# Patient Record
Sex: Male | Born: 1940 | Race: White | Hispanic: No | Marital: Single | Smoking: Current some day smoker
Health system: Southern US, Community
[De-identification: ages and names within clinical notes are randomized; demographics above are authoritative.]

## PROBLEM LIST (undated history)

## (undated) DIAGNOSIS — C3490 Malignant neoplasm of unspecified part of unspecified bronchus or lung: Secondary | ICD-10-CM

## (undated) DIAGNOSIS — C189 Malignant neoplasm of colon, unspecified: Secondary | ICD-10-CM

## (undated) DIAGNOSIS — I10 Essential (primary) hypertension: Secondary | ICD-10-CM

## (undated) DIAGNOSIS — E119 Type 2 diabetes mellitus without complications: Secondary | ICD-10-CM

## (undated) HISTORY — PX: COLOSTOMY: SHX63

## (undated) HISTORY — PX: JOINT REPLACEMENT: SHX530

---

## 2010-06-13 ENCOUNTER — Ambulatory Visit: Payer: Self-pay | Admitting: Family Medicine

## 2019-09-14 ENCOUNTER — Inpatient Hospital Stay
Admission: EM | Admit: 2019-09-14 | Discharge: 2019-09-18 | DRG: 481 | Disposition: A | Discharge status: Skilled Nursing Facility | Payer: Medicare Other | Attending: Internal Medicine | Admitting: Internal Medicine

## 2019-09-14 ENCOUNTER — Emergency Department: Payer: Medicare Other

## 2019-09-14 ENCOUNTER — Other Ambulatory Visit: Payer: Self-pay

## 2019-09-14 DIAGNOSIS — E876 Hypokalemia: Secondary | ICD-10-CM | POA: Diagnosis present

## 2019-09-14 DIAGNOSIS — Z20822 Contact with and (suspected) exposure to covid-19: Secondary | ICD-10-CM | POA: Diagnosis present

## 2019-09-14 DIAGNOSIS — N182 Chronic kidney disease, stage 2 (mild): Secondary | ICD-10-CM | POA: Diagnosis present

## 2019-09-14 DIAGNOSIS — W1831XA Fall on same level due to stepping on an object, initial encounter: Secondary | ICD-10-CM | POA: Diagnosis present

## 2019-09-14 DIAGNOSIS — I440 Atrioventricular block, first degree: Secondary | ICD-10-CM | POA: Diagnosis present

## 2019-09-14 DIAGNOSIS — Z9181 History of falling: Secondary | ICD-10-CM

## 2019-09-14 DIAGNOSIS — S72142A Displaced intertrochanteric fracture of left femur, initial encounter for closed fracture: Secondary | ICD-10-CM

## 2019-09-14 DIAGNOSIS — S72009A Fracture of unspecified part of neck of unspecified femur, initial encounter for closed fracture: Secondary | ICD-10-CM

## 2019-09-14 DIAGNOSIS — E119 Type 2 diabetes mellitus without complications: Secondary | ICD-10-CM | POA: Diagnosis not present

## 2019-09-14 DIAGNOSIS — Z96653 Presence of artificial knee joint, bilateral: Secondary | ICD-10-CM | POA: Diagnosis present

## 2019-09-14 DIAGNOSIS — T465X6A Underdosing of other antihypertensive drugs, initial encounter: Secondary | ICD-10-CM | POA: Diagnosis present

## 2019-09-14 DIAGNOSIS — W010XXA Fall on same level from slipping, tripping and stumbling without subsequent striking against object, initial encounter: Secondary | ICD-10-CM | POA: Diagnosis present

## 2019-09-14 DIAGNOSIS — F17200 Nicotine dependence, unspecified, uncomplicated: Secondary | ICD-10-CM | POA: Diagnosis present

## 2019-09-14 DIAGNOSIS — N179 Acute kidney failure, unspecified: Secondary | ICD-10-CM | POA: Diagnosis present

## 2019-09-14 DIAGNOSIS — F1020 Alcohol dependence, uncomplicated: Secondary | ICD-10-CM | POA: Diagnosis present

## 2019-09-14 DIAGNOSIS — Z91138 Patient's unintentional underdosing of medication regimen for other reason: Secondary | ICD-10-CM

## 2019-09-14 DIAGNOSIS — Y92019 Unspecified place in single-family (private) house as the place of occurrence of the external cause: Secondary | ICD-10-CM | POA: Diagnosis not present

## 2019-09-14 DIAGNOSIS — Z96641 Presence of right artificial hip joint: Secondary | ICD-10-CM | POA: Diagnosis present

## 2019-09-14 DIAGNOSIS — I10 Essential (primary) hypertension: Secondary | ICD-10-CM | POA: Diagnosis not present

## 2019-09-14 DIAGNOSIS — N1831 Chronic kidney disease, stage 3a: Secondary | ICD-10-CM | POA: Diagnosis present

## 2019-09-14 DIAGNOSIS — D7589 Other specified diseases of blood and blood-forming organs: Secondary | ICD-10-CM | POA: Diagnosis present

## 2019-09-14 DIAGNOSIS — Y9389 Activity, other specified: Secondary | ICD-10-CM | POA: Diagnosis not present

## 2019-09-14 DIAGNOSIS — S72002A Fracture of unspecified part of neck of left femur, initial encounter for closed fracture: Secondary | ICD-10-CM | POA: Diagnosis present

## 2019-09-14 DIAGNOSIS — D696 Thrombocytopenia, unspecified: Secondary | ICD-10-CM | POA: Diagnosis present

## 2019-09-14 DIAGNOSIS — Z419 Encounter for procedure for purposes other than remedying health state, unspecified: Secondary | ICD-10-CM

## 2019-09-14 DIAGNOSIS — I129 Hypertensive chronic kidney disease with stage 1 through stage 4 chronic kidney disease, or unspecified chronic kidney disease: Secondary | ICD-10-CM | POA: Diagnosis present

## 2019-09-14 DIAGNOSIS — Z833 Family history of diabetes mellitus: Secondary | ICD-10-CM

## 2019-09-14 DIAGNOSIS — E1122 Type 2 diabetes mellitus with diabetic chronic kidney disease: Secondary | ICD-10-CM | POA: Diagnosis present

## 2019-09-14 DIAGNOSIS — W19XXXA Unspecified fall, initial encounter: Secondary | ICD-10-CM

## 2019-09-14 HISTORY — DX: Type 2 diabetes mellitus without complications: E11.9

## 2019-09-14 HISTORY — DX: Essential (primary) hypertension: I10

## 2019-09-14 LAB — COMPREHENSIVE METABOLIC PANEL
ALT: 17 U/L (ref 0–44)
AST: 29 U/L (ref 15–41)
Albumin: 3.5 g/dL (ref 3.5–5.0)
Alkaline Phosphatase: 71 U/L (ref 38–126)
Anion gap: 13 (ref 5–15)
BUN: 10 mg/dL (ref 8–23)
CO2: 28 mmol/L (ref 22–32)
Calcium: 8.3 mg/dL — ABNORMAL LOW (ref 8.9–10.3)
Chloride: 98 mmol/L (ref 98–111)
Creatinine, Ser: 1.41 mg/dL — ABNORMAL HIGH (ref 0.61–1.24)
GFR calc Af Amer: 55 mL/min — ABNORMAL LOW (ref >60)
GFR calc non Af Amer: 47 mL/min — ABNORMAL LOW (ref >60)
Glucose, Bld: 194 mg/dL — ABNORMAL HIGH (ref 70–99)
Potassium: 2.6 mmol/L — CL (ref 3.5–5.1)
Sodium: 139 mmol/L (ref 135–145)
Total Bilirubin: 0.8 mg/dL (ref 0.3–1.2)
Total Protein: 7.1 g/dL (ref 6.5–8.1)

## 2019-09-14 LAB — CBC WITH DIFFERENTIAL/PLATELET
Abs Immature Granulocytes: 0.02 10*3/uL (ref 0.00–0.07)
Basophils Absolute: 0.1 10*3/uL (ref 0.0–0.1)
Basophils Relative: 1 %
Eosinophils Absolute: 0.2 10*3/uL (ref 0.0–0.5)
Eosinophils Relative: 3 %
HCT: 39.4 % (ref 39.0–52.0)
Hemoglobin: 13.6 g/dL (ref 13.0–17.0)
Immature Granulocytes: 0 %
Lymphocytes Relative: 21 %
Lymphs Abs: 1.1 10*3/uL (ref 0.7–4.0)
MCH: 35.1 pg — ABNORMAL HIGH (ref 26.0–34.0)
MCHC: 34.5 g/dL (ref 30.0–36.0)
MCV: 101.8 fL — ABNORMAL HIGH (ref 80.0–100.0)
Monocytes Absolute: 0.5 10*3/uL (ref 0.1–1.0)
Monocytes Relative: 9 %
Neutro Abs: 3.6 10*3/uL (ref 1.7–7.7)
Neutrophils Relative %: 66 %
Platelets: 142 10*3/uL — ABNORMAL LOW (ref 150–400)
RBC: 3.87 MIL/uL — ABNORMAL LOW (ref 4.22–5.81)
RDW: 14.2 % (ref 11.5–15.5)
WBC: 5.5 10*3/uL (ref 4.0–10.5)
nRBC: 0 % (ref 0.0–0.2)

## 2019-09-14 LAB — TYPE AND SCREEN
ABO/RH(D): A POS
Antibody Screen: NEGATIVE

## 2019-09-14 LAB — PROTIME-INR
INR: 1 (ref 0.8–1.2)
Prothrombin Time: 13.2 seconds (ref 11.4–15.2)

## 2019-09-14 LAB — SARS CORONAVIRUS 2 BY RT PCR (HOSPITAL ORDER, PERFORMED IN ~~LOC~~ HOSPITAL LAB): SARS Coronavirus 2: NEGATIVE

## 2019-09-14 IMAGING — DX DG CHEST 1V PORT
1 series · 1 of 1 positions shown · non-contrast
Comparison: None.

CLINICAL DATA: 79-year-old male status post fall at home. Left
femur fracture.

EXAM:
PORTABLE CHEST 1 VIEW

[chest ap]
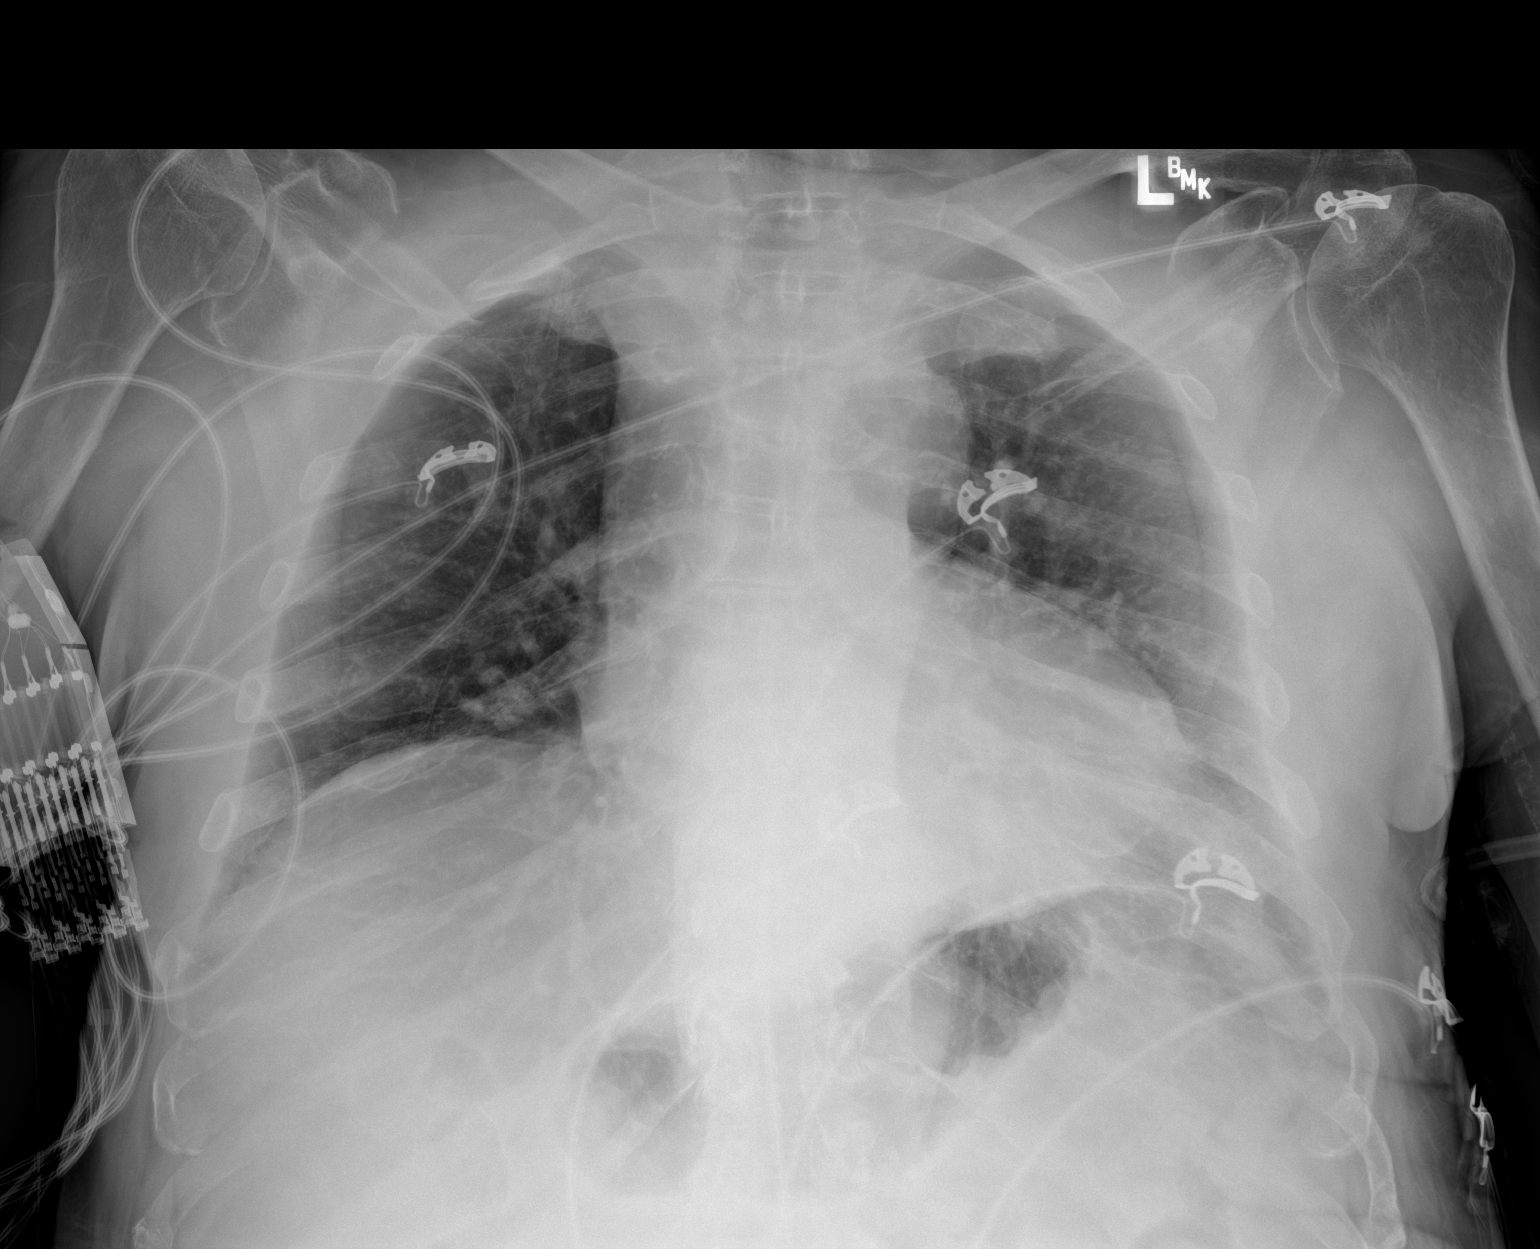

[1 of 1 positions shown; findings below may reference images not displayed]

FINDINGS: Portable AP semi upright view at [Q9] hours. Lordotic view. Mild
cardiomegaly. Mildly tortuous thoracic aorta. Visualized tracheal
air column is within normal limits. Allowing for portable technique
the lungs are clear. Negative visible bowel gas pattern. No acute
osseous abnormality identified.
IMPRESSION: Mild cardiomegaly.  No acute cardiopulmonary abnormality.

## 2019-09-14 IMAGING — DX DG HIP (WITH OR WITHOUT PELVIS) 2-3V*L*
4 series · 4 of 4 positions shown · non-contrast
Comparison: None.

CLINICAL DATA: 79-year-old male status post fall at home.

EXAM:
DG HIP (WITH OR WITHOUT PELVIS) 2-3V LEFT;
LEFT FEMUR 2 VIEWS

[pelvis ap]
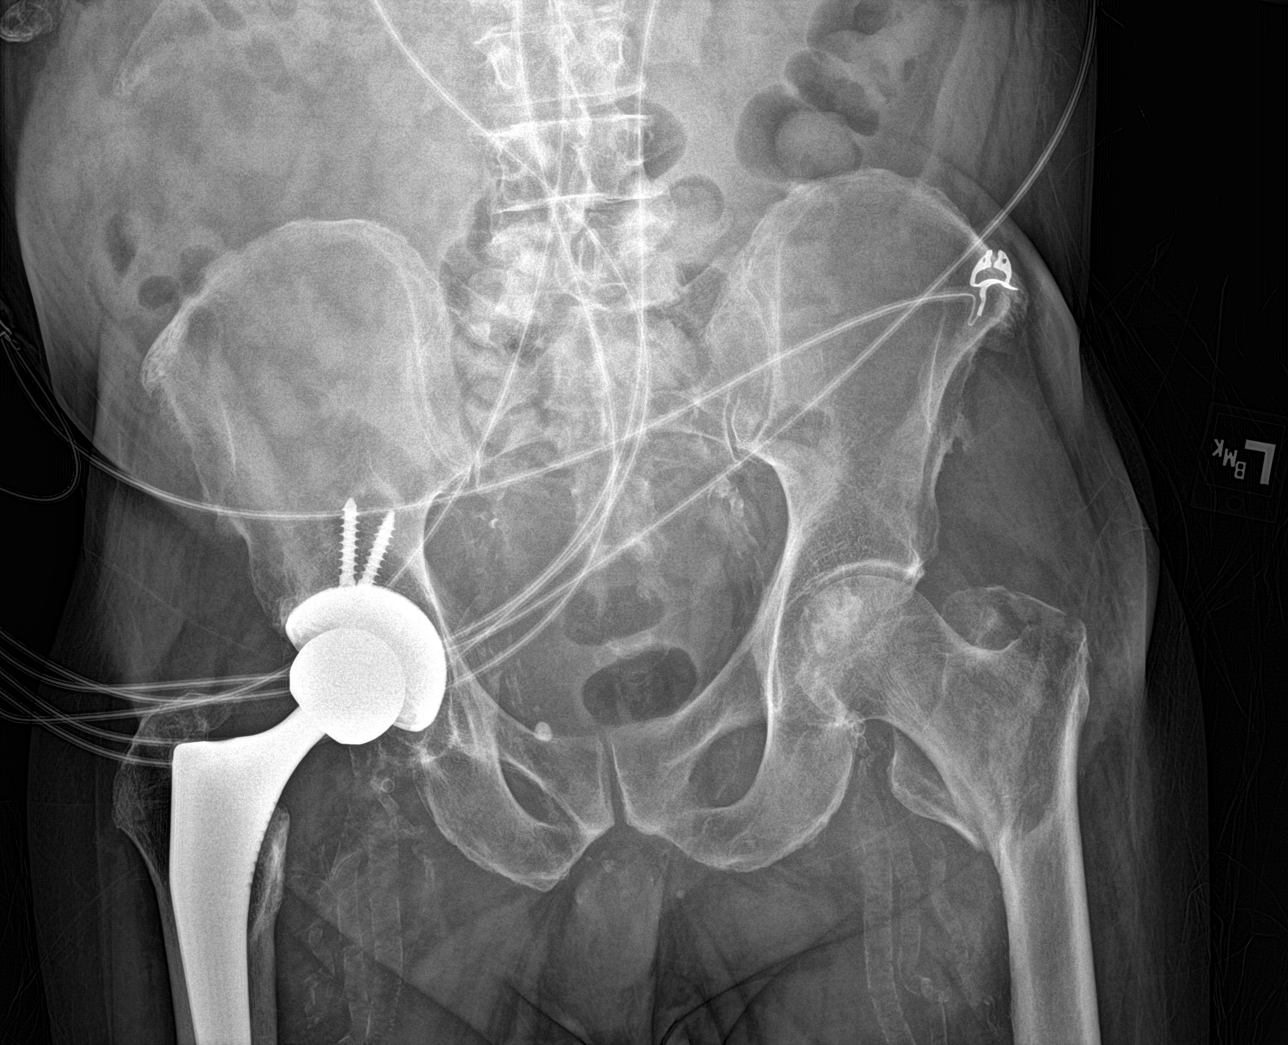

[hip ap]
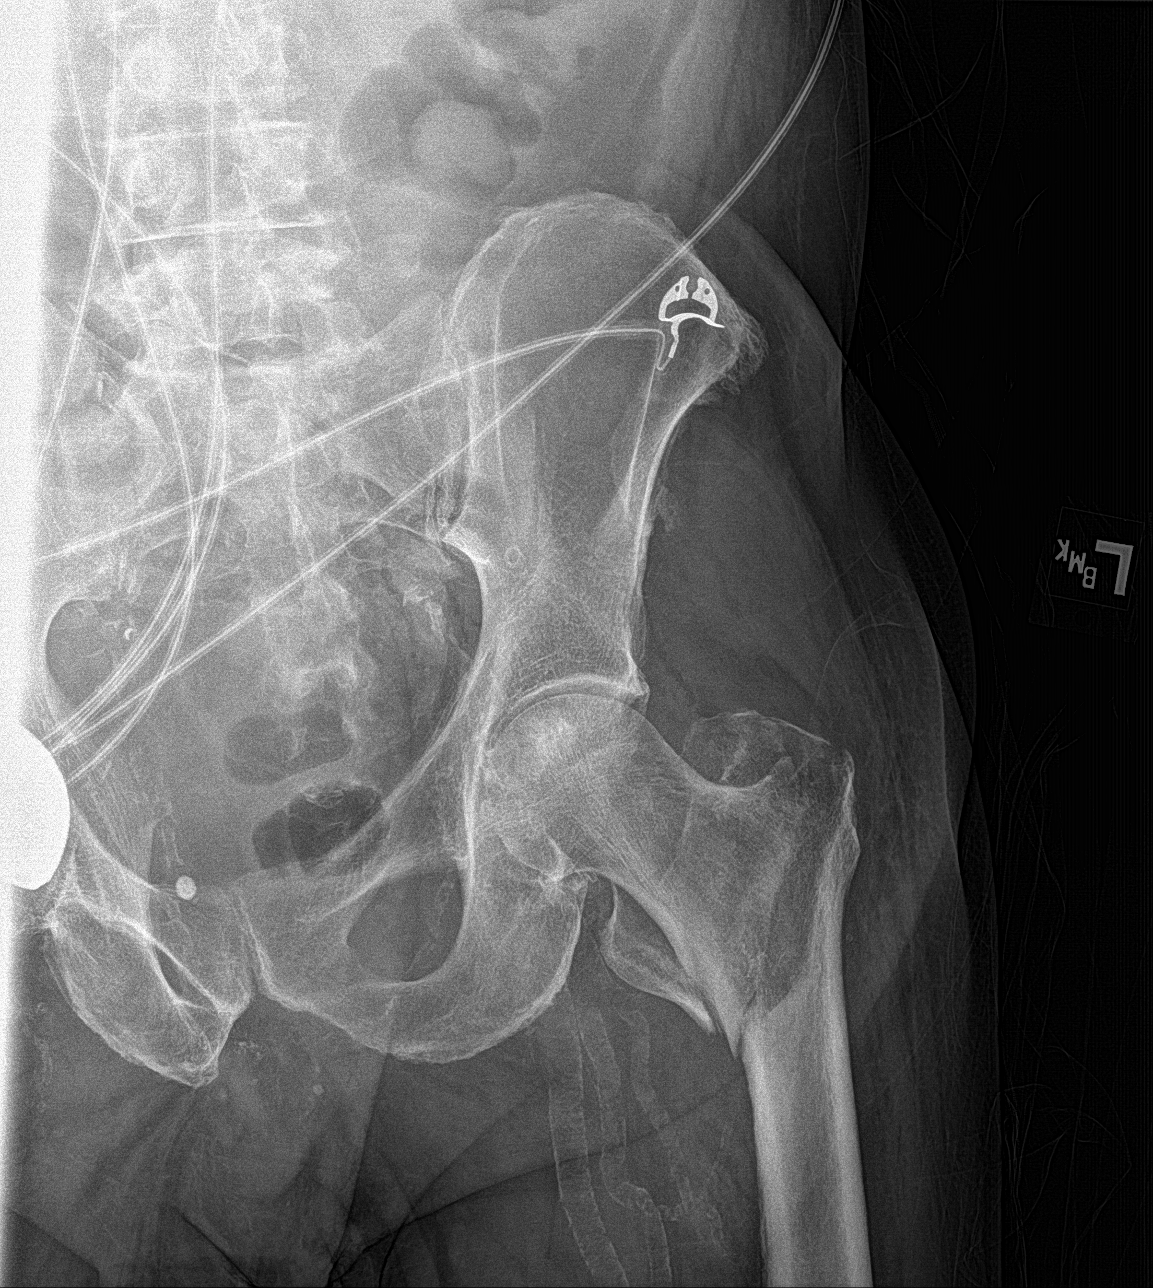

[femur lat]
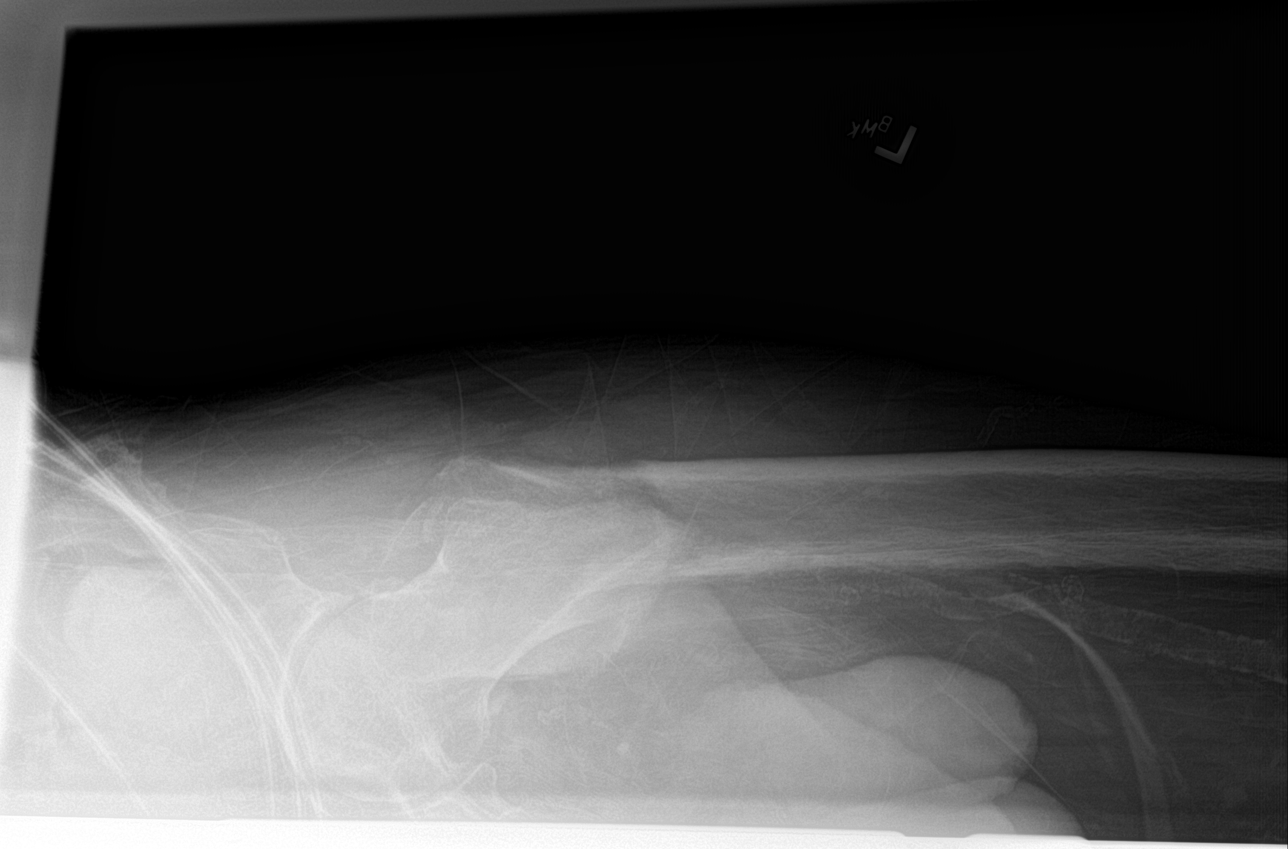

[femur ap]
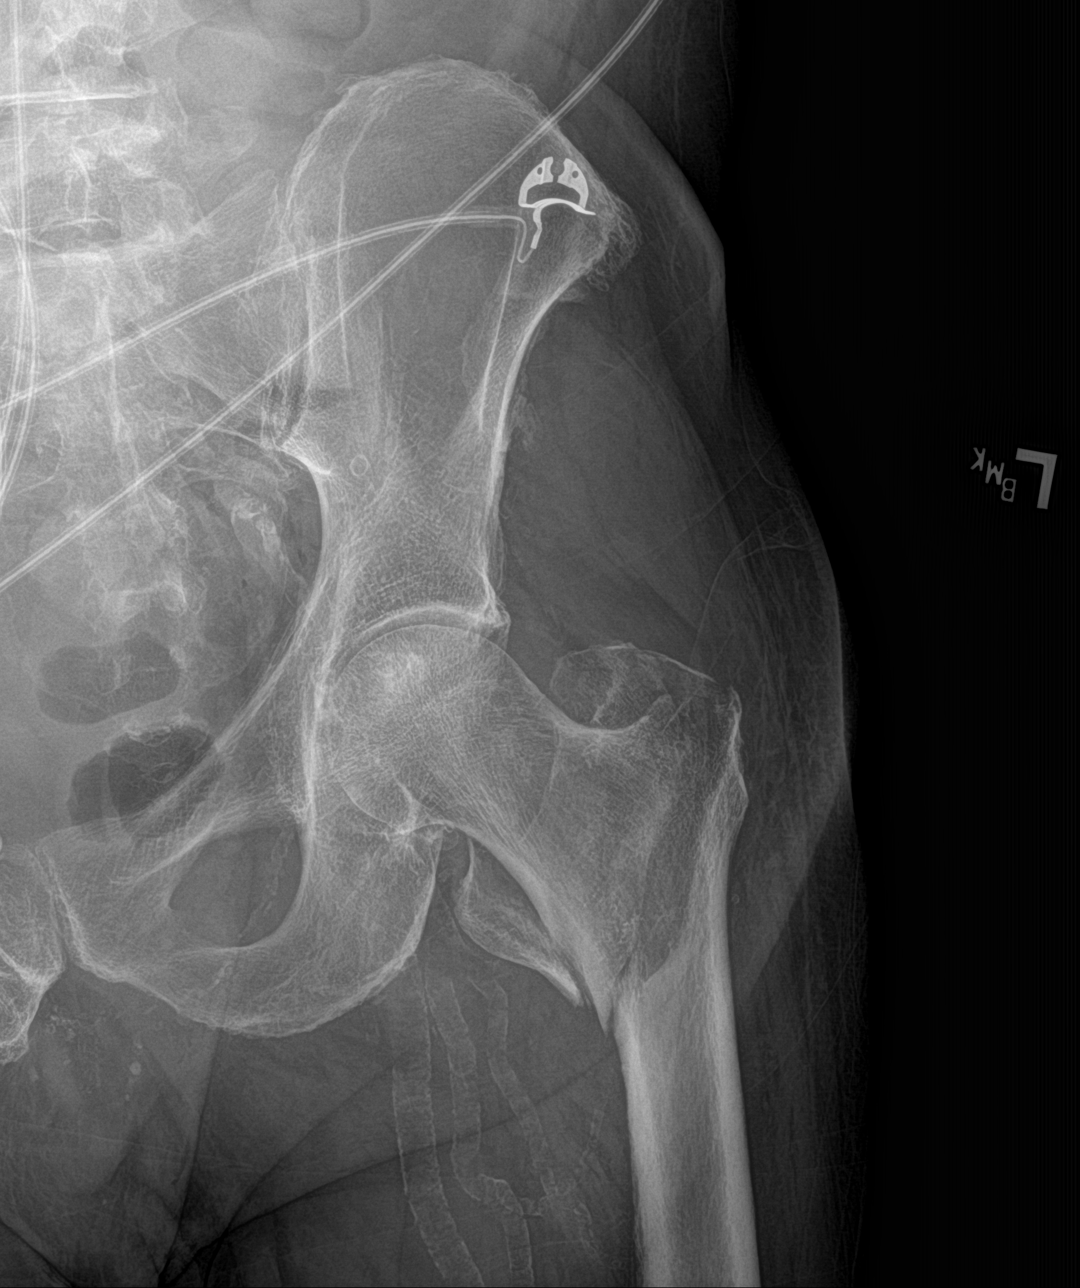

[4 of 4 positions shown; findings below may reference images not displayed]

FINDINGS: Left hip:

Comminuted left femur intertrochanteric fracture with mild varus
impaction. Left femoral head remains normally located.

Superimposed right total hip arthroplasty. No pelvis fracture
identified. Grossly intact visible proximal femur. Superimposed
iliofemoral calcified atherosclerosis. Negative visible lower
abdominal and pelvic visceral contours.

Left femur:

Distal to the intertrochanteric segment the left femur appears
intact. Superimposed left total knee arthroplasty appears aligned.
Calcified peripheral vascular disease in the left lower extremity.
IMPRESSION: 1. Comminuted left femur intertrochanteric fracture with mild varus
impaction.
2. No other acute fracture or dislocation identified about the left
femur or pelvis.
3. Right total hip arthroplasty, left knee arthroplasty.
4. Calcified atherosclerosis, peripheral vascular disease.

## 2019-09-14 IMAGING — DX DG FEMUR 2+V*L*
7 series · 7 of 7 positions shown · non-contrast
Comparison: None.

CLINICAL DATA: 79-year-old male status post fall at home.

EXAM:
DG HIP (WITH OR WITHOUT PELVIS) 2-3V LEFT;
LEFT FEMUR 2 VIEWS

[femur ap (1 of 3)]
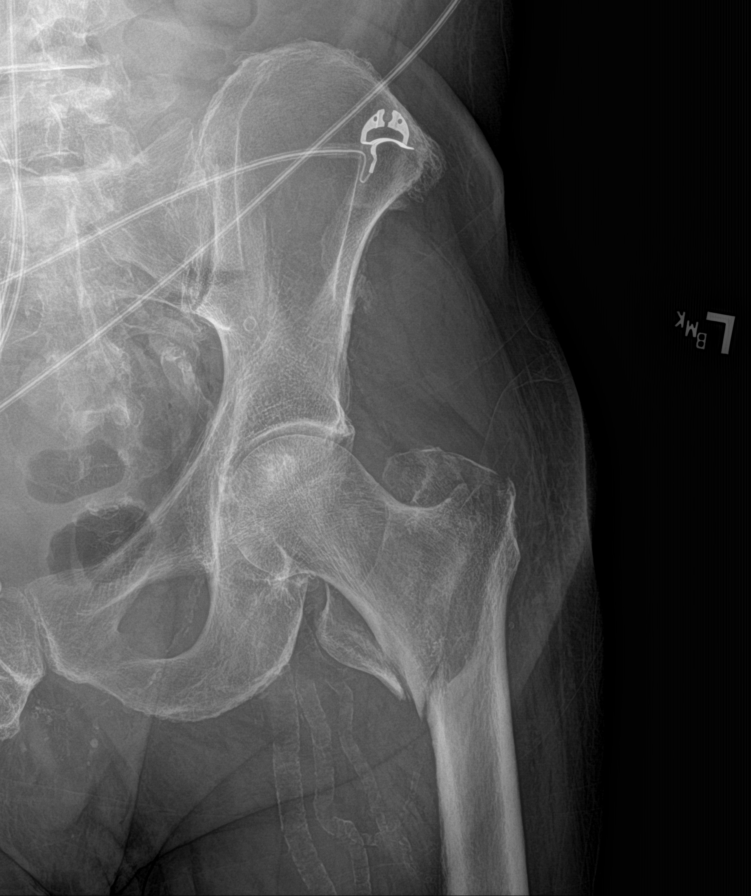

[femur ap (2 of 3)]
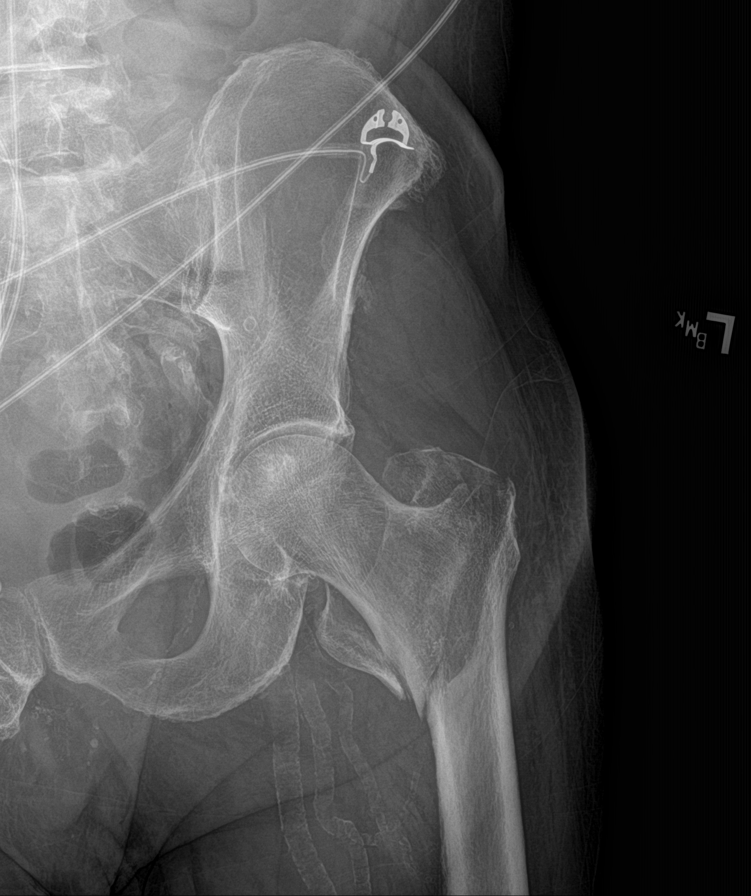

[femur ap (3 of 3)]
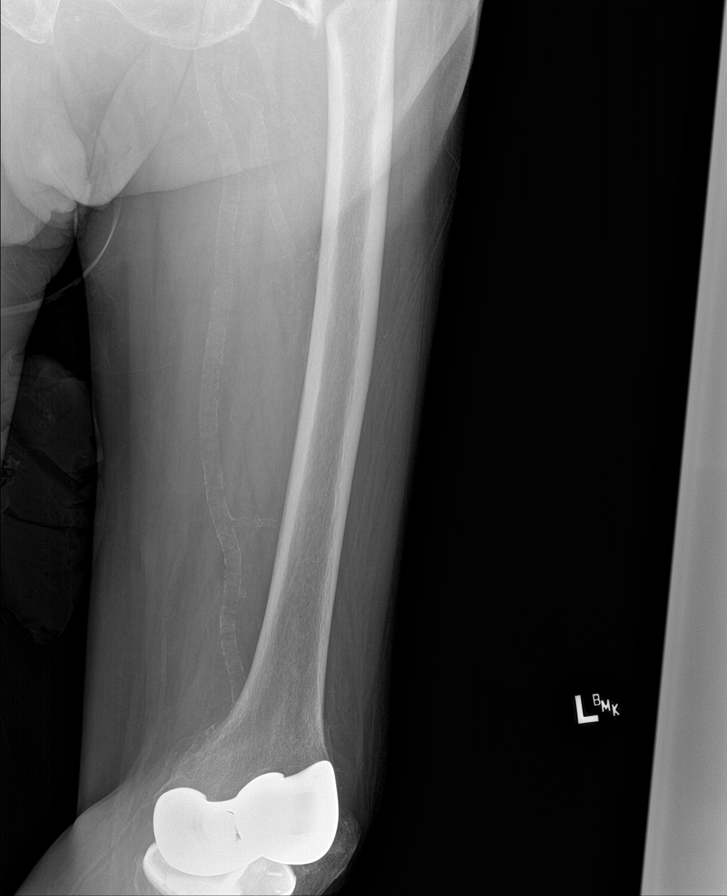

[femur lat (1 of 4)]
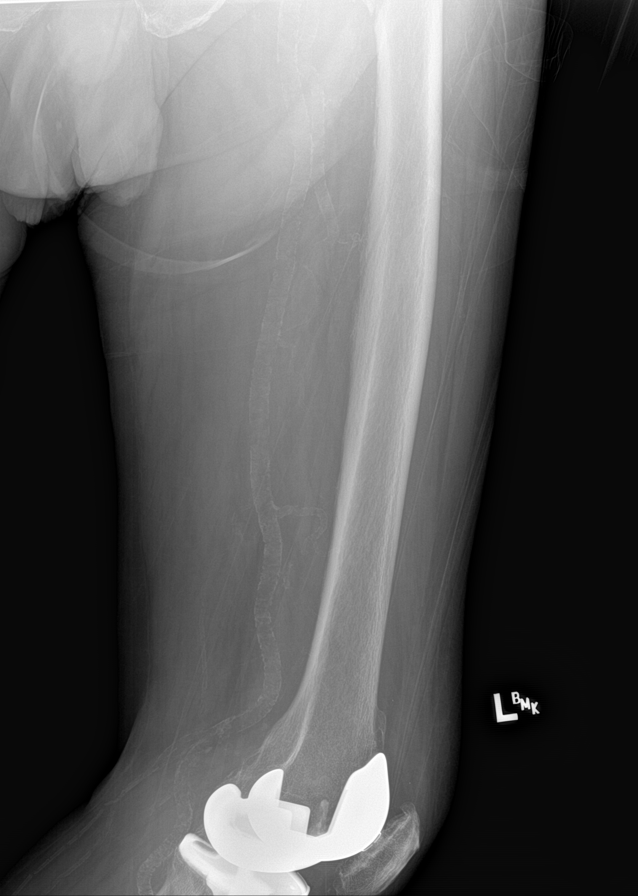

[femur lat (2 of 4)]
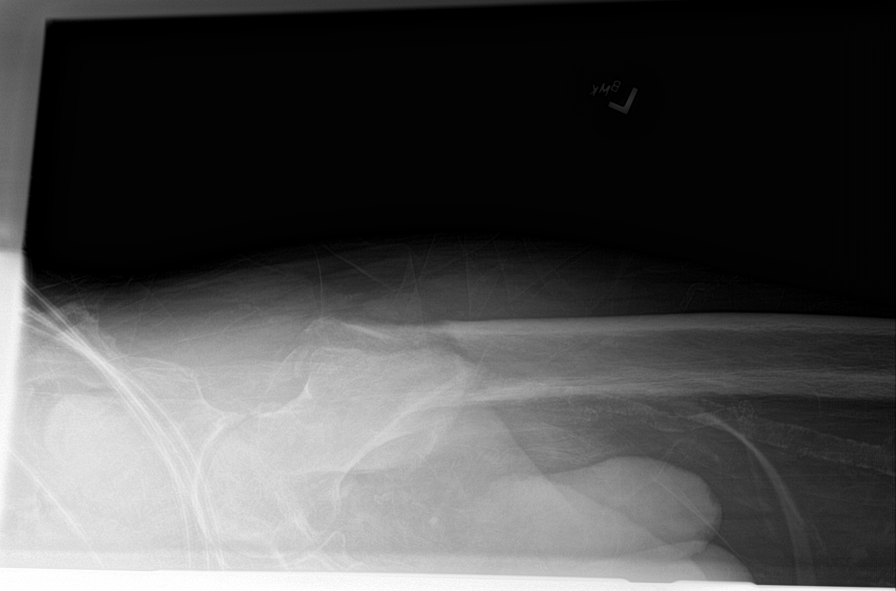

[femur lat (3 of 4)]
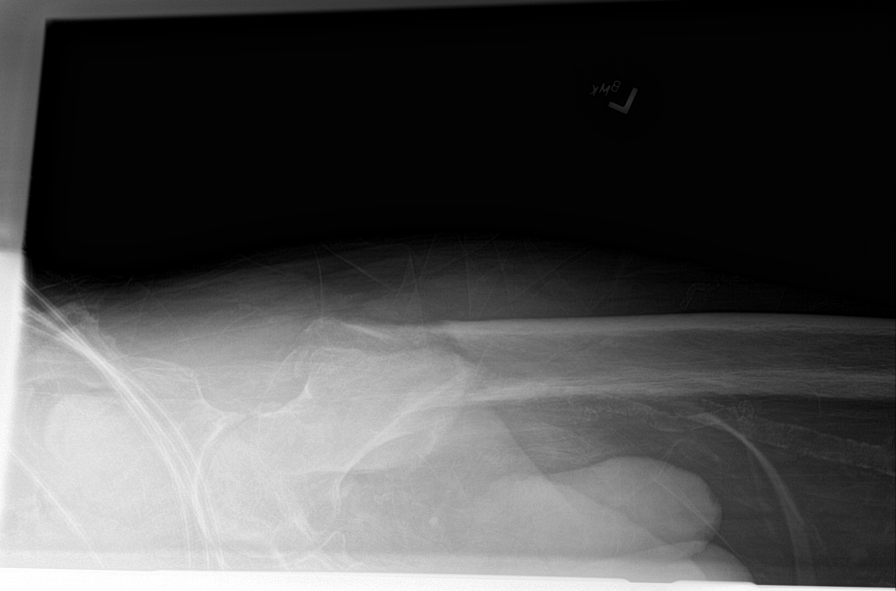

[femur lat (4 of 4)]
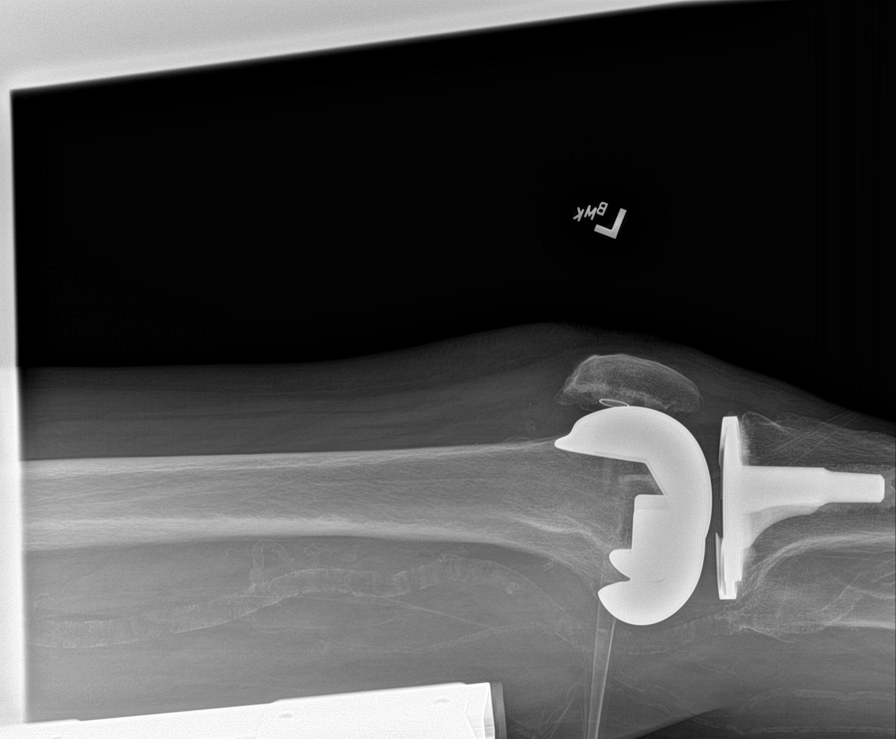

[7 of 7 positions shown; findings below may reference images not displayed]

FINDINGS: Left hip:

Comminuted left femur intertrochanteric fracture with mild varus
impaction. Left femoral head remains normally located.

Superimposed right total hip arthroplasty. No pelvis fracture
identified. Grossly intact visible proximal femur. Superimposed
iliofemoral calcified atherosclerosis. Negative visible lower
abdominal and pelvic visceral contours.

Left femur:

Distal to the intertrochanteric segment the left femur appears
intact. Superimposed left total knee arthroplasty appears aligned.
Calcified peripheral vascular disease in the left lower extremity.
IMPRESSION: 1. Comminuted left femur intertrochanteric fracture with mild varus
impaction.
2. No other acute fracture or dislocation identified about the left
femur or pelvis.
3. Right total hip arthroplasty, left knee arthroplasty.
4. Calcified atherosclerosis, peripheral vascular disease.

## 2019-09-14 MED ORDER — ACETAMINOPHEN 500 MG PO TABS
1000.0000 mg | ORAL_TABLET | Freq: Once | ORAL | Status: AC
  Administered 2019-09-14: 1000 mg via ORAL
  Filled 2019-09-14: qty 2

## 2019-09-14 MED ORDER — POTASSIUM CHLORIDE CRYS ER 20 MEQ PO TBCR
40.0000 meq | EXTENDED_RELEASE_TABLET | Freq: Once | ORAL | Status: AC
  Administered 2019-09-14: 40 meq via ORAL
  Filled 2019-09-14: qty 2

## 2019-09-14 MED ORDER — FENTANYL CITRATE (PF) 100 MCG/2ML IJ SOLN: 50.0000 ug | Freq: Once | INTRAMUSCULAR | Status: AC

## 2019-09-14 MED ORDER — ONDANSETRON HCL 4 MG/2ML IJ SOLN: 4.0000 mg | Freq: Once | INTRAMUSCULAR | Status: AC

## 2019-09-14 MED ORDER — FENTANYL CITRATE (PF) 100 MCG/2ML IJ SOLN
INTRAMUSCULAR | Status: AC
  Administered 2019-09-14: 50 ug via INTRAVENOUS
  Filled 2019-09-14: qty 2

## 2019-09-14 MED ORDER — THIAMINE HCL 100 MG/ML IJ SOLN
100.0000 mg | Freq: Every day | INTRAMUSCULAR | Status: DC
  Filled 2019-09-14: qty 2

## 2019-09-14 MED ORDER — ONDANSETRON HCL 4 MG/2ML IJ SOLN: 4.0000 mg | Freq: Four times a day (QID) | INTRAMUSCULAR | Status: DC | PRN

## 2019-09-14 MED ORDER — LORAZEPAM 1 MG PO TABS: 1.0000 mg | ORAL_TABLET | ORAL | Status: AC | PRN

## 2019-09-14 MED ORDER — LORAZEPAM 2 MG/ML IJ SOLN: 1.0000 mg | INTRAMUSCULAR | Status: AC | PRN

## 2019-09-14 MED ORDER — THIAMINE HCL 100 MG PO TABS
100.0000 mg | ORAL_TABLET | Freq: Every day | ORAL | Status: DC
  Administered 2019-09-15 – 2019-09-18 (×4): 100 mg via ORAL
  Filled 2019-09-14 (×4): qty 1

## 2019-09-14 MED ORDER — INSULIN ASPART 100 UNIT/ML ~~LOC~~ SOLN
0.0000 [IU] | SUBCUTANEOUS | Status: DC
  Administered 2019-09-15 (×2): 1 [IU] via SUBCUTANEOUS
  Administered 2019-09-15: 2 [IU] via SUBCUTANEOUS
  Administered 2019-09-16: 5 [IU] via SUBCUTANEOUS
  Administered 2019-09-16: 2 [IU] via SUBCUTANEOUS
  Administered 2019-09-16 (×2): 1 [IU] via SUBCUTANEOUS
  Administered 2019-09-16: 2 [IU] via SUBCUTANEOUS
  Administered 2019-09-16: 3 [IU] via SUBCUTANEOUS
  Administered 2019-09-17: 1 [IU] via SUBCUTANEOUS
  Administered 2019-09-17 (×2): 2 [IU] via SUBCUTANEOUS
  Administered 2019-09-17: 3 [IU] via SUBCUTANEOUS
  Filled 2019-09-14 (×13): qty 1

## 2019-09-14 MED ORDER — CEFAZOLIN SODIUM-DEXTROSE 1-4 GM/50ML-% IV SOLN
1.0000 g | Freq: Once | INTRAVENOUS | Status: AC
  Administered 2019-09-15: 2 g via INTRAVENOUS
  Filled 2019-09-14: qty 50

## 2019-09-14 MED ORDER — LABETALOL HCL 5 MG/ML IV SOLN: 10.0000 mg | INTRAVENOUS | Status: DC | PRN

## 2019-09-14 MED ORDER — FOLIC ACID 1 MG PO TABS
1.0000 mg | ORAL_TABLET | Freq: Every day | ORAL | Status: DC
  Administered 2019-09-15 – 2019-09-18 (×4): 1 mg via ORAL
  Filled 2019-09-14 (×4): qty 1

## 2019-09-14 MED ORDER — ADULT MULTIVITAMIN W/MINERALS CH
1.0000 | ORAL_TABLET | Freq: Every day | ORAL | Status: DC
  Administered 2019-09-15 – 2019-09-18 (×4): 1 via ORAL
  Filled 2019-09-14 (×4): qty 1

## 2019-09-14 MED ORDER — HYDROMORPHONE HCL 1 MG/ML IJ SOLN
1.0000 mg | Freq: Once | INTRAMUSCULAR | Status: AC
  Administered 2019-09-14: 1 mg via INTRAVENOUS
  Filled 2019-09-14: qty 1

## 2019-09-14 MED ORDER — SENNOSIDES-DOCUSATE SODIUM 8.6-50 MG PO TABS: 1.0000 | ORAL_TABLET | Freq: Every evening | ORAL | Status: DC | PRN

## 2019-09-14 MED ORDER — MAGNESIUM SULFATE IN D5W 1-5 GM/100ML-% IV SOLN
1.0000 g | Freq: Once | INTRAVENOUS | Status: AC
  Administered 2019-09-15: 1 g via INTRAVENOUS
  Filled 2019-09-14: qty 100

## 2019-09-14 MED ORDER — POTASSIUM CHLORIDE IN NACL 40-0.9 MEQ/L-% IV SOLN
INTRAVENOUS | Status: DC
  Filled 2019-09-14 (×2): qty 1000

## 2019-09-14 MED ORDER — LACTATED RINGERS IV BOLUS
1000.0000 mL | Freq: Once | INTRAVENOUS | Status: AC
  Administered 2019-09-14: 1000 mL via INTRAVENOUS

## 2019-09-14 MED ORDER — MORPHINE SULFATE (PF) 2 MG/ML IV SOLN
1.0000 mg | INTRAVENOUS | Status: DC | PRN
  Administered 2019-09-14 – 2019-09-15 (×2): 2 mg via INTRAVENOUS
  Administered 2019-09-15: 1 mg via INTRAVENOUS
  Administered 2019-09-15: 2 mg via INTRAVENOUS
  Filled 2019-09-14 (×5): qty 1

## 2019-09-14 MED ORDER — ONDANSETRON HCL 4 MG/2ML IJ SOLN
INTRAMUSCULAR | Status: AC
  Administered 2019-09-14: 4 mg via INTRAVENOUS
  Filled 2019-09-14: qty 2

## 2019-09-14 NOTE — ED Notes (Signed)
Patient reports he slipped while cleaning his dog and fell onto right hip. Patient c/o pain and tenderness to area. Leg appears shortened on assessment.   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   Patient reports hx of diabetes and hypertension. Patient reports he is supposed to take medications for his diabetes but is medication noncompliant.

## 2019-09-14 NOTE — ED Notes (Signed)
Transport team arrived

## 2019-09-14 NOTE — ED Triage Notes (Signed)
Pt with fall from home with left hip injury. Pt with shortening and rotation noted. Cms intact to foot.

## 2019-09-14 NOTE — ED Provider Notes (Signed)
[facility] Emergency Department Provider Note  ____________________________________________   First MD Initiated Contact with Patient 09/14/19 1944     (approximate)  I have reviewed the triage vital signs and the nursing notes.   HISTORY  Chief Complaint Fall   HPI Corey Gonzalez is a 79 y.o. male with PMH of HTN and DM who presents via EMS from home after tripping while cleaning one of his dogs and falling onto his left hip. Patient states this occurred immediately prior to arrival. He denies striking his head or any LOC. Describes tripping and does not recall any lightheadedness, dizziness, chest pain, cough, shortness of breath. Patient endorses severe pain in his left hip and denies any pain in his right lower extremity, left knee, left ankle, or upper extremities. No other recent injuries or falls. Aggravating factors for patient's pain include any palpation manipulation of the left lower extremity. No clear alleviating factors aside from fentanyl which she received via EMS. EMS reportedly gave 100 mics of fentanyl prior to arrival.         Past Medical History:  Diagnosis Date  . Diabetes mellitus without complication ([place])   . Hypertension     Patient Active Problem List   Diagnosis Date Noted  . Closed left hip fracture, initial encounter ([place]) 09/14/2019  . Diabetes mellitus without complication ([place])   . Chronic kidney disease, stage 3a   . Hypertension   . Alcohol dependence ([place])     Past Surgical History:  Procedure Laterality Date  . JOINT REPLACEMENT     bilateral knees  . JOINT REPLACEMENT Right    hip    Prior to Admission medications   Not on File    Allergies Patient has no known allergies.  Family History  Problem Relation Age of Onset  . Diabetes Other     Social History Social History   Tobacco Use  . Smoking status: Current Some Day Smoker  . Smokeless tobacco: Never Used  . Tobacco comment: ~5 per week   Substance Use Topics  . Alcohol use: Yes    Alcohol/week: 35.0 standard drinks    Types: 35 Standard drinks or equivalent per week    Comment: 3 mixed (liquor) drinks nightly  . Drug use: Not on file    Review of Systems  Review of Systems  Constitutional: Negative for chills and fever.  HENT: Negative for sore throat.   Eyes: Negative for pain.  Respiratory: Negative for cough and stridor.   Cardiovascular: Negative for chest pain.  Gastrointestinal: Positive for nausea and vomiting ( after fent from ems).  Musculoskeletal: Positive for falls, joint pain and myalgias.  Skin: Negative for rash.  Neurological: Negative for seizures, loss of consciousness and headaches.  Psychiatric/Behavioral: Negative for suicidal ideas.  All other systems reviewed and are negative.     ____________________________________________   PHYSICAL EXAM:  VITAL SIGNS: ED Triage Vitals  Enc Vitals Group     BP      Pulse      Resp      Temp      Temp src      SpO2      Weight      Height      Head Circumference      Peak Flow      Pain Score      Pain Loc      Pain Edu?      Excl. in [place]?    Vitals:  09/14/19 2215 09/14/19 2244  BP:  (!) 150/94  Pulse: 101 (!) 109  Resp: 18 18  Temp:  98.5 F (36.9 C)  SpO2: 100% 100%   Physical Exam Vitals and nursing note reviewed.  Constitutional:      Appearance: He is well-developed.  HENT:     Head: Normocephalic and atraumatic.     Right Ear: External ear normal.     Left Ear: External ear normal.     Nose: Nose normal.     Mouth/Throat:     Mouth: Mucous membranes are moist.  Eyes:     Conjunctiva/sclera: Conjunctivae normal.  Cardiovascular:     Rate and Rhythm: Normal rate and regular rhythm.     Heart sounds: No murmur heard.   Pulmonary:     Effort: Pulmonary effort is normal. No respiratory distress.     Breath sounds: Normal breath sounds.  Abdominal:     Palpations: Abdomen is soft.     Tenderness: There is no  abdominal tenderness.  Musculoskeletal:     Cervical back: Neck supple.     Right lower leg: No edema.     Left lower leg: No edema.  Skin:    General: Skin is warm and dry.  Neurological:     Mental Status: He is alert and oriented to person, place, and time.     Patient's left lower leg is externally rotated and shortened compared to the right. 2+ bilateral DP pulses. Patient is able to move his toes in his left foot. Sensation is intact light touch throughout the left lower extremity. Patient has full strength of the right lower extremity and the bilateral extremities. No tenderness over the C/T/L-spine. ____________________________________________   LABS (all labs ordered are listed, but only abnormal results are displayed)  Labs Reviewed  CBC WITH DIFFERENTIAL/PLATELET - Abnormal; Notable for the following components:      Result Value   RBC 3.87 (*)    MCV 101.8 (*)    MCH 35.1 (*)    Platelets 142 (*)    All other components within normal limits  COMPREHENSIVE METABOLIC PANEL - Abnormal; Notable for the following components:   Potassium 2.6 (*)    Glucose, Bld 194 (*)    Creatinine, Ser 1.41 (*)    Calcium 8.3 (*)    GFR calc non Af Amer 47 (*)    GFR calc Af Amer 55 (*)    All other components within normal limits  SARS CORONAVIRUS 2 BY RT PCR (HOSPITAL ORDER, [facility])  SURGICAL PCR SCREEN  PROTIME-INR  HEMOGLOBIN A1C  CBC  COMPREHENSIVE METABOLIC PANEL  MAGNESIUM  TYPE AND SCREEN   ____________________________________________  EKG  Sinus tachycardia with a ventricular rate of 101, Q waves in the inferior lateral leads with no recent to compare to without other evidence of acute ischemia or other significant underlying arrhythmia.  Unremarkable intervals. ____________________________________________  RADIOLOGY    Official radiology report(s): DG Chest Portable 1 View  Result Date: 09/14/2019 CLINICAL DATA:  79 year old male  status post fall at home. Left femur fracture. EXAM: PORTABLE CHEST 1 VIEW COMPARISON:  None. FINDINGS: Portable AP semi upright view at 2011 hours. Lordotic view. Mild cardiomegaly. Mildly tortuous thoracic aorta. Visualized tracheal air column is within normal limits. Allowing for portable technique the lungs are clear. Negative visible bowel gas pattern. No acute osseous abnormality identified. IMPRESSION: Mild cardiomegaly.  No acute cardiopulmonary abnormality. Electronically Signed   By: Genevie Ann  M.D.   On: 09/14/2019 20:28   DG Hip Unilat W or Wo Pelvis 2-3 Views Left  Result Date: 09/14/2019 CLINICAL DATA:  79 year old male status post fall at home. EXAM: DG HIP (WITH OR WITHOUT PELVIS) 2-3V LEFT; LEFT FEMUR 2 VIEWS COMPARISON:  None. FINDINGS: Left hip: Comminuted left femur intertrochanteric fracture with mild varus impaction. Left femoral head remains normally located. Superimposed right total hip arthroplasty. No pelvis fracture identified. Grossly intact visible proximal femur. Superimposed iliofemoral calcified atherosclerosis. Negative visible lower abdominal and pelvic visceral contours. Left femur: Distal to the intertrochanteric segment the left femur appears intact. Superimposed left total knee arthroplasty appears aligned. Calcified peripheral vascular disease in the left lower extremity. IMPRESSION: 1. Comminuted left femur intertrochanteric fracture with mild varus impaction. 2. No other acute fracture or dislocation identified about the left femur or pelvis. 3. Right total hip arthroplasty, left knee arthroplasty. 4. Calcified atherosclerosis, peripheral vascular disease. Electronically Signed   By: Genevie Ann M.D.   On: 09/14/2019 20:27   DG Femur Min 2 Views Left  Result Date: 09/14/2019 CLINICAL DATA:  79 year old male status post fall at home. EXAM: DG HIP (WITH OR WITHOUT PELVIS) 2-3V LEFT; LEFT FEMUR 2 VIEWS COMPARISON:  None. FINDINGS: Left hip: Comminuted left femur  intertrochanteric fracture with mild varus impaction. Left femoral head remains normally located. Superimposed right total hip arthroplasty. No pelvis fracture identified. Grossly intact visible proximal femur. Superimposed iliofemoral calcified atherosclerosis. Negative visible lower abdominal and pelvic visceral contours. Left femur: Distal to the intertrochanteric segment the left femur appears intact. Superimposed left total knee arthroplasty appears aligned. Calcified peripheral vascular disease in the left lower extremity. IMPRESSION: 1. Comminuted left femur intertrochanteric fracture with mild varus impaction. 2. No other acute fracture or dislocation identified about the left femur or pelvis. 3. Right total hip arthroplasty, left knee arthroplasty. 4. Calcified atherosclerosis, peripheral vascular disease. Electronically Signed   By: Genevie Ann M.D.   On: 09/14/2019 20:27    ____________________________________________   PROCEDURES  Procedure(s) performed (including Critical Care):  Procedures   ____________________________________________   INITIAL IMPRESSION / ASSESSMENT AND PLAN / ED COURSE        Patient presents with above-stated history exam for assessment of left hip pain after mechanical ground-level fall.  History and exam as above without evidence of distal neurovascular deficit.  No other evidence of traumatic injuries on history or above imaging.  Patient is noted to have a creatinine of 1.4 without a recent baseline and subsequently possible he is mildly dehydrated which is more likely given he is also hypokalemic.  Patient was given IV fluids, potassium, and analgesia.  Orthopedic service consulted who recommended hospitalist admission with plan for operative management tomorrow morning.  Given no other significant injuries or other metabolic derangements I do believe this is reasonable.  We will plan to admit to medicine service.  Medications  ceFAZolin (ANCEF) IVPB 1  g/50 mL premix (has no administration in time range)  LORazepam (ATIVAN) tablet 1-4 mg (has no administration in time range)    Or  LORazepam (ATIVAN) injection 1-4 mg (has no administration in time range)  thiamine tablet 100 mg (has no administration in time range)    Or  thiamine (B-1) injection 100 mg (has no administration in time range)  folic acid (FOLVITE) tablet 1 mg (has no administration in time range)  multivitamin with minerals tablet 1 tablet (has no administration in time range)  0.9 % NaCl with KCl 40 mEq / L  infusion (has no administration in time range)  magnesium sulfate IVPB 1 g 100 mL (has no administration in time range)  morphine 2 MG/ML injection 1-2 mg (has no administration in time range)  ondansetron (ZOFRAN) injection 4 mg (has no administration in time range)  insulin aspart (novoLOG) injection 0-9 Units (has no administration in time range)  senna-docusate (Senokot-S) tablet 1 tablet (has no administration in time range)  labetalol (NORMODYNE) injection 10 mg (has no administration in time range)  fentaNYL (SUBLIMAZE) injection 50 mcg (50 mcg Intravenous Given 09/14/19 1958)  ondansetron (ZOFRAN) injection 4 mg (4 mg Intravenous Given 09/14/19 1958)  acetaminophen (TYLENOL) tablet 1,000 mg (1,000 mg Oral Given 09/14/19 2103)  lactated ringers bolus 1,000 mL (1,000 mLs Intravenous Transfusing/Transfer 09/14/19 2214)  HYDROmorphone (DILAUDID) injection 1 mg (1 mg Intravenous Given 09/14/19 2124)  potassium chloride SA (KLOR-CON) CR tablet 40 mEq (40 mEq Oral Given 09/14/19 2124)             ____________________________________________   FINAL CLINICAL IMPRESSION(S) / ED DIAGNOSES  Final diagnoses:  Fall  Closed displaced intertrochanteric fracture of left femur, initial encounter ([place])  Hypokalemia  AKI (acute kidney injury) [facility])     ED Discharge Orders    None       Note:  This document was prepared using Dragon voice recognition software  and may include unintentional dictation errors.   Lucrezia Starch, MD 09/14/19 [phone]

## 2019-09-14 NOTE — Progress Notes (Signed)
Full consult note and discussion with patient to follow tomorrow AM.  Called by ED staff. Imaging reviewed.  - Plan for surgery tomorrow with IM nail. - NPO after midnight - Hold anticoagulation - Admit to Hospitalist team.

## 2019-09-14 NOTE — H&P (Signed)
History and Physical    CHRISTOPH COPELAN MHD:[id number] DOB: [birth year] DOA: 09/14/2019  PCP: Patient, No Pcp Per   Patient coming from: home   Chief Complaint: Fall with severe left hip pain and deformity   HPI: BRYCE CHEEVER is a 79 y.o. male with medical history significant for diabetes mellitus, hypertension, renal insufficiency, and alcohol dependence, now presenting to the emergency department with severe left hip pain after fall at home.  Patient reports that he was in his usual state of health and was cleaning his dog when he slipped and fell onto his left side without hitting his head or losing consciousness.  He was experiencing immediate and severe pain at the left hip and a deformity was noted.  EMS was called and the patient was treated with 100 mcg of fentanyl prior to arrival in the ED.  He denies any recent illness, reports that he is fairly active at baseline and never experiences chest pain with exertion.  He has not been short of breath, coughing, or experiencing any subjective fevers or chills.  He reports drinking 5 to 6 ounces of liquor every night but denies any history of withdrawal.  He reports being prescribed antihypertensives and diabetes medications in the past but has elected to not take any medications. He reports chronic right leg swelling that is unchanged.   ED Course: Upon arrival to the ED, patient is found to be afebrile, saturating well on room air, and with stable blood pressure.  EKG features sinus tachycardia with rate 101 and first-degree AV nodal block.  Chest x-ray demonstrates mild cardiomegaly without acute findings.  Radiographs of the hip and femur demonstrate comminuted left intertrochanteric femur fracture.  Chemistry panel is notable for glucose 194, potassium 2.6, and creatinine 1.41.  CBC with mild thrombocytopenia and a macrocytosis without anemia.  COVID-19 screening test is negative.  Orthopedic surgery was consulted by the ED physician and recommends  medical admission.  Patient was given a liter of IV fluids, fentanyl, acetaminophen, and oral potassium in the ED.  Review of Systems:  All other systems reviewed and apart from HPI, are negative.  Past Medical History:  Diagnosis Date  . Diabetes mellitus without complication ([place])   . Hypertension     Past Surgical History:  Procedure Laterality Date  . JOINT REPLACEMENT     bilateral knees  . JOINT REPLACEMENT Right    hip    Social History:   reports that he has been smoking. He has never used smokeless tobacco. He reports current alcohol use of about 35.0 standard drinks of alcohol per week. No history on file for drug use.  No Known Allergies  Family History  Problem Relation Age of Onset  . Diabetes Other      Prior to Admission medications   Not on File    Physical Exam: Vitals:   09/14/19 2130 09/14/19 2159 09/14/19 2200 09/14/19 2215  BP: (!) 132/77  (!) 139/80   Pulse: 97  103 101  Resp: 18  20 18   Temp:      TempSrc:      SpO2: 92% (!) 89% 90% 100%  Weight:      Height:        Constitutional: NAD, calm  Eyes: PERTLA, lids and conjunctivae normal ENMT: Mucous membranes are moist. Posterior pharynx clear of any exudate or lesions.   Neck: normal, supple, no masses, no thyromegaly Respiratory:  no wheezing, no crackles. No accessory muscle use.  Cardiovascular: S1 &  S2 heard, regular rate and rhythm. Mild edema involving right leg.   Abdomen: No distension, no tenderness, soft. Bowel sounds active.  Musculoskeletal: no clubbing / cyanosis. Left hip tender, left leg shortened and externally rotated, neurovascularly intact.   Skin: no significant rashes, lesions, ulcers. Warm, dry, well-perfused. Neurologic: CN 2-12 grossly intact. Sensation intact. Moving all extremities.  Psychiatric: Alert and oriented to person, place, and situation. Pleasant and cooperative.    Labs and Imaging on Admission: I have personally reviewed following labs and imaging  studies  CBC: Recent Labs  Lab 09/14/19 2002  WBC 5.5  NEUTROABS 3.6  HGB 13.6  HCT 39.4  MCV 101.8*  PLT 323*   Basic Metabolic Panel: Recent Labs  Lab 09/14/19 2002  NA 139  K 2.6*  CL 98  CO2 28  GLUCOSE 194*  BUN 10  CREATININE 1.41*  CALCIUM 8.3*   GFR: Estimated Creatinine Clearance: 41.8 mL/min (A) (by C-G formula based on SCr of 1.41 mg/dL (H)). Liver Function Tests: Recent Labs  Lab 09/14/19 2002  AST 29  ALT 17  ALKPHOS 71  BILITOT 0.8  PROT 7.1  ALBUMIN 3.5   No results for input(s): LIPASE, AMYLASE in the last 168 hours. No results for input(s): AMMONIA in the last 168 hours. Coagulation Profile: Recent Labs  Lab 09/14/19 2002  INR 1.0   Cardiac Enzymes: No results for input(s): CKTOTAL, CKMB, CKMBINDEX, TROPONINI in the last 168 hours. BNP (last 3 results) No results for input(s): PROBNP in the last 8760 hours. HbA1C: No results for input(s): HGBA1C in the last 72 hours. CBG: No results for input(s): GLUCAP in the last 168 hours. Lipid Profile: No results for input(s): CHOL, HDL, LDLCALC, TRIG, CHOLHDL, LDLDIRECT in the last 72 hours. Thyroid Function Tests: No results for input(s): TSH, T4TOTAL, FREET4, T3FREE, THYROIDAB in the last 72 hours. Anemia Panel: No results for input(s): VITAMINB12, FOLATE, FERRITIN, TIBC, IRON, RETICCTPCT in the last 72 hours. Urine analysis: No results found for: COLORURINE, APPEARANCEUR, LABSPEC, PHURINE, GLUCOSEU, HGBUR, BILIRUBINUR, KETONESUR, PROTEINUR, UROBILINOGEN, NITRITE, LEUKOCYTESUR Sepsis Labs: @LABRCNTIP (procalcitonin:4,lacticidven:4) ) Recent Results (from the past 240 hour(s))  SARS Coronavirus 2 by RT PCR (hospital order, performed in [facility] hospital lab) Nasopharyngeal Nasopharyngeal Swab     Status: None   Collection Time: 09/14/19  8:02 PM   Specimen: Nasopharyngeal Swab  Result Value Ref Range Status   SARS Coronavirus 2 NEGATIVE NEGATIVE Final    Comment: (NOTE) SARS-CoV-2  target nucleic acids are NOT DETECTED.  The SARS-CoV-2 RNA is generally detectable in upper and lower respiratory specimens during the acute phase of infection. The lowest concentration of SARS-CoV-2 viral copies this assay can detect is 250 copies / mL. A negative result does not preclude SARS-CoV-2 infection and should not be used as the sole basis for treatment or other patient management decisions.  A negative result may occur with improper specimen collection / handling, submission of specimen other than nasopharyngeal swab, presence of viral mutation(s) within the areas targeted by this assay, and inadequate number of viral copies (<250 copies / mL). A negative result must be combined with clinical observations, patient history, and epidemiological information.  Fact Sheet for Patients:   StrictlyIdeas.no  Fact Sheet for Healthcare Providers: BankingDealers.co.za  This test is not yet approved or  cleared by the [place] FDA and has been authorized for detection and/or diagnosis of SARS-CoV-2 by FDA under an Emergency Use Authorization (EUA).  This EUA will remain in effect (meaning this test  can be used) for the duration of the COVID-19 declaration under Section 564(b)(1) of the Act, 21 U.S.C. section 360bbb-3(b)(1), unless the authorization is terminated or revoked sooner.  Performed at [facility], [address], [place], [place] [address]      Radiological Exams on Admission: DG Chest Portable 1 View  Result Date: 09/14/2019 CLINICAL DATA:  79 year old male status post fall at home. Left femur fracture. EXAM: PORTABLE CHEST 1 VIEW COMPARISON:  None. FINDINGS: Portable AP semi upright view at 2011 hours. Lordotic view. Mild cardiomegaly. Mildly tortuous thoracic aorta. Visualized tracheal air column is within normal limits. Allowing for portable technique the lungs are clear. Negative visible bowel gas  pattern. No acute osseous abnormality identified. IMPRESSION: Mild cardiomegaly.  No acute cardiopulmonary abnormality. Electronically Signed   By: Genevie Ann M.D.   On: 09/14/2019 20:28   DG Hip Unilat W or Wo Pelvis 2-3 Views Left  Result Date: 09/14/2019 CLINICAL DATA:  79 year old male status post fall at home. EXAM: DG HIP (WITH OR WITHOUT PELVIS) 2-3V LEFT; LEFT FEMUR 2 VIEWS COMPARISON:  None. FINDINGS: Left hip: Comminuted left femur intertrochanteric fracture with mild varus impaction. Left femoral head remains normally located. Superimposed right total hip arthroplasty. No pelvis fracture identified. Grossly intact visible proximal femur. Superimposed iliofemoral calcified atherosclerosis. Negative visible lower abdominal and pelvic visceral contours. Left femur: Distal to the intertrochanteric segment the left femur appears intact. Superimposed left total knee arthroplasty appears aligned. Calcified peripheral vascular disease in the left lower extremity. IMPRESSION: 1. Comminuted left femur intertrochanteric fracture with mild varus impaction. 2. No other acute fracture or dislocation identified about the left femur or pelvis. 3. Right total hip arthroplasty, left knee arthroplasty. 4. Calcified atherosclerosis, peripheral vascular disease. Electronically Signed   By: Genevie Ann M.D.   On: 09/14/2019 20:27   DG Femur Min 2 Views Left  Result Date: 09/14/2019 CLINICAL DATA:  79 year old male status post fall at home. EXAM: DG HIP (WITH OR WITHOUT PELVIS) 2-3V LEFT; LEFT FEMUR 2 VIEWS COMPARISON:  None. FINDINGS: Left hip: Comminuted left femur intertrochanteric fracture with mild varus impaction. Left femoral head remains normally located. Superimposed right total hip arthroplasty. No pelvis fracture identified. Grossly intact visible proximal femur. Superimposed iliofemoral calcified atherosclerosis. Negative visible lower abdominal and pelvic visceral contours. Left femur: Distal to the  intertrochanteric segment the left femur appears intact. Superimposed left total knee arthroplasty appears aligned. Calcified peripheral vascular disease in the left lower extremity. IMPRESSION: 1. Comminuted left femur intertrochanteric fracture with mild varus impaction. 2. No other acute fracture or dislocation identified about the left femur or pelvis. 3. Right total hip arthroplasty, left knee arthroplasty. 4. Calcified atherosclerosis, peripheral vascular disease. Electronically Signed   By: Genevie Ann M.D.   On: 09/14/2019 20:27    EKG: Independently reviewed. Sinus tachycardia (rate 101), 1st degree AV block.   Assessment/Plan   1. Left hip fracture  - Presents with severe left hip pain and deformity after a slip and fall at home, and is found to have intertrochanteric femur fracture  - Orthopedic surgery is consulting and much appreciated  - Based on the available data, Mr. Gorr presents an estimated 0.6% risk of perioperative MI or cardiac arrest  - Continue pain-control, supportive care, NPO after midnight    2. Hypokalemia  - Serum potassium is 2.6 in ED  - He was given 40 mEq oral potassium in ED and KCl added to IVF, given 1 g magnesium empirically  - Repeat  chem panel in am    3. Type II DM  - Patient reports hx of DM but does not take any medications  - Serum glucose 194 in ED  - Check A1c, check CBGs, use low-intensity SSI    4. Hypertension  - Patient reports being prescribed antihypertensives but does not take any medications  - Treat as-needed only for now   5. Alcohol dependence  - Patient reports drinking 5-6 oz liquor every night, denies hx of withdrawal  - Monitor with CIWA, use Ativan as needed, supplement vitamins    6. Renal insufficiency  - SCr is 1.41 in ED  - Patient reports hx of CKD but unsure of baseline and no prior labs available  - Renally-dose medications, monitor    DVT prophylaxis: SCDs Code Status: Full  Family Communication: Discussed with  patient Disposition Plan:  Patient is from: Home  Anticipated d/c is to: TBD Anticipated d/c date is: 09/17/19 Patient currently: Pending surgical consultation and likely operative repair of hip fracture  Consults called: Orthopedic surgery consulted by ED physician  Admission status: Inpatient     Vianne Bulls, MD [facility]  09/14/2019, 10:29 PM

## 2019-09-14 NOTE — ED Notes (Signed)
Patient's oxygen saturation level 88% on RA post dilaudid administration. Patient placed on 2L [place]. Patient's oxygen saturation level increased to 99% on 2L [place]. RN will continue to monitor.

## 2019-09-14 NOTE — ED Triage Notes (Signed)
Ems gave 151mcg of fentanyl

## 2019-09-15 ENCOUNTER — Inpatient Hospital Stay: Payer: Medicare Other

## 2019-09-15 ENCOUNTER — Inpatient Hospital Stay: Payer: Medicare Other | Admitting: Anesthesiology

## 2019-09-15 ENCOUNTER — Encounter: Payer: Self-pay | Admitting: Orthopedic Surgery

## 2019-09-15 ENCOUNTER — Encounter
Admission: EM | Disposition: A | Discharge status: Skilled Nursing Facility | Payer: Self-pay | Source: Home / Self Care | Attending: Internal Medicine

## 2019-09-15 HISTORY — PX: INTRAMEDULLARY (IM) NAIL INTERTROCHANTERIC: SHX5875

## 2019-09-15 LAB — COMPREHENSIVE METABOLIC PANEL
ALT: 15 U/L (ref 0–44)
AST: 25 U/L (ref 15–41)
Albumin: 3.2 g/dL — ABNORMAL LOW (ref 3.5–5.0)
Alkaline Phosphatase: 65 U/L (ref 38–126)
Anion gap: 12 (ref 5–15)
BUN: 8 mg/dL (ref 8–23)
CO2: 29 mmol/L (ref 22–32)
Calcium: 8.2 mg/dL — ABNORMAL LOW (ref 8.9–10.3)
Chloride: 100 mmol/L (ref 98–111)
Creatinine, Ser: 1.22 mg/dL (ref 0.61–1.24)
GFR calc Af Amer: >60 mL/min (ref >60)
GFR calc non Af Amer: 56 mL/min — ABNORMAL LOW (ref >60)
Glucose, Bld: 161 mg/dL — ABNORMAL HIGH (ref 70–99)
Potassium: 3.2 mmol/L — ABNORMAL LOW (ref 3.5–5.1)
Sodium: 141 mmol/L (ref 135–145)
Total Bilirubin: 0.9 mg/dL (ref 0.3–1.2)
Total Protein: 6.5 g/dL (ref 6.5–8.1)

## 2019-09-15 LAB — BASIC METABOLIC PANEL
Anion gap: 13 (ref 5–15)
BUN: 10 mg/dL (ref 8–23)
CO2: 27 mmol/L (ref 22–32)
Calcium: 8.1 mg/dL — ABNORMAL LOW (ref 8.9–10.3)
Chloride: 102 mmol/L (ref 98–111)
Creatinine, Ser: 1.26 mg/dL — ABNORMAL HIGH (ref 0.61–1.24)
GFR calc Af Amer: >60 mL/min (ref >60)
GFR calc non Af Amer: 54 mL/min — ABNORMAL LOW (ref >60)
Glucose, Bld: 176 mg/dL — ABNORMAL HIGH (ref 70–99)
Potassium: 4 mmol/L (ref 3.5–5.1)
Sodium: 142 mmol/L (ref 135–145)

## 2019-09-15 LAB — CBC
HCT: 36.4 % — ABNORMAL LOW (ref 39.0–52.0)
Hemoglobin: 12.6 g/dL — ABNORMAL LOW (ref 13.0–17.0)
MCH: 35.3 pg — ABNORMAL HIGH (ref 26.0–34.0)
MCHC: 34.6 g/dL (ref 30.0–36.0)
MCV: 102 fL — ABNORMAL HIGH (ref 80.0–100.0)
Platelets: 130 10*3/uL — ABNORMAL LOW (ref 150–400)
RBC: 3.57 MIL/uL — ABNORMAL LOW (ref 4.22–5.81)
RDW: 14 % (ref 11.5–15.5)
WBC: 7.2 10*3/uL (ref 4.0–10.5)
nRBC: 0 % (ref 0.0–0.2)

## 2019-09-15 LAB — GLUCOSE, CAPILLARY
Glucose-Capillary: 130 mg/dL — ABNORMAL HIGH (ref 70–99)
Glucose-Capillary: 140 mg/dL — ABNORMAL HIGH (ref 70–99)
Glucose-Capillary: 145 mg/dL — ABNORMAL HIGH (ref 70–99)
Glucose-Capillary: 160 mg/dL — ABNORMAL HIGH (ref 70–99)

## 2019-09-15 LAB — MAGNESIUM: Magnesium: 1.8 mg/dL (ref 1.7–2.4)

## 2019-09-15 LAB — HEMOGLOBIN A1C
Hgb A1c MFr Bld: 7.5 % — ABNORMAL HIGH (ref 4.8–5.6)
Mean Plasma Glucose: 168.55 mg/dL

## 2019-09-15 LAB — SURGICAL PCR SCREEN
MRSA, PCR: NEGATIVE
Staphylococcus aureus: NEGATIVE

## 2019-09-15 IMAGING — RF DG HIP (WITH PELVIS) OPERATIVE*L*
1 series · 7 of 7 positions shown · non-contrast
Comparison: Radiographs yesterday

CLINICAL DATA: Left hip IM nail.

EXAM:
OPERATIVE LEFT HIP (WITH PELVIS IF PERFORMED)
TECHNIQUE: Fluoroscopic spot image(s) were submitted for interpretation
post-operatively.

[Series 1: run · 7 of 7 slices shown]
[im 1/7]
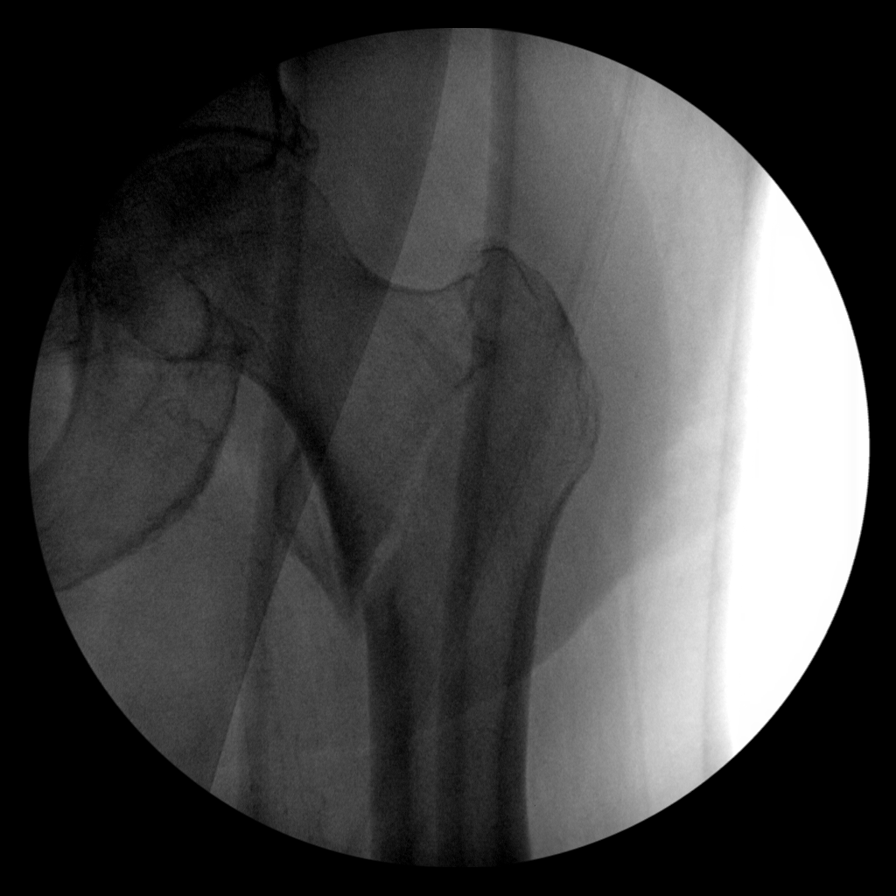
[im 2/7]
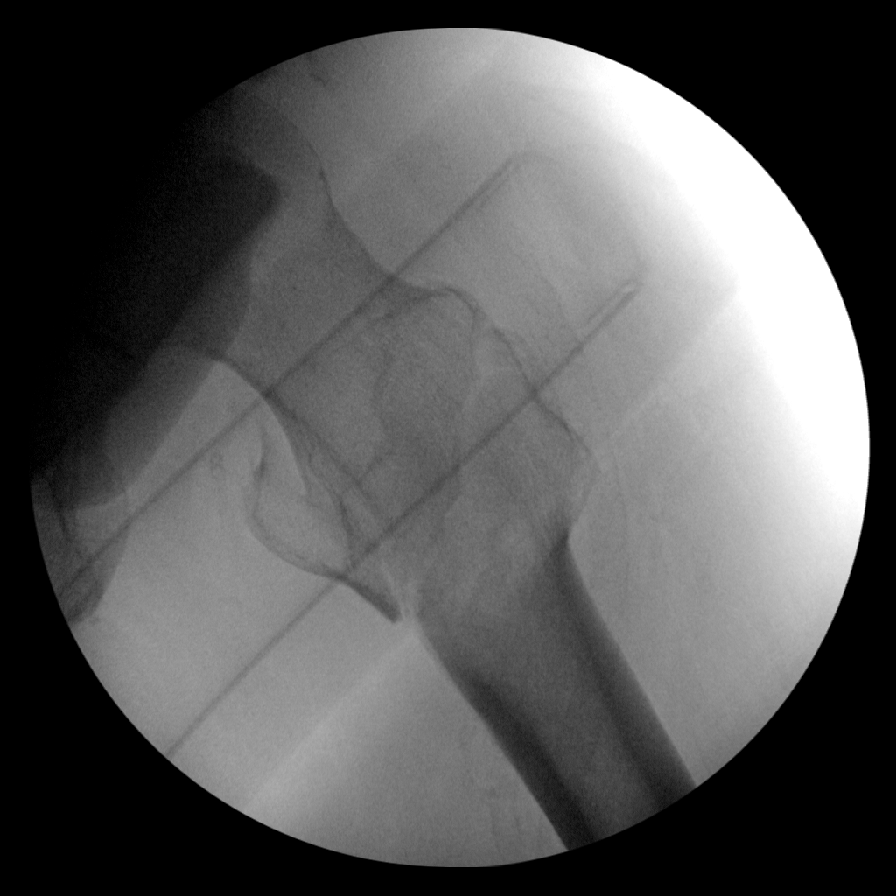
[im 3/7]
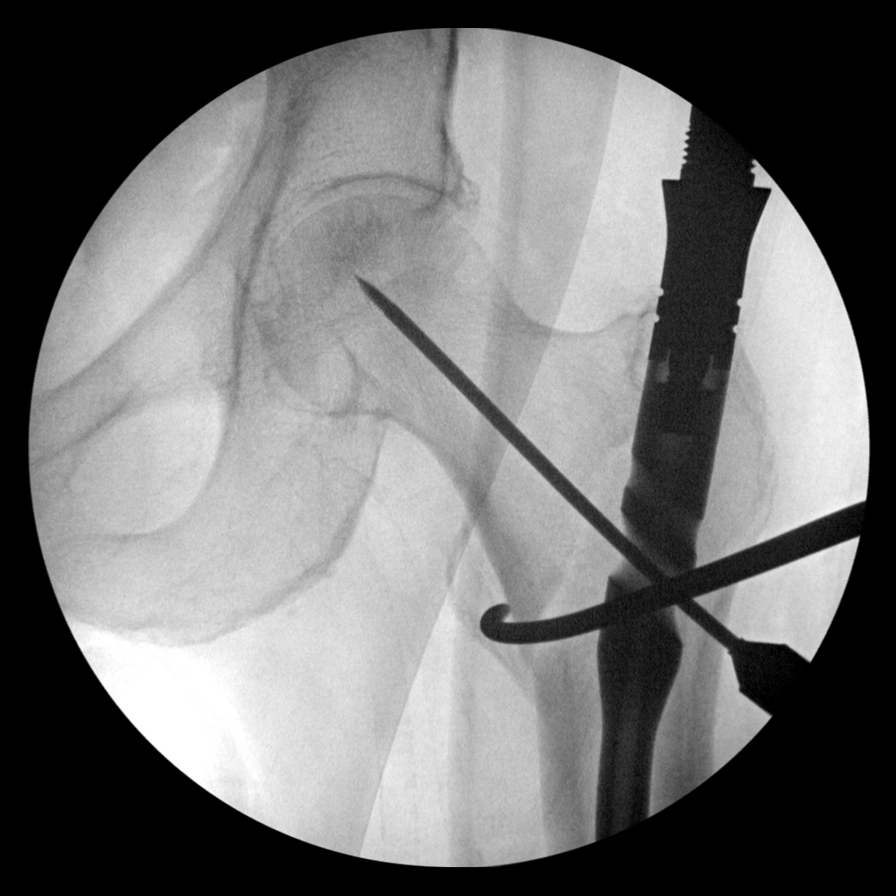
[im 4/7]
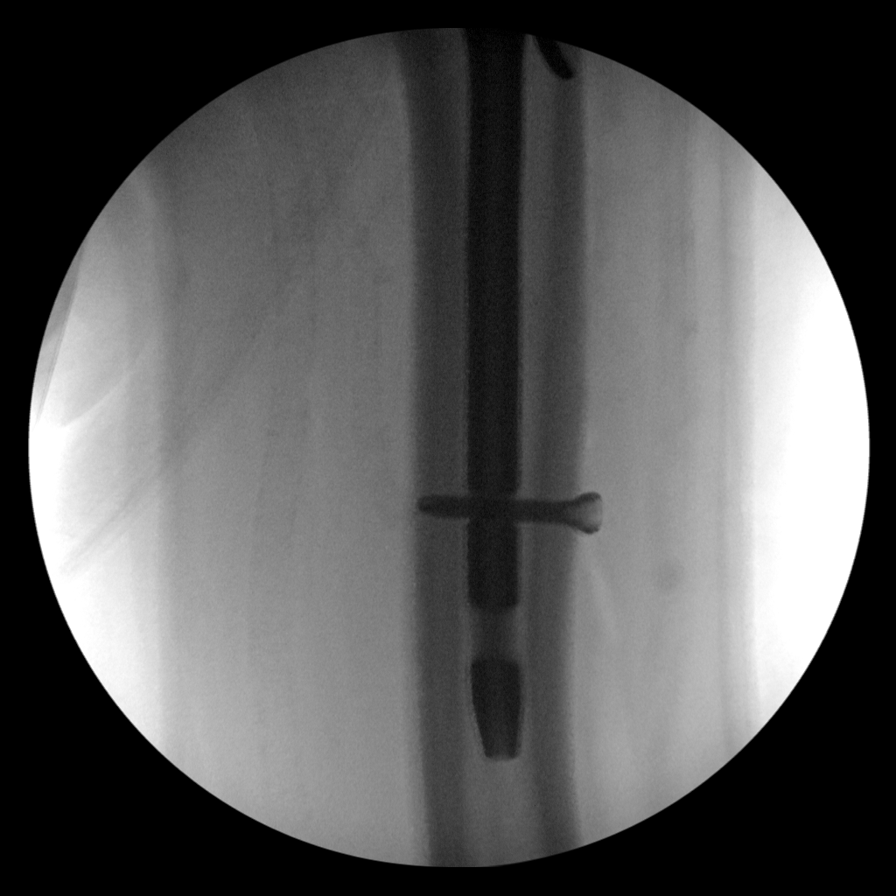
[im 5/7]
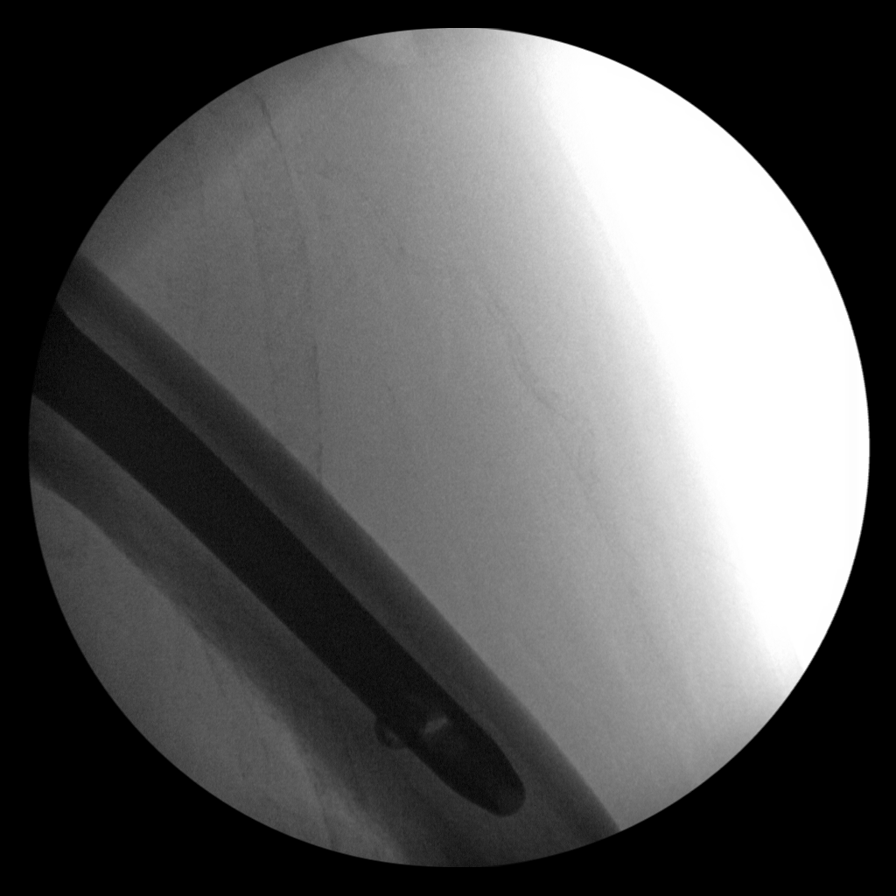
[im 6/7]
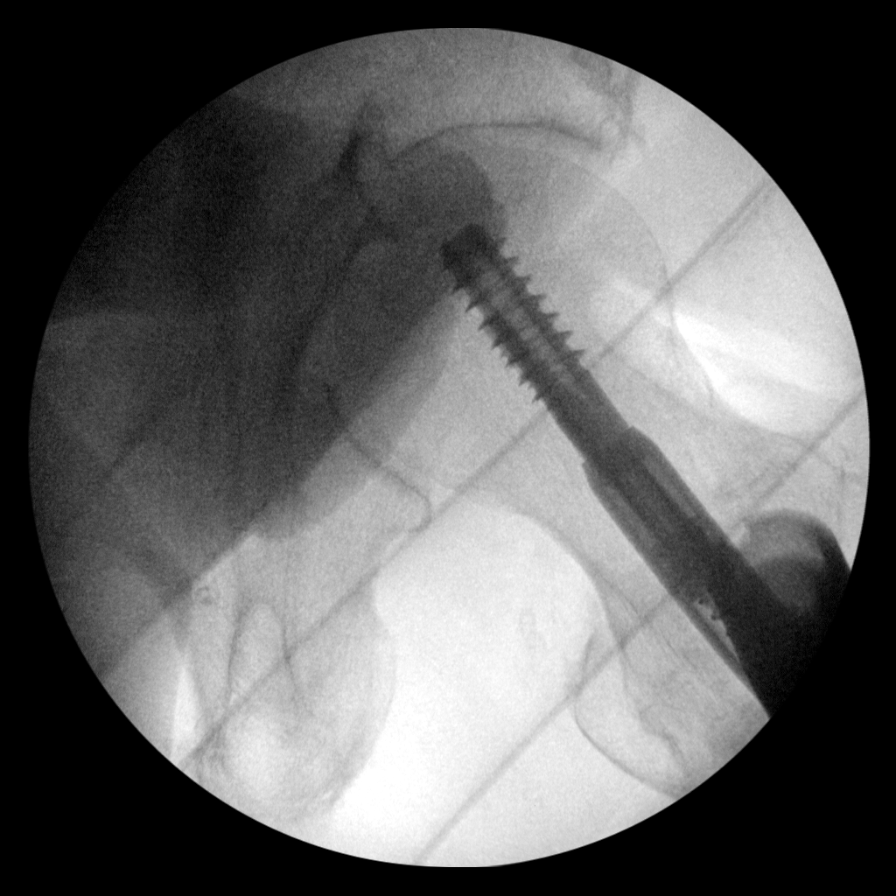
[im 7/7]
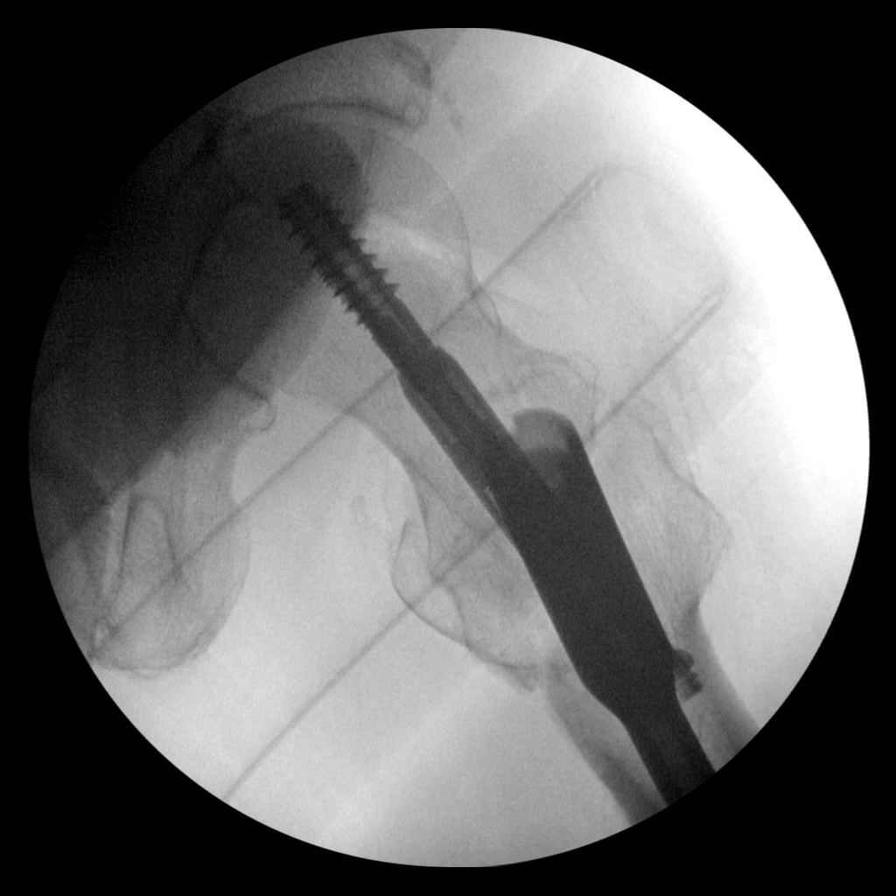

[7 of 7 positions shown; findings below may reference images not displayed]

FINDINGS: Six fluoroscopic spot views of the left proximal femur obtained in
the operating room. Intertrochanteric femur fracture with subsequent
intramedullary nail, trans trochanteric and distal locking screw
fixation. Total fluoroscopy time 1 minutes 47 seconds.
IMPRESSION: Procedural fluoroscopy during left proximal femur fracture ORIF.

## 2019-09-15 IMAGING — DX DG HIP (WITH OR WITHOUT PELVIS) 2-3V*L*
4 series · 4 of 4 positions shown · non-contrast
Comparison: Preoperative radiograph yesterday.

CLINICAL DATA: Left hip IM nail.

EXAM:
DG HIP (WITH OR WITHOUT PELVIS) 2-3V LEFT

[pelvis ap (1 of 3)]
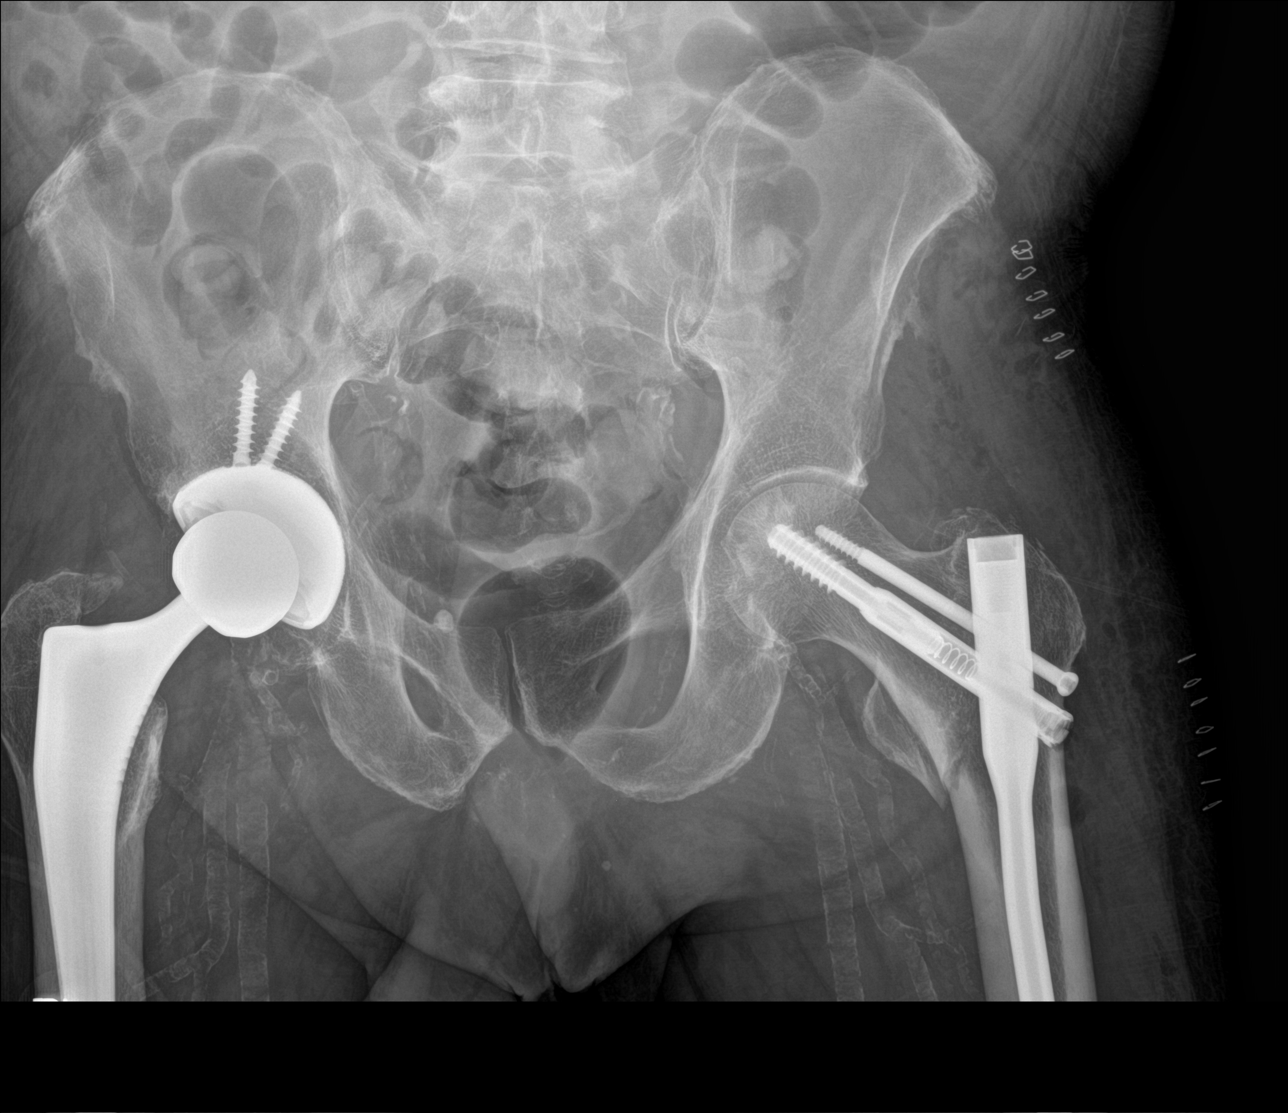

[pelvis ap (2 of 3)]
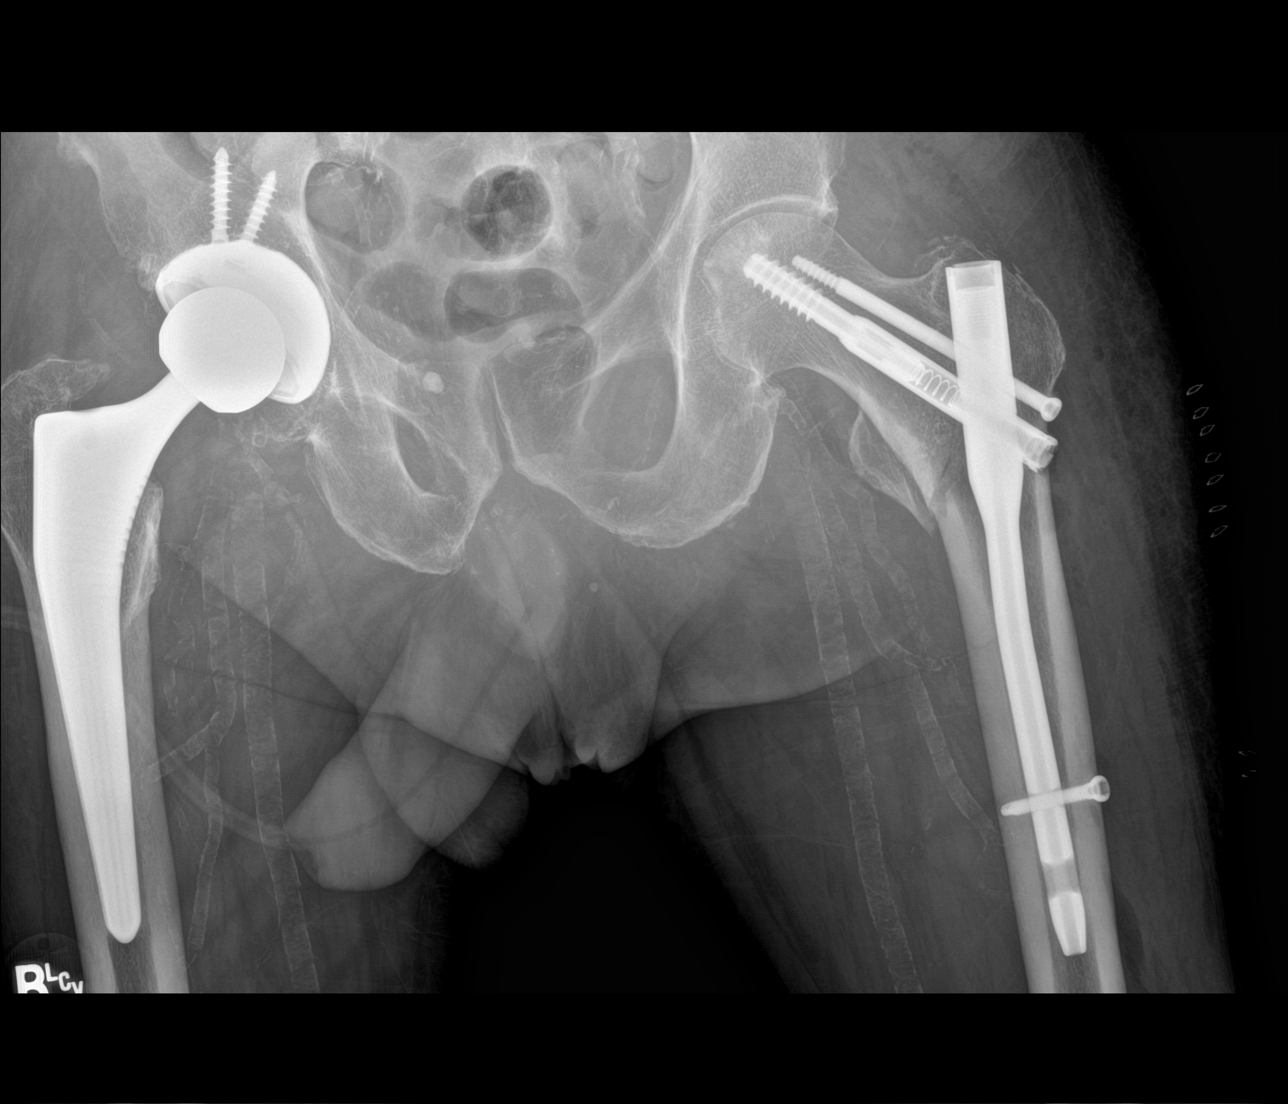

[pelvis ap (3 of 3)]
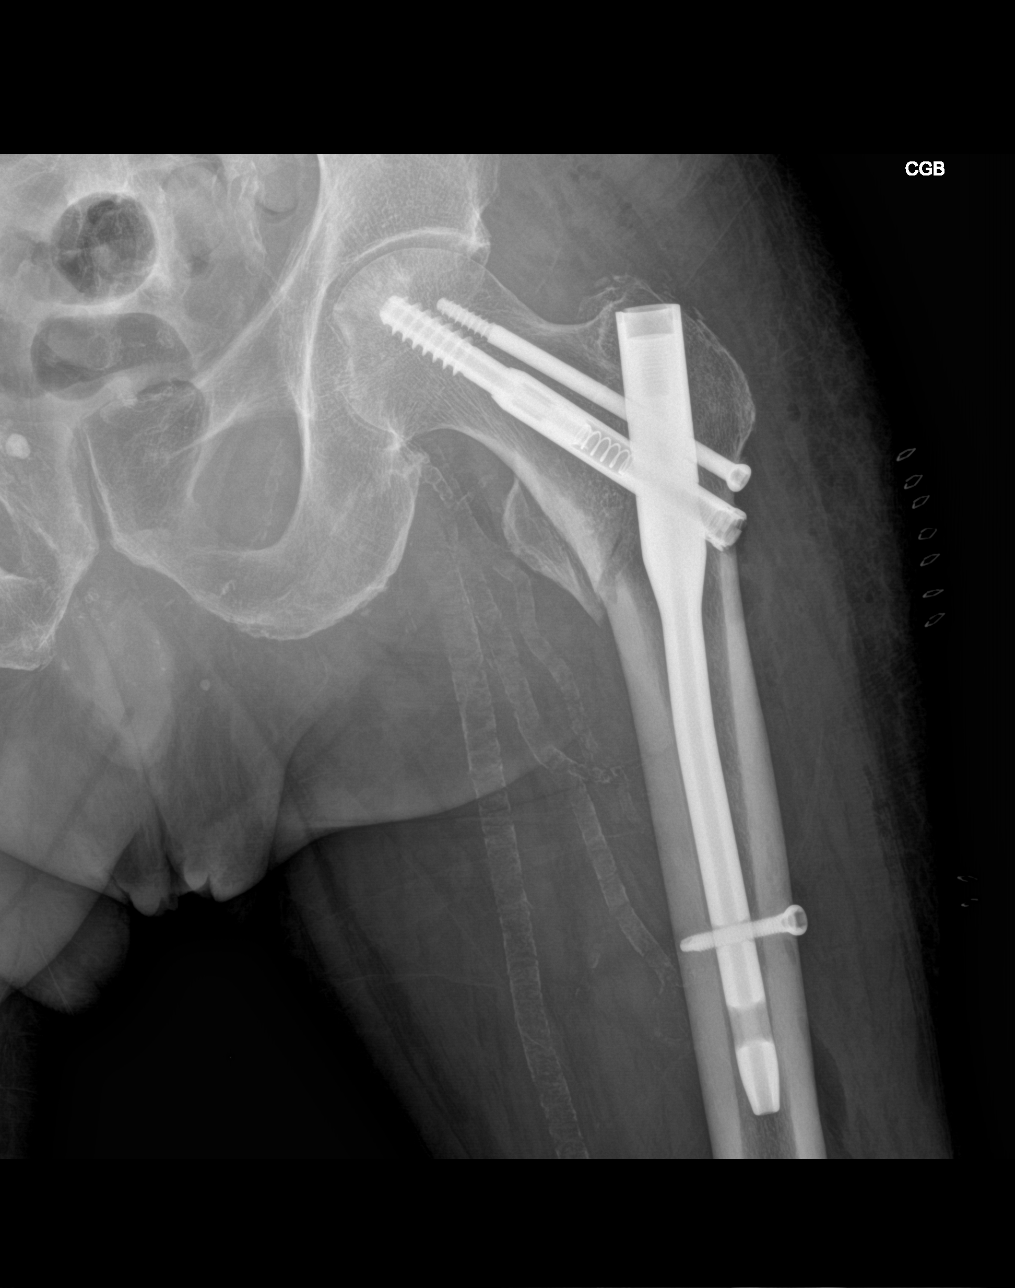

[pelvis lat]
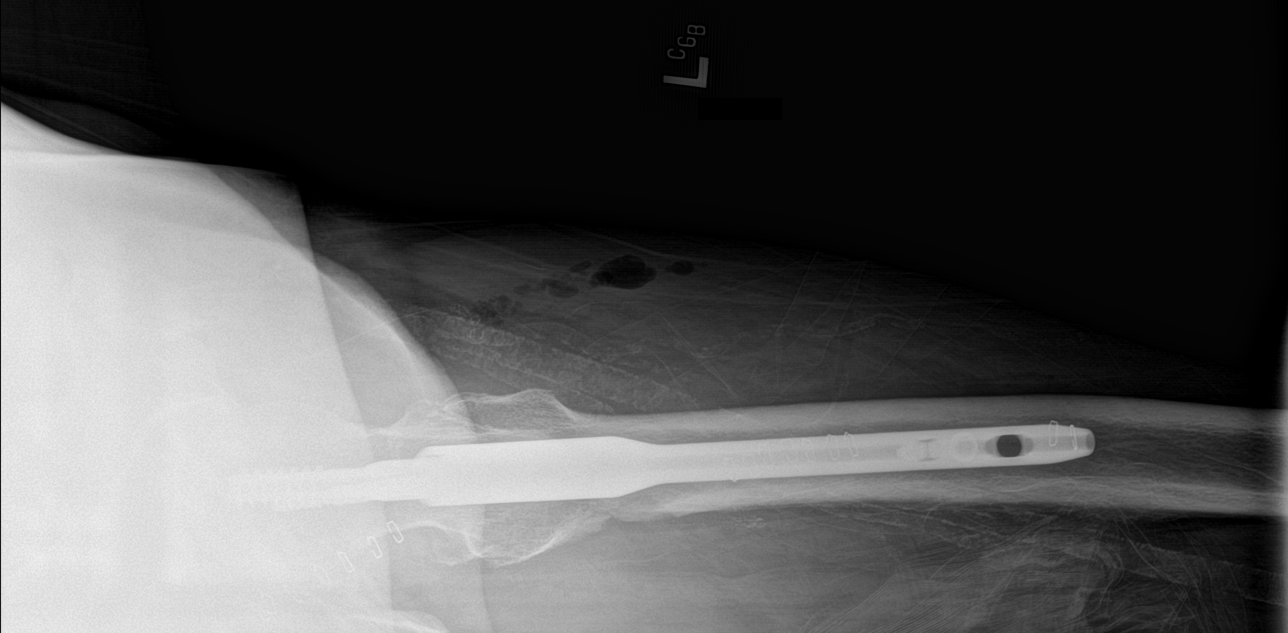

[4 of 4 positions shown; findings below may reference images not displayed]

FINDINGS: Intramedullary nail with trans trochanteric and distal locking screw
fixation traverse intertrochanteric femur fracture. The fracture is
in improved alignment compared to preoperative imaging. There is
residual displacement involving the lesser trochanter. Recent
postsurgical change includes air and edema in the soft tissues with
lateral skin staples. Right hip arthroplasty is intact were
visualized. There are advanced vascular calcifications.
IMPRESSION: ORIF intertrochanteric left femur fracture without immediate
postoperative complication.

## 2019-09-15 SURGERY — FIXATION, FRACTURE, INTERTROCHANTERIC, WITH INTRAMEDULLARY ROD
Anesthesia: Spinal | Site: Hip | Laterality: Left

## 2019-09-15 MED ORDER — CEFAZOLIN SODIUM-DEXTROSE 2-4 GM/100ML-% IV SOLN
2.0000 g | Freq: Four times a day (QID) | INTRAVENOUS | Status: AC
  Administered 2019-09-15 – 2019-09-16 (×3): 2 g via INTRAVENOUS
  Filled 2019-09-15 (×3): qty 100

## 2019-09-15 MED ORDER — BUPIVACAINE HCL (PF) 0.5 % IJ SOLN
INTRAMUSCULAR | Status: AC
  Filled 2019-09-15: qty 10

## 2019-09-15 MED ORDER — KETAMINE HCL 50 MG/ML IJ SOLN
INTRAMUSCULAR | Status: AC
  Filled 2019-09-15: qty 10

## 2019-09-15 MED ORDER — MIDAZOLAM HCL 5 MG/5ML IJ SOLN
INTRAMUSCULAR | Status: DC | PRN
  Administered 2019-09-15: 2 mg via INTRAVENOUS

## 2019-09-15 MED ORDER — ACETAMINOPHEN 500 MG PO TABS
1000.0000 mg | ORAL_TABLET | Freq: Three times a day (TID) | ORAL | Status: AC
  Administered 2019-09-15 – 2019-09-16 (×4): 1000 mg via ORAL
  Filled 2019-09-15 (×4): qty 2

## 2019-09-15 MED ORDER — ONDANSETRON HCL 4 MG/2ML IJ SOLN: 4.0000 mg | Freq: Four times a day (QID) | INTRAMUSCULAR | Status: DC | PRN

## 2019-09-15 MED ORDER — PROPOFOL 500 MG/50ML IV EMUL
INTRAVENOUS | Status: DC | PRN
  Administered 2019-09-15: 100 ug/kg/min via INTRAVENOUS
  Administered 2019-09-15: 75 ug/kg/min via INTRAVENOUS

## 2019-09-15 MED ORDER — ENOXAPARIN SODIUM 40 MG/0.4ML ~~LOC~~ SOLN
40.0000 mg | SUBCUTANEOUS | Status: DC
  Administered 2019-09-16 – 2019-09-18 (×3): 40 mg via SUBCUTANEOUS
  Filled 2019-09-15 (×3): qty 0.4

## 2019-09-15 MED ORDER — DOCUSATE SODIUM 100 MG PO CAPS
100.0000 mg | ORAL_CAPSULE | Freq: Two times a day (BID) | ORAL | Status: DC
  Administered 2019-09-15 – 2019-09-18 (×6): 100 mg via ORAL
  Filled 2019-09-15 (×6): qty 1

## 2019-09-15 MED ORDER — FLEET ENEMA 7-19 GM/118ML RE ENEM: 1.0000 | ENEMA | Freq: Once | RECTAL | Status: DC | PRN

## 2019-09-15 MED ORDER — PROPOFOL 10 MG/ML IV BOLUS
INTRAVENOUS | Status: AC
  Filled 2019-09-15: qty 20

## 2019-09-15 MED ORDER — CHLORHEXIDINE GLUCONATE CLOTH 2 % EX PADS
6.0000 | MEDICATED_PAD | Freq: Every day | CUTANEOUS | Status: DC
  Administered 2019-09-15 – 2019-09-16 (×2): 6 via TOPICAL

## 2019-09-15 MED ORDER — METHOCARBAMOL 500 MG PO TABS: 500.0000 mg | ORAL_TABLET | Freq: Four times a day (QID) | ORAL | Status: DC | PRN

## 2019-09-15 MED ORDER — PROPOFOL 500 MG/50ML IV EMUL
INTRAVENOUS | Status: AC
  Filled 2019-09-15: qty 50

## 2019-09-15 MED ORDER — METOCLOPRAMIDE HCL 10 MG PO TABS: 5.0000 mg | ORAL_TABLET | Freq: Three times a day (TID) | ORAL | Status: DC | PRN

## 2019-09-15 MED ORDER — BUPIVACAINE HCL (PF) 0.5 % IJ SOLN
INTRAMUSCULAR | Status: DC | PRN
  Administered 2019-09-15: 3 mL via INTRATHECAL

## 2019-09-15 MED ORDER — METHOCARBAMOL 1000 MG/10ML IJ SOLN
500.0000 mg | Freq: Four times a day (QID) | INTRAVENOUS | Status: DC | PRN
  Filled 2019-09-15: qty 5

## 2019-09-15 MED ORDER — OXYCODONE HCL 5 MG PO TABS
5.0000 mg | ORAL_TABLET | ORAL | Status: DC | PRN
  Administered 2019-09-15 – 2019-09-18 (×5): 10 mg via ORAL
  Administered 2019-09-18: 5 mg via ORAL
  Filled 2019-09-15 (×6): qty 2

## 2019-09-15 MED ORDER — SODIUM CHLORIDE 0.9 % IR SOLN
Status: DC | PRN
  Administered 2019-09-15: 1000 mL

## 2019-09-15 MED ORDER — ENSURE ENLIVE PO LIQD
237.0000 mL | Freq: Two times a day (BID) | ORAL | Status: DC
  Administered 2019-09-16 – 2019-09-18 (×3): 237 mL via ORAL

## 2019-09-15 MED ORDER — SENNOSIDES-DOCUSATE SODIUM 8.6-50 MG PO TABS: 1.0000 | ORAL_TABLET | Freq: Every evening | ORAL | Status: DC | PRN

## 2019-09-15 MED ORDER — BUPIVACAINE LIPOSOME 1.3 % IJ SUSP
INTRAMUSCULAR | Status: DC | PRN
  Administered 2019-09-15: 50 mL

## 2019-09-15 MED ORDER — EPHEDRINE SULFATE 50 MG/ML IJ SOLN
INTRAMUSCULAR | Status: DC | PRN
  Administered 2019-09-15: 10 mg via INTRAVENOUS

## 2019-09-15 MED ORDER — DEXAMETHASONE SODIUM PHOSPHATE 10 MG/ML IJ SOLN
INTRAMUSCULAR | Status: DC | PRN
Start: 2019-09-15 — End: 2019-09-15
  Administered 2019-09-15: 4 mg via INTRAVENOUS

## 2019-09-15 MED ORDER — SODIUM CHLORIDE 0.9 % IV SOLN: INTRAVENOUS | Status: DC

## 2019-09-15 MED ORDER — JUVEN PO PACK
1.0000 | PACK | Freq: Two times a day (BID) | ORAL | Status: DC
  Administered 2019-09-16: 1 via ORAL

## 2019-09-15 MED ORDER — ONDANSETRON HCL 4 MG/2ML IJ SOLN
INTRAMUSCULAR | Status: DC | PRN
  Administered 2019-09-15: 4 mg via INTRAVENOUS

## 2019-09-15 MED ORDER — MIDAZOLAM HCL 2 MG/2ML IJ SOLN
INTRAMUSCULAR | Status: AC
  Filled 2019-09-15: qty 2

## 2019-09-15 MED ORDER — METOCLOPRAMIDE HCL 5 MG/ML IJ SOLN: 5.0000 mg | Freq: Three times a day (TID) | INTRAMUSCULAR | Status: DC | PRN

## 2019-09-15 MED ORDER — KETAMINE HCL 50 MG/ML IJ SOLN
INTRAMUSCULAR | Status: DC | PRN
  Administered 2019-09-15: 3 mg via INTRAMUSCULAR

## 2019-09-15 MED ORDER — BISACODYL 10 MG RE SUPP: 10.0000 mg | Freq: Every day | RECTAL | Status: DC | PRN

## 2019-09-15 MED ORDER — TRAMADOL HCL 50 MG PO TABS: 50.0000 mg | ORAL_TABLET | Freq: Four times a day (QID) | ORAL | Status: DC | PRN

## 2019-09-15 MED ORDER — CEFAZOLIN SODIUM 1 G IJ SOLR
INTRAMUSCULAR | Status: AC
  Filled 2019-09-15: qty 20

## 2019-09-15 MED ORDER — OXYCODONE HCL 5 MG PO TABS: 2.5000 mg | ORAL_TABLET | ORAL | Status: DC | PRN

## 2019-09-15 MED ORDER — PROPOFOL 10 MG/ML IV BOLUS
INTRAVENOUS | Status: DC | PRN
  Administered 2019-09-15: 200 mg via INTRAVENOUS
  Administered 2019-09-15 (×2): 20 mg via INTRAVENOUS

## 2019-09-15 MED ORDER — LACTATED RINGERS IV SOLN: INTRAVENOUS | Status: DC | PRN

## 2019-09-15 MED ORDER — ONDANSETRON HCL 4 MG PO TABS: 4.0000 mg | ORAL_TABLET | Freq: Four times a day (QID) | ORAL | Status: DC | PRN

## 2019-09-15 MED ORDER — PHENYLEPHRINE HCL (PRESSORS) 10 MG/ML IV SOLN
INTRAVENOUS | Status: DC | PRN
  Administered 2019-09-15 (×2): 100 ug via INTRAVENOUS
  Administered 2019-09-15: 50 ug via INTRAVENOUS
  Administered 2019-09-15: 100 ug via INTRAVENOUS
  Administered 2019-09-15 (×2): 200 ug via INTRAVENOUS
  Administered 2019-09-15: 50 ug via INTRAVENOUS

## 2019-09-15 MED ORDER — HYDROMORPHONE HCL 1 MG/ML IJ SOLN: 0.2500 mg | INTRAMUSCULAR | Status: DC | PRN

## 2019-09-15 SURGICAL SUPPLY — 46 items
"PENCIL ELECTRO HAND CTR " (MISCELLANEOUS) ×1 IMPLANT
BIT DRILL 4.0X280 (BIT) ×1 IMPLANT
BIT DRILL 5.0 CALIBRATED STEP (BIT) ×1 IMPLANT
BLADE SURG 15 STRL LF DISP TIS (BLADE) ×1 IMPLANT
BLADE SURG 15 STRL SS (BLADE) ×1
CANISTER SUCT 1200ML W/VALVE (MISCELLANEOUS) ×2 IMPLANT
CHLORAPREP W/TINT 26 (MISCELLANEOUS) ×2 IMPLANT
COVER WAND RF STERILE (DRAPES) ×2 IMPLANT
DRAPE 3/4 80X56 (DRAPES) ×2 IMPLANT
DRAPE SURG 17X11 SM STRL (DRAPES) ×4 IMPLANT
DRAPE U-SHAPE 47X51 STRL (DRAPES) ×4 IMPLANT
DRSG OPSITE POSTOP 3X4 (GAUZE/BANDAGES/DRESSINGS) ×6 IMPLANT
ELECT REM PT RETURN 9FT ADLT (ELECTROSURGICAL) ×2
ELECTRODE REM PT RTRN 9FT ADLT (ELECTROSURGICAL) ×1 IMPLANT
GLOVE BIOGEL PI IND STRL 8 (GLOVE) ×1 IMPLANT
GLOVE BIOGEL PI INDICATOR 8 (GLOVE) ×1
GLOVE SURG SYN 7.5  E (GLOVE) ×1
GLOVE SURG SYN 7.5 E (GLOVE) ×1 IMPLANT
GLOVE SURG SYN 7.5 PF PI (GLOVE) ×1 IMPLANT
GOWN STRL REUS W/ TWL LRG LVL3 (GOWN DISPOSABLE) ×1 IMPLANT
GOWN STRL REUS W/ TWL XL LVL3 (GOWN DISPOSABLE) ×1 IMPLANT
GOWN STRL REUS W/TWL LRG LVL3 (GOWN DISPOSABLE) ×1
GOWN STRL REUS W/TWL XL LVL3 (GOWN DISPOSABLE) ×1
GUIDE PIN 3.2X330 (PIN) ×2
GUIDEWIRE BALL NOSE 3.0X900 (WIRE) ×1 IMPLANT
KIT PATIENT CARE HANA TABLE (KITS) ×2 IMPLANT
KIT TURNOVER KIT A (KITS) ×2 IMPLANT
MAT ABSORB  FLUID 56X50 GRAY (MISCELLANEOUS) ×2
MAT ABSORB FLUID 56X50 GRAY (MISCELLANEOUS) ×2 IMPLANT
NAIL FEM TROCH IM 10X130 (Nail) ×1 IMPLANT
NDL FILTER BLUNT 18X1 1/2 (NEEDLE) ×1 IMPLANT
NEEDLE FILTER BLUNT 18X 1/2SAF (NEEDLE) ×1
NEEDLE FILTER BLUNT 18X1 1/2 (NEEDLE) ×1 IMPLANT
NEEDLE HYPO 22GX1.5 SAFETY (NEEDLE) ×2 IMPLANT
NS IRRIG 1000ML POUR BTL (IV SOLUTION) ×2 IMPLANT
PACK HIP COMPR (MISCELLANEOUS) ×2 IMPLANT
PENCIL ELECTRO HAND CTR (MISCELLANEOUS) ×2 IMPLANT
PIN GUIDE 3.2X330 (PIN) IMPLANT
SCREW BONE CANC A/R 5X85 (Screw) ×1 IMPLANT
SCREW LAG GALILEO 10.5X105 (Screw) ×1 IMPLANT
STAPLER SKIN PROX 35W (STAPLE) ×2 IMPLANT
SUT VIC AB 2-0 CT2 27 (SUTURE) ×2 IMPLANT
SYR 10ML LL (SYRINGE) ×2 IMPLANT
SYR 30ML LL (SYRINGE) ×2 IMPLANT
TAPE CLOTH 3X10 WHT NS LF (GAUZE/BANDAGES/DRESSINGS) ×4 IMPLANT
TOOL ACTIVATION (INSTRUMENTS) ×1 IMPLANT

## 2019-09-15 NOTE — Progress Notes (Signed)
Pt arrived back on the unit at this time from Surgery. Pt has 3 sites on left hip with honey comb and opsite covering. Uppermost site has some drainage present and has been reinforced with abd pad per Dr. Posey Pronto. Will cont to monitor. Pt had a Spinal Block and still experiencing the numbness sensation.

## 2019-09-15 NOTE — Transfer of Care (Signed)
Immediate Anesthesia Transfer of Care Note  Patient: Corey Gonzalez  Procedure(s) Performed: INTRAMEDULLARY (IM) NAIL INTERTROCHANTRIC (Left Hip)  Patient Location: PACU  Anesthesia Type:MAC and Spinal  Level of Consciousness: awake, alert  and oriented  Airway & Oxygen Therapy: Patient Spontanous Breathing and Patient connected to nasal cannula oxygen  Post-op Assessment: Report given to RN and Post -op Vital signs reviewed and stable  Post vital signs: Reviewed and stable  Last Vitals:  Vitals Value Taken Time  BP 113/71   Temp    Pulse 102 09/15/19 1638  Resp 14 09/15/19 1638  SpO2 98 % 09/15/19 1638  Vitals shown include unvalidated device data.  Last Pain:  Vitals:   09/15/19 1114  TempSrc: Oral  PainSc:          Complications: No complications documented.

## 2019-09-15 NOTE — H&P (Signed)
H&P reviewed. No significant changes noted.  

## 2019-09-15 NOTE — Consult Note (Addendum)
ORTHOPAEDIC CONSULTATION  REQUESTING PHYSICIAN: Georgette Shell, MD  Chief Complaint:   L hip pain  History of Present Illness: Corey Gonzalez is a 79 y.o. malewho had a fall yesterday at home while cleaning his dog.  The patient noted immediate hip pain and inability to ambulate.  The patient ambulates unassisted at baseline. He lives with his friend and their 4 dogs.  Pain is described as sharp at its worst and a dull ache at its best.  Pain is rated a 10 out of 10 in severity.  Pain is improved with rest and immobilization.  Pain is worse with any sort of movement.  X-rays in the emergency department show a left intertrochanteric hip fracture.  He drinks 5-6 ounces of alcohol every night.  He has been diagnosed with diabetes and hypertension, but frequently does not take his medications.  Blood glucose level on admission was 194.  He also has a history of CKD and creatinine on admission was 1.41.  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  Past Medical History:  Diagnosis Date  . Diabetes mellitus without complication ([place])   . Hypertension    Past Surgical History:  Procedure Laterality Date  . JOINT REPLACEMENT     bilateral knees  . JOINT REPLACEMENT Right    hip   Social History   Socioeconomic History  . Marital status: Single    Spouse name: Not on file  . Number of children: Not on file  . Years of education: Not on file  . Highest education level: Not on file  Occupational History  . Not on file  Tobacco Use  . Smoking status: Current Some Day Smoker  . Smokeless tobacco: Never Used  . Tobacco comment: ~5 per week  Substance and Sexual Activity  . Alcohol use: Yes    Alcohol/week: 35.0 standard drinks    Types: 35 Standard drinks or equivalent per week    Comment: 3 mixed (liquor) drinks nightly  . Drug use: Not on file  . Sexual activity: Not on  file  Other Topics Concern  . Not on file  Social History Narrative  . Not on file   Social Determinants of Health   Financial Resource Strain:   . Difficulty of Paying Living Expenses:   Food Insecurity:   . Worried About Charity fundraiser in the Last Year:   . Arboriculturist in the Last Year:   Transportation Needs:   . Film/video editor (Medical):   Marland Kitchen Lack of Transportation (Non-Medical):   Physical Activity:   . Days of Exercise per Week:   . Minutes of Exercise per Session:   Stress:   . Feeling of Stress :   Social Connections:   . Frequency of Communication with Friends and Family:   . Frequency of Social Gatherings with Friends and Family:   . Attends Religious Services:   . Active Member of Clubs or Organizations:   . Attends Archivist Meetings:   Marland Kitchen Marital Status:    Family History  Problem Relation Age of Onset  . Diabetes Other    No Known Allergies Prior to Admission medications   Not on File   Recent Labs    09/14/19 2002 09/15/19 0424  WBC 5.5 7.2  HGB 13.6 12.6*  HCT 39.4 36.4*  PLT 142* 130*  K 2.6* 3.2*  CL 98 100  CO2 28 29  BUN  10 8  CREATININE 1.41* 1.22  GLUCOSE 194* 161*  CALCIUM 8.3* 8.2*  INR 1.0  --    DG Chest Portable 1 View  Result Date: 09/14/2019 CLINICAL DATA:  79 year old male status post fall at home. Left femur fracture. EXAM: PORTABLE CHEST 1 VIEW COMPARISON:  None. FINDINGS: Portable AP semi upright view at 2011 hours. Lordotic view. Mild cardiomegaly. Mildly tortuous thoracic aorta. Visualized tracheal air column is within normal limits. Allowing for portable technique the lungs are clear. Negative visible bowel gas pattern. No acute osseous abnormality identified. IMPRESSION: Mild cardiomegaly.  No acute cardiopulmonary abnormality. Electronically Signed   By: Genevie Ann M.D.   On: 09/14/2019 20:28   DG Hip Unilat W or Wo Pelvis 2-3 Views Left  Result Date: 09/14/2019 CLINICAL DATA:  79 year old male  status post fall at home. EXAM: DG HIP (WITH OR WITHOUT PELVIS) 2-3V LEFT; LEFT FEMUR 2 VIEWS COMPARISON:  None. FINDINGS: Left hip: Comminuted left femur intertrochanteric fracture with mild varus impaction. Left femoral head remains normally located. Superimposed right total hip arthroplasty. No pelvis fracture identified. Grossly intact visible proximal femur. Superimposed iliofemoral calcified atherosclerosis. Negative visible lower abdominal and pelvic visceral contours. Left femur: Distal to the intertrochanteric segment the left femur appears intact. Superimposed left total knee arthroplasty appears aligned. Calcified peripheral vascular disease in the left lower extremity. IMPRESSION: 1. Comminuted left femur intertrochanteric fracture with mild varus impaction. 2. No other acute fracture or dislocation identified about the left femur or pelvis. 3. Right total hip arthroplasty, left knee arthroplasty. 4. Calcified atherosclerosis, peripheral vascular disease. Electronically Signed   By: Genevie Ann M.D.   On: 09/14/2019 20:27   DG Femur Min 2 Views Left  Result Date: 09/14/2019 CLINICAL DATA:  79 year old male status post fall at home. EXAM: DG HIP (WITH OR WITHOUT PELVIS) 2-3V LEFT; LEFT FEMUR 2 VIEWS COMPARISON:  None. FINDINGS: Left hip: Comminuted left femur intertrochanteric fracture with mild varus impaction. Left femoral head remains normally located. Superimposed right total hip arthroplasty. No pelvis fracture identified. Grossly intact visible proximal femur. Superimposed iliofemoral calcified atherosclerosis. Negative visible lower abdominal and pelvic visceral contours. Left femur: Distal to the intertrochanteric segment the left femur appears intact. Superimposed left total knee arthroplasty appears aligned. Calcified peripheral vascular disease in the left lower extremity. IMPRESSION: 1. Comminuted left femur intertrochanteric fracture with mild varus impaction. 2. No other acute fracture or  dislocation identified about the left femur or pelvis. 3. Right total hip arthroplasty, left knee arthroplasty. 4. Calcified atherosclerosis, peripheral vascular disease. Electronically Signed   By: Genevie Ann M.D.   On: 09/14/2019 20:27     Positive ROS: All other systems have been reviewed and were otherwise negative with the exception of those mentioned in the HPI and as above.  Physical Exam: BP (!) 156/89 (BP Location: Right Arm)   Pulse 98   Temp 98 F (36.7 C) (Oral)   Resp 18   Ht 5\' 8"  (1.727 m)   Wt 74.8 kg   SpO2 100%   BMI 25.09 kg/m  General:  Alert, no acute distress Psychiatric:  Patient is competent for consent with normal mood and affect   Cardiovascular:  No pedal edema, regular rate and rhythm Respiratory:  No wheezing, non-labored breathing GI:  Abdomen is soft and non-tender Skin:  No lesions in the area of chief complaint, no erythema Neurologic:  Sensation intact distally, CN grossly intact Lymphatic:  No axillary or cervical lymphadenopathy  Orthopedic Exam:  LLE: +  DF/PF/EHL SILT grossly over foot Foot wwp +Log roll/axial load   X-rays:  As above: L intertrochanteric hip fracture  Assessment/Plan: NAS WAFER is a 79 y.o. male with a L intertrochanteric hip fracture   1. I discussed the various treatment options including both surgical and non-surgical management of her fracture with the patient. We discussed the high risk of perioperative complications due to patient's age and other co-morbidities. After discussion of risks, benefits, and alternatives to surgery, the patient was in agreement to proceed with surgery. The goals of surgery would be to provide adequate pain relief and allow for mobilization. Plan for surgery is L hip cephalomedullary nailing today, 09/15/2019. 2. NPO until OR 3. Hold anticoagulation in advance of OR          Leim Fabry   09/15/2019 11:46 AM

## 2019-09-15 NOTE — Progress Notes (Signed)
Initial Nutrition Assessment  DOCUMENTATION CODES:   Not applicable  INTERVENTION:  Ensure Enlive po BID, each supplement provides 350 kcal and 20 grams of protein  1 packet Juven BID, each packet provides 95 calories, 2.5 grams of protein (collagen), and 9.8 grams of carbohydrate (3 grams sugar); also contains 7 grams of L-arginine and L-glutamine, 300 mg vitamin C, 15 mg vitamin E, 1.2 mcg vitamin B-12, 9.5 mg zinc, 200 mg calcium, and 1.5 g  Calcium Beta-hydroxy-Beta-methylbutyrate to support wound healing  Recommend monitoring magnesium, potassium, and phosphorus with diet advancement as pt is at risk for refeeding syndrome given reported alcohol use  NUTRITION DIAGNOSIS:   Increased nutrient needs related to post-op healing, hip fracture as evidenced by estimated needs.    GOAL:   Patient will meet greater than or equal to 90% of their needs   MONITOR:   Labs, I & O's, Diet advancement, Weight trends, Supplement acceptance, PO intake  REASON FOR ASSESSMENT:   Consult Hip fracture protocol  ASSESSMENT:  RD working remotely.  79 year old male with past medical history of DM2, HTN, renal insufficiency, and alcohol dependence presented with severe left hip pain after a fall at home admitted for left intertrochanteric femur fracture.  Patient is currently NPO for pending surgery today. Will provide Ensure supplement with diet advancement to aid with meeting increased needs as well as Juven to support post-op wound healing. Per H&P, pt reports drinking 5-6 ounces of liquor every night. Patient is at risk for refeeding given alcohol use, noted hypokalemia, recommend monitoring magnesium, potassium, and phosphorus daily, MD to replete as needed.   Current wt 164.56 No past weight history for review  Medications reviewed and include: Folic acid, SSI, MVI, Thiamine  Labs: CBGs 130, 145,160, K 3.2 (L), Mg 1.8 (WNL)  NUTRITION - FOCUSED PHYSICAL EXAM: Unable to complete at  this time, RD working remotely.   Diet Order:   Diet Order            Diet NPO time specified Except for: Sips with Meds, Ice Chips  Diet effective midnight                 EDUCATION NEEDS:   No education needs have been identified at this time  Skin:  Skin Assessment: Reviewed RN Assessment  Last BM:  7/30  Height:   Ht Readings from Last 1 Encounters:  09/14/19 5\' 8"  (1.727 m)    Weight:   Wt Readings from Last 1 Encounters:  09/14/19 74.8 kg    Ideal Body Weight:  70 kg  BMI:  Body mass index is 25.09 kg/m.  Estimated Nutritional Needs:   Kcal:  2000-2200  Protein:  100-110  Fluid:  >/= 2 L   Lajuan Lines, RD, LDN Clinical Nutrition After Hours/Weekend Pager # in [phone]

## 2019-09-15 NOTE — Anesthesia Procedure Notes (Signed)
Spinal  Patient location during procedure: OR Start time: 09/15/2019 3:07 PM End time: 09/15/2019 3:12 PM Staffing Resident/CRNA: Sadey Yandell L, CRNA Preanesthetic Checklist Completed: patient identified, IV checked, site marked, risks and benefits discussed, surgical consent, monitors and equipment checked, pre-op evaluation and timeout performed Spinal Block Patient position: sitting Prep: Betadine Patient monitoring: heart rate, continuous pulse ox and blood pressure Approach: midline Location: L4-5 Injection technique: single-shot Needle Needle type: Whitacre and Introducer  Needle gauge: 24 G Needle length: 9 cm Additional Notes Negative paresthesia. Negative blood return. Positive free-flowing CSF. Expiration date of kit checked and confirmed. Patient tolerated procedure well, without complications.

## 2019-09-15 NOTE — Op Note (Addendum)
DATE OF SURGERY: 09/15/2019  PREOPERATIVE DIAGNOSIS: Left intertrochanteric hip fracture  POSTOPERATIVE DIAGNOSIS: Left intertrochanteric hip fracture  PROCEDURE: Intramedullary nailing of L femur with cephalomedullary device  SURGEON: Cato Mulligan, MD  ANESTHESIA: spinal  EBL: 100 cc  IVF: per anesthesia record  COMPONENTS:  AOS Galileo ES Short Nail: 10x242mm; 111mm lag screw; 58mm distal cortical interlocking screw; 5x82mm antirotation screw  INDICATIONS: Corey Gonzalez is a 79 y.o. male who sustained an intertrochanteric fracture after a fall. Risks and benefits of intramedullary nailing were explained to the patient and/or family . Risks include but are not limited to bleeding, infection, injury to tissues, nerves, vessels, nonunion/malunion, hardware failure, limb length discrepancy/hip rotation mismatch and risks of anesthesia. The patient and/or family understand these risks, have completed an informed consent, and wish to proceed.   PROCEDURE:  The patient was brought into the operating room. After administering anesthesia, the patient was placed in the supine position on the Hana table. The uninjured leg was placed in an extended position while the injured lower extremity was placed in longitudinal traction. The fracture was reduced using longitudinal traction and internal rotation. The adequacy of reduction was verified fluoroscopically in AP and lateral projections and was not adequate as medial cortex was not aligned. The lateral aspect of the hip and thigh were prepped with ChloraPrep solution before being draped sterilely. Preoperative IV antibiotics were administered. A timeout was performed to verify the appropriate surgical site, patient, and procedure.    The greater trochanter was identified and an approximately 6 cm incision was made about 3 fingerbreadths above the tip of the greater trochanter. The incision was carried down through the subcutaneous tissues to expose the  gluteal fascia. This was split the length of the incision, providing access to the tip of the trochanter. Under fluoroscopic guidance, a guidewire was drilled through the tip of the trochanter into the proximal metaphysis to the level of the lesser trochanter. After verifying its position fluoroscopically in AP and lateral projections, it was overreamed with the opening reamer to the level of the lesser trochanter. A guidewire was passed down through the femoral canal to the supracondylar region. The guidewire was overreamed sequentially using the flexible reamers. The appropriate sized nail was selected and advanced to the appropriate depth as verified fluoroscopically.    The guide system for the lag screw was positioned and advanced through an approximately 8cm incision over the lateral aspect of the proximal femur.  This incision was longer than usual due to the lack of adequate reduction and to allow placement for a bone hook with the guide system plate is the one with the implants in.  A bone hook was placed on the inferomedial aspect of the femoral neck fragment and the fracture was reduced with lateral and proximally directed force.  This allowed for appropriate medial calcar alignment.  The guidewire was then drilled up through the femoral nail and into the femoral neck to rest within 5 mm of subchondral bone. After verifying its position in the femoral neck and head in both AP and lateral projections, the guidewire was measured and appropriate sized lag screw was selected. An antirotation screw was drilled and placed proximally through the appropriate mechanism. The guidewire was then overreamed to the appropriate depth before the lag screw was inserted and advanced to the appropriate depth as verified fluoroscopically in AP and lateral projections. The lag screw was advanced and the locking mechanism was deployed. Again, the adequacy of hardware position and  fracture reduction was verified  fluoroscopically in AP and lateral projections.   Attention was then turned to the distal interlocking screw in the diaphysis. Using a targeted assembly, a stab incision was made and hole was drilled through the nail. An interlocking screw was placed with excellent purchase. Again the adequacy of screw position was verified fluoroscopically in AP and lateral projections.   The wounds were irrigated thoroughly with sterile saline solution. Local anesthetic was injected into the wounds. Deep fascia was closed with 0-Vicryl. The subcutaneous tissues were closed using 2-0 Vicryl interrupted sutures. The skin was closed using staples. Sterile occlusive dressings were applied to all wounds. The patient was then transferred to the recovery room in satisfactory condition.   POSTOPERATIVE PLAN: The patient will be WBAT on the operative extremity. Lovenox 40mg /day x 4 weeks to start on POD#1. Perioperative IV antibiotics x 24 hours. PT/OT on POD#1.

## 2019-09-15 NOTE — Progress Notes (Signed)
15 minute call to floor. 

## 2019-09-15 NOTE — Progress Notes (Signed)
Top dressing to left hip saturated with bright red blood, dr patel aware and advised to reenforce with abd and tape.  Will report to floor RN and continue to monitor.

## 2019-09-15 NOTE — Progress Notes (Signed)
PROGRESS NOTE    Corey Gonzalez  FVC:[id number] DOB: May 27, 1940 DOA: 09/14/2019 PCP: Patient, No Pcp Per   Brief Narrative:Corey Gonzalez is a 79 y.o. male with medical history significant for diabetes mellitus, hypertension, renal insufficiency, and alcohol dependence, now presenting to the emergency department with severe left hip pain after fall at home.  Patient reports that he was in his usual state of health and was cleaning his dog when he slipped and fell onto his left side without hitting his head or losing consciousness.  He was experiencing immediate and severe pain at the left hip and a deformity was noted.  EMS was called and the patient was treated with 100 mcg of fentanyl prior to arrival in the ED.  He denies any recent illness, reports that he is fairly active at baseline and never experiences chest pain with exertion.  He has not been short of breath, coughing, or experiencing any subjective fevers or chills.  He reports drinking 5 to 6 ounces of liquor every night but denies any history of withdrawal.  He reports being prescribed antihypertensives and diabetes medications in the past but has elected to not take any medications. He reports chronic right leg swelling that is unchanged.   ED Course: Upon arrival to the ED, patient is found to be afebrile, saturating well on room air, and with stable blood pressure.  EKG features sinus tachycardia with rate 101 and first-degree AV nodal block.  Chest x-ray demonstrates mild cardiomegaly without acute findings.  Radiographs of the hip and femur demonstrate comminuted left intertrochanteric femur fracture.  Chemistry panel is notable for glucose 194, potassium 2.6, and creatinine 1.41.  CBC with mild thrombocytopenia and a macrocytosis without anemia.  COVID-19 screening test is negative.  Orthopedic surgery was consulted by the ED physician and recommends medical admission.  Patient was given a liter of IV fluids, fentanyl, acetaminophen, and oral  potassium in the ED.  Assessment & Plan:   Principal Problem:   Closed left hip fracture, initial encounter [facility]) Active Problems:   Diabetes mellitus without complication ([place])   Chronic kidney disease, stage 3a   Hypertension   Alcohol dependence ([place])   #1 status post fall and left hip fracture-patient slipped and fell at home.  No loss of consciousness.  Ortho consulted planning for surgery today.    #2 type 2 diabetes continue SSI. CBG (last 3)  Recent Labs    09/15/19 0351 09/15/19 0756 09/15/19 1201  GLUCAP 145* 130* 140*     #3 hypokalemia-potassium 3.2 repleted recheck labs.  Magnesium 1.8.  #4 history of essential hypertension blood pressure 156/89.  As needed hydralazine.  Home med reconciliation has not been done it appears to be.  #5 alcohol abuse he drinks 5 to 6 ounces of liquor every night.  Watch for withdrawals.  #6 history of CKD stage II creatinine 1.2 to follow-up in a.m.   Nutrition Problem: Increased nutrient needs Etiology: post-op healing, hip fracture     Signs/Symptoms: estimated needs    Interventions: Ensure Enlive (each supplement provides 350kcal and 20 grams of protein), MVI, Juven  Estimated body mass index is 25.09 kg/m as calculated from the following:   Height as of this encounter: 5\' 8"  (1.727 m).   Weight as of this encounter: 74.8 kg.  DVT prophylaxis: SCD Code Status: Full code Family Communication: None at bedside  disposition Plan:  Status is: Inpatient  Dispo: The patient is from: Home  Anticipated d/c is to: SNF              Anticipated d/c date is: > 3 days              Patient currently is not medically stable to d/c.  Patient admitted with acute hip fracture  PT eval pending  Consultants: Ortho  Procedures: None Antimicrobials: None  Subjective: Patient resting in bed he is awake and alert in no acute distress  Objective: Vitals:   09/14/19 2244 09/15/19 0020 09/15/19 0754 09/15/19 1114   BP: (!) 150/94 (!) 154/97 (!) 139/79 (!) 156/89  Pulse: (!) 109 105 101 98  Resp: 18 18 16 18   Temp: 98.5 F (36.9 C) 98 F (36.7 C) 98.3 F (36.8 C) 98 F (36.7 C)  TempSrc: Oral Oral Oral Oral  SpO2: 100% 100% 100% 100%  Weight:      Height:        Intake/Output Summary (Last 24 hours) at 09/15/2019 1455 Last data filed at 09/15/2019 1415 Gross per 24 hour  Intake --  Output 1425 ml  Net -1425 ml   Filed Weights   09/14/19 1946  Weight: 74.8 kg    Examination:  General exam: Appears calm and comfortable  Respiratory system: Clear to auscultation. Respiratory effort normal. Cardiovascular system: S1 & S2 heard, RRR. No JVD, murmurs, rubs, gallops or clicks. No pedal edema. Gastrointestinal system: Abdomen is nondistended, soft and nontender. No organomegaly or masses felt. Normal bowel sounds heard. Central nervous system: Alert and oriented. No focal neurological deficits. Extremities: tender left hip Skin: No rashes, lesions or ulcers Psychiatry: Judgement and insight appear normal. Mood & affect appropriate.     Data Reviewed: I have personally reviewed following labs and imaging studies  CBC: Recent Labs  Lab 09/14/19 2002 09/15/19 0424  WBC 5.5 7.2  NEUTROABS 3.6  --   HGB 13.6 12.6*  HCT 39.4 36.4*  MCV 101.8* 102.0*  PLT 142* 673*   Basic Metabolic Panel: Recent Labs  Lab 09/14/19 2002 09/15/19 0424  NA 139 141  K 2.6* 3.2*  CL 98 100  CO2 28 29  GLUCOSE 194* 161*  BUN 10 8  CREATININE 1.41* 1.22  CALCIUM 8.3* 8.2*  MG  --  1.8   GFR: Estimated Creatinine Clearance: 48.3 mL/min (by C-G formula based on SCr of 1.22 mg/dL). Liver Function Tests: Recent Labs  Lab 09/14/19 2002 09/15/19 0424  AST 29 25  ALT 17 15  ALKPHOS 71 65  BILITOT 0.8 0.9  PROT 7.1 6.5  ALBUMIN 3.5 3.2*   No results for input(s): LIPASE, AMYLASE in the last 168 hours. No results for input(s): AMMONIA in the last 168 hours. Coagulation Profile: Recent Labs   Lab 09/14/19 2002  INR 1.0   Cardiac Enzymes: No results for input(s): CKTOTAL, CKMB, CKMBINDEX, TROPONINI in the last 168 hours. BNP (last 3 results) No results for input(s): PROBNP in the last 8760 hours. HbA1C: Recent Labs    09/15/19 0424  HGBA1C 7.5*   CBG: Recent Labs  Lab 09/15/19 0047 09/15/19 0351 09/15/19 0756 09/15/19 1201  GLUCAP 160* 145* 130* 140*   Lipid Profile: No results for input(s): CHOL, HDL, LDLCALC, TRIG, CHOLHDL, LDLDIRECT in the last 72 hours. Thyroid Function Tests: No results for input(s): TSH, T4TOTAL, FREET4, T3FREE, THYROIDAB in the last 72 hours. Anemia Panel: No results for input(s): VITAMINB12, FOLATE, FERRITIN, TIBC, IRON, RETICCTPCT in the last 72 hours. Sepsis Labs: No results for input(s): PROCALCITON, LATICACIDVEN  in the last 168 hours.  Recent Results (from the past 240 hour(s))  SARS Coronavirus 2 by RT PCR (hospital order, performed in [facility] hospital lab) Nasopharyngeal Nasopharyngeal Swab     Status: None   Collection Time: 09/14/19  8:02 PM   Specimen: Nasopharyngeal Swab  Result Value Ref Range Status   SARS Coronavirus 2 NEGATIVE NEGATIVE Final    Comment: (NOTE) SARS-CoV-2 target nucleic acids are NOT DETECTED.  The SARS-CoV-2 RNA is generally detectable in upper and lower respiratory specimens during the acute phase of infection. The lowest concentration of SARS-CoV-2 viral copies this assay can detect is 250 copies / mL. A negative result does not preclude SARS-CoV-2 infection and should not be used as the sole basis for treatment or other patient management decisions.  A negative result may occur with improper specimen collection / handling, submission of specimen other than nasopharyngeal swab, presence of viral mutation(s) within the areas targeted by this assay, and inadequate number of viral copies (<250 copies / mL). A negative result must be combined with clinical observations, patient history, and  epidemiological information.  Fact Sheet for Patients:   StrictlyIdeas.no  Fact Sheet for Healthcare Providers: BankingDealers.co.za  This test is not yet approved or  cleared by the [place] FDA and has been authorized for detection and/or diagnosis of SARS-CoV-2 by FDA under an Emergency Use Authorization (EUA).  This EUA will remain in effect (meaning this test can be used) for the duration of the COVID-19 declaration under Section 564(b)(1) of the Act, 21 U.S.C. section 360bbb-3(b)(1), unless the authorization is terminated or revoked sooner.  Performed at [facility], [address], [place], [place] [address]   Surgical pcr screen     Status: None   Collection Time: 09/14/19 11:01 PM   Specimen: Nasal Mucosa; Nasal Swab  Result Value Ref Range Status   MRSA, PCR NEGATIVE NEGATIVE Final   Staphylococcus aureus NEGATIVE NEGATIVE Final    Comment: (NOTE) The Xpert SA Assay (FDA approved for NASAL specimens in patients 75 years of age and older), is one component of a comprehensive surveillance program. It is not intended to diagnose infection nor to guide or monitor treatment. Performed at [facility], [address] [place], [place] [address]          Radiology Studies: DG Chest Portable 1 View  Result Date: 09/14/2019 CLINICAL DATA:  79 year old male status post fall at home. Left femur fracture. EXAM: PORTABLE CHEST 1 VIEW COMPARISON:  None. FINDINGS: Portable AP semi upright view at 2011 hours. Lordotic view. Mild cardiomegaly. Mildly tortuous thoracic aorta. Visualized tracheal air column is within normal limits. Allowing for portable technique the lungs are clear. Negative visible bowel gas pattern. No acute osseous abnormality identified. IMPRESSION: Mild cardiomegaly.  No acute cardiopulmonary abnormality. Electronically Signed   By: Genevie Ann M.D.   On: 09/14/2019 20:28   DG Hip Unilat W or  Wo Pelvis 2-3 Views Left  Result Date: 09/14/2019 CLINICAL DATA:  79 year old male status post fall at home. EXAM: DG HIP (WITH OR WITHOUT PELVIS) 2-3V LEFT; LEFT FEMUR 2 VIEWS COMPARISON:  None. FINDINGS: Left hip: Comminuted left femur intertrochanteric fracture with mild varus impaction. Left femoral head remains normally located. Superimposed right total hip arthroplasty. No pelvis fracture identified. Grossly intact visible proximal femur. Superimposed iliofemoral calcified atherosclerosis. Negative visible lower abdominal and pelvic visceral contours. Left femur: Distal to the intertrochanteric segment the left femur appears intact. Superimposed left total knee arthroplasty appears  aligned. Calcified peripheral vascular disease in the left lower extremity. IMPRESSION: 1. Comminuted left femur intertrochanteric fracture with mild varus impaction. 2. No other acute fracture or dislocation identified about the left femur or pelvis. 3. Right total hip arthroplasty, left knee arthroplasty. 4. Calcified atherosclerosis, peripheral vascular disease. Electronically Signed   By: Genevie Ann M.D.   On: 09/14/2019 20:27   DG Femur Min 2 Views Left  Result Date: 09/14/2019 CLINICAL DATA:  79 year old male status post fall at home. EXAM: DG HIP (WITH OR WITHOUT PELVIS) 2-3V LEFT; LEFT FEMUR 2 VIEWS COMPARISON:  None. FINDINGS: Left hip: Comminuted left femur intertrochanteric fracture with mild varus impaction. Left femoral head remains normally located. Superimposed right total hip arthroplasty. No pelvis fracture identified. Grossly intact visible proximal femur. Superimposed iliofemoral calcified atherosclerosis. Negative visible lower abdominal and pelvic visceral contours. Left femur: Distal to the intertrochanteric segment the left femur appears intact. Superimposed left total knee arthroplasty appears aligned. Calcified peripheral vascular disease in the left lower extremity. IMPRESSION: 1. Comminuted left femur  intertrochanteric fracture with mild varus impaction. 2. No other acute fracture or dislocation identified about the left femur or pelvis. 3. Right total hip arthroplasty, left knee arthroplasty. 4. Calcified atherosclerosis, peripheral vascular disease. Electronically Signed   By: Genevie Ann M.D.   On: 09/14/2019 20:27        Scheduled Meds: . [START ON 09/16/2019] feeding supplement (ENSURE ENLIVE)  237 mL Oral BID BM  . folic acid  1 mg Oral Daily  . insulin aspart  0-9 Units Subcutaneous Q4H  . multivitamin with minerals  1 tablet Oral Daily  . [START ON 09/16/2019] nutrition supplement (JUVEN)  1 packet Oral BID BM  . thiamine  100 mg Oral Daily   Or  . thiamine  100 mg Intravenous Daily   Continuous Infusions: .  ceFAZolin (ANCEF) IV       LOS: 1 day     Georgette Shell, MD  09/15/2019, 2:55 PM

## 2019-09-15 NOTE — Anesthesia Preprocedure Evaluation (Addendum)
Anesthesia Evaluation  Patient identified by MRN, date of birth, ID band Patient awake    Reviewed: Allergy & Precautions, H&P , NPO status , Patient's Chart, lab work & pertinent test results  Airway Mallampati: II  TM Distance: >3 FB     Dental  (+) Chipped   Pulmonary neg COPD, Current Smoker,    breath sounds clear to auscultation       Cardiovascular hypertension, (-) angina(-) Past MI and (-) Cardiac Stents (-) dysrhythmias  Rhythm:regular Rate:Normal     Neuro/Psych negative neurological ROS  negative psych ROS   GI/Hepatic negative GI ROS, (+)     substance abuse  alcohol use,   Endo/Other  diabetes  Renal/GU Renal disease (CKD)     Musculoskeletal   Abdominal   Peds  Hematology negative hematology ROS (+)   Anesthesia Other Findings Past Medical History: No date: Diabetes mellitus without complication (HCC) No date: Hypertension  Past Surgical History: No date: JOINT REPLACEMENT     Comment:  bilateral knees No date: JOINT REPLACEMENT; Right     Comment:  hip  BMI    Body Mass Index: 25.09 kg/m      Reproductive/Obstetrics negative OB ROS                            Anesthesia Physical Anesthesia Plan  ASA: III  Anesthesia Plan: Spinal   Post-op Pain Management:    Induction:   PONV Risk Score and Plan: Propofol infusion  Airway Management Planned: Simple Face Mask  Additional Equipment:   Intra-op Plan:   Post-operative Plan:   Informed Consent: I have reviewed the patients History and Physical, chart, labs and discussed the procedure including the risks, benefits and alternatives for the proposed anesthesia with the patient or authorized representative who has indicated his/her understanding and acceptance.     Dental Advisory Given  Plan Discussed with: Anesthesiologist, CRNA and Surgeon  Anesthesia Plan Comments:         Anesthesia Quick  Evaluation

## 2019-09-15 NOTE — Anesthesia Postprocedure Evaluation (Signed)
Anesthesia Post Note  Patient: Corey Gonzalez  Procedure(s) Performed: INTRAMEDULLARY (IM) NAIL INTERTROCHANTRIC (Left Hip)  Patient location during evaluation: Other Anesthesia Type: Spinal Level of consciousness: awake and alert Pain management: pain level controlled Respiratory status: spontaneous breathing and nonlabored ventilation Cardiovascular status: stable Postop Assessment: no apparent nausea or vomiting, spinal receding and no headache Anesthetic complications: no   No complications documented.   Last Vitals:  Vitals:   09/15/19 1724 09/15/19 1748  BP: (!) 138/99 (!) 147/89  Pulse: 103 (!) 107  Resp: 16 18  Temp:  36.6 C  SpO2: 97% 94%    Last Pain:  Vitals:   09/15/19 1748  TempSrc: Oral  PainSc:                  Tera Mater

## 2019-09-16 LAB — CBC
HCT: 30.8 % — ABNORMAL LOW (ref 39.0–52.0)
Hemoglobin: 11 g/dL — ABNORMAL LOW (ref 13.0–17.0)
MCH: 36.2 pg — ABNORMAL HIGH (ref 26.0–34.0)
MCHC: 35.7 g/dL (ref 30.0–36.0)
MCV: 101.3 fL — ABNORMAL HIGH (ref 80.0–100.0)
Platelets: 124 10*3/uL — ABNORMAL LOW (ref 150–400)
RBC: 3.04 MIL/uL — ABNORMAL LOW (ref 4.22–5.81)
RDW: 13.8 % (ref 11.5–15.5)
WBC: 8.3 10*3/uL (ref 4.0–10.5)
nRBC: 0 % (ref 0.0–0.2)

## 2019-09-16 LAB — GLUCOSE, CAPILLARY
Glucose-Capillary: 125 mg/dL — ABNORMAL HIGH (ref 70–99)
Glucose-Capillary: 138 mg/dL — ABNORMAL HIGH (ref 70–99)
Glucose-Capillary: 160 mg/dL — ABNORMAL HIGH (ref 70–99)
Glucose-Capillary: 179 mg/dL — ABNORMAL HIGH (ref 70–99)
Glucose-Capillary: 223 mg/dL — ABNORMAL HIGH (ref 70–99)
Glucose-Capillary: 268 mg/dL — ABNORMAL HIGH (ref 70–99)

## 2019-09-16 LAB — BASIC METABOLIC PANEL
Anion gap: 8 (ref 5–15)
BUN: 13 mg/dL (ref 8–23)
CO2: 30 mmol/L (ref 22–32)
Calcium: 7.7 mg/dL — ABNORMAL LOW (ref 8.9–10.3)
Chloride: 101 mmol/L (ref 98–111)
Creatinine, Ser: 1.31 mg/dL — ABNORMAL HIGH (ref 0.61–1.24)
GFR calc Af Amer: >60 mL/min (ref >60)
GFR calc non Af Amer: 52 mL/min — ABNORMAL LOW (ref >60)
Glucose, Bld: 150 mg/dL — ABNORMAL HIGH (ref 70–99)
Potassium: 4.2 mmol/L (ref 3.5–5.1)
Sodium: 139 mmol/L (ref 135–145)

## 2019-09-16 MED ORDER — TRAMADOL HCL 50 MG PO TABS: 50.0000 mg | ORAL_TABLET | Freq: Four times a day (QID) | ORAL | 0 refills | Status: DC | PRN

## 2019-09-16 MED ORDER — OXYCODONE HCL 5 MG PO TABS: 5.0000 mg | ORAL_TABLET | ORAL | 0 refills | Status: DC | PRN

## 2019-09-16 MED ORDER — ENOXAPARIN SODIUM 40 MG/0.4ML ~~LOC~~ SOLN: 40.0000 mg | SUBCUTANEOUS | 0 refills | Status: DC

## 2019-09-16 MED ORDER — ONDANSETRON HCL 4 MG PO TABS: 4.0000 mg | ORAL_TABLET | Freq: Four times a day (QID) | ORAL | 0 refills | Status: DC | PRN

## 2019-09-16 NOTE — Progress Notes (Signed)
  Subjective: 1 Day Post-Op Procedure(s) (LRB): INTRAMEDULLARY (IM) NAIL INTERTROCHANTRIC (Left) Patient reports pain as mild.   Patient is well, and has had no acute complaints or problems Plan is to go Home after hospital stay. Negative for chest pain and shortness of breath Fever: no Gastrointestinal: Negative for nausea and vomiting  Objective: Vital signs in last 24 hours: Temp:  [97.8 F (36.6 C)-98.6 F (37 C)] 98 F (36.7 C) (08/01 0344) Pulse Rate:  [96-114] 97 (08/01 0344) Resp:  [14-20] 18 (08/01 0344) BP: (113-171)/(71-102) 115/73 (08/01 0344) SpO2:  [94 %-100 %] 96 % (08/01 0344)  Intake/Output from previous day:  Intake/Output Summary (Last 24 hours) at 09/16/2019 0737 Last data filed at 09/16/2019 0417 Gross per 24 hour  Intake 1360 ml  Output 1050 ml  Net 310 ml    Intake/Output this shift: No intake/output data recorded.  Labs: Recent Labs    09/14/19 2002 09/15/19 0424 09/16/19 0531  HGB 13.6 12.6* 11.0*   Recent Labs    09/15/19 0424 09/16/19 0531  WBC 7.2 8.3  RBC 3.57* 3.04*  HCT 36.4* 30.8*  PLT 130* 124*   Recent Labs    09/15/19 1752 09/16/19 0531  NA 142 139  K 4.0 4.2  CL 102 101  CO2 27 30  BUN 10 13  CREATININE 1.26* 1.31*  GLUCOSE 176* 150*  CALCIUM 8.1* 7.7*   Recent Labs    09/14/19 2002  INR 1.0     EXAM General - Patient is Alert and Oriented Extremity - Neurovascular intact Sensation intact distally Dorsiflexion/Plantar flexion intact Compartment soft Dressing/Incision - clean, dry, no drainage Motor Function - intact, moving foot and toes well on exam.   Past Medical History:  Diagnosis Date  . Diabetes mellitus without complication ([place])   . Hypertension     Assessment/Plan: 1 Day Post-Op Procedure(s) (LRB): INTRAMEDULLARY (IM) NAIL INTERTROCHANTRIC (Left) Principal Problem:   Closed left hip fracture, initial encounter (HCC) Active Problems:   Diabetes mellitus without complication (HCC)    Chronic kidney disease, stage 3a   Hypertension   Alcohol dependence ([place])  Estimated body mass index is 25.09 kg/m as calculated from the following:   Height as of this encounter: 5\' 8"  (1.727 m).   Weight as of this encounter: 74.8 kg. Advance diet Up with therapy D/C IV fluids  DVT Prophylaxis - Lovenox, Foot Pumps and TED hose Weight-Bearing as tolerated to left leg  Reche Dixon, PA-C Orthopaedic Surgery 09/16/2019, 7:37 AM

## 2019-09-16 NOTE — Evaluation (Addendum)
Occupational Therapy Evaluation Patient Details Name: Corey Gonzalez MRN: [id number] DOB: [birth year] Today's Date: 09/16/2019    History of Present Illness Corey Gonzalez is a 79 y.o. male with medical history significant for diabetes mellitus, hypertension, renal insufficiency, and alcohol dependence, now presenting to the emergency department with severe left hip pain after fall at home.  Patient reports that he was in his usual state of health and was cleaning his dog when he slipped and fell onto his left side without hitting his head or losing consciousness.He reports chronic right leg swelling that is unchanged. Now s/p L IM nailing of L femur 09/15/19.   Clinical Impression   Mr Swails was seen for OT/PT co-evaluation this date. Prior to hospital admission, pt was Independent c mobility and ADLs including caring for his 2 dogs. Pt lives c his significant other who is available 24/7 but cannot provide physical assist. Pt is waiting to hear if her sister in law or daughters can assist at home. Pt presents to acute OT demonstrating impaired ADL performance and functional mobility 2/2 decreased LB access, functional strength/ROM/balance deficits, pain, and decreased activity tolerance. Upon entry pt in 0/10 pain and agreeable to session. Pt required MAX A for LBD at bed level. MOD A to exit L side of bed - assist for LLE mgmt and trunk elevation. MIN A x2 + RW sit<>stand at EOB decreasing to CGA + RW for ~20 ft to raised BSC over toilet, +2 for lines mgmt. Pt required TOTAL for perihygiene in standing - pt noted to have blood from hemorrhoids.   Pt became increasingly pain limited t/o session stating 7/10 pain - RN notified. Upon attempting to return to bed pt increasingly analgesic gait and requiring standing rest break after each step. No recliner in room - PT brought low bed as close to bathroom as able. Pt required MOD A + RW for ~10 ft toilet to bed c assist to offload weight on LLE during mobility and  standing rest breaks. MAX A + RW stand>sit at bed. TOTAL A x2 sit>sup. Pt would benefit from skilled OT to address noted impairments and functional limitations (see below for any additional details) in order to maximize safety and independence while minimizing falls risk and caregiver burden. Upon hospital discharge, recommend STR to maximize pt safety and return to PLOF.     Follow Up Recommendations  SNF (may progress)    Equipment Recommendations  3 in 1 bedside commode    Recommendations for Other Services       Precautions / Restrictions Precautions Precautions: Fall Restrictions Weight Bearing Restrictions: Yes LLE Weight Bearing: Weight bearing as tolerated      Mobility Bed Mobility Overal bed mobility: Needs Assistance Bed Mobility: Supine to Sit;Sit to Supine;Rolling Rolling: Min assist   Supine to sit: Mod assist Sit to supine: Max assist;+2 for physical assistance   General bed mobility comments: MIN A for LLE mgmt rolling R side. Assist for LLE mgmt sup>sit  Transfers Overall transfer level: Needs assistance Equipment used: Rolling walker (2 wheeled) Transfers: Sit to/from Stand Sit to Stand: Min assist;+2 physical assistance;+2 safety/equipment         General transfer comment: MIN A x2 + RW sit<>stand at bed and BSC over toilet. CGA + RW +2 for lines/leads mgmt for ~30 ft in room mobility    Balance Overall balance assessment: Needs assistance Sitting-balance support: No upper extremity supported;Feet supported Sitting balance-Leahy Scale: Good     Standing balance support:  Bilateral upper extremity supported Standing balance-Leahy Scale: Fair                             ADL either performed or assessed with clinical judgement   ADL Overall ADL's : Needs assistance/impaired                                       General ADL Comments: MAX A for LBD at bed level. MIN A x2 + RW for ADL t/f. TOTAL A for perihygiene in  standing.      Vision         Perception     Praxis      Pertinent Vitals/Pain Pain Assessment: 0-10 Pain Score: 7  Pain Location: L hip Pain Descriptors / Indicators: Grimacing;Operative site guarding;Discomfort;Aching Pain Intervention(s): Limited activity within patient's tolerance;Repositioned;Patient requesting pain meds-RN notified     Hand Dominance Right   Extremity/Trunk Assessment Upper Extremity Assessment Upper Extremity Assessment: Overall WFL for tasks assessed   Lower Extremity Assessment Lower Extremity Assessment: LLE deficits/detail LLE: Unable to fully assess due to pain       Communication Communication Communication: No difficulties   Cognition Arousal/Alertness: Awake/alert Behavior During Therapy: WFL for tasks assessed/performed Overall Cognitive Status: Within Functional Limits for tasks assessed                                     General Comments  After mobility reclined in bed: SpO2 98% on RA, HR 107.     Exercises Exercises: Other exercises Other Exercises Other Exercises: Pt educated re: OT role, DME recs, d/c recs, pain mgmt, falls prevention, ECS, home/routines modifications, safe RW technique Other Exercises: Toileting, LBD, UBD, sup<>sit, sit<>stand x2, sitting/standing balance/tolerance, perihygiene   Shoulder Instructions      Home Living Family/patient expects to be discharged to:: Private residence Living Arrangements: Spouse/significant other Available Help at Discharge: Family;Available 24 hours/day Type of Home: House Home Access: Level entry     Home Layout: One level     Bathroom Shower/Tub: Occupational psychologist: Standard     Home Equipment: Clinical cytogeneticist - 2 wheels;Cane - single point;Wheelchair - manual;Toilet riser   Additional Comments: lives with his "lady friend" and her sister in law is retired Marine scientist who can assist PRN. Significant other unabale to physically assist  but her DTRs PRN can       Prior Functioning/Environment Level of Independence: Independent        Comments: Walks w/o AD for community mobility, insluding caring for 2 small dogs        OT Problem List: Decreased strength;Decreased range of motion;Decreased activity tolerance;Impaired balance (sitting and/or standing);Decreased safety awareness;Decreased knowledge of use of DME or AE      OT Treatment/Interventions: Self-care/ADL training;Therapeutic exercise;Energy conservation;DME and/or AE instruction;Therapeutic activities;Patient/family education;Balance training    OT Goals(Current goals can be found in the care plan section) Acute Rehab OT Goals Patient Stated Goal: To control pain  OT Goal Formulation: With patient Time For Goal Achievement: 09/30/19 Potential to Achieve Goals: Good ADL Goals Pt Will Perform Grooming: with modified independence;sitting Pt Will Perform Lower Body Dressing: with min assist;with caregiver independent in assisting;with adaptive equipment;sit to/from stand (c LRAD PRN) Pt Will Transfer to Toilet: with supervision;ambulating;regular height  toilet;grab bars (c LRAD PRN) Pt Will Perform Toileting - Clothing Manipulation and hygiene: with supervision;sit to/from stand (c LRAD PRN)  OT Frequency: Min 2X/week   Barriers to D/C: Inaccessible home environment;Decreased caregiver support          Co-evaluation PT/OT/SLP Co-Evaluation/Treatment: Yes Reason for Co-Treatment: For patient/therapist safety;To address functional/ADL transfers   OT goals addressed during session: ADL's and self-care;Proper use of Adaptive equipment and DME      AM-PAC OT "6 Clicks" Daily Activity     Outcome Measure Help from another person eating meals?: None Help from another person taking care of personal grooming?: A Little Help from another person toileting, which includes using toliet, bedpan, or urinal?: A Lot Help from another person bathing (including  washing, rinsing, drying)?: A Lot Help from another person to put on and taking off regular upper body clothing?: A Little Help from another person to put on and taking off regular lower body clothing?: A Lot 6 Click Score: 16   End of Session Equipment Utilized During Treatment: Rolling walker Nurse Communication: Patient requests pain meds  Activity Tolerance: Patient limited by pain;Patient tolerated treatment well Patient left: in bed;with call bell/phone within reach;with bed alarm set;with nursing/sitter in room (RN in room at end of session)  OT Visit Diagnosis: Other abnormalities of gait and mobility (R26.89)                Time: 0940-7680 OT Time Calculation (min): 42 min Charges:  OT General Charges $OT Visit: 1 Visit OT Evaluation $OT Eval Moderate Complexity: 1 Mod OT Treatments $Self Care/Home Management : 8-22 mins  Dessie Coma, M.S. OTR/L  09/16/19, 10:33 AM  ascom [phone]

## 2019-09-16 NOTE — Progress Notes (Addendum)
Physical Therapy Evaluation Patient Details Name: Corey Gonzalez MRN: [id number] DOB: [birth year] Today's Date: 09/16/2019   History of Present Illness  Corey Gonzalez is a 79 y.o. male with medical history significant for diabetes mellitus, hypertension, renal insufficiency, and alcohol dependence, now presenting to the emergency department with severe left hip pain after fall at home.  Patient reports that he was in his usual state of health and was cleaning his dog when he slipped and fell onto his left side without hitting his head or losing consciousness.He reports chronic right leg swelling that is unchanged. Now s/p L IM nailing of L femur 09/15/19.  Clinical Impression  Patient agrees to PT eval. Patient has 1/5 LLE hip flex and -3/5 L knee extension strength. Patient has good sitting balance and good standing balance with UE support with RW. Patient ambulates WBAT to bathroom with step to gait and pain getting severe on the way back to bed with + 2 assist for IV. Patient had severe pain ambulating from bathroom to bed and PT needed to roll the bed closer to the bathroom to allow patient to sit down sooner; recliner was missing from the room.   Patient is min a with bed mobility, min assist for transfers sit to stand with RW. Patient ambulates with RW 20 feet with min assist.  Patient will continue to benefit from skilled PT to improve mobility and strength.    Follow Up Recommendations SNF    Equipment Recommendations  Rolling walker with 5" wheels    Recommendations for Other Services       Precautions / Restrictions Precautions Precautions: Fall Restrictions Weight Bearing Restrictions: Yes LLE Weight Bearing: Weight bearing as tolerated      Mobility  Bed Mobility Overal bed mobility: Needs Assistance Bed Mobility: Supine to Sit;Sit to Supine;Rolling Rolling: Min assist   Supine to sit: Mod assist Sit to supine: Max assist;+2 for physical assistance   General bed mobility  comments: MIN A for LLE mgmt rolling R side. Assist for LLE mgmt sup>sit  Transfers Overall transfer level: Needs assistance Equipment used: Rolling walker (2 wheeled) Transfers: Sit to/from Stand Sit to Stand: Min assist;+2 physical assistance;+2 safety/equipment         General transfer comment: MIN A x2 + RW sit<>stand at bed and BSC over toilet. CGA + RW +2 for lines/leads mgmt for ~30 ft in room mobility  Ambulation/Gait Ambulation/Gait assistance: Mod assist Gait Distance (Feet): 20 Feet Assistive device: Rolling walker (2 wheeled) Gait Pattern/deviations: Step-to pattern        Stairs            Wheelchair Mobility    Modified Rankin (Stroke Patients Only)       Balance Overall balance assessment: Needs assistance Sitting-balance support: No upper extremity supported;Feet supported Sitting balance-Leahy Scale: Good     Standing balance support: Bilateral upper extremity supported Standing balance-Leahy Scale: Fair                               Pertinent Vitals/Pain Pain Assessment: 0-10 Pain Score: 7  Pain Location:  (l hip) Pain Descriptors / Indicators: Aching;Crying;Grimacing Pain Intervention(s): Limited activity within patient's tolerance    Home Living Family/patient expects to be discharged to:: Private residence Living Arrangements: Spouse/significant other Available Help at Discharge: Family;Available 24 hours/day Type of Home: House Home Access: Level entry     Home Layout: One level Home Equipment: Clinical cytogeneticist -  2 wheels;Cane - single point;Wheelchair - manual;Toilet riser Additional Comments: lives with his "lady friend" and her sister in law is retired Marine scientist who can assist PRN. Significant other unabale to physically assist but her DTRs PRN can     Prior Function Level of Independence: Independent         Comments: Walks w/o AD for community mobility, insluding caring for 2 small dogs     Hand Dominance    Dominant Hand: Right    Extremity/Trunk Assessment   Upper Extremity Assessment Upper Extremity Assessment: Overall WFL for tasks assessed    Lower Extremity Assessment Lower Extremity Assessment: LLE deficits/detail LLE Deficits / Details: l hip 1/5 flex, abd, L knee -3/5extension LLE: Unable to fully assess due to pain       Communication   Communication: No difficulties  Cognition Arousal/Alertness: Awake/alert Behavior During Therapy: WFL for tasks assessed/performed Overall Cognitive Status: Within Functional Limits for tasks assessed                                        General Comments General comments (skin integrity, edema, etc.): After mobility reclined in bed: SpO2 98% on RA, HR 107.     Exercises Other Exercises Other Exercises: Pt educated re: OT role, DME recs, d/c recs, pain mgmt, falls prevention, ECS, home/routines modifications, safe RW technique Other Exercises: Toileting, LBD, UBD, sup<>sit, sit<>stand x2, sitting/standing balance/tolerance, perihygiene   Assessment/Plan    PT Assessment Patient needs continued PT services  PT Problem List Decreased strength;Decreased activity tolerance;Decreased mobility;Pain       PT Treatment Interventions Gait training;Therapeutic activities;Therapeutic exercise;Balance training;Patient/family education    PT Goals (Current goals can be found in the Care Plan section)  Acute Rehab PT Goals Patient Stated Goal: to move better PT Goal Formulation: Patient unable to participate in goal setting Time For Goal Achievement: 09/30/19 Potential to Achieve Goals: Good    Frequency BID   Barriers to discharge        Co-evaluation   Reason for Co-Treatment: For patient/therapist safety   OT goals addressed during session: Strengthening/ROM       AM-PAC PT "6 Clicks" Mobility  Outcome Measure Help needed turning from your back to your side while in a flat bed without using bedrails?: A  Little Help needed moving from lying on your back to sitting on the side of a flat bed without using bedrails?: A Little Help needed moving to and from a bed to a chair (including a wheelchair)?: A Lot Help needed standing up from a chair using your arms (e.g., wheelchair or bedside chair)?: A Lot Help needed to walk in hospital room?: A Lot Help needed climbing 3-5 steps with a railing? : Total 6 Click Score: 13    End of Session Equipment Utilized During Treatment: Gait belt Activity Tolerance: Patient limited by fatigue;Patient limited by pain Patient left: in bed;with bed alarm set Nurse Communication: Mobility status PT Visit Diagnosis: Unsteadiness on feet (R26.81);Muscle weakness (generalized) (M62.81);Difficulty in walking, not elsewhere classified (R26.2);Pain Pain - Right/Left: Left Pain - part of body: Hip    Time: 2585-2778 PT Time Calculation (min) (ACUTE ONLY): 42 min   Charges:   PT Evaluation $PT Eval Low Complexity: 1 Low PT Treatments $Gait Training: 23-37 mins $Therapeutic Activity: 8-22 mins         Arelia Sneddon S,PT DPT 09/16/2019, 11:27 AM

## 2019-09-16 NOTE — Discharge Instructions (Signed)
INSTRUCTIONS AFTER Surgery  o Remove items at home which could result in a fall. This includes throw rugs or furniture in walking pathways o ICE to the affected joint every three hours while awake for 30 minutes at a time, for at least the first 3-5 days, and then as needed for pain and swelling.  Continue to use ice for pain and swelling. You may notice swelling that will progress down to the foot and ankle.  This is normal after surgery.  Elevate your leg when you are not up walking on it.   o Continue to use the breathing machine you got in the hospital (incentive spirometer) which will help keep your temperature down.  It is common for your temperature to cycle up and down following surgery, especially at night when you are not up moving around and exerting yourself.  The breathing machine keeps your lungs expanded and your temperature down.   DIET:  As you were doing prior to hospitalization, we recommend a well-balanced diet.  DRESSING / WOUND CARE / SHOWERING  Dressing change as needed. Keep dry. Staples will be removed in 2 weeks at [place]  o Increase activity slowly as tolerated, but follow the weight bearing instructions below.   o No driving for 6 weeks or until further direction given by your physician.  You cannot drive while taking narcotics.  o No lifting or carrying greater than 10 lbs. until further directed by your surgeon. o Avoid periods of inactivity such as sitting longer than an hour when not asleep. This helps prevent blood clots.  o You may return to work once you are authorized by your doctor.     WEIGHT BEARING  Weightbearing as tolerated on the left   EXERCISES Gait training and ambulation training  CONSTIPATION  Constipation is defined medically as fewer than three stools per week and severe constipation as less than one stool per week.  Even if you have a regular bowel pattern at home, your normal regimen is likely to be disrupted due  to multiple reasons following surgery.  Combination of anesthesia, postoperative narcotics, change in appetite and fluid intake all can affect your bowels.   YOU MUST use at least one of the following options; they are listed in order of increasing strength to get the job done.  They are all available over the counter, and you may need to use some, POSSIBLY even all of these options:    Drink plenty of fluids (prune juice may be helpful) and high fiber foods Colace 100 mg by mouth twice a day  Senokot for constipation as directed and as needed Dulcolax (bisacodyl), take with full glass of water  Miralax (polyethylene glycol) once or twice a day as needed.  If you have tried all these things and are unable to have a bowel movement in the first 3-4 days after surgery call either your surgeon or your primary doctor.    If you experience loose stools or diarrhea, hold the medications until you stool forms back up.  If your symptoms do not get better within 1 week or if they get worse, check with your doctor.  If you experience "the worst abdominal pain ever" or develop nausea or vomiting, please contact the office immediately for further recommendations for treatment.   ITCHING:  If you experience itching with your medications, try taking only a single pain pill, or even half a pain pill at a time.  You can also use Benadryl  over the counter for itching or also to help with sleep.   TED HOSE STOCKINGS:  Use stockings on both legs until for at least 2 weeks or as directed by physician office. They may be removed at night for sleeping.  MEDICATIONS:  See your medication summary on the "After Visit Summary" that nursing will review with you.  You may have some home medications which will be placed on hold until you complete the course of blood thinner medication.  It is important for you to complete the blood thinner medication as prescribed.  PRECAUTIONS:  If you experience chest pain or shortness of  breath - call 911 immediately for transfer to the hospital emergency department.   If you develop a fever greater that 101 F, purulent drainage from wound, increased redness or drainage from wound, foul odor from the wound/dressing, or calf pain - CONTACT YOUR SURGEON.                                                   FOLLOW-UP APPOINTMENTS:  If you do not already have a post-op appointment, please call the office for an appointment to be seen by your surgeon.  Guidelines for how soon to be seen are listed in your "After Visit Summary", but are typically between 1-4 weeks after surgery.  OTHER INSTRUCTIONS:     MAKE SURE YOU:  . Understand these instructions.  . Get help right away if you are not doing well or get worse.    Thank you for letting us be a part of your medical care team.  It is a privilege we respect greatly.  We hope these instructions will help you stay on track for a fast and full recovery!

## 2019-09-16 NOTE — Progress Notes (Signed)
PROGRESS NOTE    Corey Gonzalez  XAJ:[id number] DOB: October 27, 1940 DOA: 09/14/2019 PCP: Patient, No Pcp Per   Brief Narrative:Corey Gonzalez is a 79 y.o. male with medical history significant for diabetes mellitus, hypertension, renal insufficiency, and alcohol dependence, now presenting to the emergency department with severe left hip pain after fall at home.  Patient reports that he was in his usual state of health and was cleaning his dog when he slipped and fell onto his left side without hitting his head or losing consciousness.  He was experiencing immediate and severe pain at the left hip and a deformity was noted.  EMS was called and the patient was treated with 100 mcg of fentanyl prior to arrival in the ED.  He denies any recent illness, reports that he is fairly active at baseline and never experiences chest pain with exertion.  He has not been short of breath, coughing, or experiencing any subjective fevers or chills.  He reports drinking 5 to 6 ounces of liquor every night but denies any history of withdrawal.  He reports being prescribed antihypertensives and diabetes medications in the past but has elected to not take any medications. He reports chronic right leg swelling that is unchanged.   ED Course: Upon arrival to the ED, patient is found to be afebrile, saturating well on room air, and with stable blood pressure.  EKG features sinus tachycardia with rate 101 and first-degree AV nodal block.  Chest x-ray demonstrates mild cardiomegaly without acute findings.  Radiographs of the hip and femur demonstrate comminuted left intertrochanteric femur fracture.  Chemistry panel is notable for glucose 194, potassium 2.6, and creatinine 1.41.  CBC with mild thrombocytopenia and a macrocytosis without anemia.  COVID-19 screening test is negative.  Orthopedic surgery was consulted by the ED physician and recommends medical admission.  Patient was given a liter of IV fluids, fentanyl, acetaminophen, and oral  potassium in the ED.  Assessment & Plan:   Principal Problem:   Closed left hip fracture, initial encounter [facility]) Active Problems:   Diabetes mellitus without complication ([place])   Chronic kidney disease, stage 3a   Hypertension   Alcohol dependence ([place])   #1 status post fall and left hip fracture-status post IM nailing 09/15/2019.  Continue pain control and DVT prophylaxis per Ortho.  Patient is anxious to go home.  PT is recommending SNF.   #2 type 2 diabetes continue SSI.  Pharmacy consulted for medication reconciliation as no home medications are noted on admission. CBG (last 3)  Recent Labs    09/16/19 0340 09/16/19 0814 09/16/19 1140  GLUCAP 160* 125* 223*    #3 hypokalemia-today potassium 4.2.    #4 history of essential hypertension-blood pressure 147/93.  Will restart home meds once medications have been reconciled.  #5 alcohol abuse he drinks 5 to 6 ounces of liquor every night.  Watch for withdrawals.  #6 history of CKD stage II creatinine 1.31 remained stable   Nutrition Problem: Increased nutrient needs Etiology: post-op healing, hip fracture     Signs/Symptoms: estimated needs    Interventions: Ensure Enlive (each supplement provides 350kcal and 20 grams of protein), MVI, Juven  Estimated body mass index is 25.09 kg/m as calculated from the following:   Height as of this encounter: 5\' 8"  (1.727 m).   Weight as of this encounter: 74.8 kg.  DVT prophylaxis: SCD Code Status: Full code Family Communication: None at bedside  disposition Plan:  Status is: Inpatient  Dispo: The patient is  from: Home              Anticipated d/c is to: SNF              Anticipated d/c date is: > 3 days              Patient currently is not medically stable to d/c.  Patient admitted with acute hip fracture  PT eval pending  Consultants: Ortho  Procedures: None Antimicrobials: None  Subjective: Patient resting in bed he is awake and alert in no acute  distress  Objective: Vitals:   09/15/19 2150 09/16/19 0021 09/16/19 0344 09/16/19 0815  BP: (!) 141/87 (!) 131/82 115/73 (!) 147/93  Pulse: (!) 114 104 97 97  Resp: 18 18 18 18   Temp: 98.5 F (36.9 C) 98.3 F (36.8 C) 98 F (36.7 C) 98.3 F (36.8 C)  TempSrc: Oral Oral Oral Oral  SpO2: 95% 95% 96% 97%  Weight:      Height:        Intake/Output Summary (Last 24 hours) at 09/16/2019 1147 Last data filed at 09/16/2019 1006 Gross per 24 hour  Intake 1360 ml  Output 1450 ml  Net -90 ml   Filed Weights   09/14/19 1946  Weight: 74.8 kg    Examination:  General exam: Appears calm and comfortable  Respiratory system: Clear to auscultation. Respiratory effort normal. Cardiovascular system: S1 & S2 heard, RRR. No JVD, murmurs, rubs, gallops or clicks. No pedal edema. Gastrointestinal system: Abdomen is nondistended, soft and nontender. No organomegaly or masses felt. Normal bowel sounds heard. Central nervous system: Alert and oriented. No focal neurological deficits. Extremities: tender left hip Skin: No rashes, lesions or ulcers Psychiatry: Judgement and insight appear normal. Mood & affect appropriate.     Data Reviewed: I have personally reviewed following labs and imaging studies  CBC: Recent Labs  Lab 09/14/19 2002 09/15/19 0424 09/16/19 0531  WBC 5.5 7.2 8.3  NEUTROABS 3.6  --   --   HGB 13.6 12.6* 11.0*  HCT 39.4 36.4* 30.8*  MCV 101.8* 102.0* 101.3*  PLT 142* 130* 149*   Basic Metabolic Panel: Recent Labs  Lab 09/14/19 2002 09/15/19 0424 09/15/19 1752 09/16/19 0531  NA 139 141 142 139  K 2.6* 3.2* 4.0 4.2  CL 98 100 102 101  CO2 28 29 27 30   GLUCOSE 194* 161* 176* 150*  BUN 10 8 10 13   CREATININE 1.41* 1.22 1.26* 1.31*  CALCIUM 8.3* 8.2* 8.1* 7.7*  MG  --  1.8  --   --    GFR: Estimated Creatinine Clearance: 45 mL/min (A) (by C-G formula based on SCr of 1.31 mg/dL (H)). Liver Function Tests: Recent Labs  Lab 09/14/19 2002 09/15/19 0424  AST  29 25  ALT 17 15  ALKPHOS 71 65  BILITOT 0.8 0.9  PROT 7.1 6.5  ALBUMIN 3.5 3.2*   No results for input(s): LIPASE, AMYLASE in the last 168 hours. No results for input(s): AMMONIA in the last 168 hours. Coagulation Profile: Recent Labs  Lab 09/14/19 2002  INR 1.0   Cardiac Enzymes: No results for input(s): CKTOTAL, CKMB, CKMBINDEX, TROPONINI in the last 168 hours. BNP (last 3 results) No results for input(s): PROBNP in the last 8760 hours. HbA1C: Recent Labs    09/15/19 0424  HGBA1C 7.5*   CBG: Recent Labs  Lab 09/15/19 1201 09/16/19 0026 09/16/19 0340 09/16/19 0814 09/16/19 1140  GLUCAP 140* 268* 160* 125* 223*   Lipid Profile: No  results for input(s): CHOL, HDL, LDLCALC, TRIG, CHOLHDL, LDLDIRECT in the last 72 hours. Thyroid Function Tests: No results for input(s): TSH, T4TOTAL, FREET4, T3FREE, THYROIDAB in the last 72 hours. Anemia Panel: No results for input(s): VITAMINB12, FOLATE, FERRITIN, TIBC, IRON, RETICCTPCT in the last 72 hours. Sepsis Labs: No results for input(s): PROCALCITON, LATICACIDVEN in the last 168 hours.  Recent Results (from the past 240 hour(s))  SARS Coronavirus 2 by RT PCR (hospital order, performed in [facility] hospital lab) Nasopharyngeal Nasopharyngeal Swab     Status: None   Collection Time: 09/14/19  8:02 PM   Specimen: Nasopharyngeal Swab  Result Value Ref Range Status   SARS Coronavirus 2 NEGATIVE NEGATIVE Final    Comment: (NOTE) SARS-CoV-2 target nucleic acids are NOT DETECTED.  The SARS-CoV-2 RNA is generally detectable in upper and lower respiratory specimens during the acute phase of infection. The lowest concentration of SARS-CoV-2 viral copies this assay can detect is 250 copies / mL. A negative result does not preclude SARS-CoV-2 infection and should not be used as the sole basis for treatment or other patient management decisions.  A negative result may occur with improper specimen collection / handling, submission  of specimen other than nasopharyngeal swab, presence of viral mutation(s) within the areas targeted by this assay, and inadequate number of viral copies (<250 copies / mL). A negative result must be combined with clinical observations, patient history, and epidemiological information.  Fact Sheet for Patients:   StrictlyIdeas.no  Fact Sheet for Healthcare Providers: BankingDealers.co.za  This test is not yet approved or  cleared by the [place] FDA and has been authorized for detection and/or diagnosis of SARS-CoV-2 by FDA under an Emergency Use Authorization (EUA).  This EUA will remain in effect (meaning this test can be used) for the duration of the COVID-19 declaration under Section 564(b)(1) of the Act, 21 U.S.C. section 360bbb-3(b)(1), unless the authorization is terminated or revoked sooner.  Performed at [facility], [address], [place], [place] [address]   Surgical pcr screen     Status: None   Collection Time: 09/14/19 11:01 PM   Specimen: Nasal Mucosa; Nasal Swab  Result Value Ref Range Status   MRSA, PCR NEGATIVE NEGATIVE Final   Staphylococcus aureus NEGATIVE NEGATIVE Final    Comment: (NOTE) The Xpert SA Assay (FDA approved for NASAL specimens in patients 17 years of age and older), is one component of a comprehensive surveillance program. It is not intended to diagnose infection nor to guide or monitor treatment. Performed at [facility], [address], [place], [place] [address]          Radiology Studies: DG Chest Portable 1 View  Result Date: 09/14/2019 CLINICAL DATA:  79 year old male status post fall at home. Left femur fracture. EXAM: PORTABLE CHEST 1 VIEW COMPARISON:  None. FINDINGS: Portable AP semi upright view at 2011 hours. Lordotic view. Mild cardiomegaly. Mildly tortuous thoracic aorta. Visualized tracheal air column is within normal limits. Allowing for portable  technique the lungs are clear. Negative visible bowel gas pattern. No acute osseous abnormality identified. IMPRESSION: Mild cardiomegaly.  No acute cardiopulmonary abnormality. Electronically Signed   By: Genevie Ann M.D.   On: 09/14/2019 20:28   DG HIP OPERATIVE UNILAT W OR W/O PELVIS LEFT  Result Date: 09/15/2019 CLINICAL DATA:  Left hip IM nail. EXAM: OPERATIVE LEFT HIP (WITH PELVIS IF PERFORMED) TECHNIQUE: Fluoroscopic spot image(s) were submitted for interpretation post-operatively. COMPARISON:  Radiographs yesterday FINDINGS: Six fluoroscopic spot views  of the left proximal femur obtained in the operating room. Intertrochanteric femur fracture with subsequent intramedullary nail, trans trochanteric and distal locking screw fixation. Total fluoroscopy time 1 minutes 47 seconds. IMPRESSION: Procedural fluoroscopy during left proximal femur fracture ORIF. Electronically Signed   By: Keith Rake M.D.   On: 09/15/2019 16:43   DG HIP UNILAT WITH PELVIS 2-3 VIEWS LEFT  Result Date: 09/15/2019 CLINICAL DATA:  Left hip IM nail. EXAM: DG HIP (WITH OR WITHOUT PELVIS) 2-3V LEFT COMPARISON:  Preoperative radiograph yesterday. FINDINGS: Intramedullary nail with trans trochanteric and distal locking screw fixation traverse intertrochanteric femur fracture. The fracture is in improved alignment compared to preoperative imaging. There is residual displacement involving the lesser trochanter. Recent postsurgical change includes air and edema in the soft tissues with lateral skin staples. Right hip arthroplasty is intact were visualized. There are advanced vascular calcifications. IMPRESSION: ORIF intertrochanteric left femur fracture without immediate postoperative complication. Electronically Signed   By: Keith Rake M.D.   On: 09/15/2019 18:07   DG Hip Unilat W or Wo Pelvis 2-3 Views Left  Result Date: 09/14/2019 CLINICAL DATA:  79 year old male status post fall at home. EXAM: DG HIP (WITH OR WITHOUT  PELVIS) 2-3V LEFT; LEFT FEMUR 2 VIEWS COMPARISON:  None. FINDINGS: Left hip: Comminuted left femur intertrochanteric fracture with mild varus impaction. Left femoral head remains normally located. Superimposed right total hip arthroplasty. No pelvis fracture identified. Grossly intact visible proximal femur. Superimposed iliofemoral calcified atherosclerosis. Negative visible lower abdominal and pelvic visceral contours. Left femur: Distal to the intertrochanteric segment the left femur appears intact. Superimposed left total knee arthroplasty appears aligned. Calcified peripheral vascular disease in the left lower extremity. IMPRESSION: 1. Comminuted left femur intertrochanteric fracture with mild varus impaction. 2. No other acute fracture or dislocation identified about the left femur or pelvis. 3. Right total hip arthroplasty, left knee arthroplasty. 4. Calcified atherosclerosis, peripheral vascular disease. Electronically Signed   By: Genevie Ann M.D.   On: 09/14/2019 20:27   DG Femur Min 2 Views Left  Result Date: 09/14/2019 CLINICAL DATA:  79 year old male status post fall at home. EXAM: DG HIP (WITH OR WITHOUT PELVIS) 2-3V LEFT; LEFT FEMUR 2 VIEWS COMPARISON:  None. FINDINGS: Left hip: Comminuted left femur intertrochanteric fracture with mild varus impaction. Left femoral head remains normally located. Superimposed right total hip arthroplasty. No pelvis fracture identified. Grossly intact visible proximal femur. Superimposed iliofemoral calcified atherosclerosis. Negative visible lower abdominal and pelvic visceral contours. Left femur: Distal to the intertrochanteric segment the left femur appears intact. Superimposed left total knee arthroplasty appears aligned. Calcified peripheral vascular disease in the left lower extremity. IMPRESSION: 1. Comminuted left femur intertrochanteric fracture with mild varus impaction. 2. No other acute fracture or dislocation identified about the left femur or pelvis. 3.  Right total hip arthroplasty, left knee arthroplasty. 4. Calcified atherosclerosis, peripheral vascular disease. Electronically Signed   By: Genevie Ann M.D.   On: 09/14/2019 20:27        Scheduled Meds: . acetaminophen  1,000 mg Oral Q8H  . Chlorhexidine Gluconate Cloth  6 each Topical Daily  . docusate sodium  100 mg Oral BID  . enoxaparin (LOVENOX) injection  40 mg Subcutaneous Q24H  . feeding supplement (ENSURE ENLIVE)  237 mL Oral BID BM  . folic acid  1 mg Oral Daily  . insulin aspart  0-9 Units Subcutaneous Q4H  . multivitamin with minerals  1 tablet Oral Daily  . nutrition supplement (JUVEN)  1 packet Oral BID  BM  . thiamine  100 mg Oral Daily   Or  . thiamine  100 mg Intravenous Daily   Continuous Infusions: . sodium chloride 75 mL/hr at 09/15/19 2110  . methocarbamol (ROBAXIN) IV       LOS: 2 days     Georgette Shell, MD  09/16/2019, 11:47 AM

## 2019-09-17 LAB — CBC
HCT: 28.3 % — ABNORMAL LOW (ref 39.0–52.0)
Hemoglobin: 10.1 g/dL — ABNORMAL LOW (ref 13.0–17.0)
MCH: 35.9 pg — ABNORMAL HIGH (ref 26.0–34.0)
MCHC: 35.7 g/dL (ref 30.0–36.0)
MCV: 100.7 fL — ABNORMAL HIGH (ref 80.0–100.0)
Platelets: 107 10*3/uL — ABNORMAL LOW (ref 150–400)
RBC: 2.81 MIL/uL — ABNORMAL LOW (ref 4.22–5.81)
RDW: 13.7 % (ref 11.5–15.5)
WBC: 4.8 10*3/uL (ref 4.0–10.5)
nRBC: 0 % (ref 0.0–0.2)

## 2019-09-17 LAB — BASIC METABOLIC PANEL
Anion gap: 6 (ref 5–15)
BUN: 19 mg/dL (ref 8–23)
CO2: 31 mmol/L (ref 22–32)
Calcium: 7.8 mg/dL — ABNORMAL LOW (ref 8.9–10.3)
Chloride: 99 mmol/L (ref 98–111)
Creatinine, Ser: 1.41 mg/dL — ABNORMAL HIGH (ref 0.61–1.24)
GFR calc Af Amer: 55 mL/min — ABNORMAL LOW (ref >60)
GFR calc non Af Amer: 47 mL/min — ABNORMAL LOW (ref >60)
Glucose, Bld: 197 mg/dL — ABNORMAL HIGH (ref 70–99)
Potassium: 3.7 mmol/L (ref 3.5–5.1)
Sodium: 136 mmol/L (ref 135–145)

## 2019-09-17 LAB — GLUCOSE, CAPILLARY
Glucose-Capillary: 140 mg/dL — ABNORMAL HIGH (ref 70–99)
Glucose-Capillary: 164 mg/dL — ABNORMAL HIGH (ref 70–99)
Glucose-Capillary: 180 mg/dL — ABNORMAL HIGH (ref 70–99)
Glucose-Capillary: 188 mg/dL — ABNORMAL HIGH (ref 70–99)
Glucose-Capillary: 205 mg/dL — ABNORMAL HIGH (ref 70–99)
Glucose-Capillary: 217 mg/dL — ABNORMAL HIGH (ref 70–99)

## 2019-09-17 LAB — SARS CORONAVIRUS 2 BY RT PCR (HOSPITAL ORDER, PERFORMED IN ~~LOC~~ HOSPITAL LAB): SARS Coronavirus 2: NEGATIVE

## 2019-09-17 MED ORDER — INSULIN ASPART 100 UNIT/ML ~~LOC~~ SOLN
0.0000 [IU] | Freq: Every day | SUBCUTANEOUS | Status: DC
  Administered 2019-09-17: 2 [IU] via SUBCUTANEOUS
  Filled 2019-09-17: qty 1

## 2019-09-17 MED ORDER — INSULIN ASPART 100 UNIT/ML ~~LOC~~ SOLN
0.0000 [IU] | Freq: Three times a day (TID) | SUBCUTANEOUS | Status: DC
  Administered 2019-09-17: 2 [IU] via SUBCUTANEOUS
  Administered 2019-09-18: 3 [IU] via SUBCUTANEOUS
  Administered 2019-09-18: 1 [IU] via SUBCUTANEOUS
  Administered 2019-09-18: 3 [IU] via SUBCUTANEOUS
  Filled 2019-09-17 (×4): qty 1

## 2019-09-17 NOTE — TOC Transition Note (Signed)
Transition of Care [facility]) - CM/SW Discharge Note   Patient Details  Name: Corey Gonzalez MRN: [id number] Date of Birth: 11-06-1940  Transition [facility]) CM/SW Contact:  Su Hilt, RN Phone Number: 09/17/2019, 3:03 PM   Clinical Narrative:   Was notified by Gerald Stabs at [facility] that the patient has regualr Medicare part A and they can go to SNF on that, the patient has had 3 nights stay, I notified the physician that he can DC when ready          Patient Goals and CMS Choice        Discharge Placement                       Discharge Plan and Services                                     Social Determinants of Health (SDOH) Interventions     Readmission Risk Interventions No flowsheet data found.

## 2019-09-17 NOTE — NC FL2 (Signed)
[place] LEVEL OF CARE SCREENING TOOL     IDENTIFICATION  Patient Name: Corey Gonzalez Birthdate: 12-25-1940 Sex: male Admission Date (Current Location): 09/14/2019  [place] and [place] Number:  Engineering geologist and Address:  [facility], [address], [place], [place] [address]      Provider Number: [id number]  Attending Physician Name and Address:  Georgette Shell, MD  Relative Name and Phone Number:  Clement Husbands [phone]    Current Level of Care: Hospital Recommended Level of Care: [place] Prior Approval Number:    Date Approved/Denied:   PASRR Number: [id number]  Discharge Plan: SNF    Current Diagnoses: Patient Active Problem List   Diagnosis Date Noted  . Closed left hip fracture, initial encounter ([place]) 09/14/2019  . Diabetes mellitus without complication ([place])   . Chronic kidney disease, stage 3a   . Hypertension   . Alcohol dependence ([place])     Orientation RESPIRATION BLADDER Height & Weight     Self, Time, Situation, Place  Normal Continent Weight: 74.8 kg Height:  5\' 8"  (172.7 cm)  BEHAVIORAL SYMPTOMS/MOOD NEUROLOGICAL BOWEL NUTRITION STATUS      Continent Diet (regular)  AMBULATORY STATUS COMMUNICATION OF NEEDS Skin   Extensive Assist   Normal, Surgical wounds                       Personal Care Assistance Level of Assistance  Bathing, Feeding, Dressing Bathing Assistance: Limited assistance Feeding assistance: Limited assistance Dressing Assistance: Limited assistance     Functional Limitations Info             SPECIAL CARE FACTORS FREQUENCY  PT (By licensed PT)     PT Frequency: 5 times per week              Contractures Contractures Info: Not present    Additional Factors Info  Code Status, Allergies Code Status Info: full code Allergies Info: NKDA           Current Medications (09/17/2019):  This is the current hospital active medication  list Current Facility-Administered Medications  Medication Dose Route Frequency Provider Last Rate Last Admin  . bisacodyl (DULCOLAX) suppository 10 mg  10 mg Rectal Daily PRN Leim Fabry, MD      . Chlorhexidine Gluconate Cloth 2 % PADS 6 each  6 each Topical Daily Georgette Shell, MD   6 each at 09/16/19 [phone]  . docusate sodium (COLACE) capsule 100 mg  100 mg Oral BID Leim Fabry, MD   100 mg at 09/17/19 0858  . enoxaparin (LOVENOX) injection 40 mg  40 mg Subcutaneous Q24H Leim Fabry, MD   40 mg at 09/17/19 0853  . feeding supplement (ENSURE ENLIVE) (ENSURE ENLIVE) liquid 237 mL  237 mL Oral BID BM Leim Fabry, MD   237 mL at 09/17/19 0854  . folic acid (FOLVITE) tablet 1 mg  1 mg Oral Daily Leim Fabry, MD   1 mg at 09/17/19 0854  . HYDROmorphone (DILAUDID) injection 0.25-0.5 mg  0.25-0.5 mg Intravenous Q2H PRN Leim Fabry, MD      . insulin aspart (novoLOG) injection 0-9 Units  0-9 Units Subcutaneous Q4H Leim Fabry, MD   3 Units at 09/17/19 1204  . labetalol (NORMODYNE) injection 10 mg  10 mg Intravenous Q2H PRN Leim Fabry, MD      . LORazepam (ATIVAN) tablet 1-4 mg  1-4 mg Oral Q1H PRN Leim Fabry, MD  Or  . LORazepam (ATIVAN) injection 1-4 mg  1-4 mg Intravenous Q1H PRN Leim Fabry, MD      . methocarbamol (ROBAXIN) tablet 500 mg  500 mg Oral Q6H PRN Leim Fabry, MD       Or  . methocarbamol (ROBAXIN) 500 mg in dextrose 5 % 50 mL IVPB  500 mg Intravenous Q6H PRN Leim Fabry, MD      . metoCLOPramide (REGLAN) tablet 5-10 mg  5-10 mg Oral Q8H PRN Leim Fabry, MD       Or  . metoCLOPramide (REGLAN) injection 5-10 mg  5-10 mg Intravenous Q8H PRN Leim Fabry, MD      . multivitamin with minerals tablet 1 tablet  1 tablet Oral Daily Leim Fabry, MD   1 tablet at 09/17/19 0854  . nutrition supplement (JUVEN) (JUVEN) powder packet 1 packet  1 packet Oral BID BM Leim Fabry, MD   1 packet at 09/16/19 1507  . ondansetron (ZOFRAN) tablet 4 mg  4 mg Oral Q6H PRN Leim Fabry, MD       Or  . ondansetron [facility]) injection 4 mg  4 mg Intravenous Q6H PRN Leim Fabry, MD      . oxyCODONE (Oxy IR/ROXICODONE) immediate release tablet 2.5-5 mg  2.5-5 mg Oral Q3H PRN Leim Fabry, MD      . oxyCODONE (Oxy IR/ROXICODONE) immediate release tablet 5-10 mg  5-10 mg Oral Q4H PRN Leim Fabry, MD   10 mg at 09/17/19 1000  . senna-docusate (Senokot-S) tablet 1 tablet  1 tablet Oral QHS PRN Leim Fabry, MD      . sodium phosphate (FLEET) 7-19 GM/118ML enema 1 enema  1 enema Rectal Once PRN Leim Fabry, MD      . thiamine tablet 100 mg  100 mg Oral Daily Leim Fabry, MD   100 mg at 09/17/19 7858   Or  . thiamine (B-1) injection 100 mg  100 mg Intravenous Daily Leim Fabry, MD      . traMADol Veatrice Bourbon) tablet 50 mg  50 mg Oral Q6H PRN Leim Fabry, MD         Discharge Medications: Please see discharge summary for a list of discharge medications.  Relevant Imaging Results:  Relevant Lab Results:   Additional Information SS# [id number]  Su Hilt, RN

## 2019-09-17 NOTE — Progress Notes (Signed)
Physical Therapy Treatment Patient Details Name: Corey Gonzalez MRN: [id number] DOB: 1940-11-01 Today's Date: 09/17/2019    History of Present Illness Pt is a 79 y.o. male with medical history significant for diabetes mellitus, hypertension, renal insufficiency, alcohol dependence, bilateral TKR, and R THR. Per MD impression, pt presents with L hip intertrochanteric fx after a slip and fall and is now s/p L cephalomedullary nailing 7/31. MD impression also includes hypokalemia.    PT Comments    Pt pleasant and motivated to participate during the session. Pt reported pain 6/10 at beginning of session, stated only really hurts when he tries to move. Pt unsure if he had pain meds today and requested meds if not; nursing notified. Pt able to perform ankle pumps, quad sets, glute sets LLE with minimal difficulty. Pt required significant assist to perform LLE SLR with reduced ROM and increased pain. HR 101 and SpO2 95% following supine therex. Pt min A supine-sit with physical assist to help with LLE management; pt required significantly increased time and effort with cueing and encouragement needed. Pt HR 110bpm following supine-sit. Pt able to sit-stand with close CGA due to visibly increased pain and unsteady UE transition from bed to RW. Pt motivated to walk to bathroom and able to walk the 64ft w/ close CGA. Pt demonstrated minimal ability to bear weight through LLE during ambulation; evident with increased BUE support on RW and antalgic gait pattern. One episode of buckling was noted where pt nearly lost balance but able to self-correct. Pt able to sit on elevated BSC over toilet and notified to ring nurses once finished. Significant pain and increased effort noted for pt to ambulate this distance; HR inc to 113bpm and O2 dropped to 93%. Pt will benefit from PT services in a SNF setting upon discharge to safely address deficits listed in patient problem list for decreased caregiver assistance and eventual  return to PLOF.   Follow Up Recommendations  SNF     Equipment Recommendations  Rolling walker with 5" wheels    Recommendations for Other Services       Precautions / Restrictions Precautions Precautions: Fall Restrictions Weight Bearing Restrictions: Yes LLE Weight Bearing: Weight bearing as tolerated    Mobility  Bed Mobility Overal bed mobility: Needs Assistance Bed Mobility: Supine to Sit     Supine to sit: Min assist     General bed mobility comments: Min A for LLE mgmt supine-sit on L side of bed. Significant cueing and encouragement required; significantly inc time and effort for pt w/ use of handrails.  Transfers Overall transfer level: Needs assistance Equipment used: Rolling walker (2 wheeled) Transfers: Sit to/from Stand Sit to Stand: Min guard         General transfer comment: Close guard w/ sit-to-stand from bed x2 and to sit on elevated BSC over toiley. No physical assit required bur close guard secondary to pt appearing unsteady and in significant pain  Ambulation/Gait Ambulation/Gait assistance: Min guard Gait Distance (Feet): 15 Feet Assistive device: Rolling walker (2 wheeled) Gait Pattern/deviations: Step-to pattern;Decreased step length - right;Decreased stance time - left;Antalgic Gait velocity: decreased   General Gait Details: Pt able to walk to bathroom using step-to pattern with significant antalgia. Pt had a hard time placing weight through LLE and heavy use of BUE on RW. Had one bad step where leg seemed to buckle; pt able to regain balance. Close CGA provided throughout.   Stairs  Wheelchair Mobility    Modified Rankin (Stroke Patients Only)       Balance Overall balance assessment: Needs assistance Sitting-balance support: No upper extremity supported;Feet supported Sitting balance-Leahy Scale: Good     Standing balance support: Bilateral upper extremity supported Standing balance-Leahy Scale:  Fair Standing balance comment: Heavy lean with BUE on RW with significant difficulty shifting weight onto LLE. LLE appeared to buckle at times                            Cognition Arousal/Alertness: Awake/alert Behavior During Therapy: [facility] for tasks assessed/performed Overall Cognitive Status: Within Functional Limits for tasks assessed                                        Exercises Total Joint Exercises Ankle Circles/Pumps: AROM;Both;10 reps;15 reps Quad Sets: Strengthening;Both;10 reps Gluteal Sets: Strengthening;Both;10 reps Hip ABduction/ADduction: AROM;Strengthening;Left;5 reps Straight Leg Raises: AROM;Right;10 reps;AAROM;Strengthening;Left;15 reps Long Arc Quad: AROM;Strengthening;Left;10 Theatre manager in Standing: AROM;Strengthening;Both;5 reps;Standing    General Comments        Pertinent Vitals/Pain Pain Assessment: 0-10 Pain Score: 6  Pain Location: L hip Pain Descriptors / Indicators: Aching;Guarding;Grimacing Pain Intervention(s): Limited activity within patient's tolerance;Monitored during session;Patient requesting pain meds-RN notified    Home Living                      Prior Function            PT Goals (current goals can now be found in the care plan section) Progress towards PT goals: Progressing toward goals    Frequency    BID      PT Plan Current plan remains appropriate    Co-evaluation              AM-PAC PT "6 Clicks" Mobility   Outcome Measure  Help needed turning from your back to your side while in a flat bed without using bedrails?: A Little Help needed moving from lying on your back to sitting on the side of a flat bed without using bedrails?: A Little Help needed moving to and from a bed to a chair (including a wheelchair)?: A Lot Help needed standing up from a chair using your arms (e.g., wheelchair or bedside chair)?: A Little Help needed to walk in hospital room?: A Lot Help  needed climbing 3-5 steps with a railing? : Total 6 Click Score: 14    End of Session Equipment Utilized During Treatment: Gait belt Activity Tolerance: Patient limited by pain Patient left:  (on BSC; pt told to ring nurse when finished (nursing also notified)) Nurse Communication: Mobility status PT Visit Diagnosis: Unsteadiness on feet (R26.81);Muscle weakness (generalized) (M62.81);Difficulty in walking, not elsewhere classified (R26.2);Pain Pain - Right/Left: Left Pain - part of body: Hip     Time: 2446-2863 PT Time Calculation (min) (ACUTE ONLY): 28 min  Charges:                        Jaretzi Droz SPT 09/17/19, 1:30 PM

## 2019-09-17 NOTE — Progress Notes (Signed)
  Subjective: 2 Days Post-Op Procedure(s) (LRB): INTRAMEDULLARY (IM) NAIL INTERTROCHANTRIC (Left) Patient reports pain as mild.   Patient is well, and has had no acute complaints or problems Plan is to go Home after hospital stay. Negative for chest pain and shortness of breath Fever: no Gastrointestinal: Negative for nausea and vomiting  Objective: Vital signs in last 24 hours: Temp:  [98.3 F (36.8 C)-98.5 F (36.9 C)] 98.5 F (36.9 C) (08/02 0003) Pulse Rate:  [97-103] 98 (08/02 0003) Resp:  [18-20] 18 (08/02 0003) BP: (125-147)/(66-93) 128/73 (08/02 0003) SpO2:  [95 %-97 %] 95 % (08/02 0003)  Intake/Output from previous day:  Intake/Output Summary (Last 24 hours) at 09/17/2019 0557 Last data filed at 09/17/2019 0435 Gross per 24 hour  Intake 730.1 ml  Output 1375 ml  Net -644.9 ml    Intake/Output this shift: Total I/O In: -  Out: 775 [Urine:775]  Labs: Recent Labs    09/14/19 2002 09/15/19 0424 09/16/19 0531 09/17/19 0423  HGB 13.6 12.6* 11.0* 10.1*   Recent Labs    09/16/19 0531 09/17/19 0423  WBC 8.3 4.8  RBC 3.04* 2.81*  HCT 30.8* 28.3*  PLT 124* 107*   Recent Labs    09/16/19 0531 09/17/19 0423  NA 139 136  K 4.2 3.7  CL 101 99  CO2 30 31  BUN 13 19  CREATININE 1.31* 1.41*  GLUCOSE 150* 197*  CALCIUM 7.7* 7.8*   Recent Labs    09/14/19 2002  INR 1.0     EXAM General - Patient is Alert and Oriented Extremity - Neurovascular intact Sensation intact distally Dorsiflexion/Plantar flexion intact Compartment soft Dressing/Incision - clean, dry, no drainage Motor Function - intact, moving foot and toes well on exam.  Ambulated 20 feet with physical therapy  Past Medical History:  Diagnosis Date  . Diabetes mellitus without complication ([place])   . Hypertension     Assessment/Plan: 2 Days Post-Op Procedure(s) (LRB): INTRAMEDULLARY (IM) NAIL INTERTROCHANTRIC (Left) Principal Problem:   Closed left hip fracture, initial encounter  (HCC) Active Problems:   Diabetes mellitus without complication (HCC)   Chronic kidney disease, stage 3a   Hypertension   Alcohol dependence ([place])  Estimated body mass index is 25.09 kg/m as calculated from the following:   Height as of this encounter: 5\' 8"  (1.727 m).   Weight as of this encounter: 74.8 kg. Advance diet Up with therapy D/C IV fluids  Follow-up at [facility] clinic orthopedics in 2 weeks for staple removal and x-rays of the left femur  DVT Prophylaxis - Lovenox, Foot Pumps and TED hose Weight-Bearing as tolerated to left leg  Reche Dixon, PA-C Orthopaedic Surgery 09/17/2019, 5:57 AM

## 2019-09-17 NOTE — Progress Notes (Signed)
PROGRESS NOTE    Corey Gonzalez  JGG:[id number] DOB: 1940-10-31 DOA: 09/14/2019 PCP: System, Pcp Not In   Brief Narrative:Corey Gonzalez is a 79 y.o. male with medical history significant for diabetes mellitus, hypertension, renal insufficiency, and alcohol dependence, now presenting to the emergency department with severe left hip pain after fall at home.  Patient reports that he was in his usual state of health and was cleaning his dog when he slipped and fell onto his left side without hitting his head or losing consciousness.  He was experiencing immediate and severe pain at the left hip and a deformity was noted.  EMS was called and the patient was treated with 100 mcg of fentanyl prior to arrival in the ED.  He denies any recent illness, reports that he is fairly active at baseline and never experiences chest pain with exertion.  He has not been short of breath, coughing, or experiencing any subjective fevers or chills.  He reports drinking 5 to 6 ounces of liquor every night but denies any history of withdrawal.  He reports being prescribed antihypertensives and diabetes medications in the past but has elected to not take any medications. He reports chronic right leg swelling that is unchanged.   ED Course: Upon arrival to the ED, patient is found to be afebrile, saturating well on room air, and with stable blood pressure.  EKG features sinus tachycardia with rate 101 and first-degree AV nodal block.  Chest x-ray demonstrates mild cardiomegaly without acute findings.  Radiographs of the hip and femur demonstrate comminuted left intertrochanteric femur fracture.  Chemistry panel is notable for glucose 194, potassium 2.6, and creatinine 1.41.  CBC with mild thrombocytopenia and a macrocytosis without anemia.  COVID-19 screening test is negative.  Orthopedic surgery was consulted by the ED physician and recommends medical admission.  Patient was given a liter of IV fluids, fentanyl, acetaminophen, and oral  potassium in the ED.  Assessment & Plan:   Principal Problem:   Closed left hip fracture, initial encounter [facility]) Active Problems:   Diabetes mellitus without complication ([place])   Chronic kidney disease, stage 3a   Hypertension   Alcohol dependence ([place])   #1 status post fall and left hip fracture-status post IM nailing 09/15/2019.  Continue pain control and DVT prophylaxis per Ortho.  Patient is anxious to go home.  PT is recommending SNF.   #2 type 2 diabetes continue SSI.  Change diet to carb modified. CBG (last 3)  Recent Labs    09/17/19 0422 09/17/19 0818 09/17/19 1128  GLUCAP 180* 140* 205*     #3 hypokalemia-today potassium 4.2.    #4 history of essential hypertension-blood pressure 147/93.  Will restart home meds once medications have been reconciled.  #5 alcohol abuse he drinks 5 to 6 ounces of liquor every night.  Watch for withdrawals.  #6 history of CKD stage II creatinine 1.31 remained stable   Nutrition Problem: Increased nutrient needs Etiology: post-op healing, hip fracture     Signs/Symptoms: estimated needs    Interventions: Ensure Enlive (each supplement provides 350kcal and 20 grams of protein), MVI, Juven  Estimated body mass index is 25.09 kg/m as calculated from the following:   Height as of this encounter: 5\' 8"  (1.727 m).   Weight as of this encounter: 74.8 kg.  DVT prophylaxis: SCD Code Status: Full code Family Communication: None at bedside  disposition Plan:  Status is: Inpatient  Dispo: The patient is from: Home  Anticipated d/c is to: SNF              Anticipated d/c date is:1 day              Patient currently is medically stable to be discharged patient admitted with acute hip fracture  PT eval recommending SNF Consultants: Ortho  Procedures: None Antimicrobials: None  Subjective: Patient resting in bed he is awake and alert in no acute distress  Objective: Vitals:   09/16/19 0815 09/16/19 1551 09/17/19  0003 09/17/19 0754  BP: (!) 147/93 125/66 128/73 (!) 130/83  Pulse: 97 103 98 92  Resp: 18 20 18 17   Temp: 98.3 F (36.8 C) 98.4 F (36.9 C) 98.5 F (36.9 C) 98.2 F (36.8 C)  TempSrc: Oral Oral Oral Oral  SpO2: 97% 96% 95% 97%  Weight:      Height:        Intake/Output Summary (Last 24 hours) at 09/17/2019 1523 Last data filed at 09/17/2019 1409 Gross per 24 hour  Intake 240 ml  Output 1595 ml  Net -1355 ml   Filed Weights   09/14/19 1946  Weight: 74.8 kg    Examination:  General exam: Appears calm and comfortable  Respiratory system: Clear to auscultation. Respiratory effort normal. Cardiovascular system: S1 & S2 heard, RRR. No JVD, murmurs, rubs, gallops or clicks. No pedal edema. Gastrointestinal system: Abdomen is nondistended, soft and nontender. No organomegaly or masses felt. Normal bowel sounds heard. Central nervous system: Alert and oriented. No focal neurological deficits. Extremities: tender left hip Skin: No rashes, lesions or ulcers Psychiatry: Judgement and insight appear normal. Mood & affect appropriate.     Data Reviewed: I have personally reviewed following labs and imaging studies  CBC: Recent Labs  Lab 09/14/19 2002 09/15/19 0424 09/16/19 0531 09/17/19 0423  WBC 5.5 7.2 8.3 4.8  NEUTROABS 3.6  --   --   --   HGB 13.6 12.6* 11.0* 10.1*  HCT 39.4 36.4* 30.8* 28.3*  MCV 101.8* 102.0* 101.3* 100.7*  PLT 142* 130* 124* 235*   Basic Metabolic Panel: Recent Labs  Lab 09/14/19 2002 09/15/19 0424 09/15/19 1752 09/16/19 0531 09/17/19 0423  NA 139 141 142 139 136  K 2.6* 3.2* 4.0 4.2 3.7  CL 98 100 102 101 99  CO2 28 29 27 30 31   GLUCOSE 194* 161* 176* 150* 197*  BUN 10 8 10 13 19   CREATININE 1.41* 1.22 1.26* 1.31* 1.41*  CALCIUM 8.3* 8.2* 8.1* 7.7* 7.8*  MG  --  1.8  --   --   --    GFR: Estimated Creatinine Clearance: 41.8 mL/min (A) (by C-G formula based on SCr of 1.41 mg/dL (H)). Liver Function Tests: Recent Labs  Lab  09/14/19 2002 09/15/19 0424  AST 29 25  ALT 17 15  ALKPHOS 71 65  BILITOT 0.8 0.9  PROT 7.1 6.5  ALBUMIN 3.5 3.2*   No results for input(s): LIPASE, AMYLASE in the last 168 hours. No results for input(s): AMMONIA in the last 168 hours. Coagulation Profile: Recent Labs  Lab 09/14/19 2002  INR 1.0   Cardiac Enzymes: No results for input(s): CKTOTAL, CKMB, CKMBINDEX, TROPONINI in the last 168 hours. BNP (last 3 results) No results for input(s): PROBNP in the last 8760 hours. HbA1C: Recent Labs    09/15/19 0424  HGBA1C 7.5*   CBG: Recent Labs  Lab 09/16/19 2009 09/17/19 0002 09/17/19 0422 09/17/19 0818 09/17/19 1128  GLUCAP 138* 164* 180* 140* 205*  Lipid Profile: No results for input(s): CHOL, HDL, LDLCALC, TRIG, CHOLHDL, LDLDIRECT in the last 72 hours. Thyroid Function Tests: No results for input(s): TSH, T4TOTAL, FREET4, T3FREE, THYROIDAB in the last 72 hours. Anemia Panel: No results for input(s): VITAMINB12, FOLATE, FERRITIN, TIBC, IRON, RETICCTPCT in the last 72 hours. Sepsis Labs: No results for input(s): PROCALCITON, LATICACIDVEN in the last 168 hours.  Recent Results (from the past 240 hour(s))  SARS Coronavirus 2 by RT PCR (hospital order, performed in [facility] hospital lab) Nasopharyngeal Nasopharyngeal Swab     Status: None   Collection Time: 09/14/19  8:02 PM   Specimen: Nasopharyngeal Swab  Result Value Ref Range Status   SARS Coronavirus 2 NEGATIVE NEGATIVE Final    Comment: (NOTE) SARS-CoV-2 target nucleic acids are NOT DETECTED.  The SARS-CoV-2 RNA is generally detectable in upper and lower respiratory specimens during the acute phase of infection. The lowest concentration of SARS-CoV-2 viral copies this assay can detect is 250 copies / mL. A negative result does not preclude SARS-CoV-2 infection and should not be used as the sole basis for treatment or other patient management decisions.  A negative result may occur with improper  specimen collection / handling, submission of specimen other than nasopharyngeal swab, presence of viral mutation(s) within the areas targeted by this assay, and inadequate number of viral copies (<250 copies / mL). A negative result must be combined with clinical observations, patient history, and epidemiological information.  Fact Sheet for Patients:   StrictlyIdeas.no  Fact Sheet for Healthcare Providers: BankingDealers.co.za  This test is not yet approved or  cleared by the [place] FDA and has been authorized for detection and/or diagnosis of SARS-CoV-2 by FDA under an Emergency Use Authorization (EUA).  This EUA will remain in effect (meaning this test can be used) for the duration of the COVID-19 declaration under Section 564(b)(1) of the Act, 21 U.S.C. section 360bbb-3(b)(1), unless the authorization is terminated or revoked sooner.  Performed at [facility], [address], [place], [place] [address]   Surgical pcr screen     Status: None   Collection Time: 09/14/19 11:01 PM   Specimen: Nasal Mucosa; Nasal Swab  Result Value Ref Range Status   MRSA, PCR NEGATIVE NEGATIVE Final   Staphylococcus aureus NEGATIVE NEGATIVE Final    Comment: (NOTE) The Xpert SA Assay (FDA approved for NASAL specimens in patients 32 years of age and older), is one component of a comprehensive surveillance program. It is not intended to diagnose infection nor to guide or monitor treatment. Performed at [facility], [address], [place], [place] [address]          Radiology Studies: DG HIP OPERATIVE [facility] OR W/O PELVIS LEFT  Result Date: 09/15/2019 CLINICAL DATA:  Left hip IM nail. EXAM: OPERATIVE LEFT HIP (WITH PELVIS IF PERFORMED) TECHNIQUE: Fluoroscopic spot image(s) were submitted for interpretation post-operatively. COMPARISON:  Radiographs yesterday FINDINGS: Six fluoroscopic spot views of the left  proximal femur obtained in the operating room. Intertrochanteric femur fracture with subsequent intramedullary nail, trans trochanteric and distal locking screw fixation. Total fluoroscopy time 1 minutes 47 seconds. IMPRESSION: Procedural fluoroscopy during left proximal femur fracture ORIF. Electronically Signed   By: Keith Rake M.D.   On: 09/15/2019 16:43   DG HIP UNILAT WITH PELVIS 2-3 VIEWS LEFT  Result Date: 09/15/2019 CLINICAL DATA:  Left hip IM nail. EXAM: DG HIP (WITH OR WITHOUT PELVIS) 2-3V LEFT COMPARISON:  Preoperative radiograph yesterday. FINDINGS: Intramedullary nail with trans  trochanteric and distal locking screw fixation traverse intertrochanteric femur fracture. The fracture is in improved alignment compared to preoperative imaging. There is residual displacement involving the lesser trochanter. Recent postsurgical change includes air and edema in the soft tissues with lateral skin staples. Right hip arthroplasty is intact were visualized. There are advanced vascular calcifications. IMPRESSION: ORIF intertrochanteric left femur fracture without immediate postoperative complication. Electronically Signed   By: Keith Rake M.D.   On: 09/15/2019 18:07        Scheduled Meds: . Chlorhexidine Gluconate Cloth  6 each Topical Daily  . docusate sodium  100 mg Oral BID  . enoxaparin (LOVENOX) injection  40 mg Subcutaneous Q24H  . feeding supplement (ENSURE ENLIVE)  237 mL Oral BID BM  . folic acid  1 mg Oral Daily  . insulin aspart  0-9 Units Subcutaneous Q4H  . multivitamin with minerals  1 tablet Oral Daily  . nutrition supplement (JUVEN)  1 packet Oral BID BM  . thiamine  100 mg Oral Daily   Or  . thiamine  100 mg Intravenous Daily   Continuous Infusions: . methocarbamol (ROBAXIN) IV       LOS: 3 days     Georgette Shell, MD  09/17/2019, 3:23 PM

## 2019-09-17 NOTE — Progress Notes (Signed)
Physical Therapy Treatment Patient Details Name: Corey Gonzalez MRN: [id number] DOB: 11/20/1940 Today's Date: 09/17/2019    History of Present Illness Pt is a 79 y.o. male with medical history significant for diabetes mellitus, hypertension, renal insufficiency, alcohol dependence, bilateral TKR, and R THR. Per MD impression, pt presents with L hip intertrochanteric fx after a slip and fall and is now s/p L cephalomedullary nailing 7/31. MD impression also includes hypokalemia.    PT Comments    Pt pleasant and motivated to participate during the session. Pt reported 7/10 pain at beginning of session and unsure if recently given pain meds; RN notified and pt given pain meds during session. Pt able to perform supine exercises w/ mod assist from therapist for LLE SLR, heel slides, and hip abduction. Pt continued to require min assist for LLE management with supine-sit; otherwise required significantly increased time and effort w/ use of handrails and min-cueing/encouragement for sequencing.  HR 112bpm with transition to sitting. Pt independently performed LAQ; notably had increased pain and decreased ROM. Pt sit-to-stand with mod cueing for sequencing and close CGA 2/2 pt pain and unsteadiness. Pt able to ambulate 56ft to recliner with antalgic, step-to gait using RW. A few buckling or near-buckling episodes were observed during ambulation; close CGA maintained, however pt able to self-correct balance. Pt will benefit from PT services in a SNF setting upon discharge to safely address deficits listed in patient problem list for decreased caregiver assistance and eventual return to PLOF.   Follow Up Recommendations  SNF     Equipment Recommendations  Rolling walker with 5" wheels    Recommendations for Other Services       Precautions / Restrictions Precautions Precautions: Fall Restrictions Weight Bearing Restrictions: Yes LLE Weight Bearing: Weight bearing as tolerated    Mobility  Bed  Mobility Overal bed mobility: Needs Assistance Bed Mobility: Supine to Sit     Supine to sit: Min assist     General bed mobility comments: Min A for LLE mgmt supine-sit on L side of bed. Significant cueing and encouragement required; significantly inc time and effort for pt w/ use of handrails.  Transfers Overall transfer level: Needs assistance Equipment used: Rolling walker (2 wheeled) Transfers: Sit to/from Stand Sit to Stand: Min guard         General transfer comment: Close guard 2/2 pt appearing unsteady and in significant pain.  Ambulation/Gait Ambulation/Gait assistance: Min guard Gait Distance (Feet): 15 Feet Assistive device: Rolling walker (2 wheeled) Gait Pattern/deviations: Step-to pattern;Decreased step length - right;Decreased stance time - left;Antalgic Gait velocity: decreased   General Gait Details: Pt able to walk to recliner using step-to pattern with significant antalgia. Pt had a hard time placing weight through LLE and heavy use of BUE on RW. Had a few bad steps where leg seemed to buckle; pt able to regain balance. Close CGA provided throughout 2/2 unsteadiness and occasional buckling   Stairs             Wheelchair Mobility    Modified Rankin (Stroke Patients Only)       Balance Overall balance assessment: Needs assistance Sitting-balance support: No upper extremity supported;Feet supported Sitting balance-Leahy Scale: Good     Standing balance support: Bilateral upper extremity supported;During functional activity Standing balance-Leahy Scale: Fair Standing balance comment: Heavy lean with BUE on RW with significant difficulty shifting weight onto LLE. LLE appeared to buckle at times  Cognition Arousal/Alertness: Awake/alert Behavior During Therapy: WFL for tasks assessed/performed Overall Cognitive Status: Within Functional Limits for tasks assessed                                         Exercises Total Joint Exercises Ankle Circles/Pumps: AROM;Both;10 reps;15 reps Quad Sets: Strengthening;Both;10 reps Gluteal Sets: Strengthening;Both;10 reps Heel Slides: AAROM;Strengthening;Left;5 reps Hip ABduction/ADduction: AROM;Strengthening;Left;5 reps Straight Leg Raises: AROM;Right;10 reps;AAROM;Strengthening;Left;15 reps Long Arc Quad: AROM;Strengthening;Left;10 reps;15 reps Other Exercises Other Exercises: Education on sitting/supine exercises HEP w/ instruction on frequency    General Comments        Pertinent Vitals/Pain Pain Assessment: 0-10 Pain Score: 7  Pain Location: L hip Pain Descriptors / Indicators: Aching;Guarding;Grimacing Pain Intervention(s): Limited activity within patient's tolerance;Monitored during session;Repositioned;Patient requesting pain meds-RN notified;RN gave pain meds during session    Home Living                      Prior Function            PT Goals (current goals can now be found in the care plan section) Progress towards PT goals: Progressing toward goals    Frequency    BID      PT Plan Current plan remains appropriate    Co-evaluation              AM-PAC PT "6 Clicks" Mobility   Outcome Measure  Help needed turning from your back to your side while in a flat bed without using bedrails?: A Little Help needed moving from lying on your back to sitting on the side of a flat bed without using bedrails?: A Little Help needed moving to and from a bed to a chair (including a wheelchair)?: A Lot Help needed standing up from a chair using your arms (e.g., wheelchair or bedside chair)?: A Little Help needed to walk in hospital room?: A Lot Help needed climbing 3-5 steps with a railing? : Total 6 Click Score: 14    End of Session Equipment Utilized During Treatment: Gait belt Activity Tolerance: Patient limited by pain Patient left: in chair;with call bell/phone within reach;with chair alarm  set;with SCD's reapplied Nurse Communication: Mobility status PT Visit Diagnosis: Unsteadiness on feet (R26.81);Muscle weakness (generalized) (M62.81);Difficulty in walking, not elsewhere classified (R26.2);Pain Pain - Right/Left: Left Pain - part of body: Hip     Time: 2831-5176 PT Time Calculation (min) (ACUTE ONLY): 28 min  Charges:                        Brayln Duque SPT 09/17/19, 4:01 PM

## 2019-09-18 LAB — BASIC METABOLIC PANEL
Anion gap: 6 (ref 5–15)
BUN: 14 mg/dL (ref 8–23)
CO2: 33 mmol/L — ABNORMAL HIGH (ref 22–32)
Calcium: 8 mg/dL — ABNORMAL LOW (ref 8.9–10.3)
Chloride: 97 mmol/L — ABNORMAL LOW (ref 98–111)
Creatinine, Ser: 1.17 mg/dL (ref 0.61–1.24)
GFR calc Af Amer: >60 mL/min (ref >60)
GFR calc non Af Amer: 59 mL/min — ABNORMAL LOW (ref >60)
Glucose, Bld: 177 mg/dL — ABNORMAL HIGH (ref 70–99)
Potassium: 3.4 mmol/L — ABNORMAL LOW (ref 3.5–5.1)
Sodium: 136 mmol/L (ref 135–145)

## 2019-09-18 LAB — CBC
HCT: 27.7 % — ABNORMAL LOW (ref 39.0–52.0)
Hemoglobin: 9.7 g/dL — ABNORMAL LOW (ref 13.0–17.0)
MCH: 35.4 pg — ABNORMAL HIGH (ref 26.0–34.0)
MCHC: 35 g/dL (ref 30.0–36.0)
MCV: 101.1 fL — ABNORMAL HIGH (ref 80.0–100.0)
Platelets: 121 10*3/uL — ABNORMAL LOW (ref 150–400)
RBC: 2.74 MIL/uL — ABNORMAL LOW (ref 4.22–5.81)
RDW: 14.2 % (ref 11.5–15.5)
WBC: 5.2 10*3/uL (ref 4.0–10.5)
nRBC: 0 % (ref 0.0–0.2)

## 2019-09-18 LAB — GLUCOSE, CAPILLARY
Glucose-Capillary: 148 mg/dL — ABNORMAL HIGH (ref 70–99)
Glucose-Capillary: 148 mg/dL — ABNORMAL HIGH (ref 70–99)
Glucose-Capillary: 232 mg/dL — ABNORMAL HIGH (ref 70–99)
Glucose-Capillary: 214 mg/dL — ABNORMAL HIGH (ref 70–99)

## 2019-09-18 MED ORDER — POTASSIUM CHLORIDE CRYS ER 20 MEQ PO TBCR
40.0000 meq | EXTENDED_RELEASE_TABLET | Freq: Once | ORAL | Status: AC
  Administered 2019-09-18: 40 meq via ORAL
  Filled 2019-09-18: qty 2

## 2019-09-18 MED ORDER — BISACODYL 10 MG RE SUPP: 10.0000 mg | Freq: Every day | RECTAL | 0 refills | Status: DC | PRN

## 2019-09-18 MED ORDER — AMLODIPINE BESYLATE 5 MG PO TABS
5.0000 mg | ORAL_TABLET | Freq: Every day | ORAL | 11 refills | Status: DC
Start: 2019-09-18 — End: 2021-01-05

## 2019-09-18 MED ORDER — AMLODIPINE BESYLATE 5 MG PO TABS: 5.0000 mg | ORAL_TABLET | Freq: Every day | ORAL | Status: DC

## 2019-09-18 MED ORDER — METFORMIN HCL 500 MG PO TABS: 500.0000 mg | ORAL_TABLET | Freq: Every day | ORAL | 11 refills | Status: DC

## 2019-09-18 MED ORDER — DOCUSATE SODIUM 100 MG PO CAPS: 100.0000 mg | ORAL_CAPSULE | Freq: Two times a day (BID) | ORAL | 0 refills | Status: DC

## 2019-09-18 MED ORDER — SENNOSIDES-DOCUSATE SODIUM 8.6-50 MG PO TABS: 1.0000 | ORAL_TABLET | Freq: Every evening | ORAL | Status: DC | PRN

## 2019-09-18 MED ORDER — AMLODIPINE BESYLATE 5 MG PO TABS: 5.0000 mg | ORAL_TABLET | Freq: Every day | ORAL | 4 refills | Status: DC

## 2019-09-18 NOTE — Discharge Summary (Addendum)
Physician Discharge Summary  Corey Gonzalez MAU:[id number] DOB: 29-Dec-1940 DOA: 09/14/2019  PCP: System, Pcp Not In  Admit date: 09/14/2019 Discharge date: 09/18/2019  Admitted From: Home Disposition: Skilled nursing facility Recommendations for Outpatient Follow-up:  1. Follow up with PCP in 1-2 weeks 2. Please obtain BMP/CBC in one week 3. Please follow up with orthopedics  Home Health: None  equipment/Devices: None Discharge Condition: Stable and improved CODE STATUS full code Diet recommendation: Cardiac diet Brief/Interim Summary:Corey Gonzalez a 79 y.o.malewith medical history significant fordiabetes mellitus, hypertension, renal insufficiency, and alcohol dependence, now presenting to the emergency department with severe left hip pain after fall at home. Patient reports that he was in his usual state of health and was cleaning his dog when he slipped and fell onto his left side without hitting his head or losing consciousness. He was experiencing immediate and severe pain at the left hip and a deformity was noted. EMS was called and the patient was treated with 100 mcg of fentanyl prior to arrival in the ED. He denies any recent illness, reports that he is fairly active at baseline and never experiences chest pain with exertion. He has not been short of breath, coughing, or experiencing any subjective fevers or chills. He reports drinking 5 to 6 ounces of liquor every night but denies any history of withdrawal. He reports being prescribed antihypertensives and diabetes medications in the past but has elected to not take any medications. He reports chronic right leg swelling that is unchanged.  ED Course:Upon arrival to the ED, patient is found to be afebrile, saturating well on room air, and with stable blood pressure. EKG features sinus tachycardia with rate 101 and first-degree AV nodal block. Chest x-ray demonstrates mild cardiomegaly without acute findings. Radiographs of the  hip and femur demonstrate comminuted left intertrochanteric femur fracture. Chemistry panel is notable for glucose 194, potassium 2.6, and creatinine 1.41. CBC with mild thrombocytopenia and a macrocytosis without anemia. COVID-19 screening test is negative. Orthopedic surgery was consulted by the ED physician and recommends medical admission. Patient was given a liter of IV fluids, fentanyl, acetaminophen, and oral potassium in the ED.  Discharge Diagnoses:  Principal Problem:   Closed left hip fracture, initial encounter [facility]) Active Problems:   Diabetes mellitus without complication ([place])   Chronic kidney disease, stage 3a   Hypertension   Alcohol dependence ([place])  #1 status post fall and left hip fracture-status post IM nailing 09/15/2019.  Continue pain control and DVT prophylaxis with Lovenox.  Patient being discharged to skilled nursing facility today.  Follow-up with Ortho.    #2 type 2 diabetes -we will start him on Metformin 500 mg once  a day. CBG (last 3)  Recent Labs    09/17/19 2134 09/18/19 0733 09/18/19 1134  GLUCAP 217* 148*  148* 232*    #3 hypokalemia-potassium 3.4 repleted.  Please check CBC and BMP with magnesium as needed at the facility.  #4 history of essential hypertension-continue Norvasc.  #5 alcohol abuse he drinks 5 to 6 ounces of liquor every night.  Watch for withdrawals.  #6 history of CKD stage II creatinine 1.31 remained stable     Nutrition Problem: Increased nutrient needs Etiology: post-op healing, hip fracture    Signs/Symptoms: estimated needs     Interventions: Ensure Enlive (each supplement provides 350kcal and 20 grams of protein), MVI, Juven  Estimated body mass index is 25.09 kg/m as calculated from the following:   Height as of this encounter: 5\' 8"  (  1.727 m).   Weight as of this encounter: 74.8 kg.  Discharge Instructions  Discharge Instructions    Diet - low sodium heart healthy   Complete by: As directed     Increase activity slowly   Complete by: As directed    Leave dressing on - Keep it clean, dry, and intact until clinic visit   Complete by: As directed      Allergies as of 09/18/2019   No Known Allergies     Medication List    TAKE these medications   bisacodyl 10 MG suppository Commonly known as: DULCOLAX Place 1 suppository (10 mg total) rectally daily as needed for moderate constipation.   docusate sodium 100 MG capsule Commonly known as: COLACE Take 1 capsule (100 mg total) by mouth 2 (two) times daily.   enoxaparin 40 MG/0.4ML injection Commonly known as: LOVENOX Inject 0.4 mLs (40 mg total) into the skin daily for 14 doses.   ondansetron 4 MG tablet Commonly known as: ZOFRAN Take 1 tablet (4 mg total) by mouth every 6 (six) hours as needed for nausea.   oxyCODONE 5 MG immediate release tablet Commonly known as: Oxy IR/ROXICODONE Take 1 tablet (5 mg total) by mouth every 4 (four) hours as needed for severe pain (pain score 7-10).   senna-docusate 8.6-50 MG tablet Commonly known as: Senokot-S Take 1 tablet by mouth at bedtime as needed for mild constipation.   traMADol 50 MG tablet Commonly known as: ULTRAM Take 1 tablet (50 mg total) by mouth every 6 (six) hours as needed for moderate pain.            Discharge Care Instructions  (From admission, onward)         Start     Ordered   09/18/19 0000  Leave dressing on - Keep it clean, dry, and intact until clinic visit        09/18/19 1132          Follow-up Information    Reche Dixon, PA-C Follow up in 2 week(s).   Specialty: Orthopedic Surgery Why: For femoral x-rays and staple removal Contact information: [place] [address] [phone]              No Known Allergies  Consultations:  Ortho   Procedures/Studies: DG Chest Portable 1 View  Result Date: 09/14/2019 CLINICAL DATA:  79 year old male status post fall at home. Left femur  fracture. EXAM: PORTABLE CHEST 1 VIEW COMPARISON:  None. FINDINGS: Portable AP semi upright view at 2011 hours. Lordotic view. Mild cardiomegaly. Mildly tortuous thoracic aorta. Visualized tracheal air column is within normal limits. Allowing for portable technique the lungs are clear. Negative visible bowel gas pattern. No acute osseous abnormality identified. IMPRESSION: Mild cardiomegaly.  No acute cardiopulmonary abnormality. Electronically Signed   By: Genevie Ann M.D.   On: 09/14/2019 20:28   DG HIP OPERATIVE UNILAT W OR W/O PELVIS LEFT  Result Date: 09/15/2019 CLINICAL DATA:  Left hip IM nail. EXAM: OPERATIVE LEFT HIP (WITH PELVIS IF PERFORMED) TECHNIQUE: Fluoroscopic spot image(s) were submitted for interpretation post-operatively. COMPARISON:  Radiographs yesterday FINDINGS: Six fluoroscopic spot views of the left proximal femur obtained in the operating room. Intertrochanteric femur fracture with subsequent intramedullary nail, trans trochanteric and distal locking screw fixation. Total fluoroscopy time 1 minutes 47 seconds. IMPRESSION: Procedural fluoroscopy during left proximal femur fracture ORIF. Electronically Signed   By: Keith Rake M.D.   On: 09/15/2019 16:43   DG  HIP UNILAT WITH PELVIS 2-3 VIEWS LEFT  Result Date: 09/15/2019 CLINICAL DATA:  Left hip IM nail. EXAM: DG HIP (WITH OR WITHOUT PELVIS) 2-3V LEFT COMPARISON:  Preoperative radiograph yesterday. FINDINGS: Intramedullary nail with trans trochanteric and distal locking screw fixation traverse intertrochanteric femur fracture. The fracture is in improved alignment compared to preoperative imaging. There is residual displacement involving the lesser trochanter. Recent postsurgical change includes air and edema in the soft tissues with lateral skin staples. Right hip arthroplasty is intact were visualized. There are advanced vascular calcifications. IMPRESSION: ORIF intertrochanteric left femur fracture without immediate postoperative  complication. Electronically Signed   By: Keith Rake M.D.   On: 09/15/2019 18:07   DG Hip Unilat W or Wo Pelvis 2-3 Views Left  Result Date: 09/14/2019 CLINICAL DATA:  79 year old male status post fall at home. EXAM: DG HIP (WITH OR WITHOUT PELVIS) 2-3V LEFT; LEFT FEMUR 2 VIEWS COMPARISON:  None. FINDINGS: Left hip: Comminuted left femur intertrochanteric fracture with mild varus impaction. Left femoral head remains normally located. Superimposed right total hip arthroplasty. No pelvis fracture identified. Grossly intact visible proximal femur. Superimposed iliofemoral calcified atherosclerosis. Negative visible lower abdominal and pelvic visceral contours. Left femur: Distal to the intertrochanteric segment the left femur appears intact. Superimposed left total knee arthroplasty appears aligned. Calcified peripheral vascular disease in the left lower extremity. IMPRESSION: 1. Comminuted left femur intertrochanteric fracture with mild varus impaction. 2. No other acute fracture or dislocation identified about the left femur or pelvis. 3. Right total hip arthroplasty, left knee arthroplasty. 4. Calcified atherosclerosis, peripheral vascular disease. Electronically Signed   By: Genevie Ann M.D.   On: 09/14/2019 20:27   DG Femur Min 2 Views Left  Result Date: 09/14/2019 CLINICAL DATA:  79 year old male status post fall at home. EXAM: DG HIP (WITH OR WITHOUT PELVIS) 2-3V LEFT; LEFT FEMUR 2 VIEWS COMPARISON:  None. FINDINGS: Left hip: Comminuted left femur intertrochanteric fracture with mild varus impaction. Left femoral head remains normally located. Superimposed right total hip arthroplasty. No pelvis fracture identified. Grossly intact visible proximal femur. Superimposed iliofemoral calcified atherosclerosis. Negative visible lower abdominal and pelvic visceral contours. Left femur: Distal to the intertrochanteric segment the left femur appears intact. Superimposed left total knee arthroplasty appears  aligned. Calcified peripheral vascular disease in the left lower extremity. IMPRESSION: 1. Comminuted left femur intertrochanteric fracture with mild varus impaction. 2. No other acute fracture or dislocation identified about the left femur or pelvis. 3. Right total hip arthroplasty, left knee arthroplasty. 4. Calcified atherosclerosis, peripheral vascular disease. Electronically Signed   By: Genevie Ann M.D.   On: 09/14/2019 20:27    (Echo, Carotid, EGD, Colonoscopy, ERCP)    Subjective: Patient is resting in bed he was up this morning to move his bowels anxious to go to rehab and then go home  Discharge Exam: Vitals:   09/17/19 2137 09/18/19 0017  BP: (!) 147/71 (!) 148/75  Pulse: (!) 110 (!) 102  Resp: 16 17  Temp: 98.8 F (37.1 C) 98.1 F (36.7 C)  SpO2: 95% 97%   Vitals:   09/17/19 0754 09/17/19 1528 09/17/19 2137 09/18/19 0017  BP: (!) 130/83 (!) 141/85 (!) 147/71 (!) 148/75  Pulse: 92 (!) 109 (!) 110 (!) 102  Resp: 17 15 16 17   Temp: 98.2 F (36.8 C) 98.9 F (37.2 C) 98.8 F (37.1 C) 98.1 F (36.7 C)  TempSrc: Oral Oral Oral   SpO2: 97% 98% 95% 97%  Weight:      Height:  General: Pt is alert, awake, not in acute distress Cardiovascular: RRR, S1/S2 +, no rubs, no gallops Respiratory: CTA bilaterally, no wheezing, no rhonchi Abdominal: Soft, NT, ND, bowel sounds + Extremities: Trace left lower extremity edema   The results of significant diagnostics from this hospitalization (including imaging, microbiology, ancillary and laboratory) are listed below for reference.     Microbiology: Recent Results (from the past 240 hour(s))  SARS Coronavirus 2 by RT PCR (hospital order, performed in [facility] hospital lab) Nasopharyngeal Nasopharyngeal Swab     Status: None   Collection Time: 09/14/19  8:02 PM   Specimen: Nasopharyngeal Swab  Result Value Ref Range Status   SARS Coronavirus 2 NEGATIVE NEGATIVE Final    Comment: (NOTE) SARS-CoV-2 target nucleic acids are  NOT DETECTED.  The SARS-CoV-2 RNA is generally detectable in upper and lower respiratory specimens during the acute phase of infection. The lowest concentration of SARS-CoV-2 viral copies this assay can detect is 250 copies / mL. A negative result does not preclude SARS-CoV-2 infection and should not be used as the sole basis for treatment or other patient management decisions.  A negative result may occur with improper specimen collection / handling, submission of specimen other than nasopharyngeal swab, presence of viral mutation(s) within the areas targeted by this assay, and inadequate number of viral copies (<250 copies / mL). A negative result must be combined with clinical observations, patient history, and epidemiological information.  Fact Sheet for Patients:   StrictlyIdeas.no  Fact Sheet for Healthcare Providers: BankingDealers.co.za  This test is not yet approved or  cleared by the [place] FDA and has been authorized for detection and/or diagnosis of SARS-CoV-2 by FDA under an Emergency Use Authorization (EUA).  This EUA will remain in effect (meaning this test can be used) for the duration of the COVID-19 declaration under Section 564(b)(1) of the Act, 21 U.S.C. section 360bbb-3(b)(1), unless the authorization is terminated or revoked sooner.  Performed at [facility], [address] [place],  [address]   Surgical pcr screen     Status: None   Collection Time: 09/14/19 11:01 PM   Specimen: Nasal Mucosa; Nasal Swab  Result Value Ref Range Status   MRSA, PCR NEGATIVE NEGATIVE Final   Staphylococcus aureus NEGATIVE NEGATIVE Final    Comment: (NOTE) The Xpert SA Assay (FDA approved for NASAL specimens in patients 72 years of age and older), is one component of a comprehensive surveillance program. It is not intended to diagnose infection nor to guide or monitor treatment. Performed at [facility], [address] [place],  [address]   SARS Coronavirus 2 by RT PCR (hospital order, performed in [facility] hospital lab) Nasopharyngeal Nasopharyngeal Swab     Status: None   Collection Time: 09/17/19  4:32 PM   Specimen: Nasopharyngeal Swab  Result Value Ref Range Status   SARS Coronavirus 2 NEGATIVE NEGATIVE Final    Comment: (NOTE) SARS-CoV-2 target nucleic acids are NOT DETECTED.  The SARS-CoV-2 RNA is generally detectable in upper and lower respiratory specimens during the acute phase of infection. The lowest concentration of SARS-CoV-2 viral copies this assay can detect is 250 copies / mL. A negative result does not preclude SARS-CoV-2 infection and should not be used as the sole basis for treatment or other patient management decisions.  A negative result may occur with improper specimen collection / handling, submission of specimen other than nasopharyngeal swab, presence of viral mutation(s) within the areas targeted by this assay,  and inadequate number of viral copies (<250 copies / mL). A negative result must be combined with clinical observations, patient history, and epidemiological information.  Fact Sheet for Patients:   StrictlyIdeas.no  Fact Sheet for Healthcare Providers: BankingDealers.co.za  This test is not yet approved or  cleared by the [place] FDA and has been authorized for detection and/or diagnosis of SARS-CoV-2 by FDA under an Emergency Use Authorization (EUA).  This EUA will remain in effect (meaning this test can be used) for the duration of the COVID-19 declaration under Section 564(b)(1) of the Act, 21 U.S.C. section 360bbb-3(b)(1), unless the authorization is terminated or revoked sooner.  Performed at [facility], [address] [place], [place] [address]      Labs: BNP (last 3 results) No results for input(s): BNP in the last 8760 hours. Basic  Metabolic Panel: Recent Labs  Lab 09/15/19 0424 09/15/19 1752 09/16/19 0531 09/17/19 0423 09/18/19 0451  NA 141 142 139 136 136  K 3.2* 4.0 4.2 3.7 3.4*  CL 100 102 101 99 97*  CO2 29 27 30 31  33*  GLUCOSE 161* 176* 150* 197* 177*  BUN 8 10 13 19 14   CREATININE 1.22 1.26* 1.31* 1.41* 1.17  CALCIUM 8.2* 8.1* 7.7* 7.8* 8.0*  MG 1.8  --   --   --   --    Liver Function Tests: Recent Labs  Lab 09/14/19 2002 09/15/19 0424  AST 29 25  ALT 17 15  ALKPHOS 71 65  BILITOT 0.8 0.9  PROT 7.1 6.5  ALBUMIN 3.5 3.2*   No results for input(s): LIPASE, AMYLASE in the last 168 hours. No results for input(s): AMMONIA in the last 168 hours. CBC: Recent Labs  Lab 09/14/19 2002 09/15/19 0424 09/16/19 0531 09/17/19 0423 09/18/19 0451  WBC 5.5 7.2 8.3 4.8 5.2  NEUTROABS 3.6  --   --   --   --   HGB 13.6 12.6* 11.0* 10.1* 9.7*  HCT 39.4 36.4* 30.8* 28.3* 27.7*  MCV 101.8* 102.0* 101.3* 100.7* 101.1*  PLT 142* 130* 124* 107* 121*   Cardiac Enzymes: No results for input(s): CKTOTAL, CKMB, CKMBINDEX, TROPONINI in the last 168 hours. BNP: Invalid input(s): POCBNP CBG: Recent Labs  Lab 09/17/19 0818 09/17/19 1128 09/17/19 1631 09/17/19 2134 09/18/19 0733  GLUCAP 140* 205* 188* 217* 148*  148*   D-Dimer No results for input(s): DDIMER in the last 72 hours. Hgb A1c No results for input(s): HGBA1C in the last 72 hours. Lipid Profile No results for input(s): CHOL, HDL, LDLCALC, TRIG, CHOLHDL, LDLDIRECT in the last 72 hours. Thyroid function studies No results for input(s): TSH, T4TOTAL, T3FREE, THYROIDAB in the last 72 hours.  Invalid input(s): FREET3 Anemia work up No results for input(s): VITAMINB12, FOLATE, FERRITIN, TIBC, IRON, RETICCTPCT in the last 72 hours. Urinalysis No results found for: COLORURINE, APPEARANCEUR, [place], [place], [place], [place], [place], [place], PROTEINUR, UROBILINOGEN, NITRITE, LEUKOCYTESUR Sepsis Labs Invalid input(s): PROCALCITONIN,   WBC,  LACTICIDVEN Microbiology Recent Results (from the past 240 hour(s))  SARS Coronavirus 2 by RT PCR (hospital order, performed in [facility] hospital lab) Nasopharyngeal Nasopharyngeal Swab     Status: None   Collection Time: 09/14/19  8:02 PM   Specimen: Nasopharyngeal Swab  Result Value Ref Range Status   SARS Coronavirus 2 NEGATIVE NEGATIVE Final    Comment: (NOTE) SARS-CoV-2 target nucleic acids are NOT DETECTED.  The SARS-CoV-2 RNA is generally detectable in upper and lower respiratory specimens during the acute phase of infection. The  lowest concentration of SARS-CoV-2 viral copies this assay can detect is 250 copies / mL. A negative result does not preclude SARS-CoV-2 infection and should not be used as the sole basis for treatment or other patient management decisions.  A negative result may occur with improper specimen collection / handling, submission of specimen other than nasopharyngeal swab, presence of viral mutation(s) within the areas targeted by this assay, and inadequate number of viral copies (<250 copies / mL). A negative result must be combined with clinical observations, patient history, and epidemiological information.  Fact Sheet for Patients:   StrictlyIdeas.no  Fact Sheet for Healthcare Providers: BankingDealers.co.za  This test is not yet approved or  cleared by the [place] FDA and has been authorized for detection and/or diagnosis of SARS-CoV-2 by FDA under an Emergency Use Authorization (EUA).  This EUA will remain in effect (meaning this test can be used) for the duration of the COVID-19 declaration under Section 564(b)(1) of the Act, 21 U.S.C. section 360bbb-3(b)(1), unless the authorization is terminated or revoked sooner.  Performed at [facility], [address], [place], [place] [address]   Surgical pcr screen     Status: None   Collection Time: 09/14/19 11:01 PM    Specimen: Nasal Mucosa; Nasal Swab  Result Value Ref Range Status   MRSA, PCR NEGATIVE NEGATIVE Final   Staphylococcus aureus NEGATIVE NEGATIVE Final    Comment: (NOTE) The Xpert SA Assay (FDA approved for NASAL specimens in patients 72 years of age and older), is one component of a comprehensive surveillance program. It is not intended to diagnose infection nor to guide or monitor treatment. Performed at [facility], [address] [place], [place] [address]   SARS Coronavirus 2 by RT PCR (hospital order, performed in [facility] hospital lab) Nasopharyngeal Nasopharyngeal Swab     Status: None   Collection Time: 09/17/19  4:32 PM   Specimen: Nasopharyngeal Swab  Result Value Ref Range Status   SARS Coronavirus 2 NEGATIVE NEGATIVE Final    Comment: (NOTE) SARS-CoV-2 target nucleic acids are NOT DETECTED.  The SARS-CoV-2 RNA is generally detectable in upper and lower respiratory specimens during the acute phase of infection. The lowest concentration of SARS-CoV-2 viral copies this assay can detect is 250 copies / mL. A negative result does not preclude SARS-CoV-2 infection and should not be used as the sole basis for treatment or other patient management decisions.  A negative result may occur with improper specimen collection / handling, submission of specimen other than nasopharyngeal swab, presence of viral mutation(s) within the areas targeted by this assay, and inadequate number of viral copies (<250 copies / mL). A negative result must be combined with clinical observations, patient history, and epidemiological information.  Fact Sheet for Patients:   StrictlyIdeas.no  Fact Sheet for Healthcare Providers: BankingDealers.co.za  This test is not yet approved or  cleared by the [place] FDA and has been authorized for detection and/or diagnosis of SARS-CoV-2 by FDA under an Emergency Use Authorization (EUA).   This EUA will remain in effect (meaning this test can be used) for the duration of the COVID-19 declaration under Section 564(b)(1) of the Act, 21 U.S.C. section 360bbb-3(b)(1), unless the authorization is terminated or revoked sooner.  Performed at [facility], [address] [place], [place] [address]      Time coordinating discharge: 39 minutes  SIGNED:   Georgette Shell, MD  [facility] 09/18/2019, 11:34 AM

## 2019-09-18 NOTE — Progress Notes (Signed)
Physical Therapy Treatment Patient Details Name: Corey Gonzalez MRN: [id number] DOB: 02/08/1941 Today's Date: 09/18/2019    History of Present Illness Pt is a 79 y.o. male with medical history significant for diabetes mellitus, hypertension, renal insufficiency, alcohol dependence, bilateral TKR, and R THR. Per MD impression, pt presents with L hip intertrochanteric fx after a slip and fall and is now s/p L cephalomedullary nailing 7/31. MD impression also includes hypokalemia.    PT Comments    Pt pleasant and motivated to participate during the session. Pt reported 5/10 pain this a.m. since he had to get up and use the bathroom earlier, which he stated did not feel good on his hip. Pt stated received pain meds about an hour prior to session. Vitals measured in supine measured as: HR 99bpm and SpO2 95%. Pt continued to have a lot of pain with supine therex and required mod-max PT assist to perform SLR and hip abd. Pt was mod-I w/ bed mobility this a.m., an improvement from yesterday, and required no physical assist to manage LLE. Pt continued to require increased time and effort w/ use of handrails for supine-sit.Marland Kitchen Pt able to sit-to-stand and ambulate 65ft with RW and CGA. Gait appeared more steady than yesterday w/ no buckling or LOB noted; pt able to walk much further than yesterday as well, but still had difficulty putting weight through LLE and ambulated with quite antalgic step-to pattern. Pt returned to sit in recliner w/ poor eccentric control and a posterior lean w/ increased pain upon sitting; given re-ed on importance of a slower, controlled, sit with a forward lean. Pt returned to standing and re-attempted sit w/ notably improved control (pillow also placed on chair to help raise surface). Pt will benefit from PT services in a SNF setting upon discharge to safely address deficits listed in patient problem list for decreased caregiver assistance and eventual return to PLOF.   Follow Up  Recommendations  SNF     Equipment Recommendations  Rolling walker with 5" wheels    Recommendations for Other Services       Precautions / Restrictions Precautions Precautions: Fall Restrictions Weight Bearing Restrictions: Yes LLE Weight Bearing: Weight bearing as tolerated    Mobility  Bed Mobility Overal bed mobility: Needs Assistance Bed Mobility: Supine to Sit     Supine to sit: Modified independent (Device/Increase time)     General bed mobility comments: No physical assist req for supine-to-sit this a.m., pt required inc time and effort w/ use of handrails. Significant improvement from yesterday.  Transfers Overall transfer level: Needs assistance Equipment used: Rolling walker (2 wheeled) Transfers: Sit to/from Stand Sit to Stand: Min guard         General transfer comment: Close guard 2/2 pt appearing unsteady and in significant pain. Pt demonstrated poor eccentric control w/ a posterior lean w/ return to sit. Given re-ed on more controlled movement and forward lean w/ return to sit  Ambulation/Gait Ambulation/Gait assistance: Min guard Gait Distance (Feet): 25 Feet Assistive device: Rolling walker (2 wheeled) Gait Pattern/deviations: Step-to pattern;Decreased step length - right;Decreased stance time - left;Antalgic Gait velocity: decreased   General Gait Details: Pt able to walk from bed to door and back to chair at opposite end of room. Pt continues to ambulate w/ an antalgic, step-to, pattern and appeared to have significant pain w/ WB through LLE.Overall ambulation much better than yesterday; pt tolerated a greater distance w/ no observed buckling episodes. Close CGA maintained.   Stairs  Wheelchair Mobility    Modified Rankin (Stroke Patients Only)       Balance Overall balance assessment: Needs assistance Sitting-balance support: No upper extremity supported;Feet supported Sitting balance-Leahy Scale: Good     Standing  balance support: Bilateral upper extremity supported;During functional activity Standing balance-Leahy Scale: Fair Standing balance comment: Heavy lean with BUE on RW with significant pain/difficulty shifting weight onto LLE.                            Cognition Arousal/Alertness: Awake/alert Behavior During Therapy: WFL for tasks assessed/performed Overall Cognitive Status: Within Functional Limits for tasks assessed                                        Exercises Total Joint Exercises Ankle Circles/Pumps: AROM;Both;10 reps;15 reps Hip ABduction/ADduction: AAROM;Strengthening;Left;10 reps Straight Leg Raises: AAROM;Strengthening;Left;10 reps Long Arc Quad: AROM;Strengthening;Left;10 reps Knee Flexion: AROM;Strengthening;Left;10 reps    General Comments        Pertinent Vitals/Pain Pain Assessment: 0-10 Pain Score: 5  Pain Location: L hip Pain Descriptors / Indicators: Aching;Guarding;Grimacing Pain Intervention(s): Limited activity within patient's tolerance;Monitored during session;Premedicated before session;Repositioned    Home Living                      Prior Function            PT Goals (current goals can now be found in the care plan section) Progress towards PT goals: Progressing toward goals    Frequency    BID      PT Plan Current plan remains appropriate    Co-evaluation              AM-PAC PT "6 Clicks" Mobility   Outcome Measure  Help needed turning from your back to your side while in a flat bed without using bedrails?: A Little Help needed moving from lying on your back to sitting on the side of a flat bed without using bedrails?: A Little Help needed moving to and from a bed to a chair (including a wheelchair)?: A Little Help needed standing up from a chair using your arms (e.g., wheelchair or bedside chair)?: A Little Help needed to walk in hospital room?: A Little Help needed climbing 3-5 steps  with a railing? : A Lot 6 Click Score: 17    End of Session Equipment Utilized During Treatment: Gait belt Activity Tolerance: Patient tolerated treatment well Patient left: in chair;with call bell/phone within reach;with chair alarm set;with SCD's reapplied Nurse Communication: Mobility status PT Visit Diagnosis: Unsteadiness on feet (R26.81);Muscle weakness (generalized) (M62.81);Difficulty in walking, not elsewhere classified (R26.2);Pain Pain - Right/Left: Left Pain - part of body: Hip     Time: 1000-1027 PT Time Calculation (min) (ACUTE ONLY): 27 min  Charges:                        Hilde Churchman SPT 09/18/19, 11:42 AM

## 2019-09-18 NOTE — Progress Notes (Signed)
  Subjective: 3 Days Post-Op Procedure(s) (LRB): INTRAMEDULLARY (IM) NAIL INTERTROCHANTRIC (Left) Patient reports pain as mild.   Patient is well, and has had no acute complaints or problems Plan is to go Home after hospital stay. Negative for chest pain and shortness of breath Fever: no Gastrointestinal: Negative for nausea and vomiting  Objective: Vital signs in last 24 hours: Temp:  [98.1 F (36.7 C)-98.9 F (37.2 C)] 98.1 F (36.7 C) (08/03 0017) Pulse Rate:  [92-110] 102 (08/03 0017) Resp:  [15-17] 17 (08/03 0017) BP: (130-148)/(71-85) 148/75 (08/03 0017) SpO2:  [95 %-98 %] 97 % (08/03 0017)  Intake/Output from previous day:  Intake/Output Summary (Last 24 hours) at 09/18/2019 0658 Last data filed at 09/18/2019 0424 Gross per 24 hour  Intake 480 ml  Output 1520 ml  Net -1040 ml    Intake/Output this shift: Total I/O In: -  Out: 600 [Urine:600]  Labs: Recent Labs    09/16/19 0531 09/17/19 0423 09/18/19 0451  HGB 11.0* 10.1* 9.7*   Recent Labs    09/17/19 0423 09/18/19 0451  WBC 4.8 5.2  RBC 2.81* 2.74*  HCT 28.3* 27.7*  PLT 107* 121*   Recent Labs    09/17/19 0423 09/18/19 0451  NA 136 136  K 3.7 3.4*  CL 99 97*  CO2 31 33*  BUN 19 14  CREATININE 1.41* 1.17  GLUCOSE 197* 177*  CALCIUM 7.8* 8.0*   No results for input(s): LABPT, INR in the last 72 hours.   EXAM General - Patient is Alert and Oriented Extremity - Neurovascular intact Sensation intact distally Dorsiflexion/Plantar flexion intact Compartment soft Dressing/Incision - clean, dry, no drainage Motor Function - intact, moving foot and toes well on exam.  Ambulated 22 feet with physical therapy  Past Medical History:  Diagnosis Date  . Diabetes mellitus without complication ([place])   . Hypertension     Assessment/Plan: 3 Days Post-Op Procedure(s) (LRB): INTRAMEDULLARY (IM) NAIL INTERTROCHANTRIC (Left) Principal Problem:   Closed left hip fracture, initial encounter  (HCC) Active Problems:   Diabetes mellitus without complication (HCC)   Chronic kidney disease, stage 3a   Hypertension   Alcohol dependence ([place])  Estimated body mass index is 25.09 kg/m as calculated from the following:   Height as of this encounter: 5\' 8"  (1.727 m).   Weight as of this encounter: 74.8 kg. Advance diet Up with therapy D/C IV fluids  Follow-up at [facility] orthopedics in 2 weeks for staple removal and x-rays of the left femur  DVT Prophylaxis - Lovenox, Foot Pumps and TED hose Weight-Bearing as tolerated to left leg  Reche Dixon, PA-C Orthopaedic Surgery 09/18/2019, 6:58 AM

## 2019-09-18 NOTE — Progress Notes (Signed)
Report called to [facility] @ [facility]

## 2019-09-18 NOTE — TOC Progression Note (Signed)
Transition of Care [facility]) - Progression Note    Patient Details  Name: Corey Gonzalez MRN: [id number] Date of Birth: 04-21-1940  Transition of Care [facility]) CM/SW [place], RN Phone Number: 09/18/2019, 12:06 PM  Clinical Narrative:     DC packet on the chart, The patient is unable to find the Vaccine card. The bedside nurse is to call report then let me know when to call EMS to transport       Expected Discharge Plan and Services           Expected Discharge Date: 09/18/19                                     Social Determinants of Health (SDOH) Interventions    Readmission Risk Interventions No flowsheet data found.

## 2019-09-18 NOTE — TOC Progression Note (Signed)
Transition of Care [facility]) - Progression Note    Patient Details  Name: Corey Gonzalez MRN: [id number] Date of Birth: Dec 30, 1940  [facility]) CM/SW [place], RN Phone Number: 09/18/2019, 1:51 PM  Clinical Narrative:    Bedside  Nurse has called report, TOC CM called EMS for pick up, the patient will notify the family        Expected Discharge Plan and Services           Expected Discharge Date: 09/18/19                                     Social Determinants of Health (SDOH) Interventions    Readmission Risk Interventions No flowsheet data found.

## 2019-09-18 NOTE — Progress Notes (Signed)
EMS here transport pt

## 2019-09-18 NOTE — Care Management Important Message (Signed)
Important Message  Patient Details  Name: Corey Gonzalez MRN: [id number] Date of Birth: [birth year]   Medicare Important Message Given:  N/A - LOS <3 / Initial given by admissions     [name] 09/18/2019, 9:38 AM

## 2020-12-30 ENCOUNTER — Emergency Department: Payer: No Typology Code available for payment source

## 2020-12-30 ENCOUNTER — Inpatient Hospital Stay
Admission: EM | Admit: 2020-12-30 | Discharge: 2021-01-05 | DRG: 480 | Disposition: A | Discharge status: Home Health | Payer: No Typology Code available for payment source | Attending: Internal Medicine | Admitting: Internal Medicine

## 2020-12-30 ENCOUNTER — Other Ambulatory Visit: Payer: Self-pay

## 2020-12-30 ENCOUNTER — Inpatient Hospital Stay: Payer: No Typology Code available for payment source

## 2020-12-30 DIAGNOSIS — Z419 Encounter for procedure for purposes other than remedying health state, unspecified: Secondary | ICD-10-CM

## 2020-12-30 DIAGNOSIS — E440 Moderate protein-calorie malnutrition: Secondary | ICD-10-CM | POA: Diagnosis present

## 2020-12-30 DIAGNOSIS — Z720 Tobacco use: Secondary | ICD-10-CM | POA: Diagnosis present

## 2020-12-30 DIAGNOSIS — W19XXXA Unspecified fall, initial encounter: Secondary | ICD-10-CM

## 2020-12-30 DIAGNOSIS — Y9301 Activity, walking, marching and hiking: Secondary | ICD-10-CM | POA: Diagnosis present

## 2020-12-30 DIAGNOSIS — S72002A Fracture of unspecified part of neck of left femur, initial encounter for closed fracture: Secondary | ICD-10-CM

## 2020-12-30 DIAGNOSIS — I129 Hypertensive chronic kidney disease with stage 1 through stage 4 chronic kidney disease, or unspecified chronic kidney disease: Secondary | ICD-10-CM | POA: Diagnosis present

## 2020-12-30 DIAGNOSIS — N1831 Chronic kidney disease, stage 3a: Secondary | ICD-10-CM

## 2020-12-30 DIAGNOSIS — Z833 Family history of diabetes mellitus: Secondary | ICD-10-CM

## 2020-12-30 DIAGNOSIS — I10 Essential (primary) hypertension: Secondary | ICD-10-CM

## 2020-12-30 DIAGNOSIS — Z96653 Presence of artificial knee joint, bilateral: Secondary | ICD-10-CM | POA: Diagnosis present

## 2020-12-30 DIAGNOSIS — Z933 Colostomy status: Secondary | ICD-10-CM | POA: Diagnosis not present

## 2020-12-30 DIAGNOSIS — Z79899 Other long term (current) drug therapy: Secondary | ICD-10-CM

## 2020-12-30 DIAGNOSIS — U071 COVID-19: Secondary | ICD-10-CM

## 2020-12-30 DIAGNOSIS — F1020 Alcohol dependence, uncomplicated: Secondary | ICD-10-CM | POA: Diagnosis present

## 2020-12-30 DIAGNOSIS — W010XXA Fall on same level from slipping, tripping and stumbling without subsequent striking against object, initial encounter: Secondary | ICD-10-CM | POA: Diagnosis present

## 2020-12-30 DIAGNOSIS — E119 Type 2 diabetes mellitus without complications: Secondary | ICD-10-CM | POA: Diagnosis not present

## 2020-12-30 DIAGNOSIS — Z9181 History of falling: Secondary | ICD-10-CM

## 2020-12-30 DIAGNOSIS — F17200 Nicotine dependence, unspecified, uncomplicated: Secondary | ICD-10-CM | POA: Diagnosis present

## 2020-12-30 DIAGNOSIS — E1122 Type 2 diabetes mellitus with diabetic chronic kidney disease: Secondary | ICD-10-CM | POA: Diagnosis present

## 2020-12-30 DIAGNOSIS — C189 Malignant neoplasm of colon, unspecified: Secondary | ICD-10-CM | POA: Diagnosis present

## 2020-12-30 DIAGNOSIS — M9712XA Periprosthetic fracture around internal prosthetic left knee joint, initial encounter: Secondary | ICD-10-CM | POA: Diagnosis present

## 2020-12-30 DIAGNOSIS — Y92009 Unspecified place in unspecified non-institutional (private) residence as the place of occurrence of the external cause: Secondary | ICD-10-CM | POA: Diagnosis not present

## 2020-12-30 DIAGNOSIS — K219 Gastro-esophageal reflux disease without esophagitis: Secondary | ICD-10-CM | POA: Diagnosis present

## 2020-12-30 DIAGNOSIS — Z96641 Presence of right artificial hip joint: Secondary | ICD-10-CM | POA: Diagnosis present

## 2020-12-30 DIAGNOSIS — Z6820 Body mass index (BMI) 20.0-20.9, adult: Secondary | ICD-10-CM | POA: Diagnosis not present

## 2020-12-30 DIAGNOSIS — D62 Acute posthemorrhagic anemia: Secondary | ICD-10-CM | POA: Diagnosis not present

## 2020-12-30 DIAGNOSIS — S72342A Displaced spiral fracture of shaft of left femur, initial encounter for closed fracture: Secondary | ICD-10-CM | POA: Diagnosis present

## 2020-12-30 DIAGNOSIS — S7292XA Unspecified fracture of left femur, initial encounter for closed fracture: Secondary | ICD-10-CM | POA: Diagnosis present

## 2020-12-30 DIAGNOSIS — Z7984 Long term (current) use of oral hypoglycemic drugs: Secondary | ICD-10-CM | POA: Diagnosis not present

## 2020-12-30 LAB — CBC
HCT: 27 % — ABNORMAL LOW (ref 39.0–52.0)
Hemoglobin: 8.9 g/dL — ABNORMAL LOW (ref 13.0–17.0)
MCH: 31.1 pg (ref 26.0–34.0)
MCHC: 33 g/dL (ref 30.0–36.0)
MCV: 94.4 fL (ref 80.0–100.0)
Platelets: 160 10*3/uL (ref 150–400)
RBC: 2.86 MIL/uL — ABNORMAL LOW (ref 4.22–5.81)
RDW: 16.6 % — ABNORMAL HIGH (ref 11.5–15.5)
WBC: 5.5 10*3/uL (ref 4.0–10.5)
nRBC: 0 % (ref 0.0–0.2)

## 2020-12-30 LAB — BASIC METABOLIC PANEL
Anion gap: 7 (ref 5–15)
BUN: 25 mg/dL — ABNORMAL HIGH (ref 8–23)
CO2: 26 mmol/L (ref 22–32)
Calcium: 8.9 mg/dL (ref 8.9–10.3)
Chloride: 103 mmol/L (ref 98–111)
Creatinine, Ser: 1.15 mg/dL (ref 0.61–1.24)
GFR, Estimated: >60 mL/min (ref >60)
Glucose, Bld: 211 mg/dL — ABNORMAL HIGH (ref 70–99)
Potassium: 4.6 mmol/L (ref 3.5–5.1)
Sodium: 136 mmol/L (ref 135–145)

## 2020-12-30 LAB — ETHANOL: Alcohol, Ethyl (B): <10 mg/dL (ref <10)

## 2020-12-30 IMAGING — CR DG FEMUR 2+V*L*
4 series · 5 of 5 positions shown · non-contrast
Comparison: [DATE]

CLINICAL DATA: Trauma, pain

EXAM:
LEFT FEMUR 2 VIEWS

[femur ap (1 of 2)]
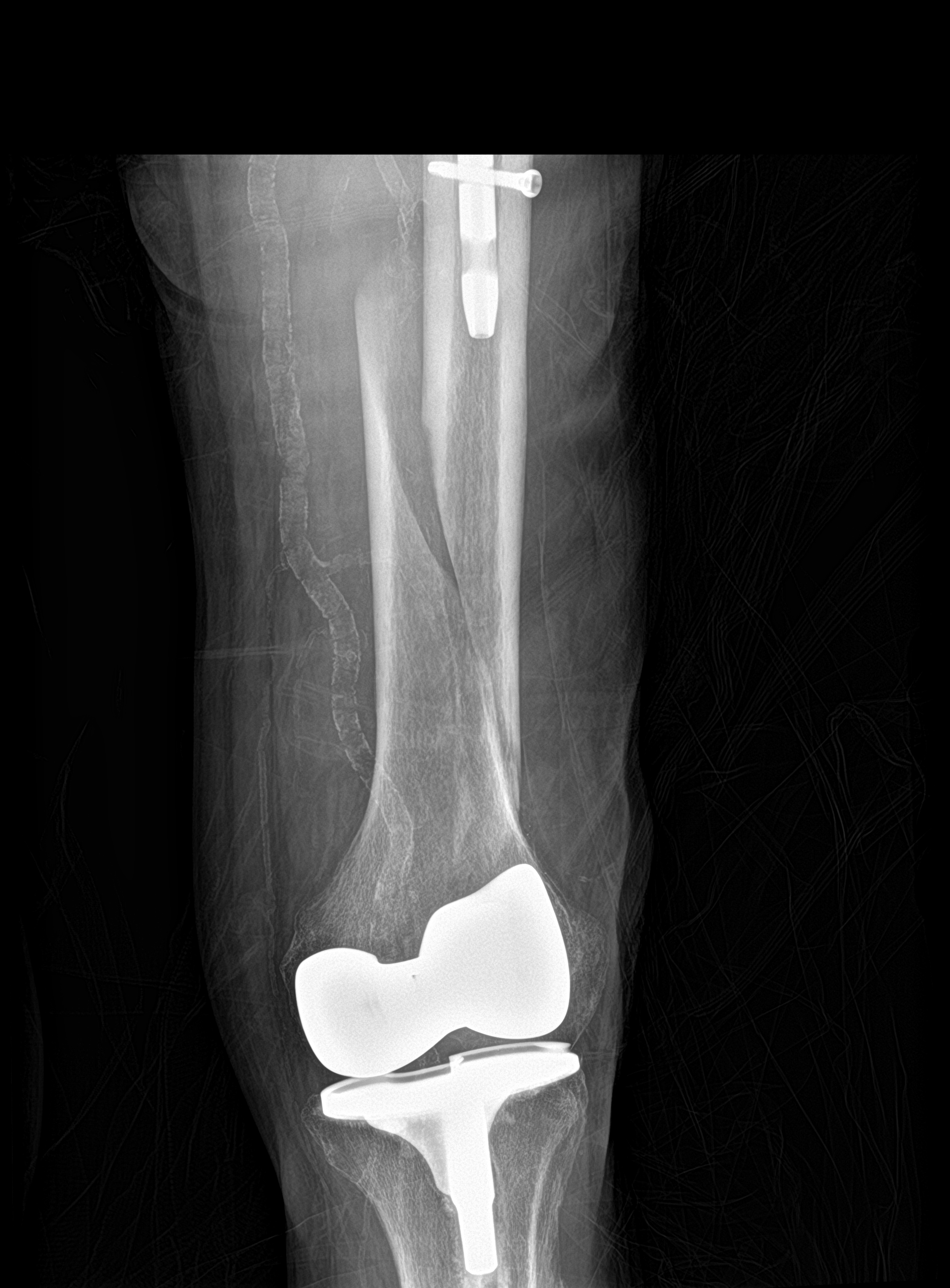

[femur ap (2 of 2)]
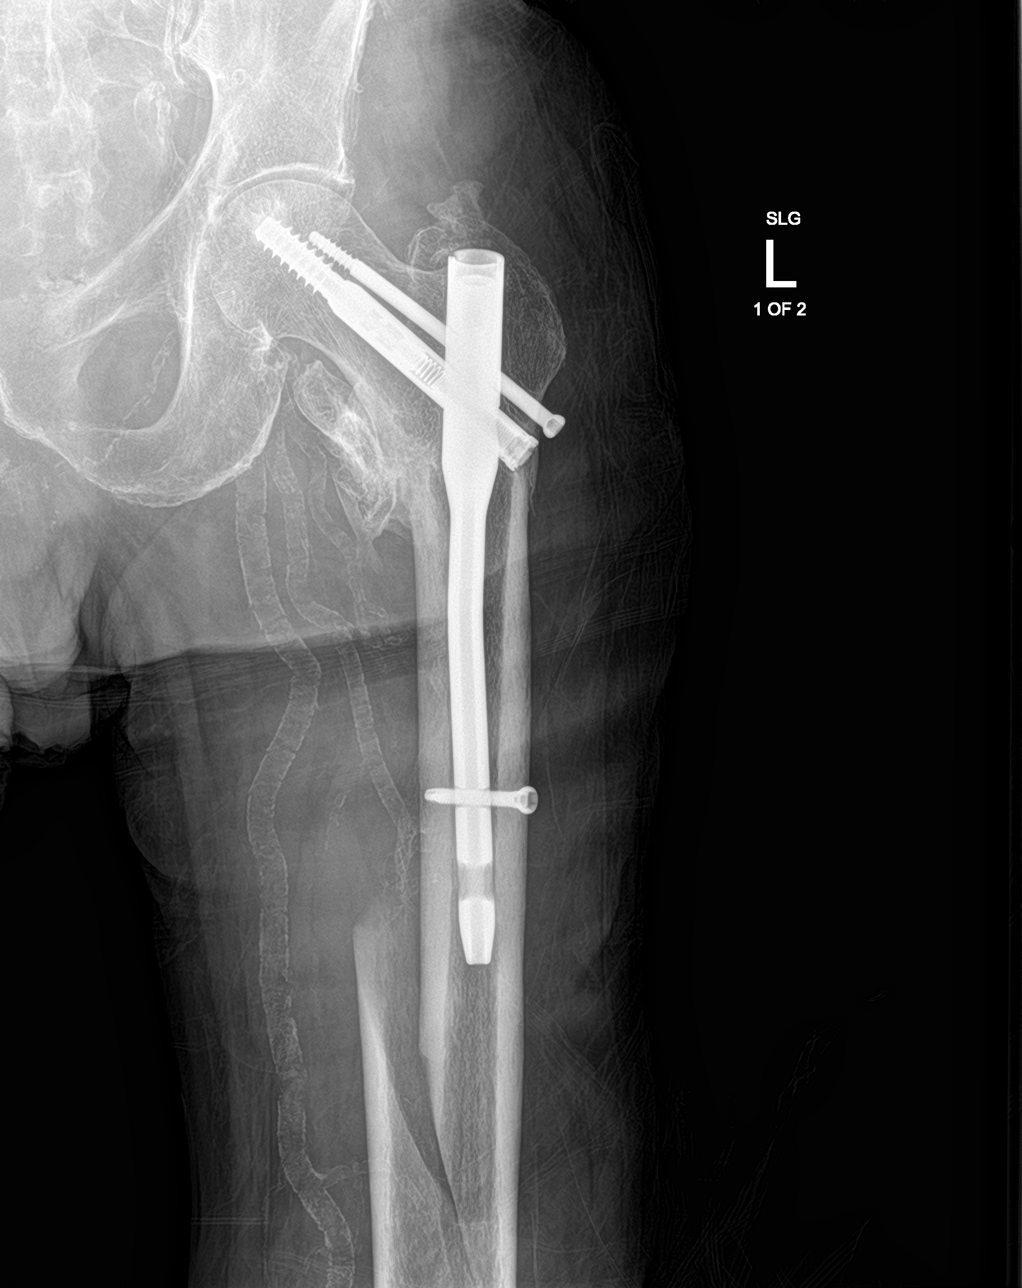

[Series 3: femur lat · 0.14mm/px · 2 of 2 slices shown (1 of 2)]
[im 1/2]
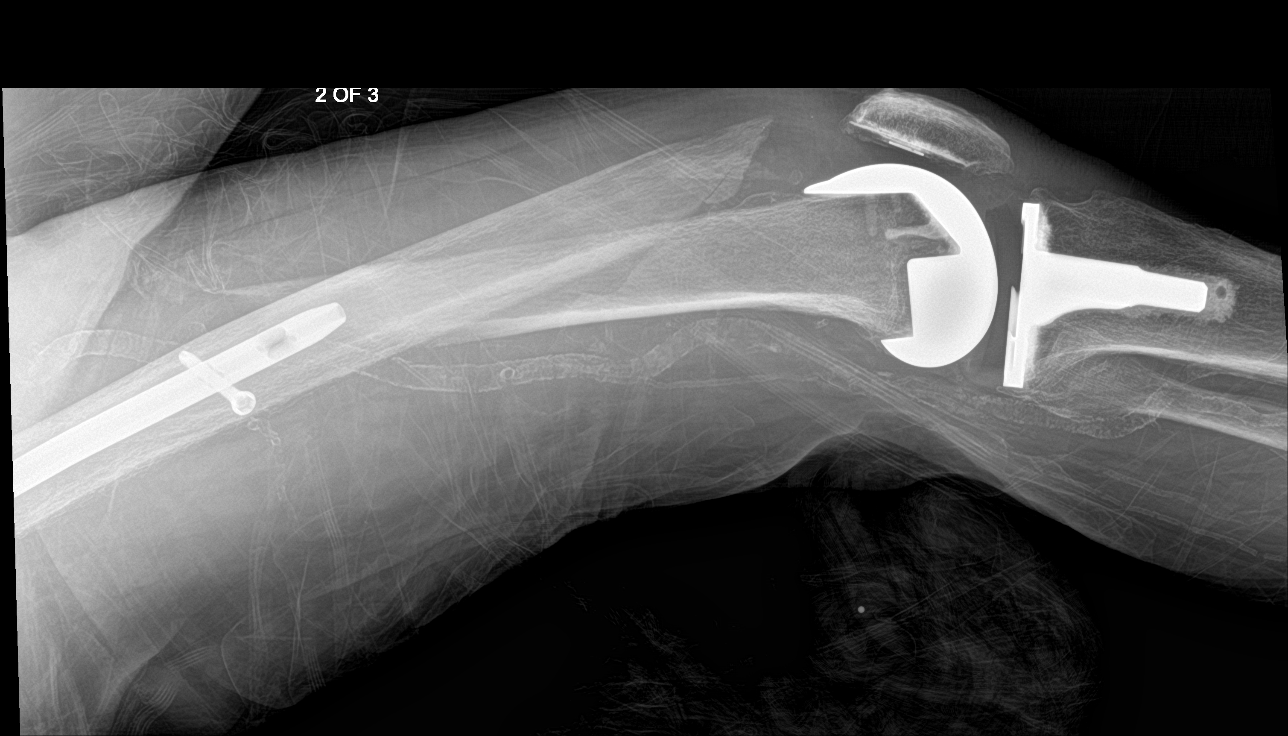
[im 2/2]
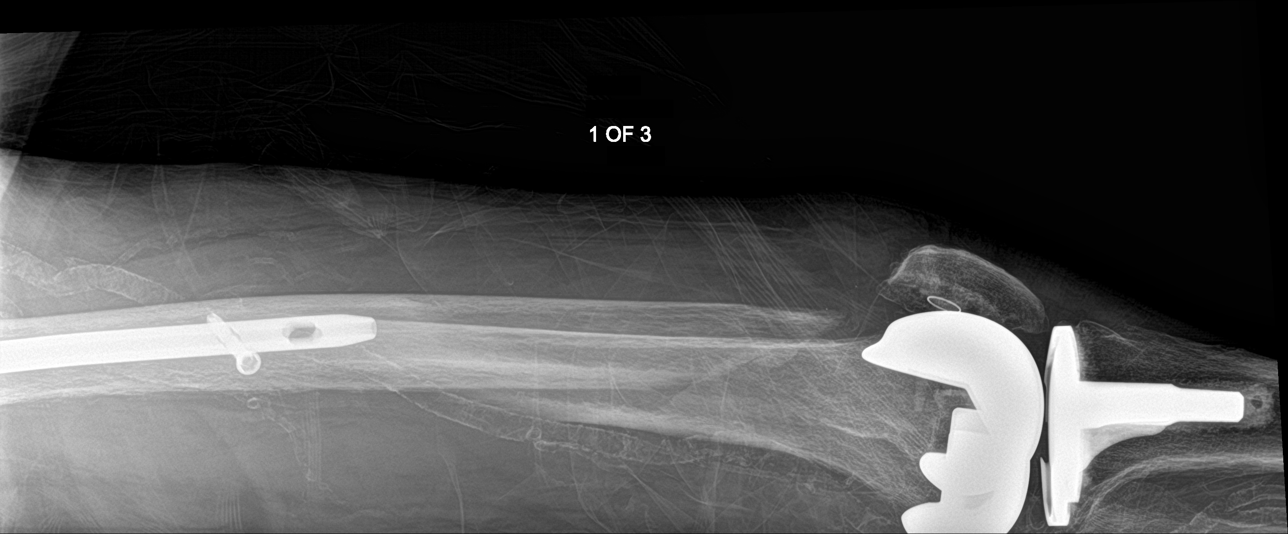

[femur lat (2 of 2)]
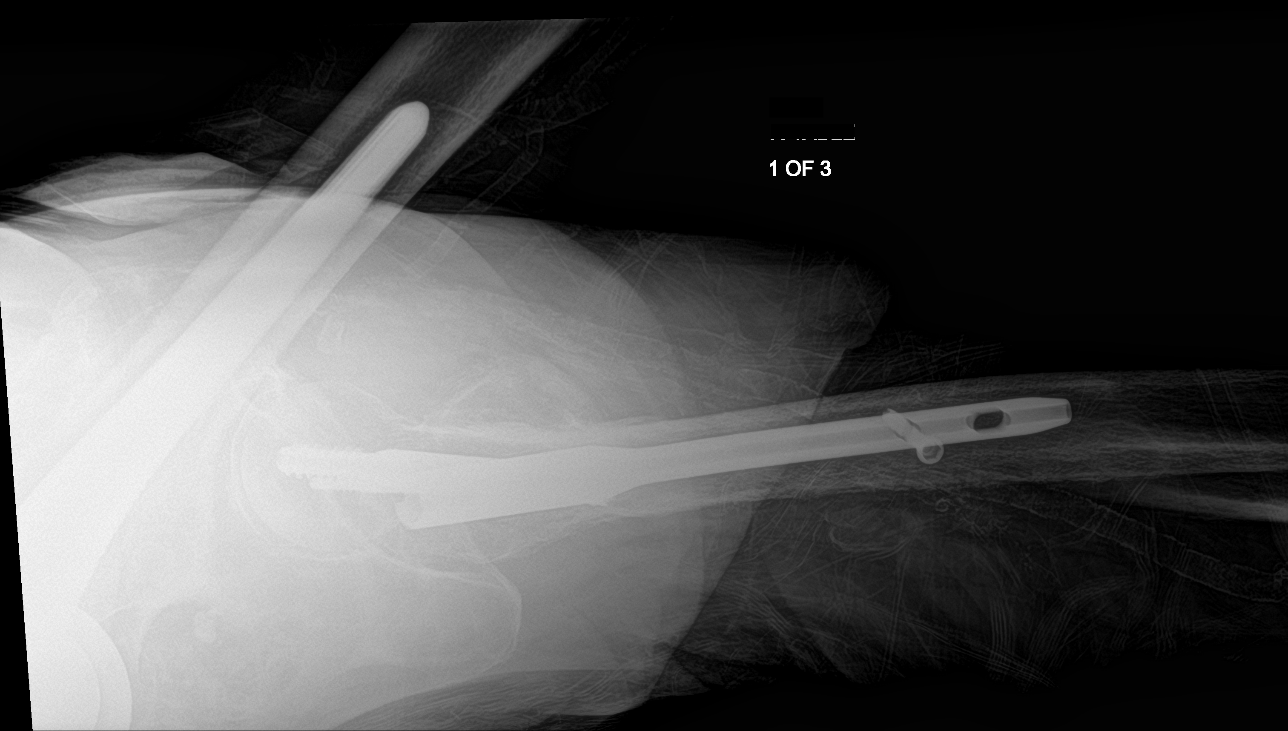

[5 of 5 positions shown; findings below may reference images not displayed]

FINDINGS: There is spiral fracture in the mid to distal shaft of left femur.
There is distraction of the fracture fragments with posterior
displacement of distal major fracture fragment. There is previous
internal fixation of intertrochanteric fracture of left femur. There
is previous left knee arthroplasty. Extensive arterial
calcifications are seen in the soft tissues.
IMPRESSION: There is displaced oblique/spiral fracture in the mid and distal
shaft of left femur.

Other findings as described in the body of the report.

## 2020-12-30 IMAGING — CR DG PELVIS 1-2V
1 series · 1 of 1 positions shown · non-contrast
Comparison: [DATE]

CLINICAL DATA: Trauma, fall, pain

EXAM:
PELVIS - 1-2 VIEW

[pelvis ap]
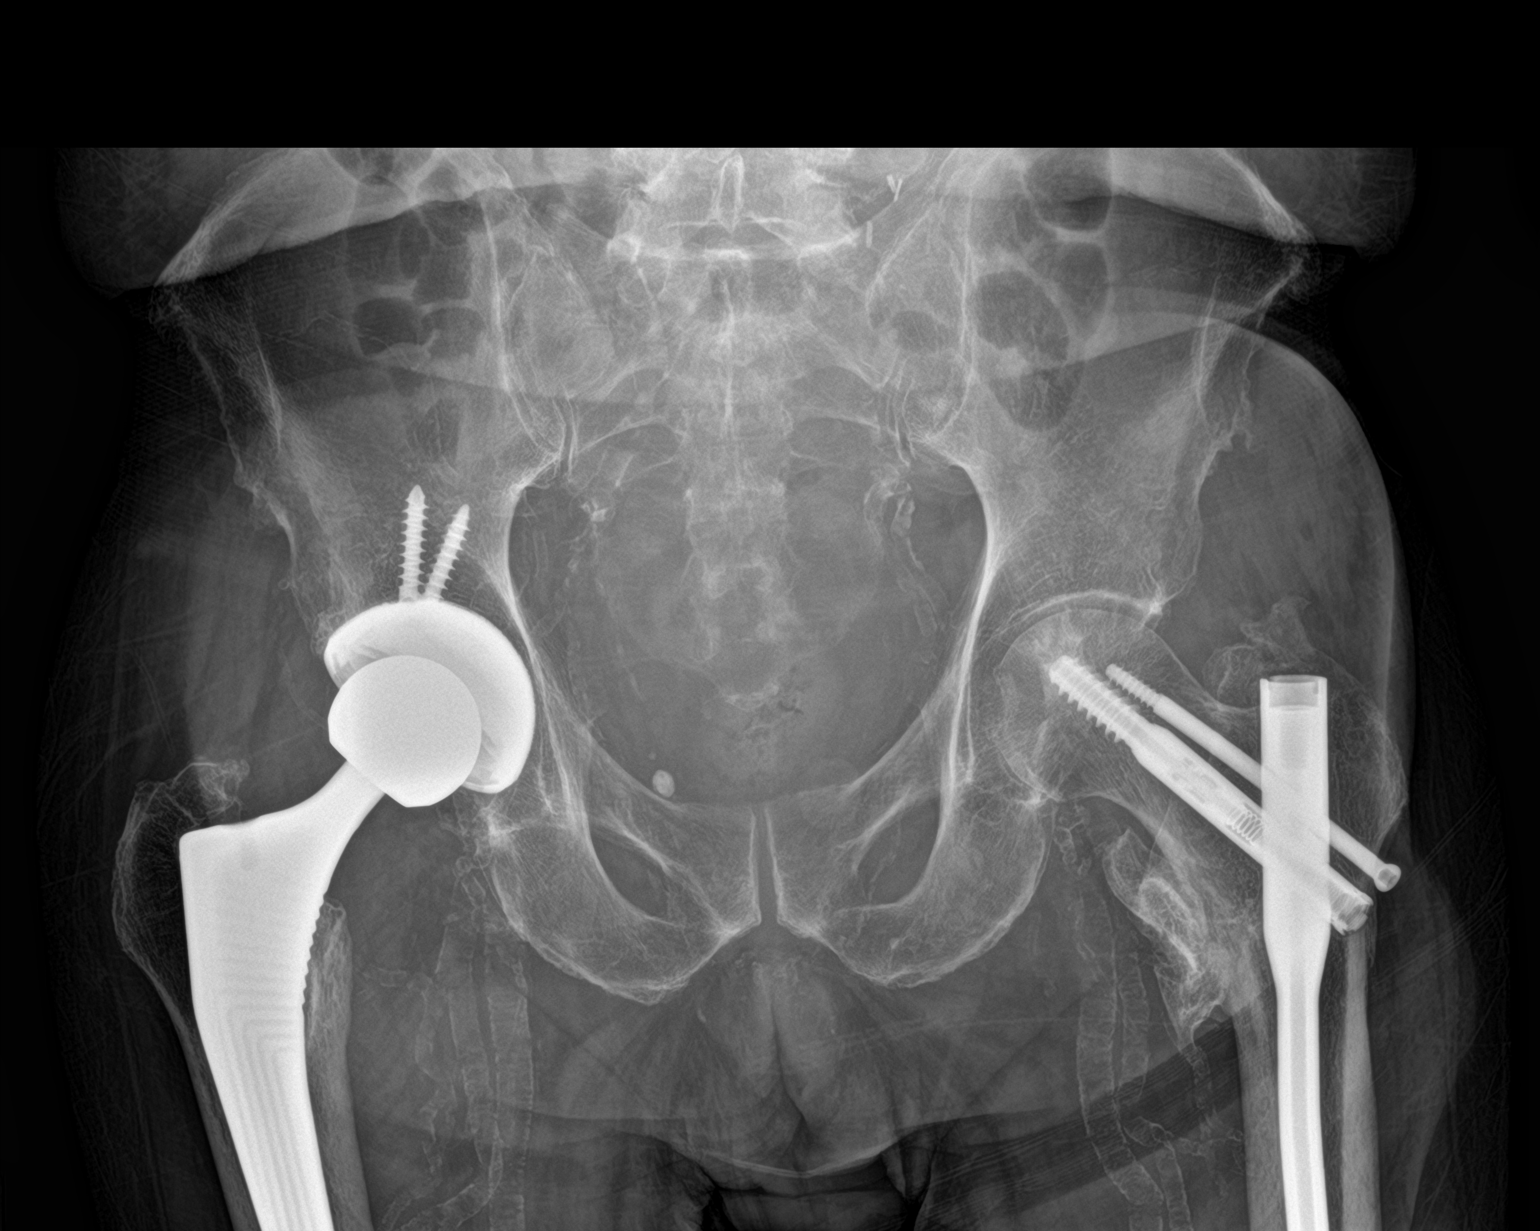

[1 of 1 positions shown; findings below may reference images not displayed]

FINDINGS: No recent displaced fracture is seen. Osteopenia is seen in bony
structures. There is previous right hip arthroplasty. There is
previous internal fixation of intertrochanteric fracture of left
femur with intramedullary rod. Extensive arterial calcifications are
seen. Degenerative changes are noted in the visualized lower lumbar
spine.
IMPRESSION: No recent fracture is seen.

Other findings as described in the body of the report.

## 2020-12-30 MED ORDER — OXYCODONE HCL 5 MG PO TABS
5.0000 mg | ORAL_TABLET | ORAL | Status: DC | PRN
  Administered 2020-12-31 – 2021-01-05 (×6): 5 mg via ORAL
  Filled 2020-12-30 (×6): qty 1

## 2020-12-30 MED ORDER — MORPHINE SULFATE (PF) 4 MG/ML IV SOLN
4.0000 mg | Freq: Once | INTRAVENOUS | Status: AC
  Administered 2020-12-30: 4 mg via INTRAVENOUS
  Filled 2020-12-30: qty 1

## 2020-12-30 MED ORDER — NICOTINE 14 MG/24HR TD PT24
14.0000 mg | MEDICATED_PATCH | Freq: Every day | TRANSDERMAL | Status: DC
  Administered 2020-12-31 – 2021-01-05 (×6): 14 mg via TRANSDERMAL
  Filled 2020-12-30 (×6): qty 1

## 2020-12-30 MED ORDER — ONDANSETRON HCL 4 MG/2ML IJ SOLN
4.0000 mg | Freq: Once | INTRAMUSCULAR | Status: AC
  Administered 2020-12-30: 4 mg via INTRAVENOUS
  Filled 2020-12-30: qty 2

## 2020-12-30 MED ORDER — FENTANYL CITRATE PF 50 MCG/ML IJ SOSY: 50.0000 ug | PREFILLED_SYRINGE | INTRAMUSCULAR | Status: DC | PRN

## 2020-12-30 MED ORDER — HYDROCODONE-ACETAMINOPHEN 5-325 MG PO TABS
1.0000 | ORAL_TABLET | Freq: Four times a day (QID) | ORAL | Status: DC | PRN
  Administered 2020-12-31: 1 via ORAL
  Administered 2020-12-31 – 2021-01-02 (×3): 2 via ORAL
  Administered 2021-01-03 – 2021-01-04 (×4): 1 via ORAL
  Filled 2020-12-30 (×5): qty 2
  Filled 2020-12-30 (×3): qty 1

## 2020-12-30 MED ORDER — MORPHINE SULFATE (PF) 2 MG/ML IV SOLN: 0.5000 mg | INTRAVENOUS | Status: DC | PRN

## 2020-12-30 NOTE — ED Triage Notes (Signed)
Pt here via [facility] with a fall today. Pt received 166mcg of fentanyl with ems. Pt got up to pen a door and said he heard a "snap". EMS states pt's left knee is grossly swollen and was at a 90 degree angle. Pt also c/o left hip pain.  38-end tidal 98% 138/87 20G LAC

## 2020-12-30 NOTE — ED Provider Notes (Signed)
[facility] EMERGENCY DEPARTMENT Provider Note   CSN: [id number] Arrival date & time: 12/30/20  1424     History Chief Complaint  Patient presents with   Corey Gonzalez is a 80 y.o. male presents to the emergency department for evaluation of a fall that occurred earlier today.  Patient states he was walking up a ramp, lost his balance and fell backwards, felt a snap in his left femur.  He was unable to bear weight.  Denies any other injury to his body, no head injury, LOC, headache, nausea or vomiting.  He denies any back pain, ankle pain.  No right lower extremity discomfort.  Pain is moderate to severe.  No numbness or tingling throughout the left leg.  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  No complication from colon resection, states he was on Eliquis but has finished his Eliquis.  Patient has a history of left hip inotrope fracture back in July 2021, he recovered well from this.  He also has a history of left total knee arthroplasty performed in 2011.  Patient was found to be COVID-positive today.  Denies any cough congestion runny nose fevers chills or body aches.  Denies any abdominal pain nausea or vomiting.  HPI     Past Medical History:  Diagnosis Date   Diabetes mellitus without complication ([place])    Hypertension     Patient Active Problem List   Diagnosis Date Noted   Closed left hip fracture, initial encounter ([place]) 09/14/2019   Diabetes mellitus without complication (HCC)    Chronic kidney disease, stage 3a ([place])    Hypertension    Alcohol dependence ([place])     Past Surgical History:  Procedure Laterality Date   INTRAMEDULLARY (IM) NAIL INTERTROCHANTERIC Left 09/15/2019   Procedure: INTRAMEDULLARY (IM) NAIL INTERTROCHANTRIC;  Surgeon: Leim Fabry, MD;  Location: [facility] ORS;  Service: Orthopedics;   Laterality: Left;   JOINT REPLACEMENT     bilateral knees   JOINT REPLACEMENT Right    hip       Family History  Problem Relation Age of Onset   Diabetes Other     Social History   Tobacco Use   Smoking status: Some Days   Smokeless tobacco: Never   Tobacco comments:    ~5 per week  Substance Use Topics   Alcohol use: Yes    Alcohol/week: 35.0 standard drinks    Types: 35 Standard drinks or equivalent per week    Comment: 3 mixed (liquor) drinks nightly    Home Medications Prior to Admission medications   Medication Sig Start Date End Date Taking? Authorizing Provider  amLODipine (NORVASC) 5 MG tablet Take 1 tablet (5 mg total) by mouth daily. 09/18/19 09/17/20  Georgette Shell, MD  bisacodyl (DULCOLAX) 10 MG suppository Place 1 suppository (10 mg total) rectally daily as needed for moderate constipation. 09/18/19   Georgette Shell, MD  docusate sodium (COLACE) 100 MG capsule Take 1 capsule (100 mg total) by mouth 2 (two) times daily. 09/18/19   Georgette Shell, MD  enoxaparin (LOVENOX) 40 MG/0.4ML injection Inject 0.4 mLs (40 mg total) into the skin daily for 14 doses. 09/16/19 09/30/19  Reche Dixon, PA-C  metFORMIN (GLUCOPHAGE) 500 MG tablet Take 1 tablet (500 mg total) by mouth daily with breakfast. 09/18/19 09/17/20  Georgette Shell, MD  ondansetron [facility])  4 MG tablet Take 1 tablet (4 mg total) by mouth every 6 (six) hours as needed for nausea. 09/16/19   Reche Dixon, PA-C  oxyCODONE (OXY IR/ROXICODONE) 5 MG immediate release tablet Take 1 tablet (5 mg total) by mouth every 4 (four) hours as needed for severe pain (pain score 7-10). 09/16/19   Reche Dixon, PA-C  senna-docusate (SENOKOT-S) 8.6-50 MG tablet Take 1 tablet by mouth at bedtime as needed for mild constipation. 09/18/19   Georgette Shell, MD  traMADol (ULTRAM) 50 MG tablet Take 1 tablet (50 mg total) by mouth every 6 (six) hours as needed for moderate pain. 09/16/19   Reche Dixon, PA-C    Allergies     Patient has no known allergies.  Review of Systems   Review of Systems  Constitutional:  Negative for chills and fever.  Respiratory:  Negative for cough, chest tightness and shortness of breath.   Cardiovascular:  Negative for chest pain.  Gastrointestinal:  Negative for abdominal pain, nausea and vomiting.  Musculoskeletal:  Positive for gait problem and myalgias. Negative for arthralgias, back pain, joint swelling, neck pain and neck stiffness.  Skin:  Negative for rash and wound.  Neurological:  Negative for dizziness, light-headedness, numbness and headaches.  Psychiatric/Behavioral:  Negative for confusion and decreased concentration.    Physical Exam Updated Vital Signs BP 106/62 (BP Location: Left Arm)   Pulse 100   Temp 98.5 F (36.9 C) (Oral)   Resp 18   Ht 5\' 7"  (1.702 m)   Wt 58.5 kg   SpO2 97%   BMI 20.20 kg/m   Physical Exam Constitutional:      Appearance: He is well-developed.  HENT:     Head: Normocephalic and atraumatic.     Nose: Nose normal.  Eyes:     Extraocular Movements: Extraocular movements intact.     Conjunctiva/sclera: Conjunctivae normal.     Pupils: Pupils are equal, round, and reactive to light.  Cardiovascular:     Rate and Rhythm: Normal rate.     Pulses: Normal pulses.     Heart sounds: Normal heart sounds.  Pulmonary:     Effort: Pulmonary effort is normal. No respiratory distress.  Abdominal:     General: Abdomen is flat. There is no distension.     Palpations: Abdomen is soft.     Tenderness: There is no abdominal tenderness. There is no guarding.     Comments: Colostomy bag intact  Musculoskeletal:        General: Swelling, tenderness and signs of injury present.     Cervical back: Normal range of motion.     Comments: No cervical thoracic or lumbar spinous process tenderness.  Patient nontender along the right lower extremity.  Has tenderness to the left mid thigh with mild swelling.  Compartments no ecchymosis.  Mild pain  with tenderness to palpation along the mid thigh.  He has no swelling throughout the lower leg, normal ankle plantar flexion dorsiflexion.  Dorsalis pedis pulses are present bilaterally.  Nontender throughout bilateral ankles.  Patient has no tenderness along both clavicles, shoulders elbows and wrist    Skin:    General: Skin is warm.     Findings: No rash.  Neurological:     General: No focal deficit present.     Mental Status: He is alert and oriented to person, place, and time. Mental status is at baseline.     Cranial Nerves: No cranial nerve deficit.     Motor: No weakness.  Psychiatric:        Mood and Affect: Mood normal.        Behavior: Behavior normal.        Thought Content: Thought content normal.        Judgment: Judgment normal.    ED Results / Procedures / Treatments   Labs (all labs ordered are listed, but only abnormal results are displayed) Labs Reviewed  RESP PANEL BY RT-PCR (FLU A&B, COVID) ARPGX2 - Abnormal; Notable for the following components:      Result Value   SARS Coronavirus 2 by RT PCR POSITIVE (*)    All other components within normal limits  CBC - Abnormal; Notable for the following components:   RBC 2.86 (*)    Hemoglobin 8.9 (*)    HCT 27.0 (*)    RDW 16.6 (*)    All other components within normal limits  BASIC METABOLIC PANEL - Abnormal; Notable for the following components:   Glucose, Bld 211 (*)    BUN 25 (*)    All other components within normal limits  TYPE AND SCREEN    EKG None  Radiology DG Pelvis 1-2 Views  Result Date: 12/30/2020 CLINICAL DATA:  Trauma, fall, pain EXAM: PELVIS - 1-2 VIEW COMPARISON:  09/15/2019 FINDINGS: No recent displaced fracture is seen. Osteopenia is seen in bony structures. There is previous right hip arthroplasty. There is previous internal fixation of intertrochanteric fracture of left femur with intramedullary rod. Extensive arterial calcifications are seen. Degenerative changes are noted in the  visualized lower lumbar spine. IMPRESSION: No recent fracture is seen. Other findings as described in the body of the report. Electronically Signed   By: Elmer Picker M.D.   On: 12/30/2020 16:40   DG FEMUR MIN 2 VIEWS LEFT  Result Date: 12/30/2020 CLINICAL DATA:  Trauma, pain EXAM: LEFT FEMUR 2 VIEWS COMPARISON:  09/14/2019 FINDINGS: There is spiral fracture in the mid to distal shaft of left femur. There is distraction of the fracture fragments with posterior displacement of distal major fracture fragment. There is previous internal fixation of intertrochanteric fracture of left femur. There is previous left knee arthroplasty. Extensive arterial calcifications are seen in the soft tissues. IMPRESSION: There is displaced oblique/spiral fracture in the mid and distal shaft of left femur. Other findings as described in the body of the report. Electronically Signed   By: Elmer Picker M.D.   On: 12/30/2020 16:43    Procedures Procedures   Medications Ordered in ED Medications  fentaNYL (SUBLIMAZE) injection 50 mcg (has no administration in time range)  morphine 4 MG/ML injection 4 mg (4 mg Intravenous Given 12/30/20 1600)  ondansetron (ZOFRAN) injection 4 mg (4 mg Intravenous Given 12/30/20 1628)  morphine 4 MG/ML injection 4 mg (4 mg Intravenous Given 12/30/20 2055)    ED Course  I have reviewed the triage vital signs and the nursing notes.  Pertinent labs & imaging results that were available during my care of the patient were reviewed by me and considered in my medical decision making (see chart for details).    MDM Rules/Calculators/A&P                         80 year old male with history of hypertension, diabetes and recent colon resection with colostomy bag placement.  Patient fell earlier today felt pain in his left mid femur.  X-ray showed displaced femoral shaft fracture.  Patient denies any other pain or injury to his body.  Pain controlled with morphine 4 mg IV.   Patient found to be positive for COVID although he is asymptomatic.  His vital signs are stable.  He appears well in no distress.  Discussed with orthopedist who will plan on open reduction internal fixation tomorrow.  N.p.o. orders placed.  Will admit to hospitalist service. Final Clinical Impression(s) / ED Diagnoses Final diagnoses:  Fall, initial encounter  Closed displaced spiral fracture of shaft of left femur, initial encounter [facility])  Lab test positive for detection of COVID-19 virus    Rx / DC Orders ED Discharge Orders     None        Renata Caprice 12/30/20 2118    Duffy Bruce, MD 12/30/20 2124

## 2020-12-30 NOTE — H&P (Signed)
History and Physical    Corey Gonzalez PVX:[id number] DOB: 1940-07-14 DOA: 12/30/2020  PCP: Pcp, No    Patient coming from:  Home    Chief Complaint:  Fall / Left femoral fracture.    HPI:  Corey Gonzalez is a 80 y.o. male seen in ed with complaints of fall and left hip pain.  Pt is covid-19 positive , incidental today. Pt does not have any symptoms.  Patient gives HPI in the ED states he was walking up a ramp where he lost his balance and fell backwards describing more mechanical fall.  After the fall patient was unable to bear weight on his left leg because of pain.  However did not have any numbness or tingling saddle anesthesia or paresthesias elsewhere.  Denies any headaches blurred vision speech or gait issues, chest pain palpitation shortness of breath, review of systems is negative.  Patient was recently discharged yesterday from short-term rehab after having colon resection in September.  Pt has past medical history of closed left hip fracture, diabetes mellitus type 2, CKD stage III, hypertension, alcohol dependence, tobacco abuse.   ED Course: In the emergency room patient is alert awake oriented afebrile oxygenating on room air 97%. Vitals:   12/30/20 1439 12/30/20 2018 12/30/20 2135 12/30/20 2216  BP: (!) 160/82 106/62 (!) 94/54 (!) 110/93  Pulse: 90 100 99 98  Resp: 17 18 18 18   Temp: 98.5 F (36.9 C)     TempSrc: Oral     SpO2: 95% 97% 97% 100%  Weight:      Height:      Blood work today shows glucose of 211, creatinine of 1.13 CBC shows a hemoglobin of 8.9, platelet count of 160.  In the emergency room patient received Morphine and Zofran.  Imaging shows a displaced oblique spiral fracture in the mid and distal shaft of the left femur. Pt states he no longer drinks any alcohol , he does smoke.  Review of Systems:  Review of Systems  Musculoskeletal:  Positive for falls and joint pain.  All other systems reviewed and are negative.   Past Medical History:   Diagnosis Date   Diabetes mellitus without complication ([place])    Hypertension     Past Surgical History:  Procedure Laterality Date   INTRAMEDULLARY (IM) NAIL INTERTROCHANTERIC Left 09/15/2019   Procedure: INTRAMEDULLARY (IM) NAIL INTERTROCHANTRIC;  Surgeon: Leim Fabry, MD;  Location: [facility] ORS;  Service: Orthopedics;  Laterality: Left;   JOINT REPLACEMENT     bilateral knees   JOINT REPLACEMENT Right    hip     reports that he has been smoking. He has never used smokeless tobacco. He reports current alcohol use of about 35.0 standard drinks per week. No history on file for drug use.  No Known Allergies  Family History  Problem Relation Age of Onset   Diabetes Other     Prior to Admission medications   Medication Sig Start Date End Date Taking? Authorizing Provider  amLODipine (NORVASC) 5 MG tablet Take 1 tablet (5 mg total) by mouth daily. 09/18/19 09/17/20  Georgette Shell, MD  bisacodyl (DULCOLAX) 10 MG suppository Place 1 suppository (10 mg total) rectally daily as needed for moderate constipation. 09/18/19   Georgette Shell, MD  docusate sodium (COLACE) 100 MG capsule Take 1 capsule (100 mg total) by mouth 2 (two) times daily. 09/18/19   Georgette Shell, MD  enoxaparin (LOVENOX) 40 MG/0.4ML injection Inject 0.4 mLs (40 mg total) into the  skin daily for 14 doses. 09/16/19 09/30/19  Reche Dixon, PA-C  metFORMIN (GLUCOPHAGE) 500 MG tablet Take 1 tablet (500 mg total) by mouth daily with breakfast. 09/18/19 09/17/20  Georgette Shell, MD  ondansetron (ZOFRAN) 4 MG tablet Take 1 tablet (4 mg total) by mouth every 6 (six) hours as needed for nausea. 09/16/19   Reche Dixon, PA-C  oxyCODONE (OXY IR/ROXICODONE) 5 MG immediate release tablet Take 1 tablet (5 mg total) by mouth every 4 (four) hours as needed for severe pain (pain score 7-10). 09/16/19   Reche Dixon, PA-C  senna-docusate (SENOKOT-S) 8.6-50 MG tablet Take 1 tablet by mouth at bedtime as needed for mild constipation. 09/18/19    Georgette Shell, MD  traMADol (ULTRAM) 50 MG tablet Take 1 tablet (50 mg total) by mouth every 6 (six) hours as needed for moderate pain. 09/16/19   Reche Dixon, PA-C    Physical Exam: Vitals:   12/30/20 1439 12/30/20 2018 12/30/20 2135 12/30/20 2216  BP: (!) 160/82 106/62 (!) 94/54 (!) 110/93  Pulse: 90 100 99 98  Resp: 17 18 18 18   Temp: 98.5 F (36.9 C)     TempSrc: Oral     SpO2: 95% 97% 97% 100%  Weight:      Height:       Physical Exam Vitals and nursing note reviewed.  Constitutional:      General: He is not in acute distress.    Appearance: Normal appearance. He is not ill-appearing, toxic-appearing or diaphoretic.  HENT:     Head: Normocephalic and atraumatic.     Right Ear: External ear normal.     Left Ear: External ear normal.     Nose: Nose normal.     Mouth/Throat:     Mouth: Mucous membranes are moist.  Eyes:     Extraocular Movements: Extraocular movements intact.     Pupils: Pupils are equal, round, and reactive to light.  Cardiovascular:     Rate and Rhythm: Normal rate and regular rhythm.     Pulses: Normal pulses.     Heart sounds: Normal heart sounds.  Pulmonary:     Effort: Pulmonary effort is normal.     Breath sounds: Normal breath sounds.  Abdominal:     General: Bowel sounds are normal. There is no distension.     Palpations: Abdomen is soft. There is no mass.     Tenderness: There is no abdominal tenderness. There is no guarding.  Musculoskeletal:        General: No deformity.     Left hip: Tenderness present. No deformity. Decreased range of motion.     Right lower leg: No edema.     Left lower leg: No edema.       Legs:  Neurological:     General: No focal deficit present.     Mental Status: He is alert and oriented to person, place, and time.     Motor: Weakness present.  Psychiatric:        Mood and Affect: Mood normal.        Behavior: Behavior normal.     Labs on Admission: I have personally reviewed following labs and  imaging studies  No results for input(s): CKTOTAL, CKMB, TROPONINI in the last 72 hours. Lab Results  Component Value Date   WBC 5.5 12/30/2020   HGB 8.9 (L) 12/30/2020   HCT 27.0 (L) 12/30/2020   MCV 94.4 12/30/2020   PLT 160 12/30/2020    Recent Labs  Lab 12/30/20 1443  NA 136  K 4.6  CL 103  CO2 26  BUN 25*  CREATININE 1.15  CALCIUM 8.9  GLUCOSE 211*   No results found for: CHOL, HDL, LDLCALC, TRIG No results found for: DDIMER Invalid input(s): POCBNP   COVID-19 Labs No results for input(s): DDIMER, FERRITIN, LDH, CRP in the last 72 hours. Lab Results  Component Value Date   [place] (A) 12/30/2020   SARSCOV2NAA NEGATIVE 09/17/2019   [place] NEGATIVE 09/14/2019    Radiological Exams on Admission: DG Pelvis 1-2 Views  Result Date: 12/30/2020 CLINICAL DATA:  Trauma, fall, pain EXAM: PELVIS - 1-2 VIEW COMPARISON:  09/15/2019 FINDINGS: No recent displaced fracture is seen. Osteopenia is seen in bony structures. There is previous right hip arthroplasty. There is previous internal fixation of intertrochanteric fracture of left femur with intramedullary rod. Extensive arterial calcifications are seen. Degenerative changes are noted in the visualized lower lumbar spine. IMPRESSION: No recent fracture is seen. Other findings as described in the body of the report. Electronically Signed   By: Elmer Picker M.D.   On: 12/30/2020 16:40   DG FEMUR MIN 2 VIEWS LEFT  Result Date: 12/30/2020 CLINICAL DATA:  Trauma, pain EXAM: LEFT FEMUR 2 VIEWS COMPARISON:  09/14/2019 FINDINGS: There is spiral fracture in the mid to distal shaft of left femur. There is distraction of the fracture fragments with posterior displacement of distal major fracture fragment. There is previous internal fixation of intertrochanteric fracture of left femur. There is previous left knee arthroplasty. Extensive arterial calcifications are seen in the soft tissues. IMPRESSION: There is  displaced oblique/spiral fracture in the mid and distal shaft of left femur. Other findings as described in the body of the report. Electronically Signed   By: Elmer Picker M.D.   On: 12/30/2020 16:43    EKG: Independently reviewed.  None - pending.    Assessment/Plan: Patient is a 80 year old Caucasian coming to [place] for fall sustaining a fracture to the left femur chart review shows that patient has been falling in the past as well.  Today however he describes a mechanical fall but after surgery is improved and to evaluate with physical therapy if patient is weak or deconditioned or neuropathic. 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  Plan is for procedure tomorrow.  Immobilization, prn pain control, ivf and po after midnight.  Pt is currently medically stable and low to moderate risk for any cardiopulmonary complication.  Pt will need IS post op to prevent atelectasis and pneumonia.  Along with aggressive rehab.  Aspiration and fall precautions, GI and DVT prophylaxis.  Htn: Blood pressure (!) 110/93, pulse 98, temperature 98.5 F (36.9 C), temperature source Oral, resp. rate 18, height 5\' 7"  (1.702 m), weight 58.5 kg, SpO2 100 %. Bp is low, we will start prn hydralazine and hold home regimen with amlodipine,.   Alcohol dependence: Resolved.  Aspiration and fall precautions.  DM II: Ssi q 6 hour and glycemic protocol. Hold patient's metformin.  Chronic kidney disease stage III: Lab Results  Component Value Date   CREATININE 1.15 12/30/2020   CREATININE  1.17 09/18/2019   CREATININE  1.41 (H) 09/17/2019  Resolved/stable. Avoid contrast studies unless absolutely necessary take appropriate precautions for prevention of contrast-induced nephropathy.  Renally dose all needed meds.  GERD: IV PPI therapy.  Tobacco abuse: Nicotine patch.    DVT prophylaxis:  SCD's   Code Status:  Full code    Family Communication:  Jones,Gary (Friend)  [phone] (Mobile)   Disposition Plan:  Home    Consults called:  Orthopedic-Dr.Menz.   Admission status: Inpatient.     Para Skeans MD [facility] [phone] How to contact the [facility] Attending or Consulting provider [place] or covering provider during after hours [place], for this patient.    Check the care team in [facility] and look for a) attending/consulting [facility] provider listed and b) the [facility] team listed Log into [email or web address] and use [place]'s universal password to access. If you do not have the password, please contact the hospital operator. Locate the [facility] provider you are looking for under [facility] and page to a number that you can be directly reached. If you still have difficulty reaching the provider, please page the [facility] (Director on Call) for the Hospitalists listed on amion for assistance. [email or web address] Password [facility] 12/30/2020, 10:21 PM

## 2020-12-31 ENCOUNTER — Encounter: Payer: Self-pay | Admitting: Internal Medicine

## 2020-12-31 ENCOUNTER — Inpatient Hospital Stay: Payer: No Typology Code available for payment source

## 2020-12-31 ENCOUNTER — Inpatient Hospital Stay: Payer: No Typology Code available for payment source | Admitting: Anesthesiology

## 2020-12-31 ENCOUNTER — Encounter
Admission: EM | Disposition: A | Discharge status: Home Health | Payer: Self-pay | Source: Home / Self Care | Attending: Internal Medicine

## 2020-12-31 DIAGNOSIS — Z720 Tobacco use: Secondary | ICD-10-CM

## 2020-12-31 DIAGNOSIS — U071 COVID-19: Secondary | ICD-10-CM

## 2020-12-31 HISTORY — PX: ORIF FEMUR FRACTURE: SHX2119

## 2020-12-31 LAB — SURGICAL PCR SCREEN
MRSA, PCR: DETECTED — AB
Staphylococcus aureus: DETECTED — AB

## 2020-12-31 LAB — RESP PANEL BY RT-PCR (FLU A&B, COVID) ARPGX2
Influenza A by PCR: NEGATIVE
Influenza B by PCR: NEGATIVE
SARS Coronavirus 2 by RT PCR: POSITIVE — AB

## 2020-12-31 LAB — GLUCOSE, CAPILLARY: Glucose-Capillary: 183 mg/dL — ABNORMAL HIGH (ref 70–99)

## 2020-12-31 IMAGING — RF DG FEMUR 2+V*L*
1 series · 6 of 6 positions shown · non-contrast
Comparison: [DATE]

CLINICAL DATA: Known left femoral fracture

EXAM:
LEFT FEMUR 2 VIEWS; DG C-ARM 1-60 MIN

[Series 1: run · 6 of 6 slices shown]
[im 1/6]
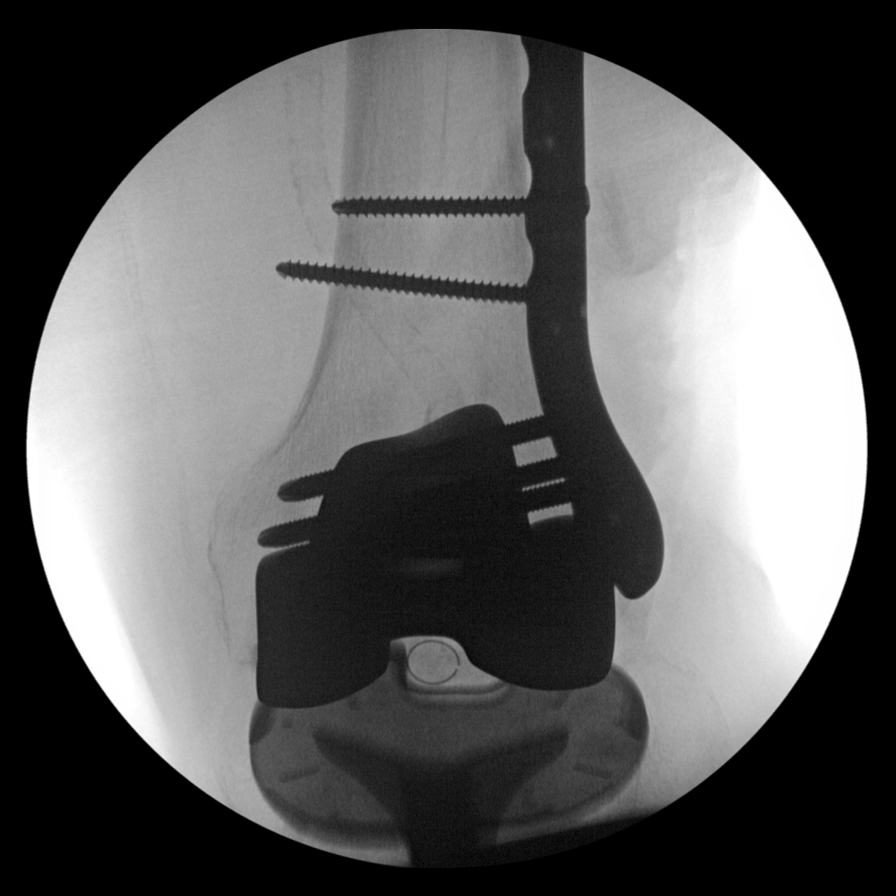
[im 2/6]
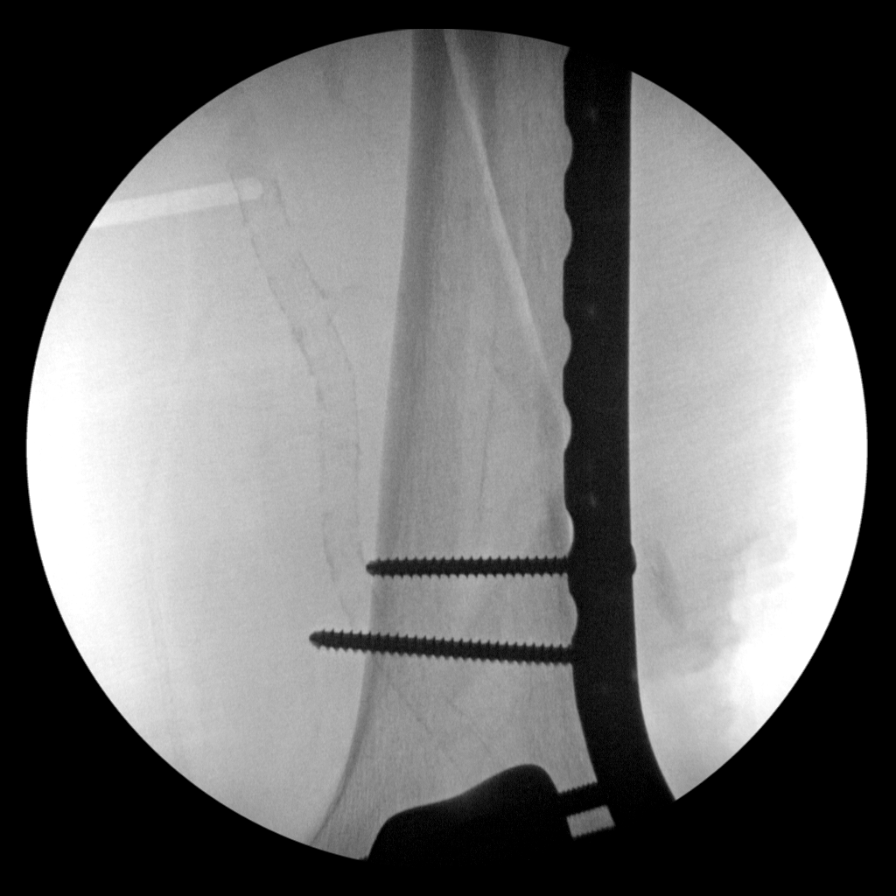
[im 3/6]
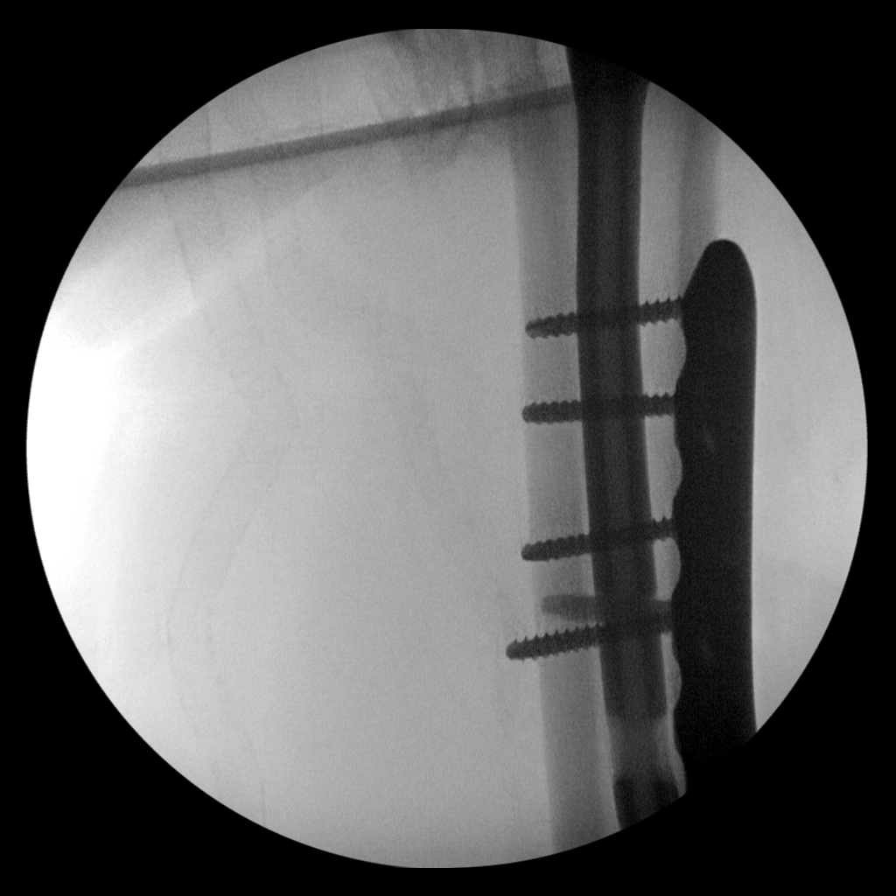
[im 4/6]
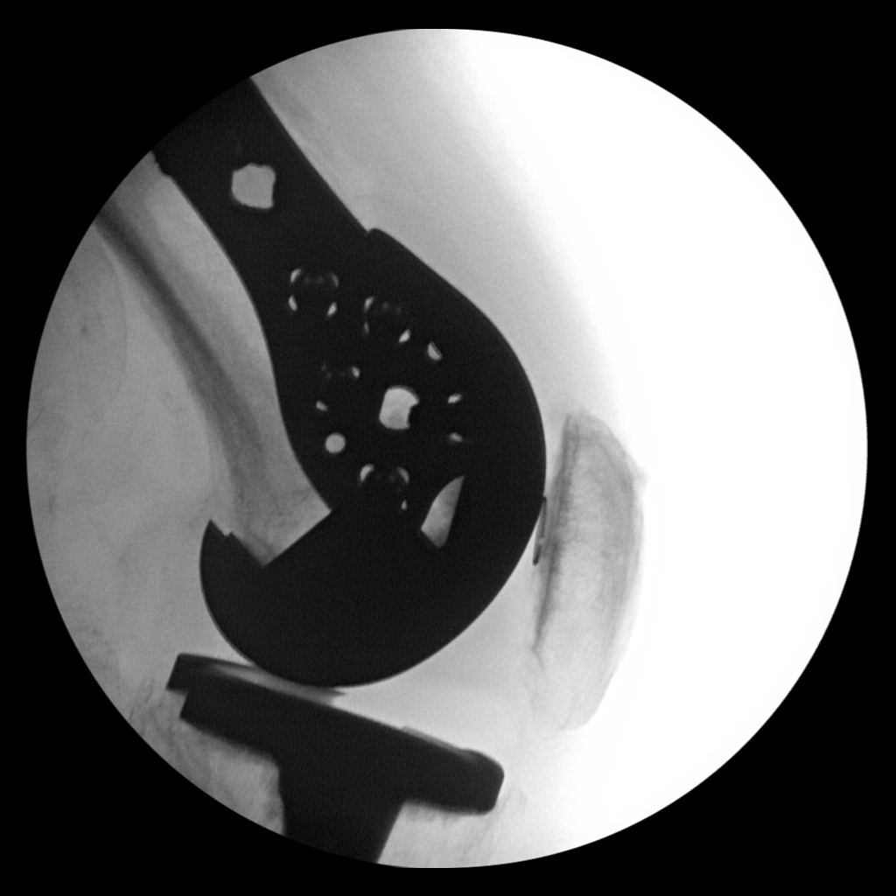
[im 5/6]
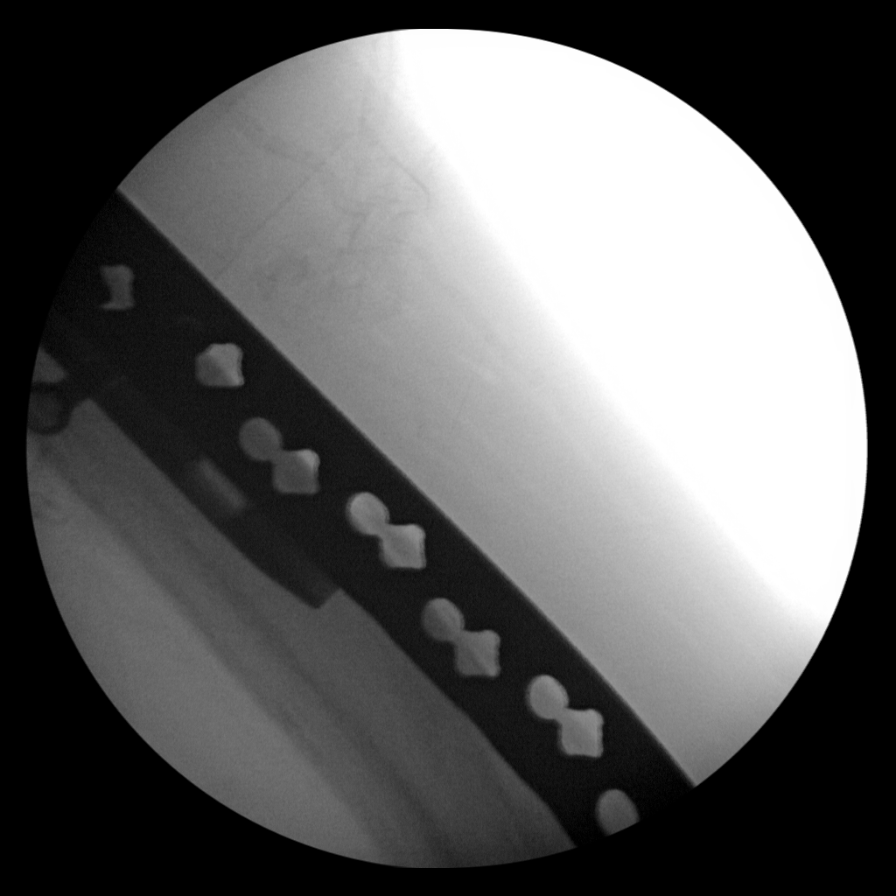
[im 6/6]
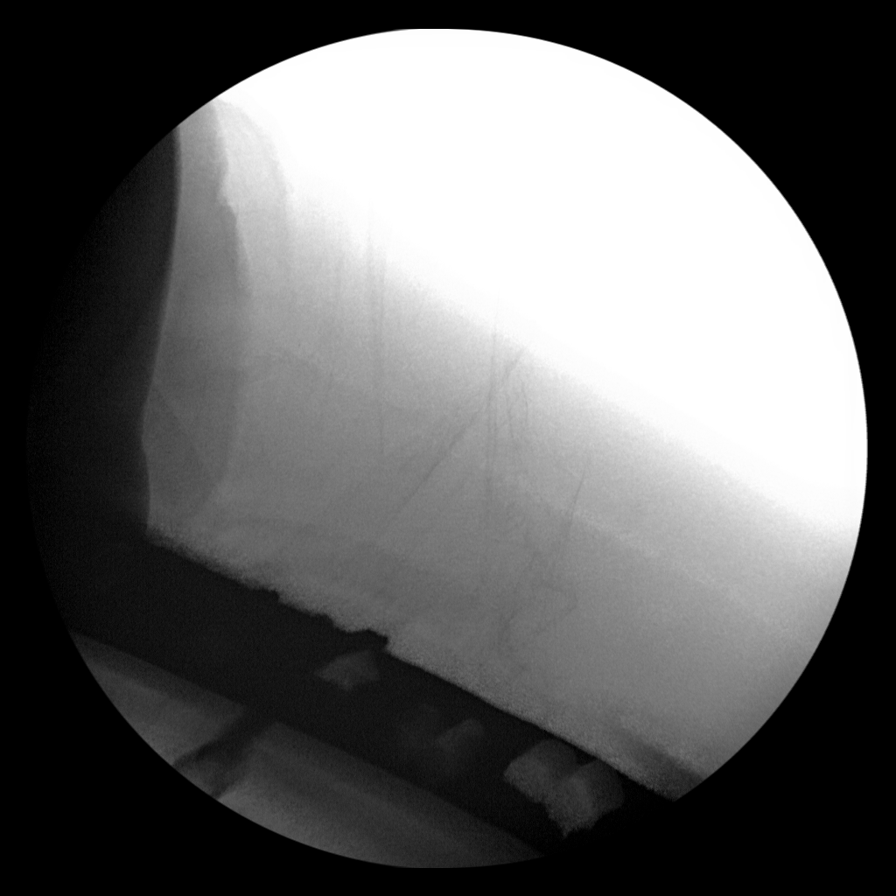

[6 of 6 positions shown; findings below may reference images not displayed]

FLUOROSCOPY TIME:  Radiation Exposure Index (as provided by the
fluoroscopic device): Not available

If the device does not provide the exposure index:

Fluoroscopy Time:  2 minutes 5 seconds

Number of Acquired Images:  6
FINDINGS: Multiple spot films were obtained which reveal a E lateral fixation
sideplate along the left femur. Multiple proximal and distal
fixation screws are noted with near anatomic alignment of the left
femoral fracture. Prior left knee prosthesis and proximal fixation
of the femur on the left are seen.
IMPRESSION: ORIF of left femoral fracture.

## 2020-12-31 IMAGING — RF DG C-ARM 1-60 MIN
1 series · 6 of 6 positions shown · non-contrast
Comparison: [DATE]

CLINICAL DATA: Known left femoral fracture

EXAM:
LEFT FEMUR 2 VIEWS; DG C-ARM 1-60 MIN

[Series 1: run · 6 of 6 slices shown]
[im 1/6]
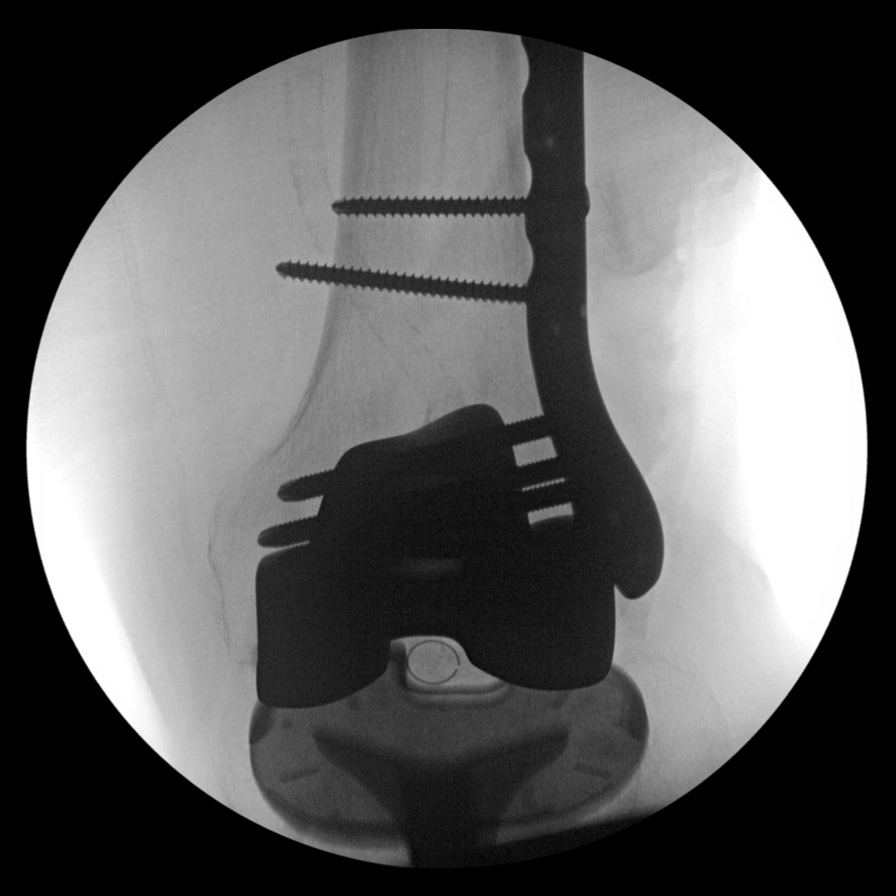
[im 2/6]
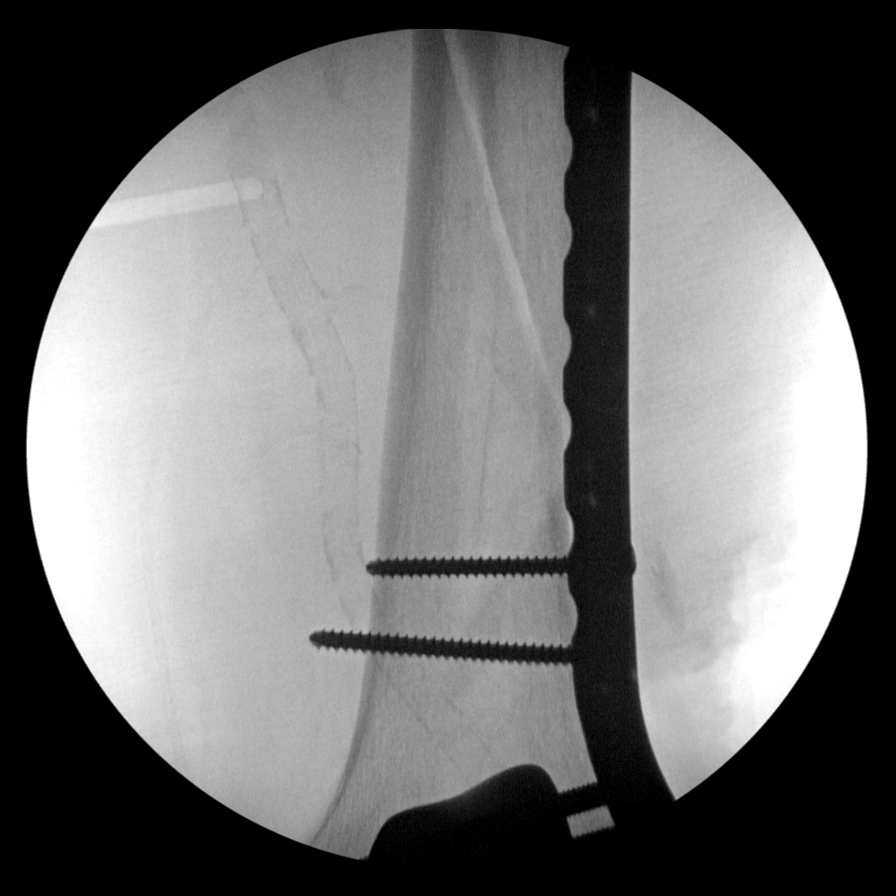
[im 3/6]
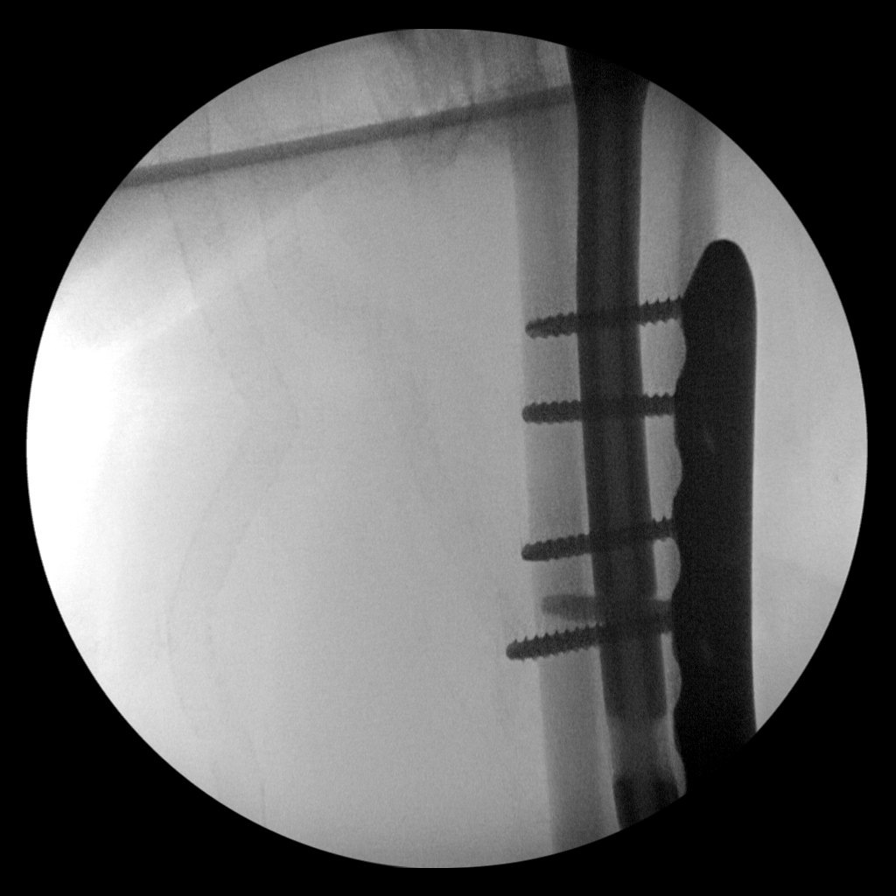
[im 4/6]
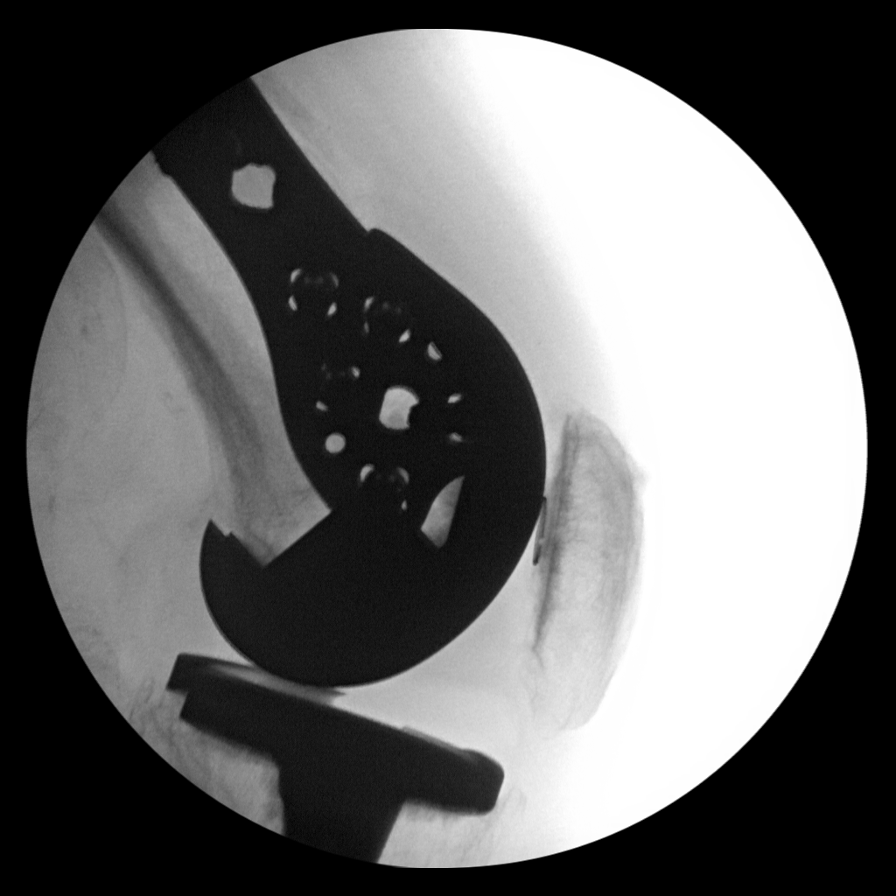
[im 5/6]
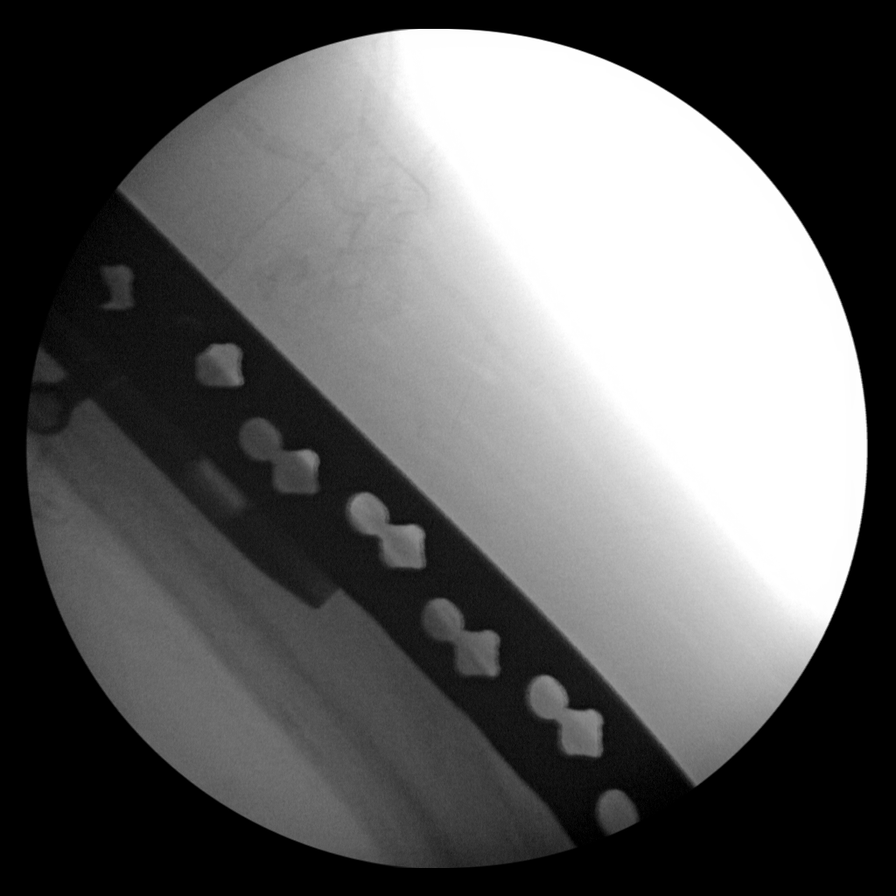
[im 6/6]
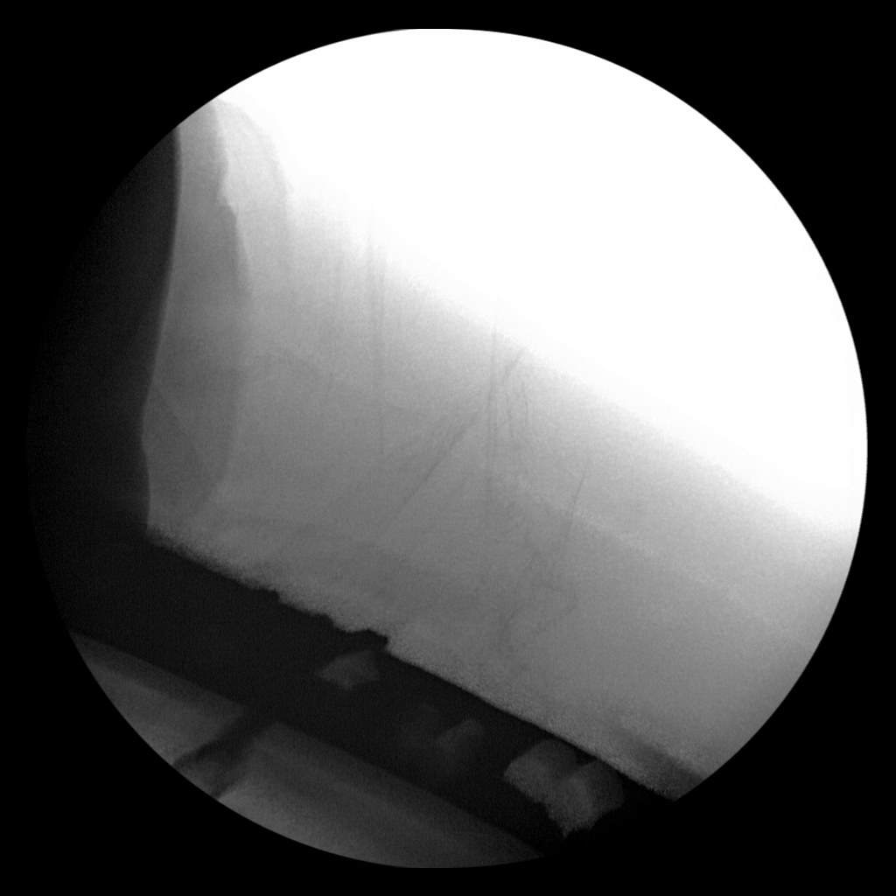

[6 of 6 positions shown; findings below may reference images not displayed]

FLUOROSCOPY TIME:  Radiation Exposure Index (as provided by the
fluoroscopic device): Not available

If the device does not provide the exposure index:

Fluoroscopy Time:  2 minutes 5 seconds

Number of Acquired Images:  6
FINDINGS: Multiple spot films were obtained which reveal a E lateral fixation
sideplate along the left femur. Multiple proximal and distal
fixation screws are noted with near anatomic alignment of the left
femoral fracture. Prior left knee prosthesis and proximal fixation
of the femur on the left are seen.
IMPRESSION: ORIF of left femoral fracture.

## 2020-12-31 SURGERY — OPEN REDUCTION INTERNAL FIXATION (ORIF) DISTAL FEMUR FRACTURE
Anesthesia: General | Site: Leg Upper | Laterality: Left

## 2020-12-31 MED ORDER — ONDANSETRON HCL 4 MG/2ML IJ SOLN: 4.0000 mg | Freq: Four times a day (QID) | INTRAMUSCULAR | Status: DC | PRN

## 2020-12-31 MED ORDER — CEFAZOLIN (ANCEF) 1 G IV SOLR
1.0000 g | INTRAVENOUS | Status: AC
  Filled 2020-12-31: qty 1

## 2020-12-31 MED ORDER — CEFAZOLIN (ANCEF) 1 G IV SOLR: 1.0000 g | INTRAVENOUS | Status: DC

## 2020-12-31 MED ORDER — LACTATED RINGERS IV SOLN: INTRAVENOUS | Status: DC | PRN

## 2020-12-31 MED ORDER — PANTOPRAZOLE SODIUM 40 MG PO TBEC: 80.0000 mg | DELAYED_RELEASE_TABLET | Freq: Every day | ORAL | Status: DC

## 2020-12-31 MED ORDER — HYDROCODONE-ACETAMINOPHEN 7.5-325 MG PO TABS
1.0000 | ORAL_TABLET | ORAL | Status: DC | PRN
  Administered 2021-01-01: 14:00:00 1 via ORAL
  Filled 2020-12-31: qty 1

## 2020-12-31 MED ORDER — METFORMIN HCL 500 MG PO TABS
500.0000 mg | ORAL_TABLET | Freq: Every day | ORAL | Status: DC
  Administered 2021-01-01 – 2021-01-05 (×5): 500 mg via ORAL
  Filled 2020-12-31 (×5): qty 1

## 2020-12-31 MED ORDER — ADULT MULTIVITAMIN W/MINERALS CH
1.0000 | ORAL_TABLET | Freq: Every day | ORAL | Status: DC
  Administered 2021-01-01 – 2021-01-05 (×5): 1 via ORAL
  Filled 2020-12-31 (×5): qty 1

## 2020-12-31 MED ORDER — MENTHOL 3 MG MT LOZG
1.0000 | LOZENGE | OROMUCOSAL | Status: DC | PRN
  Filled 2020-12-31: qty 9

## 2020-12-31 MED ORDER — PHENOL 1.4 % MT LIQD
1.0000 | OROMUCOSAL | Status: DC | PRN
  Filled 2020-12-31: qty 177

## 2020-12-31 MED ORDER — MULTIVITAMIN ADULT (MINERALS) PO TABS: 1.0000 | ORAL_TABLET | Freq: Every day | ORAL | Status: DC

## 2020-12-31 MED ORDER — NEOMYCIN-POLYMYXIN B GU 40-200000 IR SOLN
Status: AC
  Filled 2020-12-31: qty 2

## 2020-12-31 MED ORDER — FENTANYL CITRATE (PF) 100 MCG/2ML IJ SOLN
INTRAMUSCULAR | Status: AC
  Filled 2020-12-31: qty 2

## 2020-12-31 MED ORDER — ZOLPIDEM TARTRATE 5 MG PO TABS
5.0000 mg | ORAL_TABLET | Freq: Every evening | ORAL | Status: DC | PRN
  Administered 2021-01-05: 5 mg via ORAL
  Filled 2020-12-31 (×2): qty 1

## 2020-12-31 MED ORDER — MAGNESIUM HYDROXIDE 400 MG/5ML PO SUSP: 30.0000 mL | Freq: Every day | ORAL | Status: DC | PRN

## 2020-12-31 MED ORDER — ONDANSETRON HCL 4 MG PO TABS: 4.0000 mg | ORAL_TABLET | Freq: Four times a day (QID) | ORAL | Status: DC | PRN

## 2020-12-31 MED ORDER — TAMSULOSIN HCL 0.4 MG PO CAPS
0.4000 mg | ORAL_CAPSULE | Freq: Every day | ORAL | Status: DC
  Administered 2021-01-01 – 2021-01-05 (×5): 0.4 mg via ORAL
  Filled 2020-12-31 (×5): qty 1

## 2020-12-31 MED ORDER — SODIUM CHLORIDE 0.9 % IV SOLN: INTRAVENOUS | Status: DC

## 2020-12-31 MED ORDER — METHOCARBAMOL 1000 MG/10ML IJ SOLN
500.0000 mg | Freq: Four times a day (QID) | INTRAVENOUS | Status: DC | PRN
  Filled 2020-12-31: qty 5

## 2020-12-31 MED ORDER — ONDANSETRON HCL 4 MG/2ML IJ SOLN
INTRAMUSCULAR | Status: DC | PRN
  Administered 2020-12-31: 4 mg via INTRAVENOUS

## 2020-12-31 MED ORDER — ALUM & MAG HYDROXIDE-SIMETH 200-200-20 MG/5ML PO SUSP: 30.0000 mL | ORAL | Status: DC | PRN

## 2020-12-31 MED ORDER — BISACODYL 10 MG RE SUPP: 10.0000 mg | Freq: Every day | RECTAL | Status: DC | PRN

## 2020-12-31 MED ORDER — DOCUSATE SODIUM 100 MG PO CAPS
100.0000 mg | ORAL_CAPSULE | Freq: Two times a day (BID) | ORAL | Status: DC
  Administered 2020-12-31 – 2021-01-05 (×9): 100 mg via ORAL
  Filled 2020-12-31 (×10): qty 1

## 2020-12-31 MED ORDER — LIDOCAINE HCL (CARDIAC) PF 100 MG/5ML IV SOSY
PREFILLED_SYRINGE | INTRAVENOUS | Status: DC | PRN
  Administered 2020-12-31: 60 mg via INTRAVENOUS

## 2020-12-31 MED ORDER — METOCLOPRAMIDE HCL 10 MG PO TABS: 5.0000 mg | ORAL_TABLET | Freq: Three times a day (TID) | ORAL | Status: DC | PRN

## 2020-12-31 MED ORDER — PANTOPRAZOLE SODIUM 40 MG PO TBEC
40.0000 mg | DELAYED_RELEASE_TABLET | Freq: Every day | ORAL | Status: DC
  Administered 2021-01-01 – 2021-01-05 (×5): 40 mg via ORAL
  Filled 2020-12-31 (×5): qty 1

## 2020-12-31 MED ORDER — PHENYLEPHRINE HCL (PRESSORS) 10 MG/ML IV SOLN
INTRAVENOUS | Status: DC | PRN
  Administered 2020-12-31 (×5): 100 ug via INTRAVENOUS

## 2020-12-31 MED ORDER — FENTANYL CITRATE (PF) 100 MCG/2ML IJ SOLN
INTRAMUSCULAR | Status: DC | PRN
  Administered 2020-12-31 (×3): 50 ug via INTRAVENOUS

## 2020-12-31 MED ORDER — SODIUM CHLORIDE 0.9 % IR SOLN
Status: DC | PRN
  Administered 2020-12-31: 200 mL

## 2020-12-31 MED ORDER — METOCLOPRAMIDE HCL 5 MG/ML IJ SOLN: 5.0000 mg | Freq: Three times a day (TID) | INTRAMUSCULAR | Status: DC | PRN

## 2020-12-31 MED ORDER — SENNOSIDES-DOCUSATE SODIUM 8.6-50 MG PO TABS: 1.0000 | ORAL_TABLET | Freq: Every evening | ORAL | Status: DC | PRN

## 2020-12-31 MED ORDER — APIXABAN 5 MG PO TABS
5.0000 mg | ORAL_TABLET | Freq: Two times a day (BID) | ORAL | Status: DC
  Administered 2021-01-01 – 2021-01-02 (×3): 5 mg via ORAL
  Filled 2020-12-31 (×3): qty 1

## 2020-12-31 MED ORDER — GLYCOPYRROLATE 0.2 MG/ML IJ SOLN
INTRAMUSCULAR | Status: DC | PRN
  Administered 2020-12-31: .2 mg via INTRAVENOUS

## 2020-12-31 MED ORDER — CEFAZOLIN SODIUM-DEXTROSE 2-4 GM/100ML-% IV SOLN
2.0000 g | Freq: Four times a day (QID) | INTRAVENOUS | Status: AC
  Administered 2020-12-31 – 2021-01-01 (×3): 2 g via INTRAVENOUS
  Filled 2020-12-31 (×3): qty 100

## 2020-12-31 MED ORDER — CEFAZOLIN SODIUM-DEXTROSE 2-3 GM-%(50ML) IV SOLR
INTRAVENOUS | Status: DC | PRN
  Administered 2020-12-31: 2 g via INTRAVENOUS

## 2020-12-31 MED ORDER — METHOCARBAMOL 500 MG PO TABS
500.0000 mg | ORAL_TABLET | Freq: Four times a day (QID) | ORAL | Status: DC | PRN
  Administered 2021-01-03 – 2021-01-04 (×3): 500 mg via ORAL
  Filled 2020-12-31 (×3): qty 1

## 2020-12-31 MED ORDER — GLUCERNA SHAKE PO LIQD
237.0000 mL | Freq: Two times a day (BID) | ORAL | Status: DC
  Administered 2021-01-01 – 2021-01-04 (×8): 237 mL via ORAL

## 2020-12-31 MED ORDER — INSULIN ASPART 100 UNIT/ML IJ SOLN
0.0000 [IU] | Freq: Three times a day (TID) | INTRAMUSCULAR | Status: DC
  Administered 2021-01-01 (×2): 3 [IU] via SUBCUTANEOUS
  Administered 2021-01-01: 09:00:00 2 [IU] via SUBCUTANEOUS
  Administered 2021-01-02 (×3): 3 [IU] via SUBCUTANEOUS
  Administered 2021-01-03: 2 [IU] via SUBCUTANEOUS
  Administered 2021-01-03 – 2021-01-05 (×4): 3 [IU] via SUBCUTANEOUS
  Filled 2020-12-31 (×12): qty 1

## 2020-12-31 MED ORDER — TRAMADOL HCL 50 MG PO TABS
50.0000 mg | ORAL_TABLET | Freq: Four times a day (QID) | ORAL | Status: DC
  Administered 2020-12-31 – 2021-01-05 (×16): 50 mg via ORAL
  Filled 2020-12-31 (×16): qty 1

## 2020-12-31 MED ORDER — PROPOFOL 10 MG/ML IV BOLUS
INTRAVENOUS | Status: DC | PRN
  Administered 2020-12-31: 100 mg via INTRAVENOUS

## 2020-12-31 MED ORDER — DULOXETINE HCL 20 MG PO CPEP
20.0000 mg | ORAL_CAPSULE | Freq: Every day | ORAL | Status: DC
  Administered 2021-01-01 – 2021-01-05 (×5): 20 mg via ORAL
  Filled 2020-12-31 (×5): qty 1

## 2020-12-31 MED ORDER — TRANEXAMIC ACID-NACL 1000-0.7 MG/100ML-% IV SOLN
1000.0000 mg | Freq: Once | INTRAVENOUS | Status: AC
  Administered 2020-12-31: 1000 mg via INTRAVENOUS
  Filled 2020-12-31: qty 100

## 2020-12-31 MED ORDER — SUCCINYLCHOLINE CHLORIDE 200 MG/10ML IV SOSY
PREFILLED_SYRINGE | INTRAVENOUS | Status: DC | PRN
  Administered 2020-12-31: 60 mg via INTRAVENOUS

## 2020-12-31 MED ORDER — ATORVASTATIN CALCIUM 20 MG PO TABS
20.0000 mg | ORAL_TABLET | Freq: Every day | ORAL | Status: DC
  Administered 2020-12-31 – 2021-01-04 (×5): 20 mg via ORAL
  Filled 2020-12-31 (×5): qty 1

## 2020-12-31 MED ORDER — PHENYLEPHRINE HCL-NACL 20-0.9 MG/250ML-% IV SOLN
INTRAVENOUS | Status: DC | PRN
  Administered 2020-12-31: 20 ug/min via INTRAVENOUS

## 2020-12-31 MED ORDER — ENSURE MAX PROTEIN PO LIQD
11.0000 [oz_av] | Freq: Every day | ORAL | Status: DC
Start: 2021-01-01 — End: 2021-01-05
  Administered 2021-01-01 – 2021-01-04 (×3): 11 [oz_av] via ORAL
  Filled 2020-12-31: qty 330

## 2020-12-31 SURGICAL SUPPLY — 48 items
BIT DRILL CANN QC 4.3X180 (BIT) ×1 IMPLANT
BIT DRILL GUIDEWIRE 2.5X200 (WIRE) ×2 IMPLANT
BIT DRILL Q/COUPLING 1 (BIT) ×2 IMPLANT
CHLORAPREP W/TINT 26 (MISCELLANEOUS) ×2 IMPLANT
DRAPE C-ARM XRAY 36X54 (DRAPES) ×2 IMPLANT
DRAPE C-ARMOR (DRAPES) ×2 IMPLANT
DRAPE INCISE IOBAN 66X45 STRL (DRAPES) ×2 IMPLANT
DRILL BIT 4.3MM (BIT) ×2
DRSG VERSA FOAM LRG 10X15 (GAUZE/BANDAGES/DRESSINGS) ×2 IMPLANT
ELECT REM PT RETURN 9FT ADLT (ELECTROSURGICAL) ×2
ELECTRODE REM PT RTRN 9FT ADLT (ELECTROSURGICAL) ×1 IMPLANT
GAUZE SPONGE 4X4 12PLY STRL (GAUZE/BANDAGES/DRESSINGS) ×2 IMPLANT
GAUZE XEROFORM 1X8 LF (GAUZE/BANDAGES/DRESSINGS) ×4 IMPLANT
GLOVE SURG SYN 9.0  PF PI (GLOVE) ×1
GLOVE SURG SYN 9.0 PF PI (GLOVE) ×1 IMPLANT
GLOVE SURG UNDER POLY LF SZ9 (GLOVE) ×2 IMPLANT
GOWN SRG 2XL LVL 4 RGLN SLV (GOWNS) ×1 IMPLANT
GOWN STRL NON-REIN 2XL LVL4 (GOWNS) ×1
GOWN STRL REUS W/ TWL LRG LVL3 (GOWN DISPOSABLE) ×1 IMPLANT
GOWN STRL REUS W/TWL LRG LVL3 (GOWN DISPOSABLE) ×1
HANDLE YANKAUER SUCT BULB TIP (MISCELLANEOUS) ×2 IMPLANT
KIT TURNOVER KIT A (KITS) ×2 IMPLANT
MANIFOLD NEPTUNE II (INSTRUMENTS) ×2 IMPLANT
MAT ABSORB  FLUID 56X50 GRAY (MISCELLANEOUS) ×1
MAT ABSORB FLUID 56X50 GRAY (MISCELLANEOUS) ×1 IMPLANT
NEEDLE FILTER BLUNT 18X 1/2SAF (NEEDLE) ×1
NEEDLE FILTER BLUNT 18X1 1/2 (NEEDLE) ×1 IMPLANT
NS IRRIG 500ML POUR BTL (IV SOLUTION) ×2 IMPLANT
PACK HIP PROSTHESIS (MISCELLANEOUS) ×2 IMPLANT
PAD ABD DERMACEA PRESS 5X9 (GAUZE/BANDAGES/DRESSINGS) ×4 IMPLANT
PLATE 14H LEFT 4.5LCP VA (Plate) ×2 IMPLANT
SCALPEL PROTECTED #10 DISP (BLADE) ×4 IMPLANT
SCREW CORT ST 4.5X50 (Screw) ×2 IMPLANT
SCREW CORTEX ST 4.5X36 (Screw) ×6 IMPLANT
SCREW CORTEX ST 4.5X38 (Screw) ×2 IMPLANT
SCREW CORTEX ST 4.5X60 (Screw) ×4 IMPLANT
SCREW LOCK VA 5.0X30 (Screw) ×2 IMPLANT
SCREW LOCKING VA 5.0X70MM (Screw) ×4 IMPLANT
SCREW VA LOCKING 5.0X50MM (Screw) ×2 IMPLANT
SCREW VA LOCKING 5.0X65 (Screw) ×2 IMPLANT
SPONGE T-LAP 18X18 ~~LOC~~+RFID (SPONGE) ×4 IMPLANT
STAPLER SKIN PROX 35W (STAPLE) ×2 IMPLANT
SUT VIC AB 0 CT1 36 (SUTURE) ×4 IMPLANT
SUT VIC AB 2-0 CT1 27 (SUTURE) ×2
SUT VIC AB 2-0 CT1 TAPERPNT 27 (SUTURE) ×2 IMPLANT
SYR 5ML LL (SYRINGE) ×2 IMPLANT
TAPE MICROFOAM 4IN (TAPE) ×2 IMPLANT
WATER STERILE IRR 500ML POUR (IV SOLUTION) ×2 IMPLANT

## 2020-12-31 NOTE — Anesthesia Preprocedure Evaluation (Addendum)
Anesthesia Evaluation  Patient identified by MRN, date of birth, ID band Patient awake    Reviewed: Allergy & Precautions, NPO status , Patient's Chart, lab work & pertinent test results  History of Anesthesia Complications Negative for: history of anesthetic complications  Airway Mallampati: III  TM Distance: >3 FB Neck ROM: full    Dental  (+) Chipped, Poor Dentition, Missing, Caps   Pulmonary neg shortness of breath, Current Smoker,    Pulmonary exam normal        Cardiovascular Exercise Tolerance: Good hypertension, (-) anginaNormal cardiovascular exam     Neuro/Psych negative neurological ROS  negative psych ROS   GI/Hepatic GERD  ,(+) Cirrhosis       ,   Endo/Other  negative endocrine ROSdiabetes, Type 2  Renal/GU Renal disease     Musculoskeletal   Abdominal   Peds  Hematology negative hematology ROS (+)   Anesthesia Other Findings COVID + but asymptomatic   Past Medical History: No date: Diabetes mellitus without complication ([place]) No date: Hypertension  Past Surgical History: 09/15/2019: INTRAMEDULLARY (IM) NAIL INTERTROCHANTERIC; Left     Comment:  Procedure: INTRAMEDULLARY (IM) NAIL INTERTROCHANTRIC;                Surgeon: Leim Fabry, MD;  Location: [facility] ORS;  Service:              Orthopedics;  Laterality: Left; No date: JOINT REPLACEMENT     Comment:  bilateral knees No date: JOINT REPLACEMENT; Right     Comment:  hip  BMI    Body Mass Index: 20.20 kg/m      Reproductive/Obstetrics negative OB ROS                            Anesthesia Physical Anesthesia Plan  ASA: 3  Anesthesia Plan: General ETT   Post-op Pain Management:    Induction: Intravenous  PONV Risk Score and Plan: Ondansetron, Dexamethasone, Midazolam and Treatment may vary due to age or medical condition  Airway Management Planned: Oral ETT  Additional Equipment:   Intra-op Plan:    Post-operative Plan: Extubation in OR  Informed Consent: I have reviewed the patients History and Physical, chart, labs and discussed the procedure including the risks, benefits and alternatives for the proposed anesthesia with the patient or authorized representative who has indicated his/her understanding and acceptance.     Dental Advisory Given  Plan Discussed with: Anesthesiologist, CRNA and Surgeon  Anesthesia Plan Comments: (Patient consented for risks of anesthesia including but not limited to:  - adverse reactions to medications - damage to eyes, teeth, lips or other oral mucosa - nerve damage due to positioning  - sore throat or hoarseness - Damage to heart, brain, nerves, lungs, other parts of body or loss of life  Patient voiced understanding.)        Anesthesia Quick Evaluation

## 2020-12-31 NOTE — Transfer of Care (Signed)
Immediate Anesthesia Transfer of Care Note  Patient: Corey Gonzalez  Procedure(s) Performed: OPEN REDUCTION INTERNAL FIXATION (ORIF) DISTAL FEMUR FRACTURE - PERIPROSTHETIC FRACTURE (Left)  Patient Location: PACU and Nursing Unit  Anesthesia Type:General  Level of Consciousness: awake  Airway & Oxygen Therapy: Patient Spontanous Breathing  Post-op Assessment: Report given to RN  Post vital signs: stable  Last Vitals:  Vitals Value Taken Time  BP 121/71 12/31/20 1803  Temp 36.5 C 12/31/20 1803  Pulse 111 12/31/20 1803  Resp    SpO2 98 % 12/31/20 1803    Last Pain:  Vitals:   12/31/20 1803  TempSrc: Oral  PainSc:          Complications: No notable events documented.

## 2020-12-31 NOTE — Anesthesia Postprocedure Evaluation (Signed)
Anesthesia Post Note  Patient: BRYER COZZOLINO  Procedure(s) Performed: OPEN REDUCTION INTERNAL FIXATION (ORIF) DISTAL FEMUR FRACTURE - PERIPROSTHETIC FRACTURE (Left)  Patient location during evaluation: Nursing Unit Anesthesia Type: General Level of consciousness: awake and alert Pain management: pain level controlled Vital Signs Assessment: post-procedure vital signs reviewed and stable Respiratory status: spontaneous breathing, nonlabored ventilation, respiratory function stable and patient connected to nasal cannula oxygen Cardiovascular status: blood pressure returned to baseline and stable Postop Assessment: no apparent nausea or vomiting Anesthetic complications: no   No notable events documented.   Last Vitals:  Vitals:   12/31/20 1748 12/31/20 1803  BP:  121/71  Pulse: (!) 114 (!) 111  Resp:    Temp:  36.5 C  SpO2: 100% 98%    Last Pain:  Vitals:   12/31/20 1803  TempSrc: Oral  PainSc:                  Precious Haws Dail Meece

## 2020-12-31 NOTE — Consult Note (Signed)
Reason for Consult: Left periprosthetic femur fracture Referring Physician: Dr. Melina Schools is an 80 y.o. male.  HPI: Patient is a 80 year old who has had prior bilateral knee replacements right total hip replacement in about a year ago having left intertrochanteric hip fracture treated with short IM rod.  That is healed and he had been recently in rehab fell last night and fractured the left femur above the total knee.  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  No prodromal symptoms.  Past Medical History:  Diagnosis Date   Diabetes mellitus without complication ([place])    Hypertension     Past Surgical History:  Procedure Laterality Date   INTRAMEDULLARY (IM) NAIL INTERTROCHANTERIC Left 09/15/2019   Procedure: INTRAMEDULLARY (IM) NAIL INTERTROCHANTRIC;  Surgeon: Leim Fabry, MD;  Location: [facility] ORS;  Service: Orthopedics;  Laterality: Left;   JOINT REPLACEMENT     bilateral knees   JOINT REPLACEMENT Right    hip    Family History  Problem Relation Age of Onset   Diabetes Other     Social History:  reports that he has been smoking. He has never used smokeless tobacco. He reports current alcohol use of about 35.0 standard drinks per week. No history on file for drug use.  Allergies: No Known Allergies  Medications: I have reviewed the patient's current medications.  Results for orders placed or performed during the hospital encounter of 12/30/20 (from the past 48 hour(s))  CBC     Status: Abnormal   Collection Time: 12/30/20  2:43 PM  Result Value Ref Range   WBC 5.5 4.0 - 10.5 K/uL   RBC 2.86 (L) 4.22 - 5.81 MIL/uL   Hemoglobin 8.9 (L) 13.0 - 17.0 g/dL   HCT 27.0 (L) 39.0 - 52.0 %   MCV 94.4 80.0 - 100.0 fL   MCH 31.1 26.0 - 34.0 pg   MCHC 33.0 30.0 - 36.0 g/dL   RDW 16.6 (H) 11.5 - 15.5 %   Platelets 160 150 - 400 K/uL   nRBC 0.0 0.0 - 0.2 %    Comment: Performed at [facility], [address],  [place], [place] [address]  Basic metabolic panel     Status: Abnormal   Collection Time: 12/30/20  2:43 PM  Result Value Ref Range   Sodium 136 135 - 145 mmol/L   Potassium 4.6 3.5 - 5.1 mmol/L   Chloride 103 98 - 111 mmol/L   CO2 26 22 - 32 mmol/L   Glucose, Bld 211 (H) 70 - 99 mg/dL    Comment: Glucose reference range applies only to samples taken after fasting for at least 8 hours.   BUN 25 (H) 8 - 23 mg/dL   Creatinine, Ser 1.15 0.61 - 1.24 mg/dL   Calcium 8.9 8.9 - 10.3 mg/dL   GFR, Estimated >60 >60 mL/min    Comment: (NOTE) Calculated using the CKD-EPI Creatinine Equation (2021)    Anion gap 7 5 - 15    Comment: Performed at [facility], [address] [place], [place] [address]  Ethanol     Status: None   Collection Time: 12/30/20  2:43 PM  Result Value Ref Range   Alcohol, Ethyl (B) <10 <10 mg/dL    Comment: (NOTE) Lowest detectable limit for serum alcohol is 10 mg/dL.  For medical purposes only. Performed at [facility], [address], [place],  [address]   Type  and screen [place]     Status: None   Collection Time: 12/30/20  6:36 PM  Result Value Ref Range   ABO/RH(D) A POS    Antibody Screen NEG    Sample Expiration      01/02/2021,2359 Performed at [facility], [address] [place], [place] [address]   Resp Panel by RT-PCR (Flu A&B, Covid) Nasopharyngeal Swab     Status: Abnormal   Collection Time: 12/30/20  6:40 PM   Specimen: Nasopharyngeal Swab; Nasopharyngeal(NP) swabs in vial transport medium  Result Value Ref Range   SARS Coronavirus 2 by RT PCR POSITIVE (A) NEGATIVE    Comment: READ BACK AND VERIFIED BY Ladell Pier, RN AT 1940 12/30/20 BY [facility] (NOTE) SARS-CoV-2 target nucleic acids are DETECTED.  The SARS-CoV-2 RNA is generally detectable in upper respiratory specimens during the acute phase of infection. Positive results are indicative of the presence of the identified virus, but  do not rule out bacterial infection or co-infection with other pathogens not detected by the test. Clinical correlation with patient history and other diagnostic information is necessary to determine patient infection status. The expected result is Negative.  Fact Sheet for Patients: EntrepreneurPulse.com.au  Fact Sheet for Healthcare Providers: IncredibleEmployment.be  This test is not yet approved or cleared by the [place] FDA and  has been authorized for detection and/or diagnosis of SARS-CoV-2 by FDA under an Emergency Use Authorization (EUA).  This EUA will remain in effect (meaning this test can be used) for the  duration of  the COVID-19 declaration under Section 564(b)(1) of the Act, 21 U.S.C. section 360bbb-3(b)(1), unless the authorization is terminated or revoked sooner.     Influenza A by PCR NEGATIVE NEGATIVE   Influenza B by PCR NEGATIVE NEGATIVE    Comment: (NOTE) The Xpert Xpress SARS-CoV-2/FLU/RSV plus assay is intended as an aid in the diagnosis of influenza from Nasopharyngeal swab specimens and should not be used as a sole basis for treatment. Nasal washings and aspirates are unacceptable for Xpert Xpress SARS-CoV-2/FLU/RSV testing.  Fact Sheet for Patients: EntrepreneurPulse.com.au  Fact Sheet for Healthcare Providers: IncredibleEmployment.be  This test is not yet approved or cleared by the [place] FDA and has been authorized for detection and/or diagnosis of SARS-CoV-2 by FDA under an Emergency Use Authorization (EUA). This EUA will remain in effect (meaning this test can be used) for the duration of the COVID-19 declaration under Section 564(b)(1) of the Act, 21 U.S.C. section 360bbb-3(b)(1), unless the authorization is terminated or revoked.  Performed at [facility], [address] [place], [place] [address]     DG Pelvis 1-2 Views  Result Date:  12/30/2020 CLINICAL DATA:  Trauma, fall, pain EXAM: PELVIS - 1-2 VIEW COMPARISON:  09/15/2019 FINDINGS: No recent displaced fracture is seen. Osteopenia is seen in bony structures. There is previous right hip arthroplasty. There is previous internal fixation of intertrochanteric fracture of left femur with intramedullary rod. Extensive arterial calcifications are seen. Degenerative changes are noted in the visualized lower lumbar spine. IMPRESSION: No recent fracture is seen. Other findings as described in the body of the report. Electronically Signed   By: Elmer Picker M.D.   On: 12/30/2020 16:40   Chest Portable 1 View  Result Date: 12/30/2020 CLINICAL DATA:  Fall EXAM: PORTABLE CHEST 1 VIEW COMPARISON:  09/14/2019 FINDINGS: Heart and mediastinal contours are within normal limits. No focal opacities or effusions. No acute bony abnormality. Aortic atherosclerosis. IMPRESSION: No active disease. Electronically Signed  By: Rolm Baptise M.D.   On: 12/30/2020 22:47   DG FEMUR MIN 2 VIEWS LEFT  Result Date: 12/30/2020 CLINICAL DATA:  Trauma, pain EXAM: LEFT FEMUR 2 VIEWS COMPARISON:  09/14/2019 FINDINGS: There is spiral fracture in the mid to distal shaft of left femur. There is distraction of the fracture fragments with posterior displacement of distal major fracture fragment. There is previous internal fixation of intertrochanteric fracture of left femur. There is previous left knee arthroplasty. Extensive arterial calcifications are seen in the soft tissues. IMPRESSION: There is displaced oblique/spiral fracture in the mid and distal shaft of left femur. Other findings as described in the body of the report. Electronically Signed   By: Elmer Picker M.D.   On: 12/30/2020 16:43    Review of Systems Blood pressure 121/71, pulse 97, temperature 98.5 F (36.9 C), temperature source Oral, resp. rate 18, height 5\' 7"  (1.702 m), weight 58.5 kg, SpO2 100 %. Physical Exam Has left leg is  shortened and externally rotated with swelling about the knee and distal thigh that is mild to moderate.  No ecchymosis noted.  He has trace dorsalis pedis pulse and sensation is intact at the foot. Assessment/Plan: Spiral periprosthetic left distal femur fracture with prior left total knee and short IM rod and proximal femur Plans for open reduction internal fixation with removal of some of the hardware of the short IM rod to allow for plate application against the bone.  Risk benefits possible complications discussed and site marked.  Hessie Knows 12/31/2020, 6:51 AM

## 2020-12-31 NOTE — Plan of Care (Signed)
Notified [facility] that patient is MRSA positive.

## 2020-12-31 NOTE — Progress Notes (Signed)
Initial Nutrition Assessment  DOCUMENTATION CODES:   Not applicable  INTERVENTION:   -Once diet is advanced, add:   -MVI with minerals daily -Glucerna Shake po BID, each supplement provides 220 kcal and 10 grams of protein  -Ensure Max po daily, each supplement provides 150 kcal and 30 grams of protein  NUTRITION DIAGNOSIS:   Increased nutrient needs related to post-op healing as evidenced by estimated needs.  GOAL:   Patient will meet greater than or equal to 90% of their needs  MONITOR:   PO intake, Supplement acceptance, Diet advancement, Labs, Weight trends, Skin, I & O's  REASON FOR ASSESSMENT:   Consult Assessment of nutrition requirement/status, Hip fracture protocol  ASSESSMENT:   Corey Gonzalez is a 80 y.o. male seen in ed with complaints of fall and left hip pain.  Pt admitted with lt hip fx s/p fall.   Reviewed I/O's: -300 ml x 24 hours  UOP: 300 ml x 24 hours  Pt unavailable at time of visit. RD unable to obtain further nutrition-related history or complete nutrition-focused physical exam at this time.     Per orthopedics notes, plan for ORIF today. Pt currently NPO for procedure.   Reviewed wt hx; pt has experienced a 21.8% wt loss over the past 16 months. While this is not significant for time frame, it is concerning given pt's advanced age and multiple co-morbidities. Pt is at high risk for malnutrition, however, unable to identify at this time.   Pt would greatly benefit from addition of oral nutrition supplements.   Medications reviewed  Lab Results  Component Value Date   HGBA1C 7.5 (H) 09/15/2019   PTA DM medications are 500 mg metformin daily.   Labs reviewed: CBGS: 214-232 (inpatient orders for glycemic control are none).    Labs reviewed.   Diet Order:   Diet Order             Diet NPO time specified  Diet effective now                   EDUCATION NEEDS:   Not appropriate for education at this time  Skin:  Skin  Assessment: Skin Integrity Issues: Skin Integrity Issues:: Incisions Incisions: closed lt hip  Last BM:  Unknown  Height:   Ht Readings from Last 1 Encounters:  12/30/20 5\' 7"  (1.702 m)    Weight:   Wt Readings from Last 1 Encounters:  12/30/20 58.5 kg    Ideal Body Weight:  67.3 kg  BMI:  Body mass index is 20.2 kg/m.  Estimated Nutritional Needs:   Kcal:  1850-2050  Protein:  105-120 grams  Fluid:  > 1.8 L    Loistine Chance, RD, LDN, [place] Registered Dietitian II Certified Diabetes Care and Education Specialist Please refer to [facility] for RD and/or RD on-call/weekend/after hours pager

## 2020-12-31 NOTE — Progress Notes (Signed)
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  Also found to be incidentally COVID-positive.  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  Patient seen by orthopedic surgery with plans to go to the OR later today.  Assessment/Plan: Principal Problem:   Fall at home, initial encounter causing closed left hip fracture: Plan for surgery later today.  Aggressive rehab.    Diabetes mellitus without complication ([place]): N.p.o. and every 6 hours sliding scale.   Chronic kidney disease, stage 3a ([place]): At baseline.  Covid 19 infection: Vital signs stable.  Looks to be incidental infection.  Checking    Hypertension: Blood pressure stable.    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  Code Status: Full code  Family Communication: Left message for daughter  Disposition Plan: May end up needing skilled nursing   Consultants: Orthopedic surgery  Procedures: Planned ORIF  Antimicrobials: Preop Ancef  DVT prophylaxis: SCDs for now  Level of care: Telemetry Medical   Objective: Vitals:   12/31/20 0501 12/31/20 0609  BP: (!) 106/58 121/71  Pulse: 97 97  Resp: 18 18  Temp:    SpO2: 100% 100%    Intake/Output Summary (Last 24 hours) at 12/31/2020 1059 Last data filed at 12/31/2020 0643 Gross per 24 hour  Intake --  Output 300 ml  Net -300 ml   Filed Weights   12/30/20 1438  Weight: 58.5 kg   Body mass index is 20.2 kg/m.  Exam:  General:  Alert and oriented x3, no acute distress HEENT: Normocephalic and atraumatic, mucous membranes slightly dry Cardiovascular: Regular rate and rhythm, S1-S2 Respiratory: Clear to auscultation bilaterally Abdomen: Soft, nontender, nondistended, positive bowel sounds Musculoskeletal: No clubbing or cyanosis or edema Skin: No skin breaks, tears or lesions Psychiatry: Appropriate, no evidence of psychoses Neurology: No focal deficits   Data Reviewed: CBC: Recent Labs  Lab 12/30/20 1443  WBC 5.5  HGB 8.9*  HCT 27.0*  MCV 94.4  PLT 798   Basic Metabolic Panel: Recent Labs  Lab 12/30/20 1443  NA 136  K 4.6  CL 103  CO2 26  GLUCOSE 211*  BUN 25*  CREATININE 1.15  CALCIUM 8.9   GFR: Estimated Creatinine Clearance: 43.1 mL/min (by C-G formula based on SCr of 1.15 mg/dL). Liver Function Tests: No results for input(s): AST, ALT, ALKPHOS, BILITOT, PROT, ALBUMIN in the last 168 hours. No results for input(s): LIPASE, AMYLASE in the last 168 hours. No results for input(s): AMMONIA in the last 168 hours. Coagulation Profile: No results for input(s): INR, PROTIME in the last 168 hours. Cardiac Enzymes: No results for input(s): CKTOTAL, CKMB, CKMBINDEX, TROPONINI in the last 168 hours. BNP (last 3 results) No results for input(s): PROBNP in the last 8760 hours. HbA1C: No results for input(s): HGBA1C in the last 72 hours. CBG: No results for input(s): GLUCAP in the last 168 hours. Lipid Profile: No results for input(s): CHOL, HDL, LDLCALC, TRIG, CHOLHDL, LDLDIRECT in the last 72 hours. Thyroid Function Tests: No results for input(s): TSH,  T4TOTAL, FREET4, T3FREE, THYROIDAB in the last 72 hours. Anemia Panel: No results for input(s): VITAMINB12, FOLATE, FERRITIN, TIBC, IRON, RETICCTPCT in the last 72 hours. Urine analysis: No results found for: COLORURINE, APPEARANCEUR, LABSPEC, PHURINE, GLUCOSEU, HGBUR, BILIRUBINUR, KETONESUR, PROTEINUR, UROBILINOGEN, NITRITE,  LEUKOCYTESUR Sepsis Labs: @LABRCNTIP (procalcitonin:4,lacticidven:4)  ) Recent Results (from the past 240 hour(s))  Resp Panel by RT-PCR (Flu A&B, Covid) Nasopharyngeal Swab     Status: Abnormal   Collection Time: 12/30/20  6:40 PM   Specimen: Nasopharyngeal Swab; Nasopharyngeal(NP) swabs in vial transport medium  Result Value Ref Range Status   SARS Coronavirus 2 by RT PCR POSITIVE (A) NEGATIVE Corrected    Comment: READ BACK AND VERIFIED BY Ladell Pier, RN AT 1940 12/30/20 BY [facility] RESULT CALLED TO, READ BACK BY AND VERIFIED WITH: (NOTE) SARS-CoV-2 target nucleic acids are DETECTED.  The SARS-CoV-2 RNA is generally detectable in upper respiratory specimens during the acute phase of infection. Positive results are indicative of the presence of the identified virus, but do not rule out bacterial infection or co-infection with other pathogens not detected by the test. Clinical correlation with patient history and other diagnostic information is necessary to determine patient infection status. The expected result is Negative.  Fact Sheet for Patients: EntrepreneurPulse.com.au  Fact Sheet for Healthcare Providers: IncredibleEmployment.be  This test is not yet approved or cleared by the [place] FDA and  has been authorized for detection and/or diagnosis of SARS-CoV-2 by FDA under an Emergency Use Authorization (EUA).  This EUA will remain  in effect (meaning this test can be used) for the duration of  the COVID-19 declaration under Section 564(b)(1) of the Act, 21 U.S.C. section 360bbb-3(b)(1), unless the authorization is terminated or revoked sooner.  CORRECTED ON 11/16 AT 0759: PREVIOUSLY REPORTED AS POSITIVE READ BACK AND VERIFIED BY Ladell Pier, RN AT 1940 12/30/20 BY [facility]    Influenza A by PCR NEGATIVE NEGATIVE Final   Influenza B by PCR NEGATIVE NEGATIVE Final    Comment: (NOTE) The Xpert Xpress SARS-CoV-2/FLU/RSV plus assay is  intended as an aid in the diagnosis of influenza from Nasopharyngeal swab specimens and should not be used as a sole basis for treatment. Nasal washings and aspirates are unacceptable for Xpert Xpress SARS-CoV-2/FLU/RSV testing.  Fact Sheet for Patients: EntrepreneurPulse.com.au  Fact Sheet for Healthcare Providers: IncredibleEmployment.be  This test is not yet approved or cleared by the [place] FDA and has been authorized for detection and/or diagnosis of SARS-CoV-2 by FDA under an Emergency Use Authorization (EUA). This EUA will remain in effect (meaning this test can be used) for the duration of the COVID-19 declaration under Section 564(b)(1) of the Act, 21 U.S.C. section 360bbb-3(b)(1), unless the authorization is terminated or revoked.  Performed at [facility], [address] [place], [place] [address]       Studies: DG Pelvis 1-2 Views  Result Date: 12/30/2020 CLINICAL DATA:  Trauma, fall, pain EXAM: PELVIS - 1-2 VIEW COMPARISON:  09/15/2019 FINDINGS: No recent displaced fracture is seen. Osteopenia is seen in bony structures. There is previous right hip arthroplasty. There is previous internal fixation of intertrochanteric fracture of left femur with intramedullary rod. Extensive arterial calcifications are seen. Degenerative changes are noted in the visualized lower lumbar spine. IMPRESSION: No recent fracture is seen. Other findings as described in the body of the report. Electronically Signed   By: Elmer Picker M.D.   On: 12/30/2020 16:40   Chest Portable 1 View  Result Date: 12/30/2020 CLINICAL DATA:  Fall  EXAM: PORTABLE CHEST 1 VIEW COMPARISON:  09/14/2019 FINDINGS: Heart and mediastinal contours are within normal limits. No focal opacities or effusions. No acute bony abnormality. Aortic atherosclerosis. IMPRESSION: No active disease. Electronically Signed   By: Rolm Baptise M.D.   On: 12/30/2020 22:47   DG  FEMUR MIN 2 VIEWS LEFT  Result Date: 12/30/2020 CLINICAL DATA:  Trauma, pain EXAM: LEFT FEMUR 2 VIEWS COMPARISON:  09/14/2019 FINDINGS: There is spiral fracture in the mid to distal shaft of left femur. There is distraction of the fracture fragments with posterior displacement of distal major fracture fragment. There is previous internal fixation of intertrochanteric fracture of left femur. There is previous left knee arthroplasty. Extensive arterial calcifications are seen in the soft tissues. IMPRESSION: There is displaced oblique/spiral fracture in the mid and distal shaft of left femur. Other findings as described in the body of the report. Electronically Signed   By: Elmer Picker M.D.   On: 12/30/2020 16:43    Scheduled Meds:  ceFAZolin  1 g Other To OR   nicotine  14 mg Transdermal Daily    Continuous Infusions:   LOS: 1 day     Annita Brod, MD [facility]   12/31/2020, 10:59 AM

## 2020-12-31 NOTE — Progress Notes (Signed)
Patient ad  12/31/20 1803  Vitals  Temp 97.7 F (36.5 C)  Temp Source Oral  BP 121/71  MAP (mmHg) 87  BP Method Automatic  Pulse Rate 99  Pulse Rate Source Monitor  Level of Consciousness  Level of Consciousness Alert  MEWS COLOR  MEWS Score Color Green  Oxygen Therapy  SpO2 98 %  O2 Device Room Air  MEWS Score  MEWS Temp 0  MEWS Systolic 0  MEWS Pulse 0  MEWS RR 1  MEWS LOC 0  MEWS Score 1    Patient admitted into room 147 from PACU. Alert oriented x4. Denies pain at this time. Incision dressing without bleeding. ON tele monitor and continuous pulse oximetry. Sats WNL on RA.

## 2020-12-31 NOTE — Op Note (Signed)
12/30/2020 - 12/31/2020  5:22 PM  PATIENT:  Corey Gonzalez  80 y.o. male  PRE-OPERATIVE DIAGNOSIS:  Left distal periprosthetic fracture  POST-OPERATIVE DIAGNOSIS:  Left distal periprosthetic fracture  PROCEDURE:  Procedure(s): OPEN REDUCTION INTERNAL FIXATION (ORIF) DISTAL FEMUR FRACTURE - PERIPROSTHETIC FRACTURE (Left)  SURGEON: Laurene Footman, MD  ASSISTANTS: None  ANESTHESIA:   general  EBL:  Total I/O In: 1050 [I.V.:1000; IV Piggyback:50] Out: 100 [Blood:100]  BLOOD ADMINISTERED:none  DRAINS: none   LOCAL MEDICATIONS USED:  NONE  SPECIMEN:  No Specimen  DISPOSITION OF SPECIMEN:  N/A  COUNTS:  YES  TOURNIQUET:  * No tourniquets in log *  IMPLANTS: Synthes locking distal femur plate left  DICTATION: .Dragon Dictation patient was brought to the operating room and after adequate general anesthesia was obtained and appropriate COVID and MRSA precautions were taken the left leg was prepped and draped in the usual sterile fashion.  After patient identification and timeout procedures were completed C arm was brought in to determine level of incisions.  The proximal incision was performed first with exposure of the IT band and the vastus lateralis was elevated submuscularly and the prior screw through the distal portion of the rod was exposed but buried in bone so seem posterior enough that it might not need to be removed so attention was turned distally with anterior lateral exposure of the distal femur IT band incised and elevated and exposure of the distal femur when this was adequately exposed a sub muscular plate was placed after terming length based on trials using the C arm to assess.  2 nonlocking screws were placed just bring the plate down to the distal fragment with traction applied wire was placed through the proximal fragment to maintain alignment.  The proximal screw holes were then filled bringing the plate down to the shaft and these plate these screws were placed  through the cortex anterior posterior to the rod and bicortical fixation was obtained and at 4 cortical screw sites.  Going distally all the distal locking screw holes were filled putting the guide drilling and placing the locking screw with a torque screwdriver to prevent cold welding.  AP and lateral imaging was obtained that showed acceptable alignment in both AP and lateral projections.  The wounds were thoroughly irrigated and the IT band closed with running #1 Vicryl at both ends of the incision with 2-0 Vicryl subcutaneously and skin staples.  Xeroform 4 x 4's ABD and foam tape applied.  PLAN OF CARE: Admit to inpatient   PATIENT DISPOSITION:  PACU - hemodynamically stable.

## 2020-12-31 NOTE — Anesthesia Procedure Notes (Signed)
Procedure Name: Intubation Date/Time: 12/31/2020 3:54 PM Performed by: Chanetta Marshall, CRNA Pre-anesthesia Checklist: Patient identified, Emergency Drugs available, Suction available and Patient being monitored Patient Re-evaluated:Patient Re-evaluated prior to induction Oxygen Delivery Method: Circle system utilized Preoxygenation: Pre-oxygenation with 100% oxygen Induction Type: IV induction Ventilation: Mask ventilation without difficulty Laryngoscope Size: McGraph and 3 Grade View: Grade I Tube type: Oral Tube size: 7.0 mm Number of attempts: 1 Airway Equipment and Method: Video-laryngoscopy Placement Confirmation: ETT inserted through vocal cords under direct vision, positive ETCO2, breath sounds checked- equal and bilateral and CO2 detector Secured at: 21 cm Tube secured with: Tape Dental Injury: Teeth and Oropharynx as per pre-operative assessment

## 2021-01-01 ENCOUNTER — Encounter: Payer: Self-pay | Admitting: Orthopedic Surgery

## 2021-01-01 DIAGNOSIS — S72342A Displaced spiral fracture of shaft of left femur, initial encounter for closed fracture: Principal | ICD-10-CM

## 2021-01-01 DIAGNOSIS — D62 Acute posthemorrhagic anemia: Secondary | ICD-10-CM

## 2021-01-01 LAB — HIGH SENSITIVITY CRP: CRP, High Sensitivity: 14.96 mg/L — ABNORMAL HIGH (ref 0.00–3.00)

## 2021-01-01 LAB — BASIC METABOLIC PANEL
Anion gap: 8 (ref 5–15)
BUN: 22 mg/dL (ref 8–23)
CO2: 26 mmol/L (ref 22–32)
Calcium: 8.1 mg/dL — ABNORMAL LOW (ref 8.9–10.3)
Chloride: 100 mmol/L (ref 98–111)
Creatinine, Ser: 1.4 mg/dL — ABNORMAL HIGH (ref 0.61–1.24)
GFR, Estimated: 51 mL/min — ABNORMAL LOW (ref >60)
Glucose, Bld: 163 mg/dL — ABNORMAL HIGH (ref 70–99)
Potassium: 5.3 mmol/L — ABNORMAL HIGH (ref 3.5–5.1)
Sodium: 134 mmol/L — ABNORMAL LOW (ref 135–145)

## 2021-01-01 LAB — CBC
HCT: 20 % — ABNORMAL LOW (ref 39.0–52.0)
Hemoglobin: 6.5 g/dL — ABNORMAL LOW (ref 13.0–17.0)
MCH: 31.3 pg (ref 26.0–34.0)
MCHC: 32.5 g/dL (ref 30.0–36.0)
MCV: 96.2 fL (ref 80.0–100.0)
Platelets: 165 10*3/uL (ref 150–400)
RBC: 2.08 MIL/uL — ABNORMAL LOW (ref 4.22–5.81)
RDW: 16.1 % — ABNORMAL HIGH (ref 11.5–15.5)
WBC: 8.2 10*3/uL (ref 4.0–10.5)
nRBC: 0 % (ref 0.0–0.2)

## 2021-01-01 LAB — PREPARE RBC (CROSSMATCH)

## 2021-01-01 LAB — GLUCOSE, CAPILLARY
Glucose-Capillary: 142 mg/dL — ABNORMAL HIGH (ref 70–99)
Glucose-Capillary: 154 mg/dL — ABNORMAL HIGH (ref 70–99)
Glucose-Capillary: 114 mg/dL — ABNORMAL HIGH (ref 70–99)
Glucose-Capillary: 165 mg/dL — ABNORMAL HIGH (ref 70–99)
Glucose-Capillary: 167 mg/dL — ABNORMAL HIGH (ref 70–99)

## 2021-01-01 MED ORDER — SODIUM CHLORIDE 0.9% IV SOLUTION: Freq: Once | INTRAVENOUS | Status: AC

## 2021-01-01 NOTE — Evaluation (Signed)
Physical Therapy Evaluation Patient Details Name: Corey Gonzalez MRN: [id number] DOB: [birth year] Today's Date: 01/01/2021  History of Present Illness  Pt is a 80 y.o. male presenting to hospital 11/15 s/p fall (walking up a ramp and lost balance falling backwards); felt snap in L femur and unable to bear weight.  (+) COVID-19 11/15.  Recent discharge 11/14 from STR after colon resection in September.  S/p L ORIF distal femur fx/periprosthetic fx 12/31/20.  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  Clinical Impression  Prior to hospital admission, pt was ambulatory with walker (just discharged home from SNF on Monday) and lives with his girlfriend in 1 level home with ramp to enter.  Pt s/p blood transfusion this morning; pt's nurse cleared pt for physical therapy.  Pt with very limited L knee flexion ROM d/t pain (L LE very painful with minimal movement)--pt received pain meds before and during session.  Currently pt is mod to max assist with bed mobility and mod to max assist to lateral scoot towards L along bed.  Standing deferred d/t pt initially appearing pale in sitting (although improved with time sitting edge of bed) and then pt unable to WB through L LE without significant L thigh pain.  Despite pain, pt appearing very motivated to participate in therapy and was very pleasant and cooperative during sessions activities.  Pt would benefit from skilled PT to address noted impairments and functional limitations (see below for any additional details).  Upon hospital discharge, pt would benefit from SNF.    Recommendations for follow up therapy are one component of a multi-disciplinary discharge planning process, led by the attending physician.  Recommendations may be updated based on patient status, additional functional criteria and insurance authorization.  Follow Up Recommendations Skilled nursing-short  term rehab (<3 hours/day)    Assistance Recommended at Discharge Frequent or constant Supervision/Assistance  Functional Status Assessment Patient has had a recent decline in their functional status and demonstrates the ability to make significant improvements in function in a reasonable and predictable amount of time.  Equipment Recommendations  Rolling walker (2 wheels);BSC/3in1;Wheelchair (measurements PT);Wheelchair cushion (measurements PT)    Recommendations for Other Services OT consult     Precautions / Restrictions Precautions Precautions: Fall Precaution Comments: LLQ colostomy Restrictions Weight Bearing Restrictions: Yes LLE Weight Bearing: Weight bearing as tolerated Other Position/Activity Restrictions: Per MD Rudene Christians 11/17: no L knee ROM restrictions      Mobility  Bed Mobility Overal bed mobility: Needs Assistance Bed Mobility: Supine to Sit;Sit to Supine     Supine to sit: Mod assist;Max assist;HOB elevated Sit to supine: Mod assist;Max assist;HOB elevated   General bed mobility comments: assist for L LE and trunk; vc's for technique; increased effort/time to perform    Transfers Overall transfer level: Needs assistance Equipment used: None Transfers: Bed to chair/wheelchair/BSC            Lateral/Scoot Transfers: Mod assist;Max assist General transfer comment: lateral scooting towards L side along bed x4 trials; vc's for technique    Ambulation/Gait               General Gait Details: deferred d/t significant L thigh pain  Stairs            Wheelchair Mobility    Modified Rankin (Stroke Patients Only)       Balance Overall balance assessment: Needs assistance Sitting-balance support: Bilateral upper  extremity supported;Feet supported Sitting balance-Leahy Scale: Fair Sitting balance - Comments: steady static sitting                                     Pertinent Vitals/Pain Pain Assessment: 0-10 Pain Score: 6   Pain Location: L mid/distal thigh Pain Descriptors / Indicators: Aching;Sore;Tender Pain Intervention(s): Limited activity within patient's tolerance;Monitored during session;Premedicated before session;Repositioned;Patient requesting pain meds-RN notified;RN gave pain meds during session;Other (comment) (ice pack applied (NT notified of pt needing more ice for ice pack)) Vitals (HR and O2 on room air) stable and WFL throughout treatment session.    Home Living Family/patient expects to be discharged to:: Private residence Living Arrangements: Spouse/significant other (pt's girlfriend (h/o stroke in 2019)) Available Help at Discharge: Family;Available PRN/intermittently Type of Home: House Home Access: Ramped entrance       Home Layout: One level Home Equipment: Conservation officer, nature (2 wheels);Rollator (4 wheels);BSC/3in1;Grab bars - toilet;Grab bars - tub/shower;Shower seat;Wheelchair - manual Additional Comments: Sister in Sports coach is a retired Marine scientist (can assist PRN); pt's significant other unable to physically assist pt    Prior Function Prior Level of Function : Independent/Modified Independent             Mobility Comments: Ambulatory with 4ww.       Hand Dominance        Extremity/Trunk Assessment   Upper Extremity Assessment Upper Extremity Assessment: Generalized weakness    Lower Extremity Assessment Lower Extremity Assessment: LLE deficits/detail (R LE at least 3/5 AROM hip flexion, knee flexion/extension, and DF/PF) LLE Deficits / Details: at least 3/5 AROM ankle DF/PF; fair L quad set isometric strength; limited L knee flexion AROM d/t significant pain (grossly 10 degrees flexion in bed and 30 degrees sitting edge of bed) LLE: Unable to fully assess due to pain    Cervical / Trunk Assessment Cervical / Trunk Assessment: Normal  Communication   Communication: HOH  Cognition Arousal/Alertness: Awake/alert Behavior During Therapy: WFL for tasks  assessed/performed Overall Cognitive Status: Within Functional Limits for tasks assessed                                          General Comments General comments (skin integrity, edema, etc.): L thigh dressings in place.  Nursing cleared pt for participation in physical therapy.  Pt agreeable to PT session.  Pt's family member arrived during session.    Exercises Total Joint Exercises Ankle Circles/Pumps: AROM;Strengthening;Both;10 reps;Supine Quad Sets: AROM;Strengthening;Both;10 reps;Supine Hip ABduction/ADduction: AAROM;Strengthening;Left;10 reps;Supine   Assessment/Plan    PT Assessment Patient needs continued PT services  PT Problem List Decreased strength;Decreased range of motion;Decreased activity tolerance;Decreased balance;Decreased mobility;Decreased knowledge of use of DME;Decreased knowledge of precautions;Pain;Decreased skin integrity       PT Treatment Interventions DME instruction;Gait training;Functional mobility training;Therapeutic activities;Therapeutic exercise;Balance training;Patient/family education    PT Goals (Current goals can be found in the Care Plan section)  Acute Rehab PT Goals Patient Stated Goal: to go home PT Goal Formulation: With patient Time For Goal Achievement: 01/15/21 Potential to Achieve Goals: Fair    Frequency BID   Barriers to discharge Decreased caregiver support      Co-evaluation               AM-PAC PT "6 Clicks" Mobility  Outcome Measure Help needed turning from your  back to your side while in a flat bed without using bedrails?: A Little Help needed moving from lying on your back to sitting on the side of a flat bed without using bedrails?: A Lot Help needed moving to and from a bed to a chair (including a wheelchair)?: Total Help needed standing up from a chair using your arms (e.g., wheelchair or bedside chair)?: Total Help needed to walk in hospital room?: Total Help needed climbing 3-5 steps  with a railing? : Total 6 Click Score: 9    End of Session   Activity Tolerance: Patient limited by pain Patient left: in bed;with call bell/phone within reach;with bed alarm set;with family/visitor present;with SCD's reapplied;Other (comment) (B heels floating via pillow support) Nurse Communication: Mobility status;Precautions;Patient requests pain meds;Weight bearing status PT Visit Diagnosis: Other abnormalities of gait and mobility (R26.89);Muscle weakness (generalized) (M62.81);History of falling (Z91.81);Pain Pain - Right/Left: Left Pain - part of body: Hip    Time: 1334-1430 PT Time Calculation (min) (ACUTE ONLY): 56 min   Charges:   PT Evaluation $PT Eval Low Complexity: 1 Low PT Treatments $Therapeutic Exercise: 8-22 mins $Therapeutic Activity: 23-37 mins       Leitha Bleak, PT 01/01/21, 4:49 PM

## 2021-01-01 NOTE — TOC Progression Note (Signed)
Transition of Care [facility]) - Progression Note    Patient Details  Name: Corey Gonzalez MRN: [id number] Date of Birth: 09/07/1940  Transition of [facility]) CM/SW [place], RN Phone Number: 01/01/2021, 2:54 PM  Clinical Narrative:   Patient fell at home, He has Covid positive incidental finding and is now getting blood, PT to evaluate the patient and make recommendations, I anticipate will need STR SNF, however with the Covid positive will be difficult to locate a bed offer unitl after the 10 day quarantine         Expected Discharge Plan and Services                                                 Social Determinants of Health (SDOH) Interventions    Readmission Risk Interventions No flowsheet data found.

## 2021-01-01 NOTE — Progress Notes (Signed)
   Subjective: 1 Day Post-Op Procedure(s) (LRB): OPEN REDUCTION INTERNAL FIXATION (ORIF) DISTAL FEMUR FRACTURE - PERIPROSTHETIC FRACTURE (Left) Patient reports pain as mild.   Patient is well, and has had no acute complaints or problems Denies any CP, SOB, ABD pain. We will continue therapy today.  Plan is to go Home after hospital stay.  Objective: Vital signs in last 24 hours: Temp:  [97.7 F (36.5 C)-98.6 F (37 C)] 97.7 F (36.5 C) (11/17 0729) Pulse Rate:  [89-119] 98 (11/17 0729) Resp:  [9-20] 16 (11/17 0729) BP: (95-160)/(53-104) 112/59 (11/17 0729) SpO2:  [97 %-100 %] 97 % (11/17 0729)  Intake/Output from previous day: 11/16 0701 - 11/17 0700 In: 1702.8 [I.V.:1552.8; IV Piggyback:150] Out: 200 [Stool:100; Blood:100] Intake/Output this shift: No intake/output data recorded.  Recent Labs    12/30/20 1443 01/01/21 0459  HGB 8.9* 6.5*   Recent Labs    12/30/20 1443 01/01/21 0459  WBC 5.5 8.2  RBC 2.86* 2.08*  HCT 27.0* 20.0*  PLT 160 165   Recent Labs    12/30/20 1443 01/01/21 0459  NA 136 134*  K 4.6 5.3*  CL 103 100  CO2 26 26  BUN 25* 22  CREATININE 1.15 1.40*  GLUCOSE 211* 163*  CALCIUM 8.9 8.1*   No results for input(s): LABPT, INR in the last 72 hours.  EXAM General - Patient is Alert, Appropriate, and Oriented Extremity - Neurovascular intact Sensation intact distally Intact pulses distally Dorsiflexion/Plantar flexion intact No cellulitis present Compartment soft Dressing - dressing C/D/I and no drainage Motor Function - intact, moving foot and toes well on exam.   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   Weight as of this encounter: 58.5 kg. Advance diet Up with therapy Pain well controlled Vital signs are stable Acute postop blood loss anemia, hemoglobin 6.5.  Transfuse 1 unit of packed red blood cells and recheck hemoglobin this afternoon. Recheck labs in the morning Care management to assist with discharge  DVT Prophylaxis - TED hose and SCDs, Eliquis Weight-Bearing as tolerated to left leg   T. Rachelle Hora, PA-C Start 01/01/2021, 8:23 AM

## 2021-01-01 NOTE — Progress Notes (Signed)
Nutrition Follow-up  DOCUMENTATION CODES:   Not applicable  INTERVENTION:   -Continue MVI with minerals daily -Continue Glucerna Shake po BID, each supplement provides 220 kcal and 10 grams of protein  -Continue Ensure Max po daily, each supplement provides 150 kcal and 30 grams of protein  NUTRITION DIAGNOSIS:   Increased nutrient needs related to post-op healing as evidenced by estimated needs.  Ongoing  GOAL:   Patient will meet greater than or equal to 90% of their needs  Progressing   MONITOR:   PO intake, Supplement acceptance, Diet advancement, Labs, Weight trends, Skin, I & O's  REASON FOR ASSESSMENT:   Consult Assessment of nutrition requirement/status, Hip fracture protocol  ASSESSMENT:   Corey Gonzalez is a 80 y.o. male seen in ed with complaints of fall and left hip pain.  11/16- s/p Procedure(s): OPEN REDUCTION INTERNAL FIXATION (ORIF) DISTAL FEMUR FRACTURE - PERIPROSTHETIC FRACTURE (Left)  Reviewed I/O's: +1.5 L x 24 hours and +1.2 L since admission  Spoke with pt and sister at bedside. Pt reports a general decline in health over the past 2 months related to colon cancer surgery and prolonged hospitalization and rehabilitation. He shares he just returned home from SNF when he fell again. Pt reports decreased oral intake over the past 2 months due to hospital food not tasting good. Since admission, intake has improved and he has been consumed 100% of meals. He also enjoys Glucerna and Ensure Max supplements.   Per pt, UBW is around 150#. He estimates that he has lsost about 20# within the past few months. Reviewed wt hx; pt has experienced a 21.8% wt loss over the past 16 months. While this is not significant for time frame, it is concerning given pt's advanced age and multiple co-morbidities.   Discussed importance of good meal and supplement intake to promote healing. Also provided box of tissues per his request.   Medications reviewed and include 0.9%  sodium chloride infusion @ 75 ml/hr and colace.   Lab Results  Component Value Date   HGBA1C 7.5 (H) 09/15/2019   PTA DM medications are .   Labs reviewed: Na: 134, K: 5.3, CBGS: 114-183 (inpatient orders for glycemic control are 500 mg metformin BID and 0-15 units insulin aspart TID with meals).    NUTRITION - FOCUSED PHYSICAL EXAM:  Flowsheet Row Most Recent Value  Orbital Region Mild depletion  Upper Arm Region Moderate depletion  Thoracic and Lumbar Region Mild depletion  Buccal Region Mild depletion  Temple Region Mild depletion  Clavicle Bone Region Moderate depletion  Clavicle and Acromion Bone Region Moderate depletion  Scapular Bone Region Moderate depletion  Dorsal Hand Mild depletion  Patellar Region Mild depletion  Anterior Thigh Region Mild depletion  Posterior Calf Region Mild depletion  Edema (RD Assessment) None  Hair Reviewed  Eyes Reviewed  Mouth Reviewed  Skin Reviewed  Nails Reviewed       Diet Order:   Diet Order             Diet Carb Modified Fluid consistency: Thin; Room service appropriate? Yes  Diet effective now                   EDUCATION NEEDS:   Not appropriate for education at this time  Skin:  Skin Assessment: Skin Integrity Issues: Skin Integrity Issues:: Incisions Incisions: closed lt hip  Last BM:  Unknown  Height:   Ht Readings from Last 1 Encounters:  12/30/20 5\' 7"  (1.702 m)  Weight:   Wt Readings from Last 1 Encounters:  12/30/20 58.5 kg    Ideal Body Weight:  67.3 kg  BMI:  Body mass index is 20.2 kg/m.  Estimated Nutritional Needs:   Kcal:  1850-2050  Protein:  105-120 grams  Fluid:  > 1.8 L    Loistine Chance, RD, LDN, [place] Registered Dietitian II Certified Diabetes Care and Education Specialist Please refer to [facility] for RD and/or RD on-call/weekend/after hours pager

## 2021-01-01 NOTE — Progress Notes (Signed)
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  Also found to be incidentally COVID-positive.  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  Patient seen by orthopedic surgery and underwent ORIF on the morning of 11/16.  This morning, hemoglobin dropped to 6.5 and patient receiving 1 unit packed red blood cells.  Patient complains of some soreness otherwise doing well.  Assessment/Plan: Principal Problem:   Fall at home, initial encounter causing closed left hip fracture: Plan for surgery later today.  Aggressive rehab status post ORIF.  PT evaluation after.  Blood loss anemia from surgery: Receiving 1 unit packed red blood cells.  Continue to follow.    Diabetes mellitus without complication ([place]): N.p.o. and every 6 hours sliding scale.   Chronic kidney disease, stage 3a ([place]): At baseline.  Covid 19 infection: Vital signs stable.  Looks to be incidental infection.  Checking    Hypertension: Blood pressure stable.    Alcohol dependence ([place]): Patient has not been drinking since before previous hospitalization   Tobacco abuse: Nicotine patch   Gastro-esophageal reflux disease without esophagitis  Code Status: Full code  Family Communication: Left message for daughter  Disposition Plan: May end up needing skilled nursing versus home with home health   Consultants: Orthopedic surgery  Procedures: Status post ORIF 11/16 Transfusion 1 unit packed red blood cells 11/16  Antimicrobials: Preop Ancef  DVT prophylaxis: Initially SCDs, changed to Eliquis.  Level of care: Telemetry Medical    Objective: Vitals:   01/01/21 0900 01/01/21 0919  BP: (!) 115/54 (!) 106/51  Pulse: (!) 102 100  Resp: 18 18  Temp: 99.1 F (37.3 C) 99 F (37.2 C)  SpO2: 100% 98%    Intake/Output Summary (Last 24 hours) at 01/01/2021 0937 Last data filed at 01/01/2021 0300 Gross per 24 hour  Intake 1702.76 ml  Output 200 ml  Net 1502.76 ml    Filed Weights   12/30/20 1438  Weight: 58.5 kg   Body mass index is 20.2 kg/m.  Exam:  General: Alert and oriented x3, no acute distress HEENT: Normocephalic and atraumatic, mucous membranes slightly dry Cardiovascular: Regular rate and rhythm, S1-S2 Respiratory: Clear to auscultation bilaterally Abdomen: Soft, nontender, nondistended, positive bowel sounds Musculoskeletal: No clubbing or cyanosis or edema Skin: No skin breaks, tears or lesions Psychiatry: Appropriate, no evidence of psychoses Neurology: No focal deficits   Data Reviewed: CBC: Recent Labs  Lab 12/30/20 1443 01/01/21 0459  WBC 5.5 8.2  HGB 8.9* 6.5*  HCT 27.0* 20.0*  MCV 94.4 96.2  PLT 160 979    Basic Metabolic Panel: Recent Labs  Lab 12/30/20 1443 01/01/21 0459  NA 136 134*  K 4.6 5.3*  CL 103 100  CO2 26 26  GLUCOSE 211* 163*  BUN 25* 22  CREATININE 1.15 1.40*  CALCIUM 8.9 8.1*    GFR: Estimated Creatinine Clearance: 35.4 mL/min (A) (by C-G formula based on SCr of 1.4 mg/dL (H)). Liver Function Tests: No results for input(s): AST, ALT, ALKPHOS, BILITOT, PROT, ALBUMIN in the last 168 hours. No results for input(s): LIPASE, AMYLASE in the last 168 hours. No results for  input(s): AMMONIA in the last 168 hours. Coagulation Profile: No results for input(s): INR, PROTIME in the last 168 hours. Cardiac Enzymes: No results for input(s): CKTOTAL, CKMB, CKMBINDEX, TROPONINI in the last 168 hours. BNP (last 3 results) No results for input(s): PROBNP in the last 8760 hours. HbA1C: No results for input(s): HGBA1C in the last 72 hours. CBG: Recent Labs   Lab 12/31/20 2208 01/01/21 0832  GLUCAP 183* 142*   Lipid Profile: No results for input(s): CHOL, HDL, LDLCALC, TRIG, CHOLHDL, LDLDIRECT in the last 72 hours. Thyroid Function Tests: No results for input(s): TSH, T4TOTAL, FREET4, T3FREE, THYROIDAB in the last 72 hours. Anemia Panel: No results for input(s): VITAMINB12, FOLATE, FERRITIN, TIBC, IRON, RETICCTPCT in the last 72 hours. Urine analysis: No results found for: COLORURINE, APPEARANCEUR, LABSPEC, PHURINE, GLUCOSEU, HGBUR, BILIRUBINUR, KETONESUR, PROTEINUR, UROBILINOGEN, NITRITE, LEUKOCYTESUR Sepsis Labs: @LABRCNTIP (procalcitonin:4,lacticidven:4)  ) Recent Results (from the past 240 hour(s))  Resp Panel by RT-PCR (Flu A&B, Covid) Nasopharyngeal Swab     Status: Abnormal   Collection Time: 12/30/20  6:40 PM   Specimen: Nasopharyngeal Swab; Nasopharyngeal(NP) swabs in vial transport medium  Result Value Ref Range Status   SARS Coronavirus 2 by RT PCR POSITIVE (A) NEGATIVE Corrected    Comment: READ BACK AND VERIFIED BY Ladell Pier, RN AT 1940 12/30/20 BY [facility] RESULT CALLED TO, READ BACK BY AND VERIFIED WITH: (NOTE) SARS-CoV-2 target nucleic acids are DETECTED.  The SARS-CoV-2 RNA is generally detectable in upper respiratory specimens during the acute phase of infection. Positive results are indicative of the presence of the identified virus, but do not rule out bacterial infection or co-infection with other pathogens not detected by the test. Clinical correlation with patient history and other diagnostic information is necessary to determine patient infection status. The expected result is Negative.  Fact Sheet for Patients: EntrepreneurPulse.com.au  Fact Sheet for Healthcare Providers: IncredibleEmployment.be  This test is not yet approved or cleared by the [place] FDA and  has been authorized for detection and/or diagnosis of SARS-CoV-2 by FDA under an Emergency Use  Authorization (EUA).  This EUA will remain  in effect (meaning this test can be used) for the duration of  the COVID-19 declaration under Section 564(b)(1) of the Act, 21 U.S.C. section 360bbb-3(b)(1), unless the authorization is terminated or revoked sooner.  CORRECTED ON 11/16 AT 0759: PREVIOUSLY REPORTED AS POSITIVE READ BACK AND VERIFIED BY Ladell Pier, RN AT 1940 12/30/20 BY [facility]    Influenza A by PCR NEGATIVE NEGATIVE Final   Influenza B by PCR NEGATIVE NEGATIVE Final    Comment: (NOTE) The Xpert Xpress SARS-CoV-2/FLU/RSV plus assay is intended as an aid in the diagnosis of influenza from Nasopharyngeal swab specimens and should not be used as a sole basis for treatment. Nasal washings and aspirates are unacceptable for Xpert Xpress SARS-CoV-2/FLU/RSV testing.  Fact Sheet for Patients: EntrepreneurPulse.com.au  Fact Sheet for Healthcare Providers: IncredibleEmployment.be  This test is not yet approved or cleared by the [place] FDA and has been authorized for detection and/or diagnosis of SARS-CoV-2 by FDA under an Emergency Use Authorization (EUA). This EUA will remain in effect (meaning this test can be used) for the duration of the COVID-19 declaration under Section 564(b)(1) of the Act, 21 U.S.C. section 360bbb-3(b)(1), unless the authorization is terminated or revoked.  Performed at [facility], [address] [place], [place] [address]   Surgical pcr screen     Status: Abnormal   Collection Time: 12/31/20 10:36 AM   Specimen:  Nasal Mucosa; Nasal Swab  Result Value Ref Range Status   MRSA, PCR DETECTED (A) NEGATIVE Final    Comment: RESULT CALLED TO, READ BACK BY AND VERIFIED WITH: MATTHEW WRIGHT 12/31/20 1451 [facility]    Staphylococcus aureus DETECTED (A) NEGATIVE Final    Comment: Performed at [facility], [address], [place], [place] [address]       Studies: No results found.  Scheduled  Meds:  apixaban  5 mg Oral BID   atorvastatin  20 mg Oral QHS   ceFAZolin  1 g Other To OR   docusate sodium  100 mg Oral BID   DULoxetine  20 mg Oral Daily   feeding supplement (GLUCERNA SHAKE)  237 mL Oral BID BM   insulin aspart  0-15 Units Subcutaneous TID WC   metFORMIN  500 mg Oral Q breakfast   multivitamin with minerals  1 tablet Oral Daily   nicotine  14 mg Transdermal Daily   pantoprazole  40 mg Oral Daily   Ensure Max Protein  11 oz Oral QHS   tamsulosin  0.4 mg Oral Daily   traMADol  50 mg Oral Q6H    Continuous Infusions:  sodium chloride 75 mL/hr at 12/31/20 1853    ceFAZolin (ANCEF) IV 2 g (01/01/21 0432)   methocarbamol (ROBAXIN) IV       LOS: 2 days     Annita Brod, MD [facility]   01/01/2021, 9:37 AM

## 2021-01-01 NOTE — Progress Notes (Signed)
PT Cancellation Note  Patient Details Name: Corey Gonzalez MRN: [id number] DOB: 04-Aug-1940   Cancelled Treatment:    Reason Eval/Treat Not Completed: Medical issues which prohibited therapy.  PT consult received.  Chart reviewed.  Pt's Hgb noted to be 6.5 this morning (and K+ elevated to 5.3).  Per PT guidelines for low Hgb, will hold PT at this time and re-attempt PT evaluation at a later date/time once medically appropriate (per chart review plan for blood transfusion today).  Leitha Bleak, PT 01/01/21, 8:41 AM

## 2021-01-02 DIAGNOSIS — E440 Moderate protein-calorie malnutrition: Secondary | ICD-10-CM

## 2021-01-02 LAB — HEMOGLOBIN AND HEMATOCRIT, BLOOD
HCT: 24.3 % — ABNORMAL LOW (ref 39.0–52.0)
Hemoglobin: 8 g/dL — ABNORMAL LOW (ref 13.0–17.0)

## 2021-01-02 LAB — CBC
HCT: 19.9 % — ABNORMAL LOW (ref 39.0–52.0)
Hemoglobin: 6.5 g/dL — ABNORMAL LOW (ref 13.0–17.0)
MCH: 30.2 pg (ref 26.0–34.0)
MCHC: 32.7 g/dL (ref 30.0–36.0)
MCV: 92.6 fL (ref 80.0–100.0)
Platelets: 112 10*3/uL — ABNORMAL LOW (ref 150–400)
RBC: 2.15 MIL/uL — ABNORMAL LOW (ref 4.22–5.81)
RDW: 16.5 % — ABNORMAL HIGH (ref 11.5–15.5)
WBC: 5.1 10*3/uL (ref 4.0–10.5)
nRBC: 0 % (ref 0.0–0.2)

## 2021-01-02 LAB — BASIC METABOLIC PANEL
Anion gap: 6 (ref 5–15)
BUN: 26 mg/dL — ABNORMAL HIGH (ref 8–23)
CO2: 26 mmol/L (ref 22–32)
Calcium: 7.9 mg/dL — ABNORMAL LOW (ref 8.9–10.3)
Chloride: 102 mmol/L (ref 98–111)
Creatinine, Ser: 1.21 mg/dL (ref 0.61–1.24)
GFR, Estimated: >60 mL/min (ref >60)
Glucose, Bld: 180 mg/dL — ABNORMAL HIGH (ref 70–99)
Potassium: 4.3 mmol/L (ref 3.5–5.1)
Sodium: 134 mmol/L — ABNORMAL LOW (ref 135–145)

## 2021-01-02 LAB — GLUCOSE, CAPILLARY
Glucose-Capillary: 224 mg/dL — ABNORMAL HIGH (ref 70–99)
Glucose-Capillary: 168 mg/dL — ABNORMAL HIGH (ref 70–99)
Glucose-Capillary: 163 mg/dL — ABNORMAL HIGH (ref 70–99)
Glucose-Capillary: 163 mg/dL — ABNORMAL HIGH (ref 70–99)
Glucose-Capillary: 135 mg/dL — ABNORMAL HIGH (ref 70–99)

## 2021-01-02 LAB — PREPARE RBC (CROSSMATCH)

## 2021-01-02 LAB — HIGH SENSITIVITY CRP: CRP, High Sensitivity: 70.15 mg/L — ABNORMAL HIGH (ref 0.00–3.00)

## 2021-01-02 LAB — SARS CORONAVIRUS 2 (TAT 6-24 HRS): SARS Coronavirus 2: POSITIVE — AB

## 2021-01-02 MED ORDER — HYDROCODONE-ACETAMINOPHEN 5-325 MG PO TABS
1.0000 | ORAL_TABLET | Freq: Four times a day (QID) | ORAL | 0 refills | Status: DC | PRN
Start: 2021-01-02 — End: 2021-07-10

## 2021-01-02 MED ORDER — SODIUM CHLORIDE 0.9% IV SOLUTION: Freq: Once | INTRAVENOUS | Status: AC

## 2021-01-02 NOTE — Progress Notes (Signed)
Physical Therapy Treatment Patient Details Name: Corey Gonzalez MRN: [id number] DOB: [birth year] Today's Date: 01/02/2021   History of Present Illness Pt is a 80 y.o. male presenting to hospital 11/15 s/p fall (walking up a ramp and lost balance falling backwards); felt snap in L femur and unable to bear weight.  (+) COVID-19 11/15.  Recent discharge 11/14 from STR after colon resection in September.  S/p L ORIF distal femur fx/periprosthetic fx 12/31/20.  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    PT Comments    Pt seen for modified pm session after receiving another blood transfusion due to HgB remaining at 6.5 however no new labs drawn, therefore limited session to there ex in bed. Pt did have 4/10 distal L femur pain with ROM and was unable to independently mobilize L LE without assist. Pt remains very weak and mobility progression has been limited due to blood value concerns. Will continue to see pt per POC as medically appropriate.   Recommendations for follow up therapy are one component of a multi-disciplinary discharge planning process, led by the attending physician.  Recommendations may be updated based on patient status, additional functional criteria and insurance authorization.  Follow Up Recommendations  Skilled nursing-short term rehab (<3 hours/day)     Assistance Recommended at Discharge Frequent or constant Supervision/Assistance  Equipment Recommendations  Rolling walker (2 wheels);BSC/3in1;Wheelchair (measurements PT);Wheelchair cushion (measurements PT)    Recommendations for Other Services OT consult     Precautions / Restrictions Precautions Precautions: Fall Precaution Comments: LLQ colostomy Restrictions Weight Bearing Restrictions: Yes LLE Weight Bearing: Weight bearing as tolerated Other Position/Activity Restrictions: Per MD Rudene Christians 11/17: no L knee ROM  restrictions     Mobility  Bed Mobility               General bed mobility comments:  (defered due to low Hgb post transfusion)    Transfers                        Ambulation/Gait                   Stairs             Wheelchair Mobility    Modified Rankin (Stroke Patients Only)       Balance                                            Cognition Arousal/Alertness: Awake/alert Behavior During Therapy: WFL for tasks assessed/performed Overall Cognitive Status: Within Functional Limits for tasks assessed                                          Exercises Total Joint Exercises Ankle Circles/Pumps: AROM;Strengthening;Both;10 reps;Supine Quad Sets: AROM;Strengthening;Both;10 reps;Supine Hip ABduction/ADduction: AAROM;Strengthening;Left;10 reps;Supine General Exercises - Lower Extremity Gluteal Sets: AROM;Both;10 reps Heel Slides: AAROM;Left;10 reps    General Comments General comments (skin integrity, edema, etc.):  (Pt educated on POC and current restrictions for PT to mobilize with low Hgb of 6.5 and no recent lab draws.)      Pertinent Vitals/Pain Pain Assessment: 0-10 Pain Score: 4  Pain Location: L mid/distal thigh Pain  Descriptors / Indicators: Aching;Sore;Tender Pain Intervention(s): Monitored during session    Home Living                          Prior Function            PT Goals (current goals can now be found in the care plan section) Acute Rehab PT Goals Patient Stated Goal: to go home    Frequency    BID      PT Plan      Co-evaluation              AM-PAC PT "6 Clicks" Mobility   Outcome Measure  Help needed turning from your back to your side while in a flat bed without using bedrails?: A Little Help needed moving from lying on your back to sitting on the side of a flat bed without using bedrails?: A Lot Help needed moving to and from a bed to a  chair (including a wheelchair)?: Total Help needed standing up from a chair using your arms (e.g., wheelchair or bedside chair)?: Total Help needed to walk in hospital room?: Total Help needed climbing 3-5 steps with a railing? : Total 6 Click Score: 9    End of Session   Activity Tolerance: Patient limited by pain Patient left: in bed;with call bell/phone within reach;with bed alarm set;with family/visitor present;with SCD's reapplied;Other (comment) Nurse Communication: Mobility status (HgB levels) PT Visit Diagnosis: Other abnormalities of gait and mobility (R26.89);Muscle weakness (generalized) (M62.81);History of falling (Z91.81);Pain Pain - Right/Left: Left Pain - part of body: Hip     Time: 6195-0932 PT Time Calculation (min) (ACUTE ONLY): 14 min  Charges:  $Therapeutic Exercise: 8-22 mins                    Mikel Cella, PTA    [name] 01/02/2021, 5:40 PM

## 2021-01-02 NOTE — Progress Notes (Signed)
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  Also found to be incidentally COVID-positive.  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  Patient seen by orthopedic surgery and underwent ORIF on the morning of 11/16.  By following morning, hemoglobin had dropped to 6.5 and patient received 1 unit packed red blood cells.  This morning, hemoglobin still at 6.5 and another unit of blood ordered.  Patient feeling okay, still little sore.  Assessment/Plan: Principal Problem:   Fall at home, initial encounter causing closed left hip fracture: Status post ORIF.  PT recommending very short-term skilled rehab, less than 3 hours a day.  Unfortunately, with his COVID finding, he would need to be in isolation for at least another week.  Orthopedics feels he could get rehab at home.  In discussion with case management, patient has likely use of all of his skilled nursing days and therefore would have to pay out-of-pocket.  He will likely need to go home with maximum home health services.  Blood loss anemia from surgery: Receiving another unit packed red blood cells.  I do not think that he is acutely bleeding.  Rather, looking at his lab work from 11/17, hemoglobin at 6.5, but with increased creatinine, he likely was lower than this and the 6.5 was also hemoconcentrated.  We will recheck lab work this afternoon    Diabetes mellitus without complication ([place]): N.p.o. and every 6 hours sliding scale.   Chronic kidney disease, stage 3a ([place]): At baseline.  Covid 19 infection: Vital signs stable.  Patient was convinced that  his initial lab was negative as he is not been around anyone and had had a recent negative COVID test at his previous rehab facility.  Rechecked yesterday and still COVID-positive.  Continue isolation.  No need for Remdisivir.  Nutrition Status: Nutrition Problem: Moderate Malnutrition Etiology: chronic illness (colon cancer) Signs/Symptoms: mild fat depletion, moderate fat depletion, mild muscle depletion, moderate muscle depletion Interventions: MVI, Glucerna shake, Premier Protein Appreciate nutrition help    Hypertension: Blood pressure stable.    Alcohol dependence ([place]): Patient has not been drinking since before previous hospitalization   Tobacco abuse: Nicotine patch   Gastro-esophageal reflux disease without esophagitis  Code Status: Full code  Family Communication: Left message for daughter  Disposition Plan: Discharge home with home health, if patient amenable, once hemoglobin stable, anticipate this weekend   Consultants: Orthopedic surgery  Procedures: Status post ORIF 11/16 Transfusion 1 unit packed red blood cells 11/17 and 11/18  Antimicrobials: Preop Ancef  DVT prophylaxis: Initially SCDs, changed to Eliquis.  Level of care: Telemetry Medical   Objective: Vitals:   01/02/21 0803 01/02/21 0844  BP: (!) 110/57 (!) 114/59  Pulse: 91 89  Resp: 14 16  Temp: 98.1 F (36.7 C) 98 F (36.7 C)  SpO2: 97% 100%    Intake/Output Summary (Last 24 hours) at 01/02/2021 0904 Last data filed at 01/02/2021 0600 Gross per 24 hour  Intake 2334.25 ml  Output 1500 ml  Net 834.25 ml    Filed Weights   12/30/20 1438  Weight: 58.5 kg   Body mass index is  20.2 kg/m.  Exam:  General: Alert and oriented x3, no acute distress HEENT: Normocephalic and atraumatic, mucous membranes slightly dry Cardiovascular: Regular rate and rhythm, S1-S2 Respiratory: Clear to auscultation bilaterally Abdomen: Soft, nontender, nondistended, positive bowel  sounds Musculoskeletal: No clubbing or cyanosis or edema Skin: No skin breaks, tears or lesions Psychiatry: Appropriate, no evidence of psychoses Neurology: No focal deficits   Data Reviewed: CBC: Recent Labs  Lab 12/30/20 1443 01/01/21 0459 01/02/21 0557  WBC 5.5 8.2 5.1  HGB 8.9* 6.5* 6.5*  HCT 27.0* 20.0* 19.9*  MCV 94.4 96.2 92.6  PLT 160 165 112*    Basic Metabolic Panel: Recent Labs  Lab 12/30/20 1443 01/01/21 0459 01/02/21 0557  NA 136 134* 134*  K 4.6 5.3* 4.3  CL 103 100 102  CO2 26 26 26   GLUCOSE 211* 163* 180*  BUN 25* 22 26*  CREATININE 1.15 1.40* 1.21  CALCIUM 8.9 8.1* 7.9*    GFR: Estimated Creatinine Clearance: 41 mL/min (by C-G formula based on SCr of 1.21 mg/dL). Liver Function Tests: No results for input(s): AST, ALT, ALKPHOS, BILITOT, PROT, ALBUMIN in the last 168 hours. No results for input(s): LIPASE, AMYLASE in the last 168 hours. No results for input(s): AMMONIA in the last 168 hours. Coagulation Profile: No results for input(s): INR, PROTIME in the last 168 hours. Cardiac Enzymes: No results for input(s): CKTOTAL, CKMB, CKMBINDEX, TROPONINI in the last 168 hours. BNP (last 3 results) No results for input(s): PROBNP in the last 8760 hours. HbA1C: No results for input(s): HGBA1C in the last 72 hours. CBG: Recent Labs  Lab 01/01/21 1547 01/01/21 1738 01/01/21 2017 01/02/21 0303 01/02/21 0804  GLUCAP 114* 165* 167* 224* 168*    Lipid Profile: No results for input(s): CHOL, HDL, LDLCALC, TRIG, CHOLHDL, LDLDIRECT in the last 72 hours. Thyroid Function Tests: No results for input(s): TSH, T4TOTAL, FREET4, T3FREE, THYROIDAB in the last 72 hours. Anemia Panel: No results for input(s): VITAMINB12, FOLATE, FERRITIN, TIBC, IRON, RETICCTPCT in the last 72 hours. Urine analysis: No results found for: COLORURINE, APPEARANCEUR, LABSPEC, PHURINE, GLUCOSEU, HGBUR, BILIRUBINUR, KETONESUR, PROTEINUR, UROBILINOGEN, NITRITE, LEUKOCYTESUR Sepsis  Labs: @LABRCNTIP (procalcitonin:4,lacticidven:4)  ) Recent Results (from the past 240 hour(s))  Resp Panel by RT-PCR (Flu A&B, Covid) Nasopharyngeal Swab     Status: Abnormal   Collection Time: 12/30/20  6:40 PM   Specimen: Nasopharyngeal Swab; Nasopharyngeal(NP) swabs in vial transport medium  Result Value Ref Range Status   SARS Coronavirus 2 by RT PCR POSITIVE (A) NEGATIVE Corrected    Comment: READ BACK AND VERIFIED BY Ladell Pier, RN AT 1940 12/30/20 BY [facility] RESULT CALLED TO, READ BACK BY AND VERIFIED WITH: (NOTE) SARS-CoV-2 target nucleic acids are DETECTED.  The SARS-CoV-2 RNA is generally detectable in upper respiratory specimens during the acute phase of infection. Positive results are indicative of the presence of the identified virus, but do not rule out bacterial infection or co-infection with other pathogens not detected by the test. Clinical correlation with patient history and other diagnostic information is necessary to determine patient infection status. The expected result is Negative.  Fact Sheet for Patients: EntrepreneurPulse.com.au  Fact Sheet for Healthcare Providers: IncredibleEmployment.be  This test is not yet approved or cleared by the [place] FDA and  has been authorized for detection and/or diagnosis of SARS-CoV-2 by FDA under an Emergency Use Authorization (EUA).  This EUA will remain  in effect (meaning this test can be used) for the duration of  the COVID-19 declaration under Section 564(b)(1)  of the Act, 21 U.S.C. section 360bbb-3(b)(1), unless the authorization is terminated or revoked sooner.  CORRECTED ON 11/16 AT 0759: PREVIOUSLY REPORTED AS POSITIVE READ BACK AND VERIFIED BY Ladell Pier, RN AT 1940 12/30/20 BY [facility]    Influenza A by PCR NEGATIVE NEGATIVE Final   Influenza B by PCR NEGATIVE NEGATIVE Final    Comment: (NOTE) The Xpert Xpress SARS-CoV-2/FLU/RSV plus assay is intended as an  aid in the diagnosis of influenza from Nasopharyngeal swab specimens and should not be used as a sole basis for treatment. Nasal washings and aspirates are unacceptable for Xpert Xpress SARS-CoV-2/FLU/RSV testing.  Fact Sheet for Patients: EntrepreneurPulse.com.au  Fact Sheet for Healthcare Providers: IncredibleEmployment.be  This test is not yet approved or cleared by the [place] FDA and has been authorized for detection and/or diagnosis of SARS-CoV-2 by FDA under an Emergency Use Authorization (EUA). This EUA will remain in effect (meaning this test can be used) for the duration of the COVID-19 declaration under Section 564(b)(1) of the Act, 21 U.S.C. section 360bbb-3(b)(1), unless the authorization is terminated or revoked.  Performed at [facility], [address], [place], [place] [address]   Surgical pcr screen     Status: Abnormal   Collection Time: 12/31/20 10:36 AM   Specimen: Nasal Mucosa; Nasal Swab  Result Value Ref Range Status   MRSA, PCR DETECTED (A) NEGATIVE Final    Comment: RESULT CALLED TO, READ BACK BY AND VERIFIED WITH: MATTHEW WRIGHT 12/31/20 1451 JGF    Staphylococcus aureus DETECTED (A) NEGATIVE Final    Comment: Performed at [facility], [place], [place] [address]  SARS CORONAVIRUS 2 (TAT 6-24 HRS) Nasopharyngeal Nasopharyngeal Swab     Status: Abnormal   Collection Time: 01/01/21  2:34 PM   Specimen: Nasopharyngeal Swab  Result Value Ref Range Status   SARS Coronavirus 2 POSITIVE (A) NEGATIVE Final    Comment: (NOTE) SARS-CoV-2 target nucleic acids are DETECTED.  The SARS-CoV-2 RNA is generally detectable in upper and lower respiratory specimens during the acute phase of infection. Positive results are indicative of the presence of SARS-CoV-2 RNA. Clinical correlation with patient history and other diagnostic information is  necessary to determine patient infection  status. Positive results do not rule out bacterial infection or co-infection with other viruses.  The expected result is Negative.  Fact Sheet for Patients: SugarRoll.be  Fact Sheet for Healthcare Providers: https://www.woods-mathews.com/  This test is not yet approved or cleared by the [place] FDA and  has been authorized for detection and/or diagnosis of SARS-CoV-2 by FDA under an Emergency Use Authorization (EUA). This EUA will remain  in effect (meaning this test can be used) for the duration of the COVID-19 declaration under Section 564(b)(1) of the Act, 21 U. S.C. section 360bbb-3(b)(1), unless the authorization is terminated or revoked sooner.   Performed at [facility], [place] [address], [place],  [address]        Studies: No results found.  Scheduled Meds:  sodium chloride   Intravenous Once   apixaban  5 mg Oral BID   atorvastatin  20 mg Oral QHS   docusate sodium  100 mg Oral BID   DULoxetine  20 mg Oral Daily   feeding supplement (GLUCERNA SHAKE)  237 mL Oral BID BM   insulin aspart  0-15 Units Subcutaneous TID WC   metFORMIN  500 mg Oral Q breakfast   multivitamin with minerals  1 tablet Oral Daily   nicotine  14 mg Transdermal Daily   pantoprazole  40 mg Oral Daily   Ensure Max Protein  11 oz Oral QHS   tamsulosin  0.4 mg Oral Daily   traMADol  50 mg Oral Q6H    Continuous Infusions:  sodium chloride 75 mL/hr at 01/02/21 0326   methocarbamol (ROBAXIN) IV       LOS: 3 days     Annita Brod, MD [facility]   01/02/2021, 9:04 AM

## 2021-01-02 NOTE — Progress Notes (Signed)
   Subjective: 2 Days Post-Op Procedure(s) (LRB): OPEN REDUCTION INTERNAL FIXATION (ORIF) DISTAL FEMUR FRACTURE - PERIPROSTHETIC FRACTURE (Left) Patient reports pain as mild.   Patient is well, and has had no acute complaints or problems Denies any CP, SOB, ABD pain. We will continue therapy today.  Plan is to go Home after hospital stay.  Objective: Vital signs in last 24 hours: Temp:  [98.1 F (36.7 C)-99.3 F (37.4 C)] 98.1 F (36.7 C) (11/18 0803) Pulse Rate:  [91-102] 91 (11/18 0803) Resp:  [14-18] 14 (11/18 0803) BP: (105-115)/(51-58) 110/57 (11/18 0803) SpO2:  [96 %-100 %] 97 % (11/18 0803)  Intake/Output from previous day: 11/17 0701 - 11/18 0700 In: 2334.3 [I.V.:2034.3; Blood:300] Out: 1500 [Urine:1500] Intake/Output this shift: No intake/output data recorded.  Recent Labs    12/30/20 1443 01/01/21 0459 01/02/21 0557  HGB 8.9* 6.5* 6.5*   Recent Labs    01/01/21 0459 01/02/21 0557  WBC 8.2 5.1  RBC 2.08* 2.15*  HCT 20.0* 19.9*  PLT 165 112*   Recent Labs    01/01/21 0459 01/02/21 0557  NA 134* 134*  K 5.3* 4.3  CL 100 102  CO2 26 26  BUN 22 26*  CREATININE 1.40* 1.21  GLUCOSE 163* 180*  CALCIUM 8.1* 7.9*   No results for input(s): LABPT, INR in the last 72 hours.  EXAM General - Patient is Alert, Appropriate, and Oriented Extremity - Neurovascular intact Sensation intact distally Intact pulses distally Dorsiflexion/Plantar flexion intact No cellulitis present Compartment soft Dressing - dressing C/D/I and no drainage Motor Function - intact, moving foot and toes well on exam.   Past Medical History:  Diagnosis Date   Diabetes mellitus without complication (HCC)    Hypertension     Assessment/Plan:   2 Days Post-Op Procedure(s) (LRB): OPEN REDUCTION INTERNAL FIXATION (ORIF) DISTAL FEMUR FRACTURE - PERIPROSTHETIC FRACTURE (Left) Principal Problem:   Fall at home, initial encounter Active Problems:   Closed left hip fracture,  initial encounter ([place])   Diabetes mellitus without complication ([place])   Chronic kidney disease, stage 3a ([place])   Hypertension   Alcohol dependence ([place])   Tobacco abuse   Gastro-esophageal reflux disease without esophagitis   Femur fracture, left (HCC)  Estimated body mass index is 20.2 kg/m as calculated from the following:   Height as of this encounter: 5\' 7"  (1.702 m).   Weight as of this encounter: 58.5 kg. Advance diet Up with therapy Pain well controlled Vital signs are stable Acute postop blood loss anemia, hemoglobin 6.5. s/p 1 unit PRBC 11/17,Transfuse 1 unit of packed red blood cells and recheck hemoglobin this afternoon. Recheck labs in the morning Care management to assist with discharge to SNF   Follow up with [place] ortho in 2 weeks   DVT Prophylaxis - TED hose and SCDs, Eliquis Weight-Bearing as tolerated to left leg   T. Rachelle Hora, PA-C [place] 01/02/2021, 8:12 AM

## 2021-01-02 NOTE — Progress Notes (Signed)
PT Cancellation Note  Patient Details Name: Corey Gonzalez MRN: [id number] DOB: 05/04/1940   Cancelled Treatment:     Therapist in to see pt this am, Hgb still remains too low for PT session, currently at 6.5 will check again this pm.   [name] 01/02/2021, 11:07 AM

## 2021-01-02 NOTE — TOC Progression Note (Signed)
Transition of Care (TOC) - Progression Note    Patient Details  Name: [name] MRN: [id number] Date of Birth: 10/07/1940  Transition of Care (TOC) CM/SW Contact  Deliliah J Louvet, RN Phone Number: 01/02/2021, 3:38 PM  Clinical Narrative:    Met with the patient to talk about the DC plan and needs  He was at [facility] and used his bed days that were free, he is aware of the copay and decided he would go home with Home health already set up with [facility], He has DME at home and will not additional,         Expected Discharge Plan and Services                                                 Social Determinants of Health (SDOH) Interventions    Readmission Risk Interventions No flowsheet data found.  

## 2021-01-02 NOTE — TOC Initial Note (Signed)
Transition of Care [facility]) - Initial/Assessment Note    Patient Details  Name: Corey Gonzalez MRN: [id number] Date of Birth: February 24, 1940  [facility] CM/SW Contact:    Conception Oms, RN Phone Number: 01/02/2021, 11:23 AM  Clinical Narrative:                 Patient was discharged from [facility] for 1 day and fell at home, He used 27 bed days and owes [facility] 1167.00, he will be in copay days and has to pay $2850.50 upfront to be able to return to SNF, He was set up with [facility] at DC from [facility], [facility] has had ithe past but they are not the current [facility] agency, He is set up with [facility] for [facility] at this time, He is considering going home, Anticipate DC over the weekend        Patient Goals and CMS Choice        Expected Discharge Plan and Services                                                Prior Living Arrangements/Services                       Activities of Daily Living Home Assistive Devices/Equipment: None ADL Screening (condition at time of admission) Patient's cognitive ability adequate to safely complete daily activities?: Yes Is the patient deaf or have difficulty hearing?: Yes Does the patient have difficulty seeing, even when wearing glasses/contacts?: No Does the patient have difficulty concentrating, remembering, or making decisions?: No Patient able to express need for assistance with ADLs?: Yes Does the patient have difficulty dressing or bathing?: No Independently performs ADLs?: No Communication: Independent Dressing (OT): Independent Grooming: Independent Feeding: Independent Bathing: Needs assistance Is this a change from baseline?: Pre-admission baseline Toileting: Needs assistance Is this a change from baseline?: Pre-admission baseline In/Out Bed: Needs assistance Is this a change from baseline?: Pre-admission baseline Walks in Home: Independent Does the patient have difficulty walking or climbing stairs?:  Yes Weakness of Legs: Left Weakness of Arms/Hands: None  Permission Sought/Granted                  Emotional Assessment              Admission diagnosis:  Surgery, elective [Z41.9] Fall [W19.XXXA] Falls infrequently [Z91.81] Femur fracture, left ([place]) [S72.92XA] Fall, initial encounter [W19.XXXA] Closed displaced spiral fracture of shaft of left femur, initial encounter ([place]) [S72.342A] Lab test positive for detection of COVID-19 virus [U07.1] Patient Active Problem List   Diagnosis Date Noted   Malnutrition of moderate degree 01/02/2021   Fall at home, initial encounter 12/30/2020   Tobacco abuse 12/30/2020   Gastro-esophageal reflux disease without esophagitis 12/30/2020   Femur fracture, left ([place]) 12/30/2020   Closed left hip fracture, initial encounter ([place]) 09/14/2019   Diabetes mellitus without complication ([place])    Chronic kidney disease, stage 3a ([place])    Hypertension    Alcohol dependence ([place])    PCP:  Pcp, No Pharmacy:   [facility], [place] [address] Phone: [phone] Fax: [phone]     Social Determinants of Health (SDOH) Interventions    Readmission Risk Interventions No flowsheet data found.

## 2021-01-03 LAB — TYPE AND SCREEN
ABO/RH(D): A POS
Antibody Screen: NEGATIVE
Unit division: 0
Unit division: 0

## 2021-01-03 LAB — GLUCOSE, CAPILLARY
Glucose-Capillary: 147 mg/dL — ABNORMAL HIGH (ref 70–99)
Glucose-Capillary: 153 mg/dL — ABNORMAL HIGH (ref 70–99)
Glucose-Capillary: 97 mg/dL (ref 70–99)
Glucose-Capillary: 195 mg/dL — ABNORMAL HIGH (ref 70–99)

## 2021-01-03 LAB — BPAM RBC
Blood Product Expiration Date: 202211222359
Blood Product Expiration Date: 202211242359
ISSUE DATE / TIME: 202211170855
ISSUE DATE / TIME: 202211181022
Unit Type and Rh: 600
Unit Type and Rh: 6200

## 2021-01-03 NOTE — Progress Notes (Signed)
Physical Therapy Treatment Patient Details Name: Corey Gonzalez MRN: [id number] DOB: [birth year] Today's Date: 01/03/2021   History of Present Illness Pt is a 80 y.o. male presenting to hospital 11/15 s/p fall (walking up a ramp and lost balance falling backwards); felt snap in L femur and unable to bear weight.  (+) COVID-19 11/15.  Recent discharge 11/14 from STR after colon resection in September.  S/p L ORIF distal femur fx/periprosthetic fx 12/31/20.  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    PT Comments    Pt was supine in bed upon arriving. He wa frustrated that Pryor Curia did not return sooner in the day to assist him back to bed. Unwilling to get OOB currently however was willing to participate in exercises in bed. See exercises listed below. Pt would greatly benefit from SNF at DC however  pt's out of rehab days so is planning to return home. Highly recommend 24/7 assistance at home at DC if he does decide not to go to rehab.   Recommendations for follow up therapy are one component of a multi-disciplinary discharge planning process, led by the attending physician.  Recommendations may be updated based on patient status, additional functional criteria and insurance authorization.  Follow Up Recommendations  Skilled nursing-short term rehab (<3 hours/day)     Assistance Recommended at Discharge Frequent or constant Supervision/Assistance  Equipment Recommendations  Rolling walker (2 wheels);BSC/3in1;Wheelchair (measurements PT);Wheelchair cushion (measurements PT)       Precautions / Restrictions Precautions Precautions: Fall Precaution Comments: LLQ colostomy Restrictions Weight Bearing Restrictions: Yes LLE Weight Bearing: Weight bearing as tolerated Other Position/Activity Restrictions: Per MD Rudene Christians 11/17: no L knee ROM restrictions     Mobility  Bed Mobility      General bed  mobility comments: pt was unwilling to get up OOB a 2nd time but was willing to participate in ther ex in bed. see exercises listed below.       Balance Overall balance assessment: Needs assistance Sitting-balance support: Bilateral upper extremity supported;Feet supported Sitting balance-Leahy Scale: Good     Standing balance support: Bilateral upper extremity supported;Reliant on assistive device for balance;During functional activity Standing balance-Leahy Scale: Fair      Cognition Arousal/Alertness: Awake/alert Behavior During Therapy: WFL for tasks assessed/performed Overall Cognitive Status: Within Functional Limits for tasks assessed      General Comments: Pt is A and O x 4. Pt is frustrated and unwilling to get back OOB. " I was already up once."        Exercises General Exercises - Lower Extremity Ankle Circles/Pumps: AROM;10 reps Quad Sets: AROM;10 reps Gluteal Sets: AROM;10 reps Short Arc Quad: 10 reps;AROM Long Arc Quad: AROM;10 reps Heel Slides: AROM;10 reps Hip ABduction/ADduction: AAROM;10 reps Straight Leg Raises: AAROM;10 reps    General Comments General comments (skin integrity, edema, etc.): HEP issued and pt able to perform.      Pertinent Vitals/Pain Pain Assessment: 0-10 Pain Score: 7  Pain Location: L mid/distal thigh Pain Descriptors / Indicators: Aching;Sore;Tender Pain Intervention(s): Limited activity within patient's tolerance;Monitored during session;Premedicated before session;Repositioned     PT Goals (current goals can now be found in the care plan section) Acute Rehab PT Goals Patient Stated Goal: to go home when I'm ready Progress towards PT goals: Progressing toward goals    Frequency    BID      PT Plan Current plan remains appropriate  AM-PAC PT "6 Clicks" Mobility   Outcome Measure  Help needed turning from your back to your side while in a flat bed without using bedrails?: A Little Help needed moving from  lying on your back to sitting on the side of a flat bed without using bedrails?: A Lot Help needed moving to and from a bed to a chair (including a wheelchair)?: A Lot Help needed standing up from a chair using your arms (e.g., wheelchair or bedside chair)?: A Lot Help needed to walk in hospital room?: A Little Help needed climbing 3-5 steps with a railing? : Total 6 Click Score: 13    End of Session Equipment Utilized During Treatment: Gait belt Activity Tolerance: Patient limited by pain Patient left: in bed;with call bell/phone within reach;with bed alarm set Nurse Communication: Mobility status PT Visit Diagnosis: Other abnormalities of gait and mobility (R26.89);Muscle weakness (generalized) (M62.81);History of falling (Z91.81);Pain Pain - Right/Left: Left Pain - part of body: Hip     Time: 8016-5537 PT Time Calculation (min) (ACUTE ONLY): 13 min  Charges:  $Therapeutic Exercise: 8-22 mins                     Julaine Fusi PTA 01/03/21, 3:25 PM

## 2021-01-03 NOTE — Progress Notes (Signed)
Physical Therapy Treatment Patient Details Name: Corey Gonzalez MRN: [id number] DOB: June 10, 1940 Today's Date: 01/03/2021   History of Present Illness Pt is a 80 y.o. male presenting to hospital 11/15 s/p fall (walking up a ramp and lost balance falling backwards); felt snap in L femur and unable to bear weight.  (+) COVID-19 11/15.  Recent discharge 11/14 from STR after colon resection in September.  S/p L ORIF distal femur fx/periprosthetic fx 12/31/20.  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    PT Comments    Pt was long sitting in bed upon arriving. He is A and O x 4 and cooperative throughout. Session severely limited by pain but pt gives great effort throughout. He required extensive assistance to exit bed due to pain. Stood form elevated bed height to RW and took several steps to recliner. Pt continues to require a lot of assistance throughout. He requested Pryor Curia call friend Lawrence Santiago for update and to discuss post hospital care) Author left message for return call. Pt does not want to pay for out of pocket STR at DC. Currently unsafe to DC home due to significant other having recent CVA and will not be able to provide level of care he will require currently. Acute PT recommends DC to rehab but if pt goes home would benefit from 24/7 assistance.   Recommendations for follow up therapy are one component of a multi-disciplinary discharge planning process, led by the attending physician.  Recommendations may be updated based on patient status, additional functional criteria and insurance authorization.  Follow Up Recommendations  Skilled nursing-short term rehab (<3 hours/day)     Assistance Recommended at Discharge Frequent or constant Supervision/Assistance  Equipment Recommendations  Rolling walker (2 wheels);BSC/3in1;Wheelchair (measurements PT);Wheelchair cushion (measurements PT)        Precautions / Restrictions Precautions Precautions: Fall Precaution Comments: LLQ colostomy Restrictions Weight Bearing Restrictions: Yes LLE Weight Bearing: Weight bearing as tolerated Other Position/Activity Restrictions: Per MD Rudene Christians 11/17: no L knee ROM restrictions     Mobility  Bed Mobility Overal bed mobility: Needs Assistance Bed Mobility: Supine to Sit;Sit to Supine     Supine to sit: Mod assist;Max assist;HOB elevated     General bed mobility comments: Continues to require extensive assistance due to pain with exiting bed.    Transfers Overall transfer level: Needs assistance Equipment used: Rolling walker (2 wheels) Transfers: Sit to/from Stand Sit to Stand: Mod assist;From elevated surface           General transfer comment: Pt stood from elevated bed height with mod assist + max vcs. pain severely liiting    Ambulation/Gait Ambulation/Gait assistance: Min assist Gait Distance (Feet): 5 Feet Assistive device: Rolling walker (2 wheels) Gait Pattern/deviations: Step-to pattern;Antalgic       General Gait Details: Pt was able to ambulate ~ 5 ft to recliner with slow steady antalgic gait pattern. distance greatly limited by pain.     Balance Overall balance assessment: Needs assistance Sitting-balance support: Bilateral upper extremity supported;Feet supported Sitting balance-Leahy Scale: Good Sitting balance - Comments: no LOB while sitting EOB x >5 minutes. no dizziness or c/o lightheaded ness   Standing balance support: Bilateral upper extremity supported;Reliant on assistive device for balance;During functional activity Standing balance-Leahy Scale: Fair Standing balance comment: pain most limitining       Cognition Arousal/Alertness: Awake/alert Behavior During Therapy: WFL for tasks assessed/performed Overall Cognitive  Status: Within Functional Limits for tasks assessed      General Comments: Pt is A and O x 4. Post session requested I  reach out to friend Lawrence Santiago for update on care plan. Pts signifigant other had recent stroke and will not be able to provide level of assistance he will require at [place] comments (skin integrity, edema, etc.): will bring HEP handout in afternoon session      Pertinent Vitals/Pain Pain Assessment: 0-10 Pain Score: 7  Pain Location: L mid/distal thigh Pain Descriptors / Indicators: Aching;Sore;Tender Pain Intervention(s): Limited activity within patient's tolerance;Monitored during session;Premedicated before session;Repositioned     PT Goals (current goals can now be found in the care plan section) Acute Rehab PT Goals Patient Stated Goal: to go home Progress towards PT goals: Progressing toward goals    Frequency    BID      PT Plan Current plan remains appropriate       AM-PAC PT "6 Clicks" Mobility   Outcome Measure  Help needed turning from your back to your side while in a flat bed without using bedrails?: A Little Help needed moving from lying on your back to sitting on the side of a flat bed without using bedrails?: A Lot Help needed moving to and from a bed to a chair (including a wheelchair)?: A Lot Help needed standing up from a chair using your arms (e.g., wheelchair or bedside chair)?: A Lot Help needed to walk in hospital room?: A Little Help needed climbing 3-5 steps with a railing? : Total 6 Click Score: 13    End of Session Equipment Utilized During Treatment: Gait belt Activity Tolerance: Patient limited by pain Patient left: in chair;with call bell/phone within reach;with chair alarm set Nurse Communication: Mobility status PT Visit Diagnosis: Other abnormalities of gait and mobility (R26.89);Muscle weakness (generalized) (M62.81);History of falling (Z91.81);Pain Pain - Right/Left: Left Pain - part of body: Hip     Time: 0925-0955 PT Time Calculation (min) (ACUTE ONLY): 30 min  Charges:  $Gait Training:  8-22 mins $Therapeutic Activity: 8-22 mins                     Julaine Fusi PTA 01/03/21, 10:11 AM

## 2021-01-03 NOTE — Progress Notes (Signed)
   Subjective: 3 Days Post-Op Procedure(s) (LRB): OPEN REDUCTION INTERNAL FIXATION (ORIF) DISTAL FEMUR FRACTURE - PERIPROSTHETIC FRACTURE (Left) Patient reports pain as mild.   Patient is well, and has had no acute complaints or problems Denies any CP, SOB, ABD pain. We will continue therapy today.  Plan is to go Home after hospital stay.  Objective: Vital signs in last 24 hours: Temp:  [97.8 F (36.6 C)-98.9 F (37.2 C)] 97.9 F (36.6 C) (11/19 0514) Pulse Rate:  [88-101] 90 (11/19 0514) Resp:  [14-20] 19 (11/19 0514) BP: (104-129)/(51-78) 129/66 (11/19 0514) SpO2:  [96 %-100 %] 100 % (11/19 0514)  Intake/Output from previous day: 11/18 0701 - 11/19 0700 In: 1127.7 [I.V.:727.7; Blood:400] Out: 1800 [Urine:1500; Stool:300] Intake/Output this shift: No intake/output data recorded.  Recent Labs    01/01/21 0459 01/02/21 0557 01/02/21 1653  HGB 6.5* 6.5* 8.0*   Recent Labs    01/01/21 0459 01/02/21 0557 01/02/21 1653  WBC 8.2 5.1  --   RBC 2.08* 2.15*  --   HCT 20.0* 19.9* 24.3*  PLT 165 112*  --    Recent Labs    01/01/21 0459 01/02/21 0557  NA 134* 134*  K 5.3* 4.3  CL 100 102  CO2 26 26  BUN 22 26*  CREATININE 1.40* 1.21  GLUCOSE 163* 180*  CALCIUM 8.1* 7.9*   No results for input(s): LABPT, INR in the last 72 hours.  EXAM General - Patient is Alert, Appropriate, and Oriented Extremity - Neurovascular intact Sensation intact distally Intact pulses distally Dorsiflexion/Plantar flexion intact No cellulitis present Compartment soft Dressing - dressing C/D/I and no drainage Motor Function - intact, moving foot and toes well on exam.  Waiting on physical therapy.  Past Medical History:  Diagnosis Date   Diabetes mellitus without complication (HCC)    Hypertension     Assessment/Plan:   3 Days Post-Op Procedure(s) (LRB): OPEN REDUCTION INTERNAL FIXATION (ORIF) DISTAL FEMUR FRACTURE - PERIPROSTHETIC FRACTURE (Left) Principal Problem:   Fall  at home, initial encounter Active Problems:   Closed left hip fracture, initial encounter ([place])   Diabetes mellitus without complication ([place])   Chronic kidney disease, stage 3a ([place])   Hypertension   Alcohol dependence ([place])   Tobacco abuse   Gastro-esophageal reflux disease without esophagitis   Femur fracture, left (HCC)   Malnutrition of moderate degree  Estimated body mass index is 20.2 kg/m as calculated from the following:   Height as of this encounter: 5\' 7"  (1.702 m).   Weight as of this encounter: 58.5 kg. Advance diet Up with therapy Pain well controlled Vital signs are stable Acute postop blood loss anemia, hemoglobin 8.0.  Ready to begin physical therapy.  Feeling better this morning.  Care management to assist with discharge to SNF   Follow [facility] with [facility] in 2 weeks   DVT Prophylaxis - TED hose and SCDs, Eliquis Weight-Bearing as tolerated to left leg   Reche Dixon, PA-C [place] 01/03/2021, 7:44 AM

## 2021-01-03 NOTE — Progress Notes (Signed)
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  Also found to be incidentally COVID-positive.  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  Patient seen by orthopedic surgery and underwent ORIF on the morning of 11/16.  Over next 2 days, hemoglobin around 6.5, each day requiring 1 unit packed red blood cells, felt to be product of blood loss anemia during surgery and not further bleeding.  Hemoglobin up to 8 after.  Patient feeling better although still weak.  Denies any shortness of breath.  Patient did have blistering wound near site of surgery that ruptured with some blood and fluid.  Nursing placed absorbent dressing on this. Assessment/Plan: Principal Problem:   Fall at home, initial encounter causing closed left hip fracture: Status post ORIF.  PT recommending very short-term skilled rehab, less than 3 hours a day.  Unfortunately, with his COVID finding, he would need to be in isolation for at least another week.  Orthopedics feels he could get rehab at home.  In discussion with case management, patient has likely use of all of his skilled nursing days and therefore would have to pay out-of-pocket.  He will likely need to go home with maximum home health services.  Plan to discharge home with home health services once hemoglobin stabilized  Leg wound: We will recheck in morning after fluid has been absorbed.  May have been allergic contact reaction to tape?  Blood loss anemia from surgery: Follow-up on labs.  Hemoglobin appears more stable now    Diabetes mellitus without complication ([place]): N.p.o. and  every 6 hours sliding scale.   Chronic kidney disease, stage 3a ([place]): At baseline.  Covid 19 infection: Vital signs stable.  Patient was convinced that his initial lab was negative as he is not been around anyone and had had a recent negative COVID test at his previous rehab facility.  Rechecked yesterday and still COVID-positive.  Continue isolation.  No need for Remdisivir.  Nutrition Status: Nutrition Problem: Moderate Malnutrition Etiology: chronic illness (colon cancer) Signs/Symptoms: mild fat depletion, moderate fat depletion, mild muscle depletion, moderate muscle depletion Interventions: MVI, Glucerna shake, Premier Protein Appreciate nutrition help    Hypertension: Blood pressure stable.    Alcohol dependence ([place]): Patient has not been drinking since before previous hospitalization   Tobacco abuse: Nicotine patch   Gastro-esophageal reflux disease without esophagitis  Code Status: Full code  Family Communication: Left message for daughter  Disposition Plan: Plan to discharge home with home health in the next few days as patient's mobility improves, hemoglobin stable and safe to resume Eliquis   Consultants: Orthopedic surgery  Procedures: Status post ORIF 11/16 Transfusion 1 unit packed red blood cells 11/17 and 11/18  Antimicrobials: Preop Ancef  DVT prophylaxis: Initially SCDs, changed to Eliquis.  Level of care: Med-Surg   Objective: Vitals:   01/03/21 0826 01/03/21 1210  BP: 126/61 129/77  Pulse: 88 92  Resp: 15 18  Temp: 98.8 F (37.1 C) 97.9 F (36.6 C)  SpO2: 99% 99%    Intake/Output Summary (Last 24 hours) at 01/03/2021 1349 Last data filed at 01/03/2021 1227 Gross per 24 hour  Intake 1117.7 ml  Output 2300 ml  Net -1182.3 ml    Filed Weights   12/30/20 1438  Weight: 58.5 kg   Body mass index is 20.2 kg/m.  Exam:  General: Alert and oriented x3, no acute distress HEENT: Normocephalic and atraumatic, mucous membranes slightly  dry Cardiovascular: Regular rate and rhythm, S1-S2 Respiratory: Clear to auscultation bilaterally Abdomen: Soft, nontender, nondistended, positive bowel sounds Musculoskeletal: No clubbing or cyanosis or edema Skin: Leg wound as described above covered by absorbent dressing. Psychiatry: Appropriate, no evidence of psychoses Neurology: No focal deficits   Data Reviewed: CBC: Recent Labs  Lab 12/30/20 1443 01/01/21 0459 01/02/21 0557 01/02/21 1653  WBC 5.5 8.2 5.1  --   HGB 8.9* 6.5* 6.5* 8.0*  HCT 27.0* 20.0* 19.9* 24.3*  MCV 94.4 96.2 92.6  --   PLT 160 165 112*  --     Basic Metabolic Panel: Recent Labs  Lab 12/30/20 1443 01/01/21 0459 01/02/21 0557  NA 136 134* 134*  K 4.6 5.3* 4.3  CL 103 100 102  CO2 26 26 26   GLUCOSE 211* 163* 180*  BUN 25* 22 26*  CREATININE 1.15 1.40* 1.21  CALCIUM 8.9 8.1* 7.9*    GFR: Estimated Creatinine Clearance: 41 mL/min (by C-G formula based on SCr of 1.21 mg/dL). Liver Function Tests: No results for input(s): AST, ALT, ALKPHOS, BILITOT, PROT, ALBUMIN in the last 168 hours. No results for input(s): LIPASE, AMYLASE in the last 168 hours. No results for input(s): AMMONIA in the last 168 hours. Coagulation Profile: No results for input(s): INR, PROTIME in the last 168 hours. Cardiac Enzymes: No results for input(s): CKTOTAL, CKMB, CKMBINDEX, TROPONINI in the last 168 hours. BNP (last 3 results) No results for input(s): PROBNP in the last 8760 hours. HbA1C: No results for input(s): HGBA1C in the last 72 hours. CBG: Recent Labs  Lab 01/02/21 1209 01/02/21 1627 01/02/21 2025 01/03/21 0826 01/03/21 1220  GLUCAP 163* 163* 135* 147* 153*    Lipid Profile: No results for input(s): CHOL, HDL, LDLCALC, TRIG, CHOLHDL, LDLDIRECT in the last 72 hours. Thyroid Function Tests: No results for input(s): TSH, T4TOTAL, FREET4, T3FREE, THYROIDAB in the last 72 hours. Anemia Panel: No results for input(s): VITAMINB12, FOLATE, FERRITIN,  TIBC, IRON, RETICCTPCT in the last 72 hours. Urine analysis: No results found for: COLORURINE, APPEARANCEUR, LABSPEC, PHURINE, GLUCOSEU, HGBUR, BILIRUBINUR, KETONESUR, PROTEINUR, UROBILINOGEN, NITRITE, LEUKOCYTESUR Sepsis Labs: @LABRCNTIP (procalcitonin:4,lacticidven:4)  ) Recent Results (from the past 240 hour(s))  Resp Panel by RT-PCR (Flu A&B, Covid) Nasopharyngeal Swab     Status: Abnormal   Collection Time: 12/30/20  6:40 PM   Specimen: Nasopharyngeal Swab; Nasopharyngeal(NP) swabs in vial transport medium  Result Value Ref Range Status   SARS Coronavirus 2 by RT PCR POSITIVE (A) NEGATIVE Corrected    Comment: READ BACK AND VERIFIED BY Ladell Pier, RN AT 1940 12/30/20 BY [facility] RESULT CALLED TO, READ BACK BY AND VERIFIED WITH: (NOTE) SARS-CoV-2 target nucleic acids are DETECTED.  The SARS-CoV-2 RNA is generally detectable in upper respiratory specimens during the acute phase of infection. Positive results are indicative of the presence of the identified virus, but do not rule out bacterial infection or co-infection with other pathogens not detected by the test. Clinical correlation with patient history and other diagnostic information is necessary to determine patient infection status. The expected result is Negative.  Fact Sheet for Patients: EntrepreneurPulse.com.au  Fact Sheet for Healthcare Providers: IncredibleEmployment.be  This test is not yet approved or cleared by the [place]  States FDA and  has been authorized for detection and/or diagnosis of SARS-CoV-2 by FDA under an Emergency Use Authorization (EUA).  This EUA will remain  in effect (meaning this test can be used) for the duration of  the COVID-19 declaration under Section 564(b)(1) of the Act, 21 U.S.C. section 360bbb-3(b)(1), unless the authorization is terminated or revoked sooner.  CORRECTED ON 11/16 AT 0759: PREVIOUSLY REPORTED AS POSITIVE READ BACK AND VERIFIED BY  Ladell Pier, RN AT 1940 12/30/20 BY [facility]    Influenza A by PCR NEGATIVE NEGATIVE Final   Influenza B by PCR NEGATIVE NEGATIVE Final    Comment: (NOTE) The Xpert Xpress SARS-CoV-2/FLU/RSV plus assay is intended as an aid in the diagnosis of influenza from Nasopharyngeal swab specimens and should not be used as a sole basis for treatment. Nasal washings and aspirates are unacceptable for Xpert Xpress SARS-CoV-2/FLU/RSV testing.  Fact Sheet for Patients: EntrepreneurPulse.com.au  Fact Sheet for Healthcare Providers: IncredibleEmployment.be  This test is not yet approved or cleared by the [place] FDA and has been authorized for detection and/or diagnosis of SARS-CoV-2 by FDA under an Emergency Use Authorization (EUA). This EUA will remain in effect (meaning this test can be used) for the duration of the COVID-19 declaration under Section 564(b)(1) of the Act, 21 U.S.C. section 360bbb-3(b)(1), unless the authorization is terminated or revoked.  Performed at [facility], [address], [place], [place] [address]   Surgical pcr screen     Status: Abnormal   Collection Time: 12/31/20 10:36 AM   Specimen: Nasal Mucosa; Nasal Swab  Result Value Ref Range Status   MRSA, PCR DETECTED (A) NEGATIVE Final    Comment: RESULT CALLED TO, READ BACK BY AND VERIFIED WITH: MATTHEW WRIGHT 12/31/20 1451 J[facility]    Staphylococcus aureus DETECTED (A) NEGATIVE Final    Comment: Performed at [facility], [place], [place] [address]  SARS CORONAVIRUS 2 (TAT 6-24 HRS) Nasopharyngeal Nasopharyngeal Swab     Status: Abnormal   Collection Time: 01/01/21  2:34 PM   Specimen: Nasopharyngeal Swab  Result Value Ref Range Status   SARS Coronavirus 2 POSITIVE (A) NEGATIVE Final    Comment: (NOTE) SARS-CoV-2 target nucleic acids are DETECTED.  The SARS-CoV-2 RNA is generally detectable in upper and lower respiratory specimens during  the acute phase of infection. Positive results are indicative of the presence of SARS-CoV-2 RNA. Clinical correlation with patient history and other diagnostic information is  necessary to determine patient infection status. Positive results do not rule out bacterial infection or co-infection with other viruses.  The expected result is Negative.  Fact Sheet for Patients: SugarRoll.be  Fact Sheet for Healthcare Providers: https://www.woods-mathews.com/  This test is not yet approved or cleared by the [place] FDA and  has been authorized for detection and/or diagnosis of SARS-CoV-2 by FDA under an Emergency Use Authorization (EUA). This EUA will remain  in effect (meaning this test can be used) for the duration of the COVID-19 declaration under Section 564(b)(1) of the Act, 21 U. S.C. section 360bbb-3(b)(1), unless the authorization is terminated or revoked sooner.   Performed at [facility], [place] [address], [place], [place] [address]        Studies: No results found.  Scheduled Meds:  atorvastatin  20 mg Oral QHS   docusate sodium  100 mg Oral BID   DULoxetine  20 mg Oral Daily   feeding supplement (GLUCERNA SHAKE)  237 mL Oral BID BM  insulin aspart  0-15 Units Subcutaneous TID WC   metFORMIN  500 mg Oral Q breakfast   multivitamin with minerals  1 tablet Oral Daily   nicotine  14 mg Transdermal Daily   pantoprazole  40 mg Oral Daily   Ensure Max Protein  11 oz Oral QHS   tamsulosin  0.4 mg Oral Daily   traMADol  50 mg Oral Q6H    Continuous Infusions:  sodium chloride 75 mL/hr at 01/02/21 0326   methocarbamol (ROBAXIN) IV       LOS: 4 days     Annita Brod, MD [facility]   01/03/2021, 1:49 PM

## 2021-01-04 LAB — BASIC METABOLIC PANEL
Anion gap: 5 (ref 5–15)
BUN: 20 mg/dL (ref 8–23)
CO2: 26 mmol/L (ref 22–32)
Calcium: 8.3 mg/dL — ABNORMAL LOW (ref 8.9–10.3)
Chloride: 99 mmol/L (ref 98–111)
Creatinine, Ser: 0.93 mg/dL (ref 0.61–1.24)
GFR, Estimated: >60 mL/min (ref >60)
Glucose, Bld: 174 mg/dL — ABNORMAL HIGH (ref 70–99)
Potassium: 4.2 mmol/L (ref 3.5–5.1)
Sodium: 130 mmol/L — ABNORMAL LOW (ref 135–145)

## 2021-01-04 LAB — GLUCOSE, CAPILLARY
Glucose-Capillary: 176 mg/dL — ABNORMAL HIGH (ref 70–99)
Glucose-Capillary: 113 mg/dL — ABNORMAL HIGH (ref 70–99)
Glucose-Capillary: 168 mg/dL — ABNORMAL HIGH (ref 70–99)
Glucose-Capillary: 150 mg/dL — ABNORMAL HIGH (ref 70–99)

## 2021-01-04 LAB — CBC
HCT: 23.8 % — ABNORMAL LOW (ref 39.0–52.0)
Hemoglobin: 8 g/dL — ABNORMAL LOW (ref 13.0–17.0)
MCH: 30.5 pg (ref 26.0–34.0)
MCHC: 33.6 g/dL (ref 30.0–36.0)
MCV: 90.8 fL (ref 80.0–100.0)
Platelets: 150 10*3/uL (ref 150–400)
RBC: 2.62 MIL/uL — ABNORMAL LOW (ref 4.22–5.81)
RDW: 16.5 % — ABNORMAL HIGH (ref 11.5–15.5)
WBC: 5.3 10*3/uL (ref 4.0–10.5)
nRBC: 0 % (ref 0.0–0.2)

## 2021-01-04 MED ORDER — APIXABAN 5 MG PO TABS
5.0000 mg | ORAL_TABLET | Freq: Two times a day (BID) | ORAL | Status: DC
  Administered 2021-01-04 – 2021-01-05 (×2): 5 mg via ORAL
  Filled 2021-01-04 (×3): qty 1

## 2021-01-04 NOTE — Progress Notes (Signed)
Physical Therapy Treatment Patient Details Name: Corey Gonzalez MRN: [id number] DOB: [birth year] Today's Date: 01/04/2021   History of Present Illness Pt is a 80 y.o. male presenting to hospital 11/15 s/p fall (walking up a ramp and lost balance falling backwards); felt snap in L femur and unable to bear weight.  (+) COVID-19 11/15.  Recent discharge 11/14 from STR after colon resection in September.  S/p L ORIF distal femur fx/periprosthetic fx 12/31/20.  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    PT Comments    Pt ready for session.  OOB with min a x 1 and increased time and cues.  Steady EOB.  Stood with min/mod a x 1 and poor hand placements, pulling on walker.  Once standing he is able to take a few very poor steps to chair.  Stood a second time briefly but limited to AROM LLE in standing.  Discussed discharge plan.  Pt continues to state he will be going home and not rehab.  He is under the impression that he can stay in the hospital until he is ready to go home and not go to rehab.  Clarified typical transition.   Pt may need further education on process.   Recommendations for follow up therapy are one component of a multi-disciplinary discharge planning process, led by the attending physician.  Recommendations may be updated based on patient status, additional functional criteria and insurance authorization.  Follow Up Recommendations  Skilled nursing-short term rehab (<3 hours/day)     Assistance Recommended at Discharge Frequent or constant Supervision/Assistance  Equipment Recommendations  Rolling walker (2 wheels);BSC/3in1;Wheelchair (measurements PT);Wheelchair cushion (measurements PT)    Recommendations for Other Services       Precautions / Restrictions Precautions Precautions: Fall Precaution Comments: LLQ colostomy Restrictions Weight Bearing Restrictions: Yes LLE  Weight Bearing: Weight bearing as tolerated Other Position/Activity Restrictions: Per MD Rudene Christians 11/17: no L knee ROM restrictions     Mobility  Bed Mobility Overal bed mobility: Needs Assistance Bed Mobility: Supine to Sit;Sit to Supine     Supine to sit: Min assist          Transfers Overall transfer level: Needs assistance Equipment used: Rolling walker (2 wheels) Transfers: Sit to/from Stand Sit to Stand: Min assist;Mod assist                Ambulation/Gait Ambulation/Gait assistance: Min assist Gait Distance (Feet): 3 Feet Assistive device: Rolling walker (2 wheels) Gait Pattern/deviations: Step-to pattern;Antalgic Gait velocity: decreased         Stairs             Wheelchair Mobility    Modified Rankin (Stroke Patients Only)       Balance Overall balance assessment: Needs assistance Sitting-balance support: Bilateral upper extremity supported;Feet supported Sitting balance-Leahy Scale: Good     Standing balance support: Bilateral upper extremity supported;Reliant on assistive device for balance;During functional activity Standing balance-Leahy Scale: Fair                              Cognition Arousal/Alertness: Awake/alert Behavior During Therapy: WFL for tasks assessed/performed Overall Cognitive Status: Within Functional Limits for tasks assessed  Exercises Other Exercises Other Exercises: supine BLE A/AAROM    General Comments        Pertinent Vitals/Pain Pain Assessment: Faces Faces Pain Scale: Hurts little more Pain Location: L mid/distal thigh Pain Descriptors / Indicators: Aching;Sore;Tender Pain Intervention(s): Limited activity within patient's tolerance;Monitored during session;Premedicated before session;Repositioned    Home Living                          Prior Function            PT Goals (current goals can now be found in the  care plan section) Progress towards PT goals: Progressing toward goals    Frequency    BID      PT Plan Current plan remains appropriate    Co-evaluation              AM-PAC PT "6 Clicks" Mobility   Outcome Measure  Help needed turning from your back to your side while in a flat bed without using bedrails?: A Little Help needed moving from lying on your back to sitting on the side of a flat bed without using bedrails?: A Lot Help needed moving to and from a bed to a chair (including a wheelchair)?: A Lot Help needed standing up from a chair using your arms (e.g., wheelchair or bedside chair)?: A Lot Help needed to walk in hospital room?: A Lot Help needed climbing 3-5 steps with a railing? : Total 6 Click Score: 12    End of Session Equipment Utilized During Treatment: Gait belt Activity Tolerance: Patient limited by pain Patient left: in bed;with call bell/phone within reach;with bed alarm set Nurse Communication: Mobility status PT Visit Diagnosis: Other abnormalities of gait and mobility (R26.89);Muscle weakness (generalized) (M62.81);History of falling (Z91.81);Pain Pain - Right/Left: Left Pain - part of body: Hip     Time: 8563-1497 PT Time Calculation (min) (ACUTE ONLY): 24 min  Charges:  $Therapeutic Exercise: 8-22 mins $Therapeutic Activity: 8-22 mins                    Chesley Noon, PTA 01/04/21, 12:18 PM

## 2021-01-04 NOTE — Progress Notes (Signed)
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  Also found to be incidentally COVID-positive.  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  Patient seen by orthopedic surgery and underwent ORIF on the morning of 11/16.  Over next 2 days, hemoglobin around 6.5, each day requiring 1 unit packed red blood cells, felt to be product of blood loss anemia during surgery and not further bleeding.  Hemoglobin up to 8 after and still stable this morning.  Patient complains of generalized weakness and is concerned about going home.  Assessment/Plan: Principal Problem:   Fall at home, initial encounter causing closed left hip fracture: Status post ORIF.  PT recommending very short-term skilled rehab, less than 3 hours a day.  Unfortunately, with his COVID finding, he would need to be in isolation for at least another week.  Orthopedics feels he could get rehab at home.  In discussion with case management, patient has used up all of skilled nursing days from previous hospitalization and will have to pay out-of-pocket, which she does not have.  He may not have any other options other than discharge home with maximal home health services.  Leg wound: Noted to have some mild blistering near site of surgery.  Stable, continue wound care.  Blood loss anemia from surgery: Follow-up on labs.  Hemoglobin unchanged from 36 hours.    Diabetes mellitus without complication ([place]): Initially n.p.o., now with sliding scale.  CBGs staying under 200.    Chronic kidney disease, stage 3a ([place]): At baseline.  Covid 19 infection: Vital  signs stable.  Patient was convinced that his initial lab was negative as he is not been around anyone and had had a recent negative COVID test at his previous rehab facility.  Rechecked on 11/18 and still COVID-positive.  Continue isolation.  No need for Remdisivir.  Nutrition Status: Nutrition Problem: Moderate Malnutrition Etiology: chronic illness (colon cancer) Signs/Symptoms: mild fat depletion, moderate fat depletion, mild muscle depletion, moderate muscle depletion Interventions: MVI, Glucerna shake, Premier Protein Appreciate nutrition help    Hypertension: Blood pressure stable.    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   Consultants: Orthopedic surgery  Procedures: Status post ORIF 11/16 Transfusion 1 unit packed red blood cells 11/17 and 11/18  Antimicrobials: Preop Ancef  DVT prophylaxis: Initially SCDs, changed to Eliquis.  Level of care: Med-Surg   Objective: Vitals:   01/04/21 0319 01/04/21 0818  BP: 123/64 122/70  Pulse: 90 90  Resp: 20 20  Temp: 98 F (36.7 C) 98.1 F (36.7 C)  SpO2: 100% 100%    Intake/Output Summary (Last 24 hours) at 01/04/2021 1448 Last data filed at 01/04/2021 0321 Gross per 24 hour  Intake --  Output 1450 ml  Net -1450 ml    Filed Weights   12/30/20 1438  Weight: 58.5 kg   Body mass index is 20.2 kg/m.  Exam:  General: Alert and oriented x3, no acute distress HEENT: Normocephalic and atraumatic, mucous membranes slightly dry Cardiovascular: Regular rate and rhythm, S1-S2 Respiratory: Clear to auscultation bilaterally Abdomen: Soft, nontender, nondistended, positive bowel sounds Musculoskeletal: No clubbing or cyanosis or  edema Skin: Leg wound as described above covered by absorbent dressing. Psychiatry: Appropriate, no evidence of psychoses Neurology: No focal deficits   Data Reviewed: CBC: Recent Labs  Lab 12/30/20 1443 01/01/21 0459 01/02/21 0557 01/02/21 1653 01/04/21 0530  WBC 5.5 8.2 5.1  --  5.3  HGB 8.9* 6.5* 6.5* 8.0* 8.0*  HCT 27.0* 20.0* 19.9* 24.3* 23.8*  MCV 94.4 96.2 92.6  --  90.8  PLT 160 165 112*  --  413    Basic Metabolic Panel: Recent Labs  Lab 12/30/20 1443 01/01/21 0459 01/02/21 0557 01/04/21 0530  NA 136 134* 134* 130*  K 4.6 5.3* 4.3 4.2  CL 103 100 102 99  CO2 26 26 26 26   GLUCOSE 211* 163* 180* 174*  BUN 25* 22 26* 20  CREATININE 1.15 1.40* 1.21 0.93  CALCIUM 8.9 8.1* 7.9* 8.3*    GFR: Estimated Creatinine Clearance: 53.3 mL/min (by C-G formula based on SCr of 0.93 mg/dL). Liver Function Tests: No results for input(s): AST, ALT, ALKPHOS, BILITOT, PROT, ALBUMIN in the last 168 hours. No results for input(s): LIPASE, AMYLASE in the last 168 hours. No results for input(s): AMMONIA in the last 168 hours. Coagulation Profile: No results for input(s): INR, PROTIME in the last 168 hours. Cardiac Enzymes: No results for input(s): CKTOTAL, CKMB, CKMBINDEX, TROPONINI in the last 168 hours. BNP (last 3 results) No results for input(s): PROBNP in the last 8760 hours. HbA1C: No results for input(s): HGBA1C in the last 72 hours. CBG: Recent Labs  Lab 01/03/21 1220 01/03/21 1626 01/03/21 2006 01/04/21 0819 01/04/21 1221  GLUCAP 153* 97 195* 176* 113*    Lipid Profile: No results for input(s): CHOL, HDL, LDLCALC, TRIG, CHOLHDL, LDLDIRECT in the last 72 hours. Thyroid Function Tests: No results for input(s): TSH, T4TOTAL, FREET4, T3FREE, THYROIDAB in the last 72 hours. Anemia Panel: No results for input(s): VITAMINB12, FOLATE, FERRITIN, TIBC, IRON, RETICCTPCT in the last 72 hours. Urine analysis: No results found for: COLORURINE, APPEARANCEUR, LABSPEC,  PHURINE, GLUCOSEU, HGBUR, BILIRUBINUR, KETONESUR, PROTEINUR, UROBILINOGEN, NITRITE, LEUKOCYTESUR Sepsis Labs: @LABRCNTIP (procalcitonin:4,lacticidven:4)  ) Recent Results (from the past 240 hour(s))  Resp Panel by RT-PCR (Flu A&B, Covid) Nasopharyngeal Swab     Status: Abnormal   Collection Time: 12/30/20  6:40 PM   Specimen: Nasopharyngeal Swab; Nasopharyngeal(NP) swabs in vial transport medium  Result Value Ref Range Status   SARS Coronavirus 2 by RT PCR POSITIVE (A) NEGATIVE Corrected    Comment: READ BACK AND VERIFIED BY Ladell Pier, RN AT 1940 12/30/20 BY [facility] RESULT CALLED TO, READ BACK BY AND VERIFIED WITH: (NOTE) SARS-CoV-2 target nucleic acids are DETECTED.  The SARS-CoV-2 RNA is generally detectable in upper respiratory specimens during the acute phase of infection. Positive results are indicative of the presence of the identified virus, but do not rule out bacterial infection or co-infection with other pathogens not detected by the test. Clinical correlation with patient history and other diagnostic information is necessary to determine patient infection status. The expected result is Negative.  Fact Sheet for Patients: EntrepreneurPulse.com.au  Fact Sheet for Healthcare Providers: IncredibleEmployment.be  This test is not yet approved or cleared by the [place] FDA and  has been authorized for detection and/or diagnosis of SARS-CoV-2 by FDA under an Emergency  Use Authorization (EUA).  This EUA will remain  in effect (meaning this test can be used) for the duration of  the COVID-19 declaration under Section 564(b)(1) of the Act, 21 U.S.C. section 360bbb-3(b)(1), unless the authorization is terminated or revoked sooner.  CORRECTED ON 11/16 AT 0759: PREVIOUSLY REPORTED AS POSITIVE READ BACK AND VERIFIED BY Ladell Pier, RN AT 1940 12/30/20 BY [facility]    Influenza A by PCR NEGATIVE NEGATIVE Final   Influenza B by PCR NEGATIVE  NEGATIVE Final    Comment: (NOTE) The Xpert Xpress SARS-CoV-2/FLU/RSV plus assay is intended as an aid in the diagnosis of influenza from Nasopharyngeal swab specimens and should not be used as a sole basis for treatment. Nasal washings and aspirates are unacceptable for Xpert Xpress SARS-CoV-2/FLU/RSV testing.  Fact Sheet for Patients: EntrepreneurPulse.com.au  Fact Sheet for Healthcare Providers: IncredibleEmployment.be  This test is not yet approved or cleared by the [place] FDA and has been authorized for detection and/or diagnosis of SARS-CoV-2 by FDA under an Emergency Use Authorization (EUA). This EUA will remain in effect (meaning this test can be used) for the duration of the COVID-19 declaration under Section 564(b)(1) of the Act, 21 U.S.C. section 360bbb-3(b)(1), unless the authorization is terminated or revoked.  Performed at [facility], [address] [place], [place] [address]   Surgical pcr screen     Status: Abnormal   Collection Time: 12/31/20 10:36 AM   Specimen: Nasal Mucosa; Nasal Swab  Result Value Ref Range Status   MRSA, PCR DETECTED (A) NEGATIVE Final    Comment: RESULT CALLED TO, READ BACK BY AND VERIFIED WITH: MATTHEW WRIGHT 12/31/20 1451 [facility]    Staphylococcus aureus DETECTED (A) NEGATIVE Final    Comment: Performed at [facility], [place], [place] [address]  SARS CORONAVIRUS 2 (TAT 6-24 HRS) Nasopharyngeal Nasopharyngeal Swab     Status: Abnormal   Collection Time: 01/01/21  2:34 PM   Specimen: Nasopharyngeal Swab  Result Value Ref Range Status   SARS Coronavirus 2 POSITIVE (A) NEGATIVE Final    Comment: (NOTE) SARS-CoV-2 target nucleic acids are DETECTED.  The SARS-CoV-2 RNA is generally detectable in upper and lower respiratory specimens during the acute phase of infection. Positive results are indicative of the presence of SARS-CoV-2 RNA. Clinical correlation  with patient history and other diagnostic information is  necessary to determine patient infection status. Positive results do not rule out bacterial infection or co-infection with other viruses.  The expected result is Negative.  Fact Sheet for Patients: SugarRoll.be  Fact Sheet for Healthcare Providers: https://www.woods-mathews.com/  This test is not yet approved or cleared by the [place] FDA and  has been authorized for detection and/or diagnosis of SARS-CoV-2 by FDA under an Emergency Use Authorization (EUA). This EUA will remain  in effect (meaning this test can be used) for the duration of the COVID-19 declaration under Section 564(b)(1) of the Act, 21 U. S.C. section 360bbb-3(b)(1), unless the authorization is terminated or revoked sooner.   Performed at [facility], [place] [address] [place], [place] [address]        Studies: No results found.  Scheduled Meds:  atorvastatin  20 mg Oral QHS   docusate sodium  100 mg Oral BID   DULoxetine  20 mg Oral Daily   feeding supplement (GLUCERNA SHAKE)  237 mL Oral BID BM   insulin aspart  0-15 Units Subcutaneous TID WC   metFORMIN  500 mg Oral Q breakfast  multivitamin with minerals  1 tablet Oral Daily   nicotine  14 mg Transdermal Daily   pantoprazole  40 mg Oral Daily   Ensure Max Protein  11 oz Oral QHS   tamsulosin  0.4 mg Oral Daily   traMADol  50 mg Oral Q6H    Continuous Infusions:  sodium chloride 75 mL/hr at 01/02/21 0326   methocarbamol (ROBAXIN) IV       LOS: 5 days     Annita Brod, MD [facility]   01/04/2021, 2:48 PM

## 2021-01-04 NOTE — Progress Notes (Addendum)
   Subjective: 4 Days Post-Op Procedure(s) (LRB): OPEN REDUCTION INTERNAL FIXATION (ORIF) DISTAL FEMUR FRACTURE - PERIPROSTHETIC FRACTURE (Left) Patient reports pain as mild.   Patient is well, and has had no acute complaints or problems Denies any CP, SOB, ABD pain. We will continue therapy today.  Plan is to go Home after hospital stay.  Objective: Vital signs in last 24 hours: Temp:  [97.9 F (36.6 C)-98.8 F (37.1 C)] 98 F (36.7 C) (11/20 0319) Pulse Rate:  [83-95] 90 (11/20 0319) Resp:  [15-20] 20 (11/20 0319) BP: (112-131)/(60-77) 123/64 (11/20 0319) SpO2:  [99 %-100 %] 100 % (11/20 0319)  Intake/Output from previous day: 11/19 0701 - 11/20 0700 In: -  Out: 1950 [Urine:1350; Stool:600] Intake/Output this shift: No intake/output data recorded.  Recent Labs    01/02/21 0557 01/02/21 1653 01/04/21 0530  HGB 6.5* 8.0* 8.0*   Recent Labs    01/02/21 0557 01/02/21 1653 01/04/21 0530  WBC 5.1  --  5.3  RBC 2.15*  --  2.62*  HCT 19.9* 24.3* 23.8*  PLT 112*  --  150   Recent Labs    01/02/21 0557 01/04/21 0530  NA 134* 130*  K 4.3 4.2  CL 102 99  CO2 26 26  BUN 26* 20  CREATININE 1.21 0.93  GLUCOSE 180* 174*  CALCIUM 7.9* 8.3*   No results for input(s): LABPT, INR in the last 72 hours.  EXAM General - Patient is Alert, Appropriate, and Oriented Extremity - Neurovascular intact Sensation intact distally Intact pulses distally Dorsiflexion/Plantar flexion intact No cellulitis present Compartment soft Dressing - dressing C/D/I and no drainage.  Mild blistering involving the proximal and distal femoral shaft with no drainage. Motor Function - intact, moving foot and toes well on exam.  Ambulated 5 feet with physical therapy.  Up to the chair.  Past Medical History:  Diagnosis Date   Diabetes mellitus without complication ([facility])    Hypertension     Assessment/Plan:   4 Days Post-Op Procedure(s) (LRB): OPEN REDUCTION INTERNAL FIXATION (ORIF)  DISTAL FEMUR FRACTURE - PERIPROSTHETIC FRACTURE (Left) Principal Problem:   Fall at home, initial encounter Active Problems:   Closed left hip fracture, initial encounter ([place])   Diabetes mellitus without complication ([place])   Chronic kidney disease, stage 3a ([place])   Hypertension   Alcohol dependence ([place])   Tobacco abuse   Gastro-esophageal reflux disease without esophagitis   Femur fracture, left (H[facility])   Malnutrition of moderate degree  Estimated body mass index is 20.2 kg/m as calculated from the following:   Height as of this encounter: 5\' 7"  (1.702 m).   Weight as of this encounter: 58.5 kg. Advance diet Up with therapy Pain well controlled Vital signs are stable Acute postop blood loss anemia, hemoglobin 8.0.  Feeling better this morning.  Care management to assist with discharge home with home health physical therapy.   Follow up with [facility] ortho in 2 weeks   DVT Prophylaxis - TED hose and SCDs, Eliquis Weight-Bearing as tolerated to left leg   Reche Dixon, PA-C [place] 01/04/2021, 7:26 AM

## 2021-01-05 DIAGNOSIS — U071 COVID-19: Secondary | ICD-10-CM | POA: Diagnosis present

## 2021-01-05 LAB — GLUCOSE, CAPILLARY
Glucose-Capillary: 109 mg/dL — ABNORMAL HIGH (ref 70–99)
Glucose-Capillary: 179 mg/dL — ABNORMAL HIGH (ref 70–99)

## 2021-01-05 MED ORDER — METHOCARBAMOL 500 MG PO TABS: 500.0000 mg | ORAL_TABLET | Freq: Four times a day (QID) | ORAL | 1 refills | Status: DC | PRN

## 2021-01-05 MED ORDER — GLUCERNA SHAKE PO LIQD: 237.0000 mL | Freq: Two times a day (BID) | ORAL | 0 refills | Status: DC

## 2021-01-05 MED ORDER — ENSURE MAX PROTEIN PO LIQD: 11.0000 [oz_av] | Freq: Every day | ORAL | Status: DC

## 2021-01-05 NOTE — TOC Progression Note (Signed)
Transition of Care [facility]) - Progression Note    Patient Details  Name: Corey Gonzalez MRN: [id number] Date of Birth: 1940/06/28  Transition of Care [facility]) CM/SW [place], RN Phone Number: 01/05/2021, 3:54 PM  Clinical Narrative:   EMS has been called to transport the patient home         Expected Discharge Plan and Services           Expected Discharge Date: 01/05/21                                     Social Determinants of Health (SDOH) Interventions    Readmission Risk Interventions No flowsheet data found.

## 2021-01-05 NOTE — Progress Notes (Signed)
Physical Therapy Treatment Patient Details Name: Corey Gonzalez MRN: [id number] DOB: 12/28/1940 Today's Date: 01/05/2021   History of Present Illness Pt is a 80 y.o. male presenting to hospital 11/15 s/p fall (walking up a ramp and lost balance falling backwards); felt snap in L femur and unable to bear weight.  (+) COVID-19 11/15.  Recent discharge 11/14 from [facility] after colon resection in September.  S/p L ORIF distal femur fx/periprosthetic fx 12/31/20.  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    PT Comments    Participated in exercises as described below.  To EOB with min a x 1 for LE management.  He is able to stand from raised bed with min a x 1 and take several short shuffling steps to recliner at bedside.  He is fatigued with task and did not feel like he could progress gait further.  Pt remains resistant to SNF.  While discharge home at this time is not recommended he chooses to do so.  He will need +1 assist at all times for mobility and a wheelchair is recommended for home as he is unable to walk household distances or access in/out of his home.  EMS transport is recommended if available.   Patient suffers from femur fracture which impairs his/her ability to perform daily activities like toileting, feeding, dressing, grooming, bathing in the home. A cane, walker, crutch will not resolve the patient's issue with performing activities of daily living. A lightweight wheelchair and cushion is required/recommended and will allow patient to safely perform daily activities.   Patient can safely propel the wheelchair in the home or has a caregiver who can provide assistance.    Recommendations for follow up therapy are one component of a multi-disciplinary discharge planning process, led by the attending physician.  Recommendations may be updated based on patient status, additional functional  criteria and insurance authorization.  Follow Up Recommendations  Skilled nursing-short term rehab (<3 hours/day)     Assistance Recommended at Discharge Frequent or constant Supervision/Assistance  Equipment Recommendations  Rolling walker (2 wheels);BSC/3in1;Wheelchair (measurements PT);Wheelchair cushion (measurements PT)    Recommendations for Other Services       Precautions / Restrictions Precautions Precautions: Fall Precaution Comments: LLQ colostomy Restrictions Weight Bearing Restrictions: Yes LLE Weight Bearing: Weight bearing as tolerated Other Position/Activity Restrictions: Per MD Rudene Christians 11/17: no L knee ROM restrictions     Mobility  Bed Mobility Overal bed mobility: Needs Assistance Bed Mobility: Supine to Sit     Supine to sit: Min assist     General bed mobility comments: assist for LE mobility    Transfers Overall transfer level: Needs assistance Equipment used: Rolling walker (2 wheels) Transfers: Sit to/from Stand Sit to Stand: Min assist;From elevated surface                Ambulation/Gait Ambulation/Gait assistance: Min Web designer (Feet): 3 Feet Assistive device: Rolling walker (2 wheels) Gait Pattern/deviations: Step-to pattern;Antalgic Gait velocity: decreased     General Gait Details: Gait limited due to pain   Stairs             Wheelchair Mobility    Modified Rankin (Stroke Patients Only)       Balance Overall balance assessment: Needs assistance Sitting-balance support: Bilateral upper extremity supported;Feet supported Sitting balance-Leahy Scale: Good     Standing balance support: Bilateral upper extremity supported;Reliant on assistive device  for balance;During functional activity Standing balance-Leahy Scale: Fair Standing balance comment: pain most limitining                            Cognition Arousal/Alertness: Awake/alert Behavior During Therapy: WFL for tasks  assessed/performed Overall Cognitive Status: Within Functional Limits for tasks assessed                                          Exercises General Exercises - Lower Extremity Ankle Circles/Pumps: AROM;10 reps Quad Sets: AROM;10 reps Gluteal Sets: AROM;10 reps Short Arc Quad: 10 reps;AROM Long Arc Quad: AROM;10 reps Heel Slides: AROM;10 reps Hip ABduction/ADduction: AAROM;10 reps Straight Leg Raises: AAROM;10 reps    General Comments        Pertinent Vitals/Pain Pain Assessment: Faces Faces Pain Scale: Hurts little more Pain Location: L mid/distal thigh Pain Descriptors / Indicators: Aching;Sore;Tender Pain Intervention(s): Limited activity within patient's tolerance;Monitored during session;Premedicated before session;Repositioned    Home Living                          Prior Function            PT Goals (current goals can now be found in the care plan section) Progress towards PT goals: Progressing toward goals    Frequency    BID      PT Plan Current plan remains appropriate    Co-evaluation              AM-PAC PT "6 Clicks" Mobility   Outcome Measure  Help needed turning from your back to your side while in a flat bed without using bedrails?: A Little Help needed moving from lying on your back to sitting on the side of a flat bed without using bedrails?: A Lot Help needed moving to and from a bed to a chair (including a wheelchair)?: A Lot Help needed standing up from a chair using your arms (e.g., wheelchair or bedside chair)?: A Lot Help needed to walk in hospital room?: A Lot Help needed climbing 3-5 steps with a railing? : Total 6 Click Score: 12    End of Session Equipment Utilized During Treatment: Gait belt Activity Tolerance: Patient limited by pain Patient left: in bed;with call bell/phone within reach;with bed alarm set Nurse Communication: Mobility status PT Visit Diagnosis: Other abnormalities of gait  and mobility (R26.89);Muscle weakness (generalized) (M62.81);History of falling (Z91.81);Pain Pain - Right/Left: Left Pain - part of body: Hip     Time: 9485-4627 PT Time Calculation (min) (ACUTE ONLY): 25 min  Charges:  $Therapeutic Exercise: 8-22 mins $Therapeutic Activity: 8-22 mins                    Chesley Noon, PTA 01/05/21, 12:40 PM

## 2021-01-05 NOTE — Progress Notes (Signed)
PT AVS reviewed and pt verbalized understanding of all teaching. IV removed without complications. Pt will be leaving with EMS as transportation.

## 2021-01-05 NOTE — TOC Progression Note (Addendum)
Transition of Care [facility]) - Progression Note    Patient Details  Name: Corey Gonzalez MRN: [id number] Date of Birth: 09/23/1940  Transition of Care [employer]) CM/SW May, RN Phone Number: 01/05/2021, 12:02 PM  Clinical Narrative:    Spoke With the patient, He is set up with Home health services from Previous visit, He will be getting a wheelchair delivered to his room and he has a BSC at home, He stated that he needs EMS to transport, I explained that he is not able to stay in the hospital for rehab and reminded him that he has used all of his free days and to go to STR he has to pay up front before they will accept him,  he stated that he is not able to do that and will go home with [facility] and WC and EMS to transport  Update, The patient doe snot have coverage for a wheelchair and it will be out of pocket pay from [employer],  We received a call from his sister with a VM asking to call her back, I reached out to the patient to gain permission to speak to his sister, He did not answer the phone, [employer] was not able to get an answer to discuss the cost of the wheelchair either,  I went into the room, he stated that he knew someone was trying to call and he was going to call back, he stated that he lost his phone but now has found it. I called [employer] to speak about the wheelchair  He provided permission to call his sister Corey Gonzalez I called her and she stated that it is pointless now to speak to me and thanked me for calling then hung up    The patient spoke with [employer] for DME and stated that he will use his Finance's wheelchair that she has at home that she is not using and he does not want to pay out of pocket for one of his own, He will not be provided with a wheelchair prior to DC  Update, The patient has decided he does want the Wheelchair from [employer], They are working out the price with him and will deliver to his home,   Expected Discharge Plan and Services                                                  Social Determinants of Health (SDOH) Interventions    Readmission Risk Interventions No flowsheet data found.

## 2021-01-05 NOTE — Progress Notes (Signed)
PT Cancellation Note  Patient Details Name: Corey Gonzalez MRN: [id number] DOB: Jan 24, 1941   Cancelled Treatment:    Reason Eval/Treat Not Completed: Other (comment)  Returned for BID session.  Pt back to bed with nursing prior to arrival.  Pt awaiting EMS transport home.  Reviewed use of gait belt and +1 assist for all mobility.  Pt stated he does not have physical assist at home and will be "very careful" when trying to get up.  Re-enforced safety and high fall risk.  Stated he will use urinal and has colostomy bag for BM's.  Pt remains high fall risk and risk for re-admission.  Pt aware of risks but continues to choose home vs SNF.   Chesley Noon 01/05/2021, 2:11 PM

## 2021-01-05 NOTE — Progress Notes (Signed)
Patient suffers from impaired mobility which impairs their ability to perform daily activities like ADLs in the home.  A walking aide will not resolve issue with performing activities of daily living. A wheelchair will allow patient to safely perform daily activities. Patient is not able to propel themselves in the home using a standard weight wheelchair due to weakness. Patient can self propel in the lightweight wheelchair. Length of need  6 months. Accessories: elevating leg rests (ELRs), wheel locks, extensions and anti-tippers. Back Cushion

## 2021-01-05 NOTE — Discharge Summary (Signed)
Discharge Summary  Corey Gonzalez KCL:[id number] DOB: [birth year]  PCP: Pcp, No  Admit date: 12/30/2020 Discharge date: 01/05/2021  Time spent: 25 minutes  Recommendations for Outpatient Follow-up:  Patient will be discharged with home health PT, OT and aide Patient will follow up with [facility] in 2 weeks. New medication: As needed Vicodin 5/325 every 6 hours for moderate pain New medication: Robaxin 500 mg every 6 hours as needed for muscle spasms  Discharge Diagnoses:  Active Hospital Problems   Diagnosis Date Noted   Fall at home, initial encounter 12/30/2020   Malnutrition of moderate degree 01/02/2021   Gastro-esophageal reflux disease without esophagitis 12/30/2020   Tobacco abuse 12/30/2020   Femur fracture, left ([place]) 12/30/2020   Closed left hip fracture, initial encounter ([place]) 09/14/2019   Diabetes mellitus without complication (HCC)    Hypertension    Alcohol dependence ([place])    Chronic kidney disease, stage 3a [facility])     Resolved Hospital Problems  No resolved problems to display.    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  Also found to be incidentally COVID-positive.  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  Hospital Course:  Principal Problem:   Fall at home, initial encounter with closed left hip fracture, initial encounter: Patient seen by orthopedic surgery and underwent ORIF  on morning of 11/16.  Acute blood loss anemia: On postop day 1, hemoglobin dropped to 6.5 and transfuse 1 unit packed red blood cells.  On postop day 2, hemoglobin still 6.5 and patient transfused another unit packed red blood cells.  It was not felt that he had further bleeding but rather he was quite hemoconcentrated on postop day 1 and likely much lower than 6.5.  Following blood transfusions, hemoglobin at 8 and has remained there.  Active Problems:   Closed left hip fracture, initial encounter ([place])   Diabetes mellitus without complication ([name]): CBG stayed under 200.  Continued on insulin sliding scale.    Chronic kidney disease, stage 3a ([place]): Stable.    Hypertension: Stable, continue home medications   Alcohol dependence ([place])   Tobacco abuse: Nicotine patch.    Gastro-esophageal reflux disease without esophagitis: Continued on PPI.    Malnutrition of moderate degree: Nutrition Status: Nutrition Problem: Moderate Malnutrition Etiology: chronic illness (colon cancer) Signs/Symptoms: mild fat depletion, moderate fat depletion, mild muscle depletion, moderate muscle depletion Interventions: MVI, Glucerna shake, Premier Protein  COVID infection: Incidental finding.  Patient's breathing stable during entire hospitalization.  Not given remdesivir.  Procedures: Status post ORIF 11/16 Transfused 1 unit packed red blood cells on 11/17 and 11/18  Consultations: Orthopedic surgery  Discharge Exam: BP 124/65 (BP Location: Right Arm)   Pulse 90   Temp 98.5 F (36.9 C)   Resp 18   Ht 5\' 7"  (1.702 m)   Wt 58.5 kg   SpO2 100%   BMI 20.20 kg/m   General: Alert and  oriented x3, no acute distress Cardiovascular: Regular rate and rhythm, S1-S2 Respiratory: Clear to auscultation bilaterally  Discharge Instructions You were cared for by a hospitalist during your hospital stay. If you have any questions about your discharge medications or the care you received while you were in the  hospital after you are discharged, you can call the unit and asked to speak with the hospitalist on call if the hospitalist that took care of you is not available. Once you are discharged, your primary care physician will handle any further medical issues. Please note that NO REFILLS for any discharge medications will be authorized once you are discharged, as it is imperative that you return to your primary care physician (or establish a relationship with a primary care physician if you do not have one) for your aftercare needs so that they can reassess your need for medications and monitor your lab values.  Discharge Instructions     Diet - low sodium heart healthy   Complete by: As directed    Increase activity slowly   Complete by: As directed    Leave dressing on - Keep it clean, dry, and intact until clinic visit   Complete by: As directed       Allergies as of 01/05/2021   No Known Allergies      Medication List     STOP taking these medications    amLODipine 5 MG tablet Commonly known as: NORVASC   enoxaparin 40 MG/0.4ML injection Commonly known as: LOVENOX   traMADol 50 MG tablet Commonly known as: ULTRAM       TAKE these medications    acetaminophen 325 MG tablet Commonly known as: TYLENOL Take 650 mg by mouth every 6 (six) hours as needed.   apixaban 5 MG Tabs tablet Commonly known as: ELIQUIS Take 5 mg by mouth 2 (two) times daily.   atorvastatin 20 MG tablet Commonly known as: LIPITOR Take 20 mg by mouth at bedtime.   bisacodyl 10 MG suppository Commonly known as: DULCOLAX Place 1 suppository (10 mg total) rectally daily as needed for moderate constipation.   docusate sodium 100 MG capsule Commonly known as: COLACE Take 1 capsule (100 mg total) by mouth 2 (two) times daily.   DULoxetine 20 MG capsule Commonly known as: CYMBALTA Take 20 mg by mouth daily.   Ensure Max Protein Liqd Take 330 mLs (11 oz total) by mouth at bedtime.   feeding  supplement (GLUCERNA SHAKE) Liqd Take 237 mLs by mouth 2 (two) times daily between meals.   HYDROcodone-acetaminophen 5-325 MG tablet Commonly known as: NORCO/VICODIN Take 1-2 tablets by mouth every 6 (six) hours as needed for moderate pain.   insulin regular 100 units/mL injection Commonly known as: NOVOLIN R Inject 3 Units into the skin 3 (three) times daily before meals.   Melatonin 3 MG Caps Take 3 mg by mouth at bedtime as needed.   metFORMIN 500 MG tablet Commonly known as: Glucophage Take 1 tablet (500 mg total) by mouth daily with breakfast.   methocarbamol 500 MG tablet Commonly known as: ROBAXIN Take 1 tablet (500 mg total) by mouth every 6 (six) hours as needed for muscle spasms.   Multivitamin Adult (Minerals) Tabs Take 1 tablet by mouth daily.   omeprazole 20 MG capsule Commonly known as: PRILOSEC Take 20 mg by mouth daily.   ondansetron 4 MG tablet Commonly known as: ZOFRAN Take 1 tablet (4 mg total) by mouth every 6 (six) hours as needed for nausea.  oxyCODONE 5 MG immediate release tablet Commonly known as: Oxy IR/ROXICODONE Take 1 tablet (5 mg total) by mouth every 4 (four) hours as needed for severe pain (pain score 7-10).   polyethylene glycol powder 17 GM/SCOOP powder Commonly known as: GLYCOLAX/MIRALAX Take 17 g by mouth daily. MIX 17GM (1 CAPFUL) BY MOUTH ONCE EVERY DAY MIX 17 GRAMS (USE BOTTLE CAP) IN LIQUID OF CHOICE AS DIRECTED TO PREVENT CONSTIPATION   senna-docusate 8.6-50 MG tablet Commonly known as: Senokot-S Take 1 tablet by mouth at bedtime as needed for mild constipation.   tamsulosin 0.4 MG Caps capsule Commonly known as: FLOMAX Take 0.4 mg by mouth daily.               Durable Medical Equipment  (From admission, onward)           Start     Ordered   01/05/21 1157  For home use only DME lightweight manual wheelchair with seat cushion  Once       Comments: Patient suffers from impaired mobility which impairs their  ability to perform daily activities like ADLs in the home.  A walking aide will not resolve issue with performing activities of daily living. A wheelchair will allow patient to safely perform daily activities. Patient is not able to propel themselves in the home using a standard weight wheelchair due to weakness. Patient can self propel in the lightweight wheelchair. Length of need  6 months. Accessories: elevating leg rests (ELRs), wheel locks, extensions and anti-tippers. Back Cushion   01/05/21 1157              Discharge Care Instructions  (From admission, onward)           Start     Ordered   01/05/21 0000  Leave dressing on - Keep it clean, dry, and intact until clinic visit        01/05/21 1224           No Known Allergies  Follow-up Information     Duanne Guess, PA-C Follow up in 2 week(s).   Specialties: Orthopedic Surgery, Emergency Medicine Contact information: [place] [address] [phone]                  The results of significant diagnostics from this hospitalization (including imaging, microbiology, ancillary and laboratory) are listed below for reference.    Significant Diagnostic Studies: DG Pelvis 1-2 Views  Result Date: 12/30/2020 CLINICAL DATA:  Trauma, fall, pain EXAM: PELVIS - 1-2 VIEW COMPARISON:  09/15/2019 FINDINGS: No recent displaced fracture is seen. Osteopenia is seen in bony structures. There is previous right hip arthroplasty. There is previous internal fixation of intertrochanteric fracture of left femur with intramedullary rod. Extensive arterial calcifications are seen. Degenerative changes are noted in the visualized lower lumbar spine. IMPRESSION: No recent fracture is seen. Other findings as described in the body of the report. Electronically Signed   By: Elmer Picker M.D.   On: 12/30/2020 16:40   Chest Portable 1 View  Result Date: 12/30/2020 CLINICAL DATA:  Fall EXAM: PORTABLE CHEST 1  VIEW COMPARISON:  09/14/2019 FINDINGS: Heart and mediastinal contours are within normal limits. No focal opacities or effusions. No acute bony abnormality. Aortic atherosclerosis. IMPRESSION: No active disease. Electronically Signed   By: Rolm Baptise M.D.   On: 12/30/2020 22:47   DG C-Arm 1-60 Min  Result Date: 12/31/2020 CLINICAL DATA:  Known left femoral fracture EXAM: LEFT FEMUR 2 VIEWS; DG  C-ARM 1-60 MIN COMPARISON:  12/30/2020 FLUOROSCOPY TIME:  Radiation Exposure Index (as provided by the fluoroscopic device): Not available If the device does not provide the exposure index: Fluoroscopy Time:  2 minutes 5 seconds Number of Acquired Images:  6 FINDINGS: Multiple spot films were obtained which reveal a E lateral fixation sideplate along the left femur. Multiple proximal and distal fixation screws are noted with near anatomic alignment of the left femoral fracture. Prior left knee prosthesis and proximal fixation of the femur on the left are seen. IMPRESSION: ORIF of left femoral fracture. Electronically Signed   By: Inez Catalina M.D.   On: 12/31/2020 17:23   DG FEMUR MIN 2 VIEWS LEFT  Result Date: 12/31/2020 CLINICAL DATA:  Known left femoral fracture EXAM: LEFT FEMUR 2 VIEWS; DG C-ARM 1-60 MIN COMPARISON:  12/30/2020 FLUOROSCOPY TIME:  Radiation Exposure Index (as provided by the fluoroscopic device): Not available If the device does not provide the exposure index: Fluoroscopy Time:  2 minutes 5 seconds Number of Acquired Images:  6 FINDINGS: Multiple spot films were obtained which reveal a E lateral fixation sideplate along the left femur. Multiple proximal and distal fixation screws are noted with near anatomic alignment of the left femoral fracture. Prior left knee prosthesis and proximal fixation of the femur on the left are seen. IMPRESSION: ORIF of left femoral fracture. Electronically Signed   By: Inez Catalina M.D.   On: 12/31/2020 17:23   DG FEMUR MIN 2 VIEWS LEFT  Result Date:  12/30/2020 CLINICAL DATA:  Trauma, pain EXAM: LEFT FEMUR 2 VIEWS COMPARISON:  09/14/2019 FINDINGS: There is spiral fracture in the mid to distal shaft of left femur. There is distraction of the fracture fragments with posterior displacement of distal major fracture fragment. There is previous internal fixation of intertrochanteric fracture of left femur. There is previous left knee arthroplasty. Extensive arterial calcifications are seen in the soft tissues. IMPRESSION: There is displaced oblique/spiral fracture in the mid and distal shaft of left femur. Other findings as described in the body of the report. Electronically Signed   By: Elmer Picker M.D.   On: 12/30/2020 16:43    Microbiology: Recent Results (from the past 240 hour(s))  Resp Panel by RT-PCR (Flu A&B, Covid) Nasopharyngeal Swab     Status: Abnormal   Collection Time: 12/30/20  6:40 PM   Specimen: Nasopharyngeal Swab; Nasopharyngeal(NP) swabs in vial transport medium  Result Value Ref Range Status   SARS Coronavirus 2 by RT PCR POSITIVE (A) NEGATIVE Corrected    Comment: READ BACK AND VERIFIED BY Ladell Pier, RN AT 1940 12/30/20 BY [facility] RESULT CALLED TO, READ BACK BY AND VERIFIED WITH: (NOTE) SARS-CoV-2 target nucleic acids are DETECTED.  The SARS-CoV-2 RNA is generally detectable in upper respiratory specimens during the acute phase of infection. Positive results are indicative of the presence of the identified virus, but do not rule out bacterial infection or co-infection with other pathogens not detected by the test. Clinical correlation with patient history and other diagnostic information is necessary to determine patient infection status. The expected result is Negative.  Fact Sheet for Patients: EntrepreneurPulse.com.au  Fact Sheet for Healthcare Providers: IncredibleEmployment.be  This test is not yet approved or cleared by the [place] FDA and  has been authorized  for detection and/or diagnosis of SARS-CoV-2 by FDA under an Emergency Use Authorization (EUA).  This EUA will remain  in effect (meaning this test can be used) for the duration of  the COVID-19 declaration under  Section 564(b)(1) of the Act, 21 U.S.C. section 360bbb-3(b)(1), unless the authorization is terminated or revoked sooner.  CORRECTED ON 11/16 AT 0759: PREVIOUSLY REPORTED AS POSITIVE READ BACK AND VERIFIED BY Ladell Pier, RN AT 1940 12/30/20 BY [facility]    Influenza A by PCR NEGATIVE NEGATIVE Final   Influenza B by PCR NEGATIVE NEGATIVE Final    Comment: (NOTE) The Xpert Xpress SARS-CoV-2/FLU/RSV plus assay is intended as an aid in the diagnosis of influenza from Nasopharyngeal swab specimens and should not be used as a sole basis for treatment. Nasal washings and aspirates are unacceptable for Xpert Xpress SARS-CoV-2/FLU/RSV testing.  Fact Sheet for Patients: EntrepreneurPulse.com.au  Fact Sheet for Healthcare Providers: IncredibleEmployment.be  This test is not yet approved or cleared by the [place] FDA and has been authorized for detection and/or diagnosis of SARS-CoV-2 by FDA under an Emergency Use Authorization (EUA). This EUA will remain in effect (meaning this test can be used) for the duration of the COVID-19 declaration under Section 564(b)(1) of the Act, 21 U.S.C. section 360bbb-3(b)(1), unless the authorization is terminated or revoked.  Performed at [facility], [address] [place], [place] [address]   Surgical pcr screen     Status: Abnormal   Collection Time: 12/31/20 10:36 AM   Specimen: Nasal Mucosa; Nasal Swab  Result Value Ref Range Status   MRSA, PCR DETECTED (A) NEGATIVE Final    Comment: RESULT CALLED TO, READ BACK BY AND VERIFIED WITH: MATTHEW WRIGHT 12/31/20 1451 [facility]    Staphylococcus aureus DETECTED (A) NEGATIVE Final    Comment: Performed at [facility], [place], [place] [address]  SARS CORONAVIRUS 2 (TAT 6-24 HRS) Nasopharyngeal Nasopharyngeal Swab     Status: Abnormal   Collection Time: 01/01/21  2:34 PM   Specimen: Nasopharyngeal Swab  Result Value Ref Range Status   SARS Coronavirus 2 POSITIVE (A) NEGATIVE Final    Comment: (NOTE) SARS-CoV-2 target nucleic acids are DETECTED.  The SARS-CoV-2 RNA is generally detectable in upper and lower respiratory specimens during the acute phase of infection. Positive results are indicative of the presence of SARS-CoV-2 RNA. Clinical correlation with patient history and other diagnostic information is  necessary to determine patient infection status. Positive results do not rule out bacterial infection or co-infection with other viruses.  The expected result is Negative.  Fact Sheet for Patients: SugarRoll.be  Fact Sheet for Healthcare Providers: https://www.woods-mathews.com/  This test is not yet approved or cleared by the [place] FDA and  has been authorized for detection and/or diagnosis of SARS-CoV-2 by FDA under an Emergency Use Authorization (EUA). This EUA will remain  in effect (meaning this test can be used) for the duration of the COVID-19 declaration under Section 564(b)(1) of the Act, 21 U. S.C. section 360bbb-3(b)(1), unless the authorization is terminated or revoked sooner.   Performed at [facility], [place] [address] [place], [place] [address]      Labs: Basic Metabolic Panel: Recent Labs  Lab 12/30/20 1443 01/01/21 0459 01/02/21 0557 01/04/21 0530  NA 136 134* 134* 130*  K 4.6 5.3* 4.3 4.2  CL 103 100 102 99  CO2 26 26 26 26   GLUCOSE 211* 163* 180* 174*  BUN 25* 22 26* 20  CREATININE 1.15 1.40* 1.21 0.93  CALCIUM 8.9 8.1* 7.9* 8.3*   Liver Function Tests: No results for input(s): AST, ALT, ALKPHOS, BILITOT, PROT, ALBUMIN in the last 168 hours. No results for input(s): LIPASE, AMYLASE in the  last 168  hours. No results for input(s): AMMONIA in the last 168 hours. CBC: Recent Labs  Lab 12/30/20 1443 01/01/21 0459 01/02/21 0557 01/02/21 1653 01/04/21 0530  WBC 5.5 8.2 5.1  --  5.3  HGB 8.9* 6.5* 6.5* 8.0* 8.0*  HCT 27.0* 20.0* 19.9* 24.3* 23.8*  MCV 94.4 96.2 92.6  --  90.8  PLT 160 165 112*  --  150   Cardiac Enzymes: No results for input(s): CKTOTAL, CKMB, CKMBINDEX, TROPONINI in the last 168 hours. BNP: BNP (last 3 results) No results for input(s): BNP in the last 8760 hours.  ProBNP (last 3 results) No results for input(s): PROBNP in the last 8760 hours.  CBG: Recent Labs  Lab 01/04/21 0819 01/04/21 1221 01/04/21 1623 01/04/21 2102 01/05/21 0839  GLUCAP 176* 113* 168* 150* 109*       Signed:  Annita Brod, MD [facility] 01/05/2021, 12:26 PM

## 2021-01-05 NOTE — Progress Notes (Signed)
   Subjective: 5 Days Post-Op Procedure(s) (LRB): OPEN REDUCTION INTERNAL FIXATION (ORIF) DISTAL FEMUR FRACTURE - PERIPROSTHETIC FRACTURE (Left) Patient reports pain as mild.   Patient is well, and has had no acute complaints or problems Denies any CP, SOB, ABD pain. We will continue therapy today.  Plan is to go Home after hospital stay.  Objective: Vital signs in last 24 hours: Temp:  [97.9 F (36.6 C)-98.7 F (37.1 C)] 97.9 F (36.6 C) (11/21 0509) Pulse Rate:  [88-92] 88 (11/21 0509) Resp:  [18-20] 20 (11/21 0509) BP: (113-122)/(59-70) 113/65 (11/21 0509) SpO2:  [96 %-100 %] 100 % (11/21 0509)  Intake/Output from previous day: 11/20 0701 - 11/21 0700 In: -  Out: 900 [Urine:900] Intake/Output this shift: No intake/output data recorded.  Recent Labs    01/02/21 1653 01/04/21 0530  HGB 8.0* 8.0*   Recent Labs    01/02/21 1653 01/04/21 0530  WBC  --  5.3  RBC  --  2.62*  HCT 24.3* 23.8*  PLT  --  150   Recent Labs    01/04/21 0530  NA 130*  K 4.2  CL 99  CO2 26  BUN 20  CREATININE 0.93  GLUCOSE 174*  CALCIUM 8.3*   No results for input(s): LABPT, INR in the last 72 hours.  EXAM General - Patient is Alert, Appropriate, and Oriented Extremity - Neurovascular intact Sensation intact distally Intact pulses distally Dorsiflexion/Plantar flexion intact No cellulitis present Compartment soft Dressing - dressing C/D/I and no drainage Motor Function - intact, moving foot and toes well on exam.   Past Medical History:  Diagnosis Date   Diabetes mellitus without complication (HCC)    Hypertension     Assessment/Plan:   5 Days Post-Op Procedure(s) (LRB): OPEN REDUCTION INTERNAL FIXATION (ORIF) DISTAL FEMUR FRACTURE - PERIPROSTHETIC FRACTURE (Left) Principal Problem:   Fall at home, initial encounter Active Problems:   Closed left hip fracture, initial encounter ([place])   Diabetes mellitus without complication ([place])   Chronic kidney disease, stage 3a  ([place])   Hypertension   Alcohol dependence ([place])   Tobacco abuse   Gastro-esophageal reflux disease without esophagitis   Femur fracture, left (HCC)   Malnutrition of moderate degree  Estimated body mass index is 20.2 kg/m as calculated from the following:   Height as of this encounter: 5\' 7"  (1.702 m).   Weight as of this encounter: 58.5 kg. Advance diet Up with therapy Pain well controlled Vital signs are stable Acute postop blood loss anemia, hemoglobin 8.0, s/p 2 unit PRBC  Care management to assist with discharge to SNF  Follow up with [place] ortho in 2 weeks   DVT Prophylaxis - TED hose and SCDs, Eliquis Weight-Bearing as tolerated to left leg   T. Rachelle Hora, PA-C [place] 01/05/2021, 8:09 AM

## 2021-01-11 ENCOUNTER — Emergency Department
Admission: EM | Admit: 2021-01-11 | Discharge: 2021-01-28 | Disposition: A | Discharge status: Home / Self Care | Payer: No Typology Code available for payment source | Attending: Emergency Medicine | Admitting: Emergency Medicine

## 2021-01-11 ENCOUNTER — Emergency Department: Payer: No Typology Code available for payment source

## 2021-01-11 ENCOUNTER — Other Ambulatory Visit: Payer: Self-pay

## 2021-01-11 ENCOUNTER — Encounter: Payer: Self-pay | Admitting: Emergency Medicine

## 2021-01-11 DIAGNOSIS — Z7984 Long term (current) use of oral hypoglycemic drugs: Secondary | ICD-10-CM | POA: Insufficient documentation

## 2021-01-11 DIAGNOSIS — Z8781 Personal history of (healed) traumatic fracture: Secondary | ICD-10-CM

## 2021-01-11 DIAGNOSIS — F17200 Nicotine dependence, unspecified, uncomplicated: Secondary | ICD-10-CM | POA: Insufficient documentation

## 2021-01-11 DIAGNOSIS — Z96641 Presence of right artificial hip joint: Secondary | ICD-10-CM | POA: Insufficient documentation

## 2021-01-11 DIAGNOSIS — E1122 Type 2 diabetes mellitus with diabetic chronic kidney disease: Secondary | ICD-10-CM | POA: Diagnosis not present

## 2021-01-11 DIAGNOSIS — W19XXXA Unspecified fall, initial encounter: Secondary | ICD-10-CM

## 2021-01-11 DIAGNOSIS — Z85038 Personal history of other malignant neoplasm of large intestine: Secondary | ICD-10-CM | POA: Insufficient documentation

## 2021-01-11 DIAGNOSIS — Z9889 Other specified postprocedural states: Secondary | ICD-10-CM

## 2021-01-11 DIAGNOSIS — S0990XA Unspecified injury of head, initial encounter: Secondary | ICD-10-CM | POA: Insufficient documentation

## 2021-01-11 DIAGNOSIS — M79605 Pain in left leg: Secondary | ICD-10-CM | POA: Diagnosis not present

## 2021-01-11 DIAGNOSIS — Z96653 Presence of artificial knee joint, bilateral: Secondary | ICD-10-CM | POA: Insufficient documentation

## 2021-01-11 DIAGNOSIS — Z7901 Long term (current) use of anticoagulants: Secondary | ICD-10-CM | POA: Diagnosis not present

## 2021-01-11 DIAGNOSIS — Z79899 Other long term (current) drug therapy: Secondary | ICD-10-CM | POA: Diagnosis not present

## 2021-01-11 DIAGNOSIS — R52 Pain, unspecified: Secondary | ICD-10-CM

## 2021-01-11 DIAGNOSIS — I129 Hypertensive chronic kidney disease with stage 1 through stage 4 chronic kidney disease, or unspecified chronic kidney disease: Secondary | ICD-10-CM | POA: Insufficient documentation

## 2021-01-11 DIAGNOSIS — Z8616 Personal history of COVID-19: Secondary | ICD-10-CM | POA: Insufficient documentation

## 2021-01-11 DIAGNOSIS — S728X2S Other fracture of left femur, sequela: Secondary | ICD-10-CM | POA: Diagnosis not present

## 2021-01-11 DIAGNOSIS — Z794 Long term (current) use of insulin: Secondary | ICD-10-CM | POA: Diagnosis not present

## 2021-01-11 DIAGNOSIS — N1831 Chronic kidney disease, stage 3a: Secondary | ICD-10-CM | POA: Diagnosis not present

## 2021-01-11 DIAGNOSIS — S7292XS Unspecified fracture of left femur, sequela: Secondary | ICD-10-CM

## 2021-01-11 LAB — URINALYSIS, COMPLETE (UACMP) WITH MICROSCOPIC
Bacteria, UA: NONE SEEN
Bilirubin Urine: NEGATIVE
Glucose, UA: NEGATIVE mg/dL
Hgb urine dipstick: NEGATIVE
Ketones, ur: NEGATIVE mg/dL
Nitrite: NEGATIVE
Protein, ur: NEGATIVE mg/dL
Specific Gravity, Urine: 1.015 (ref 1.005–1.030)
pH: 6 (ref 5.0–8.0)

## 2021-01-11 LAB — CBC
HCT: 32.6 % — ABNORMAL LOW (ref 39.0–52.0)
Hemoglobin: 10 g/dL — ABNORMAL LOW (ref 13.0–17.0)
MCH: 30.3 pg (ref 26.0–34.0)
MCHC: 30.7 g/dL (ref 30.0–36.0)
MCV: 98.8 fL (ref 80.0–100.0)
Platelets: 271 10*3/uL (ref 150–400)
RBC: 3.3 MIL/uL — ABNORMAL LOW (ref 4.22–5.81)
RDW: 15.3 % (ref 11.5–15.5)
WBC: 6.7 10*3/uL (ref 4.0–10.5)
nRBC: 0 % (ref 0.0–0.2)

## 2021-01-11 IMAGING — US US EXTREM LOW VENOUS*L*
1 series · 13 of 24 positions shown · non-contrast
Comparison: None.

CLINICAL DATA: Left lower extremity pain. History of colon cancer.
Evaluate for DVT.



[Series 1: us venous img lower uni left (dvt) · portal-venous · 13 of 35 slices shown]
[im 1/35]
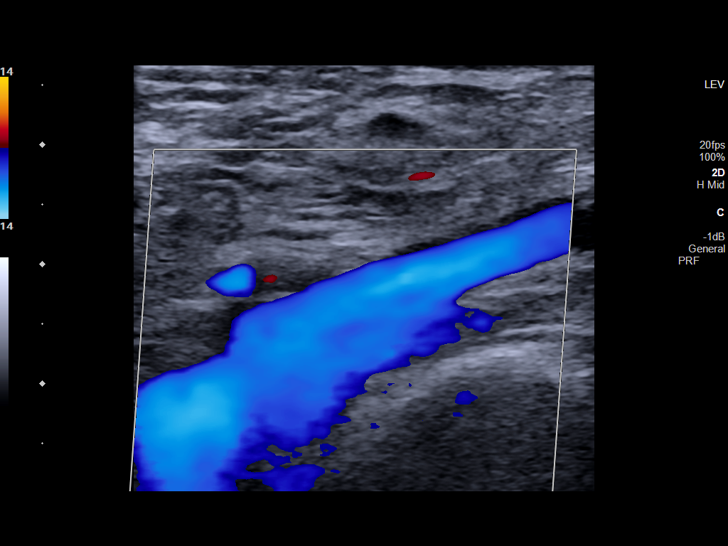
[im 3/35]
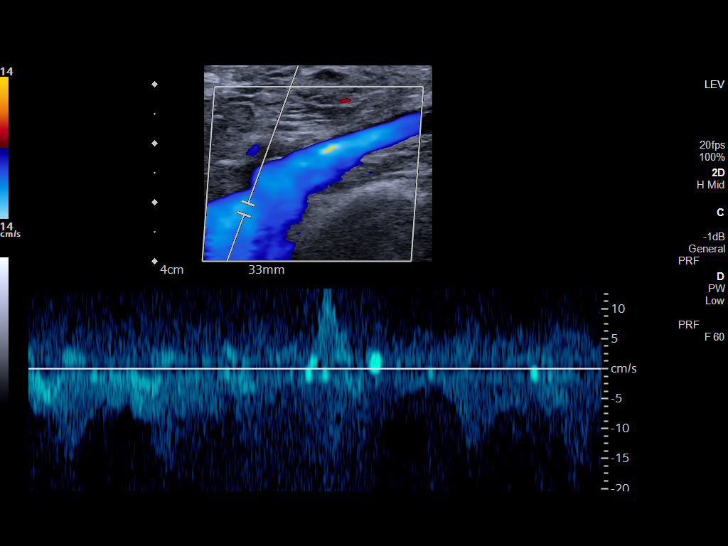
[im 6/35]
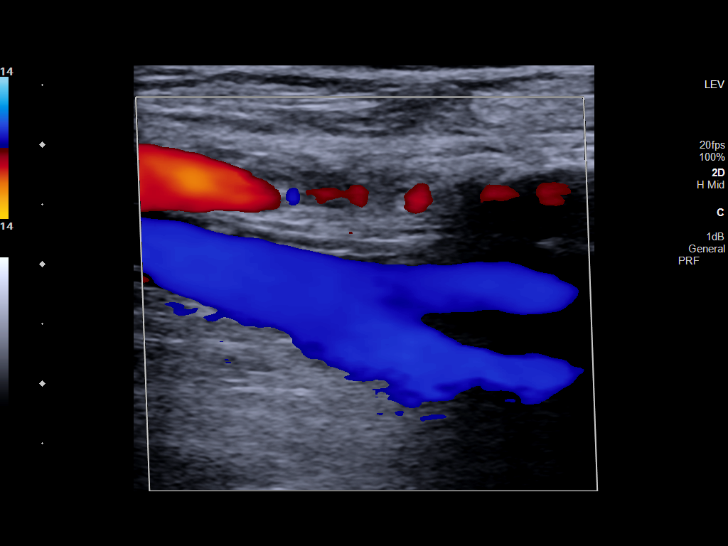
[im 9/35]
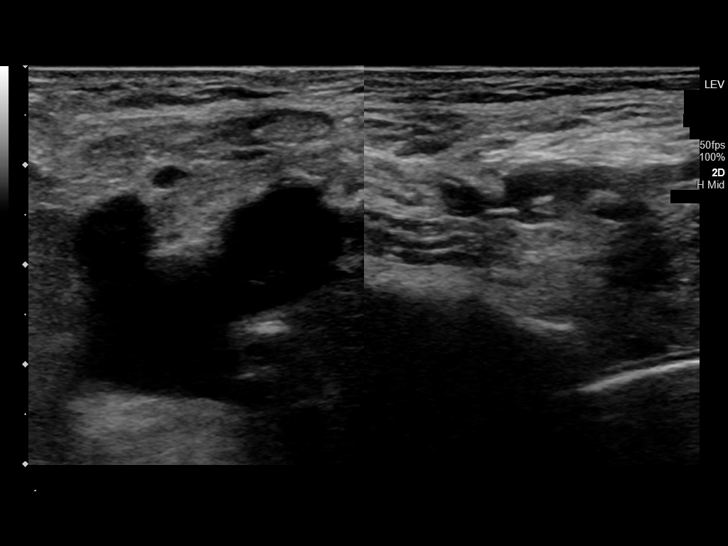
[im 12/35]
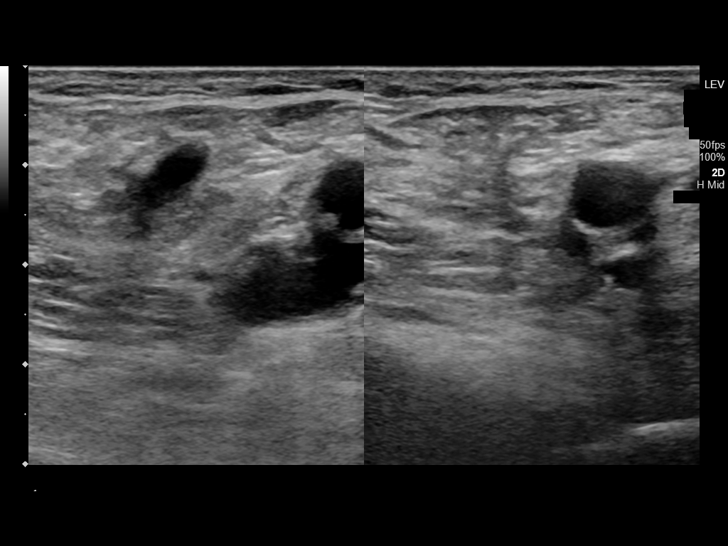
[im 15/35]
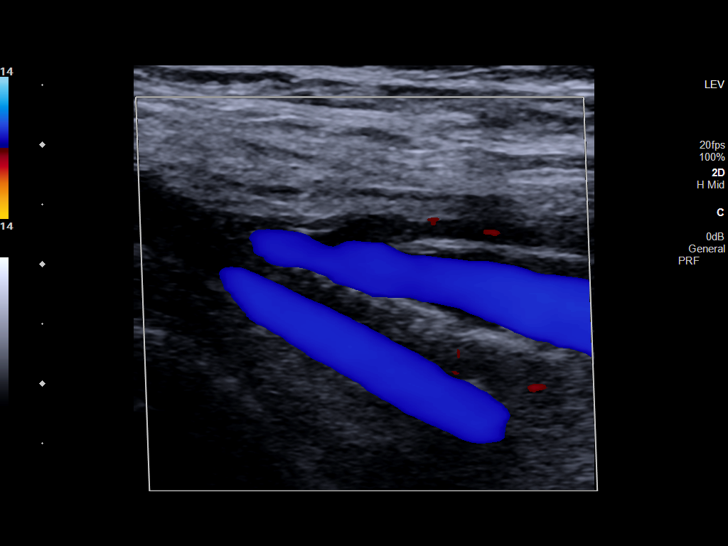
[im 18/35]
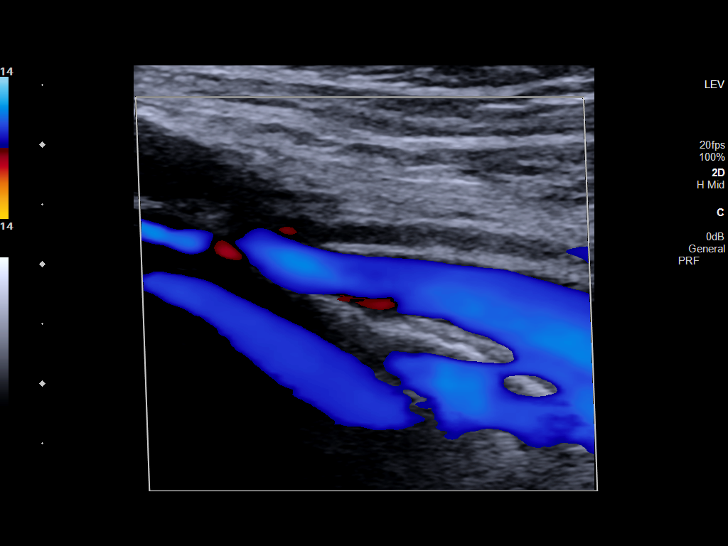
[im 20/35]
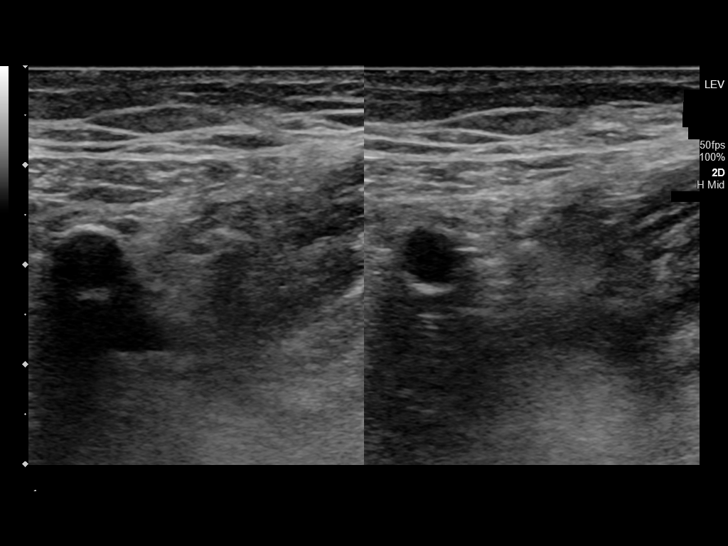
[im 23/35]
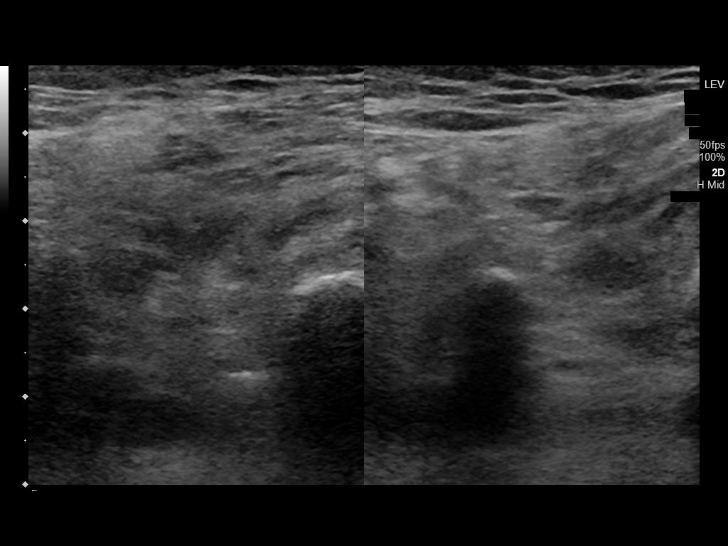
[im 26/35]
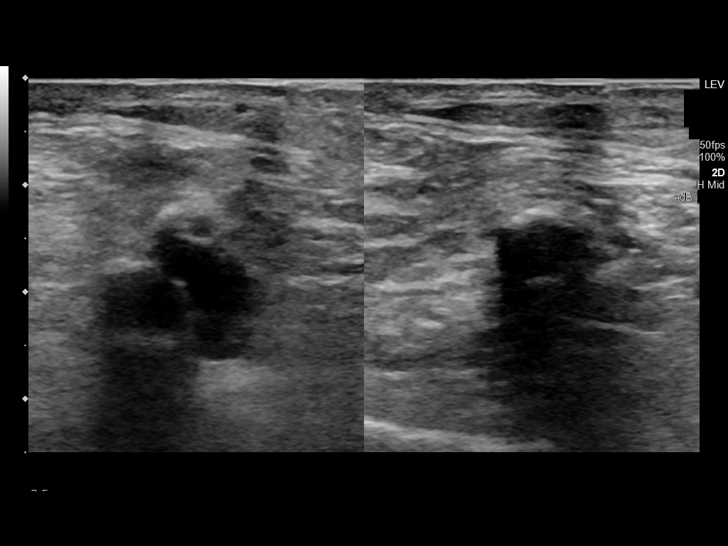
[im 29/35]
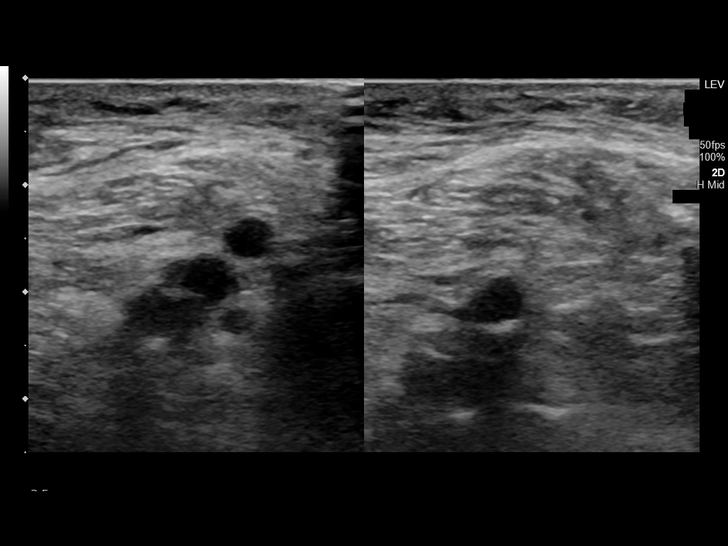
[im 32/35]
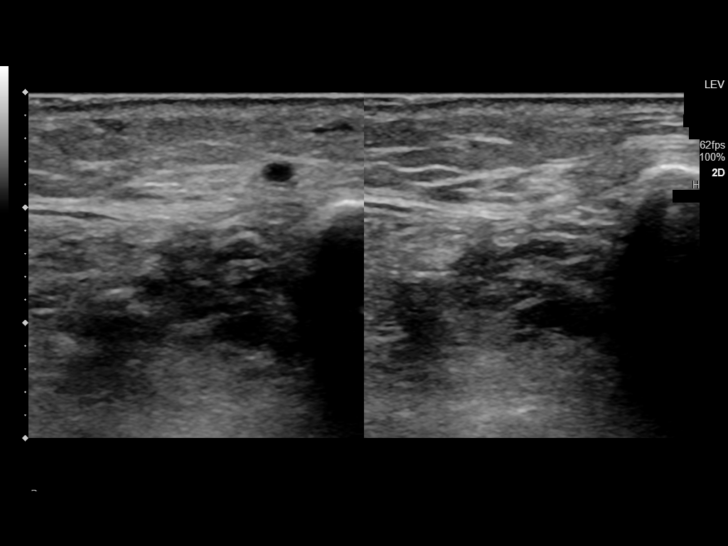
[im 35/35]
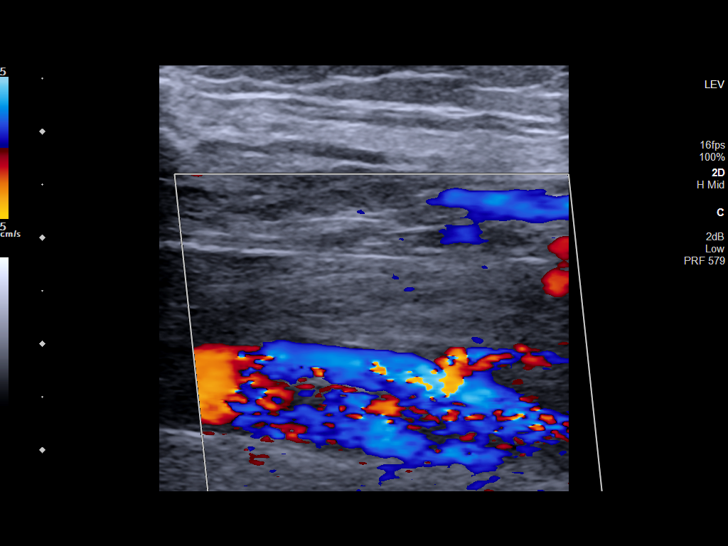

[13 of 24 positions shown; findings below may reference images not displayed]

FINDINGS: Contralateral Common Femoral Vein: Respiratory phasicity is normal
and symmetric with the symptomatic side. No evidence of thrombus.
Normal compressibility.

Common Femoral Vein: No evidence of thrombus. Normal
compressibility, respiratory phasicity and response to augmentation.

Saphenofemoral Junction: No evidence of thrombus. Normal
compressibility and flow on color Doppler imaging.

Profunda Femoral Vein: No evidence of thrombus. Normal
compressibility and flow on color Doppler imaging.

Femoral Vein: No evidence of thrombus. Normal compressibility,
respiratory phasicity and response to augmentation.

Popliteal Vein: No evidence of thrombus. Normal compressibility,
respiratory phasicity and response to augmentation.

Calf Veins: No evidence of thrombus. Normal compressibility and flow
on color Doppler imaging.

Superficial Great Saphenous Vein: No evidence of thrombus. Normal
compressibility.

Venous Reflux:  None.

Other Findings: Extensive echogenic atherosclerotic plaque noted
throughout the incidentally imaged arterial vasculature of the left
lower extremity.
IMPRESSION: No evidence of DVT within the left lower extremity.

## 2021-01-11 IMAGING — CR DG KNEE COMPLETE 4+V*L*
4 series · 4 of 4 positions shown · non-contrast
Comparison: [DATE]

CLINICAL DATA: Fell yesterday day, continued knee pain

EXAM:
LEFT KNEE - COMPLETE 4+ VIEW

[knee ap]
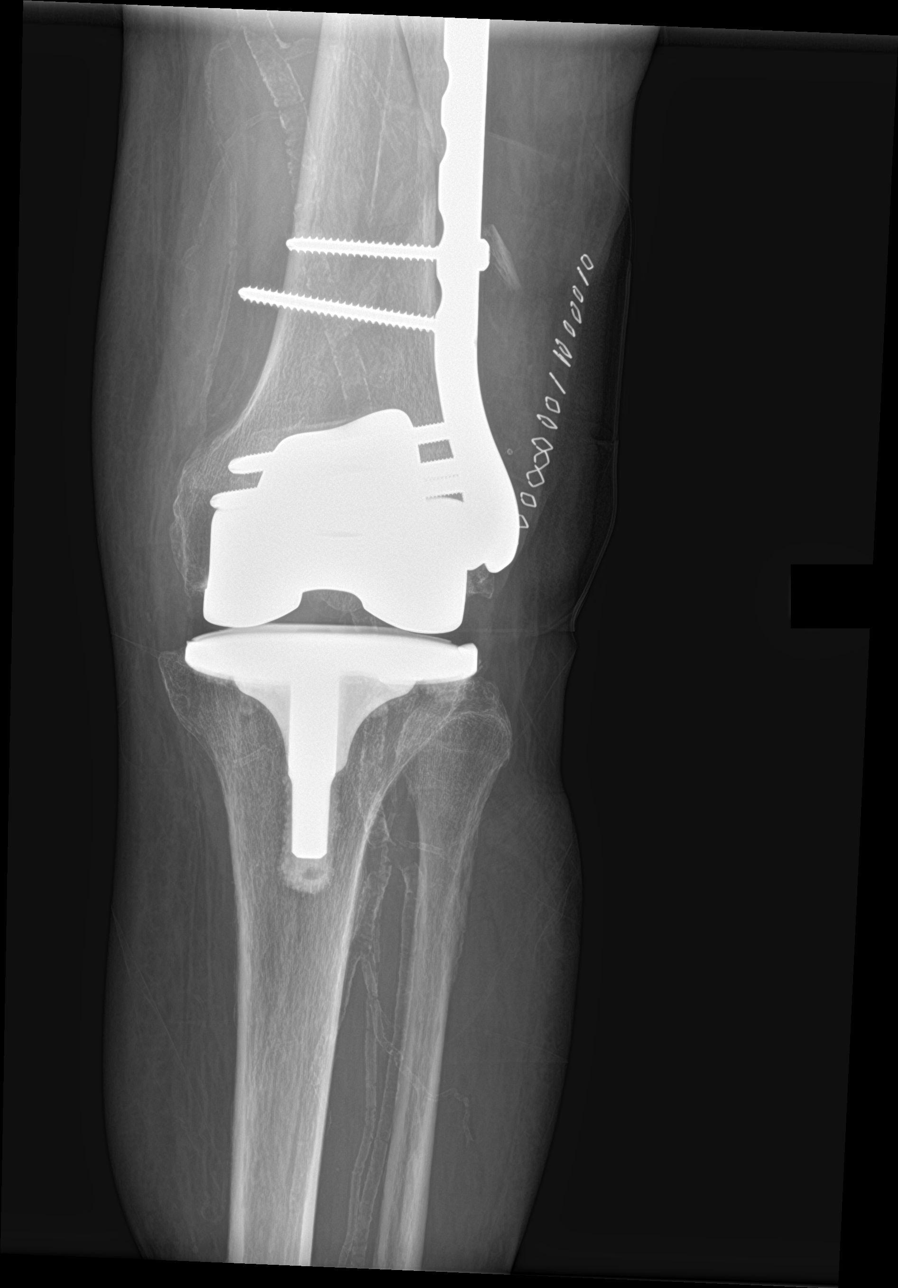

[knee obl (1 of 2)]
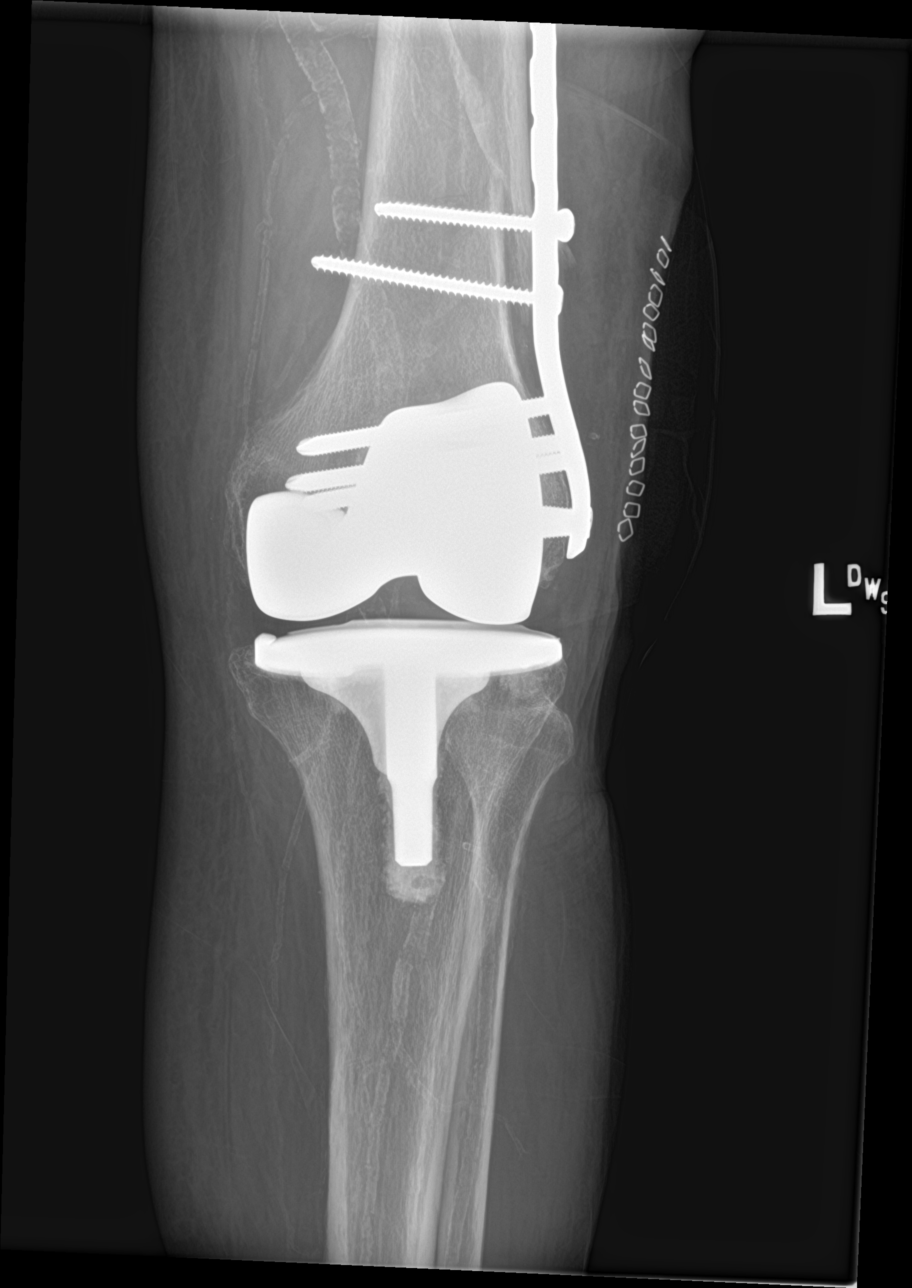

[knee obl (2 of 2)]
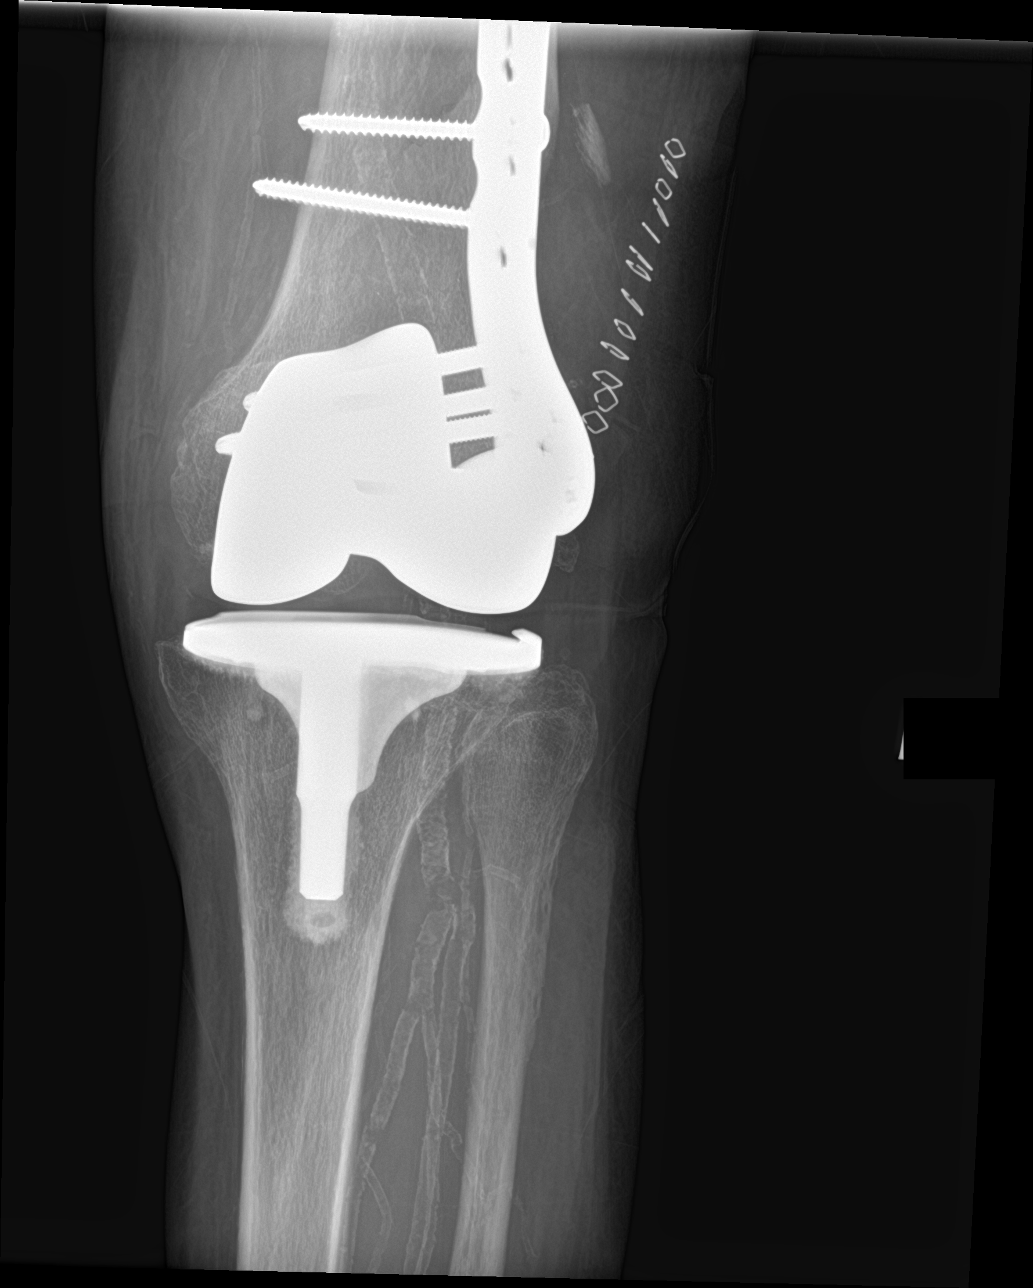

[knee lat]
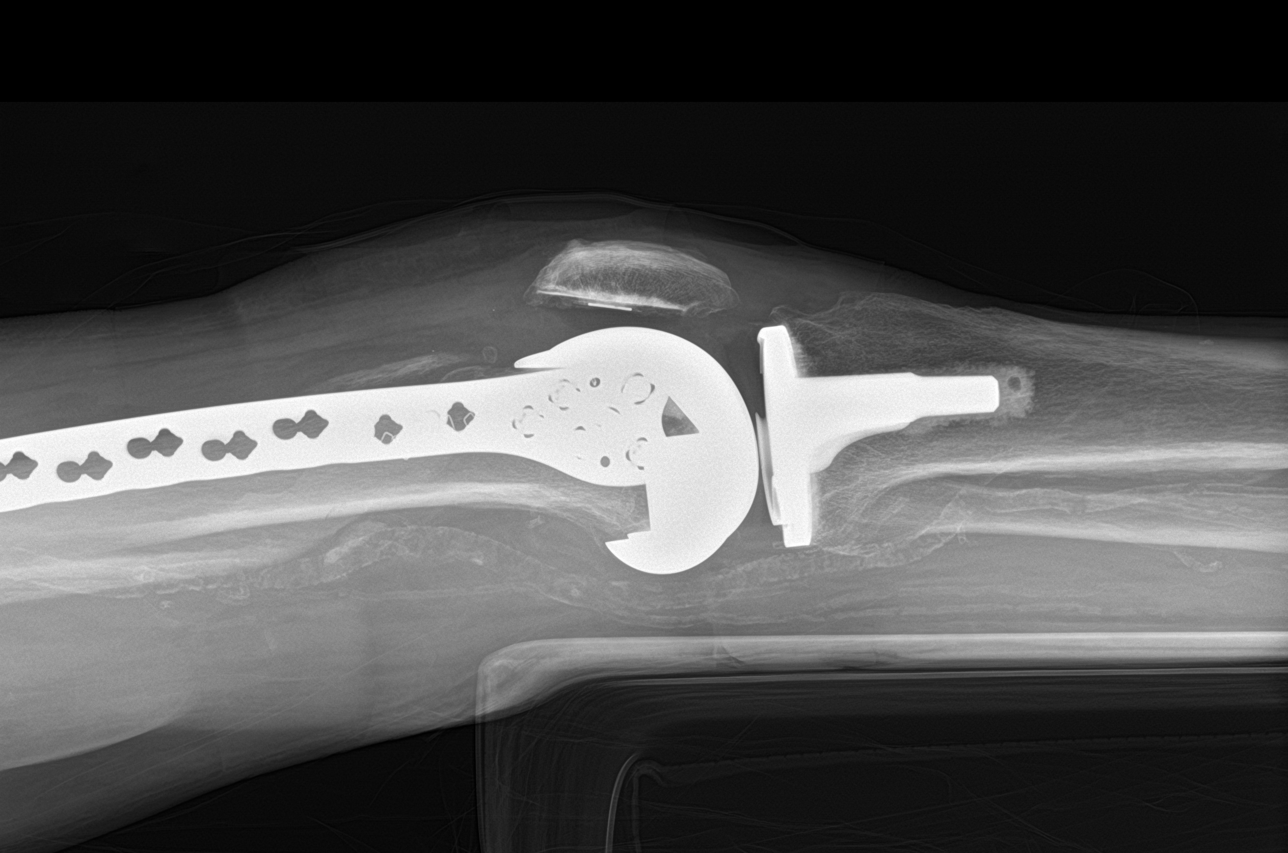

[4 of 4 positions shown; findings below may reference images not displayed]

FINDINGS: Osseous demineralization.

LEFT knee prosthesis and lateral femoral plate/screws identified.

Distal femoral diaphyseal fracture again seen.

No additional fracture, dislocation, or bone destruction.

Extensive atherosclerotic calcifications.
IMPRESSION: Osseous demineralization with old LEFT knee arthroplasty.

Recent ORIF of oblique distal femoral diaphyseal fracture.

No new abnormalities.

## 2021-01-11 IMAGING — CT CT HEAD W/O CM
4 series · 17 of 47 positions shown, 19 images · non-contrast
Comparison: None.

CLINICAL DATA: Head trauma

EXAM:
CT HEAD WITHOUT CONTRAST
TECHNIQUE: Contiguous axial images were obtained from the base of the skull
through the vertex without intravenous contrast.

[Series 2: head wo · axial · 0.42mm/px · z∈[+451,+571]mm · 7 of 32 slices shown, 9 images]
[im 4/32  brain]
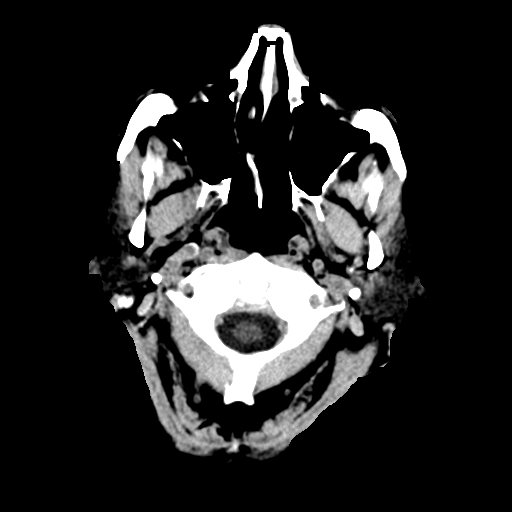
[im 4/32  bone]
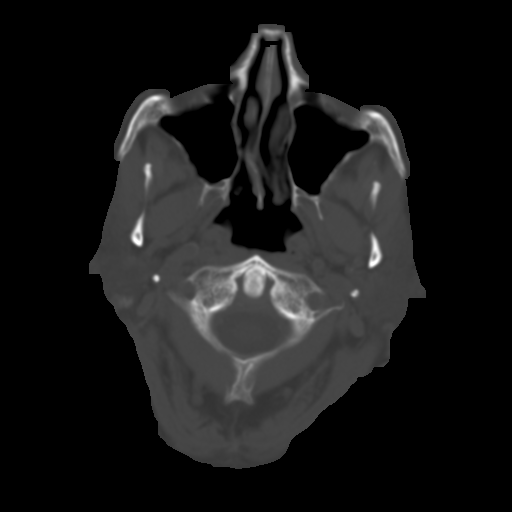
[im 8/32  brain]
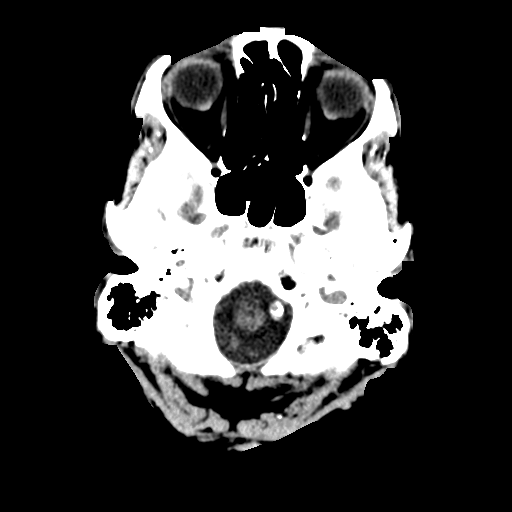
[im 12/32  brain]
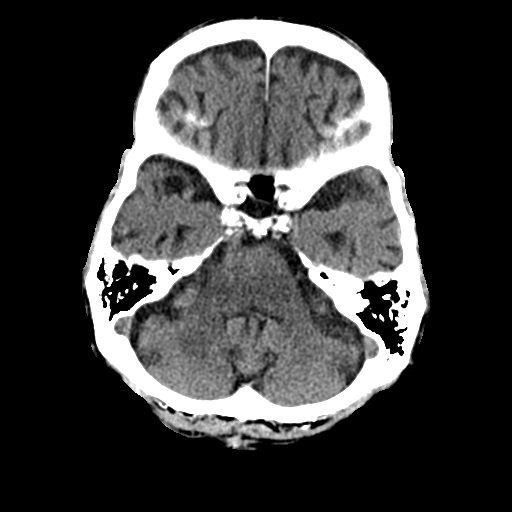
[im 16/32  brain]
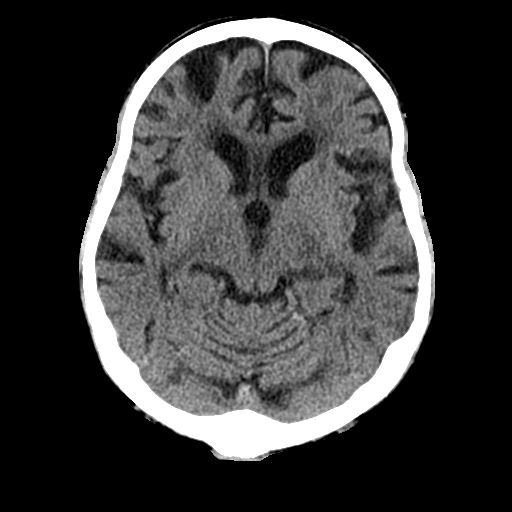
[im 20/32  brain]
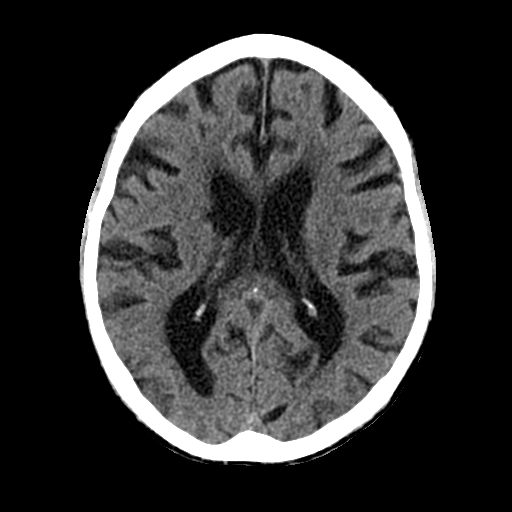
[im 20/32  bone]
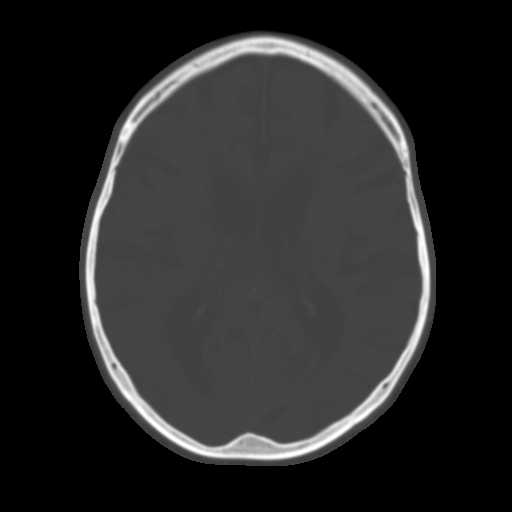
[im 24/32  brain]
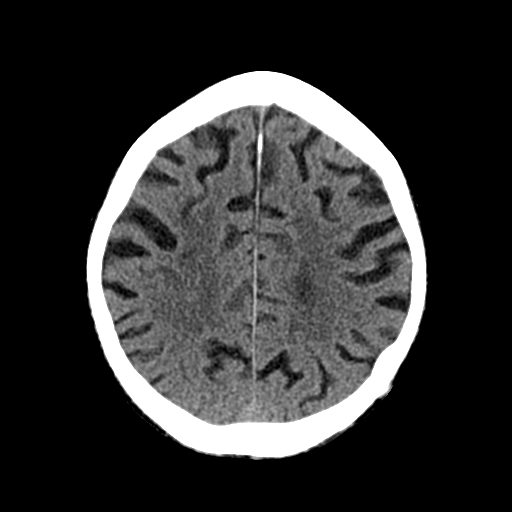
[im 28/32  brain]
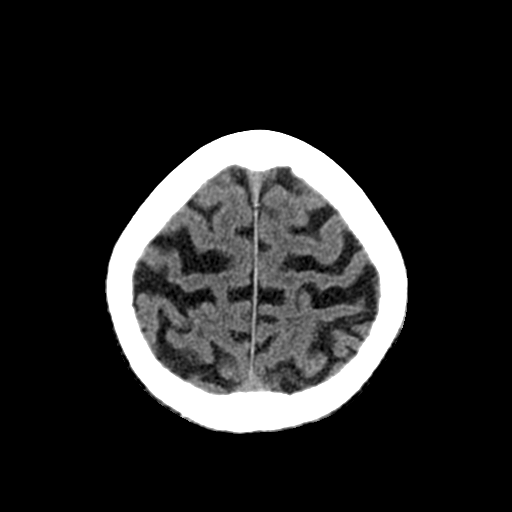

[Series 3: head bone · axial · 0.42mm/px · z∈[+450,+506]mm · 4 of 79 slices shown]
[im 8/79  bone]
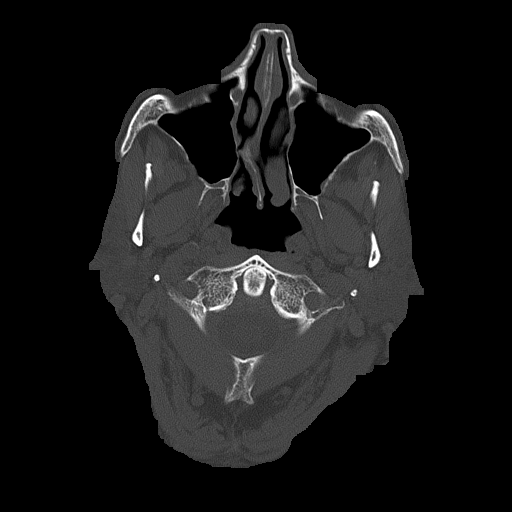
[im 16/79  bone]
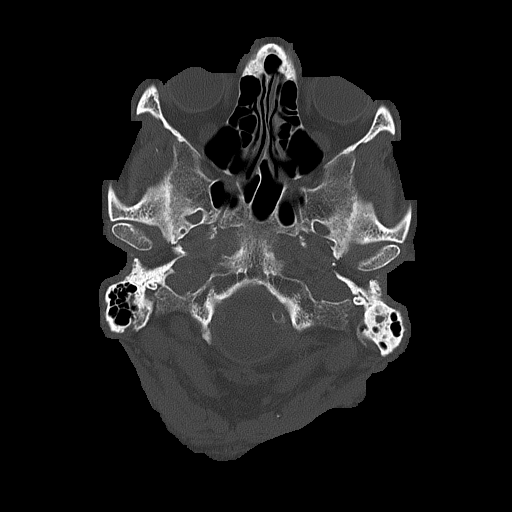
[im 24/79  bone]
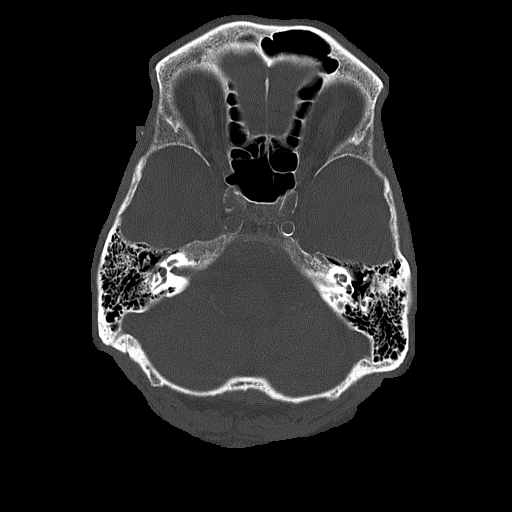
[im 36/79  bone]
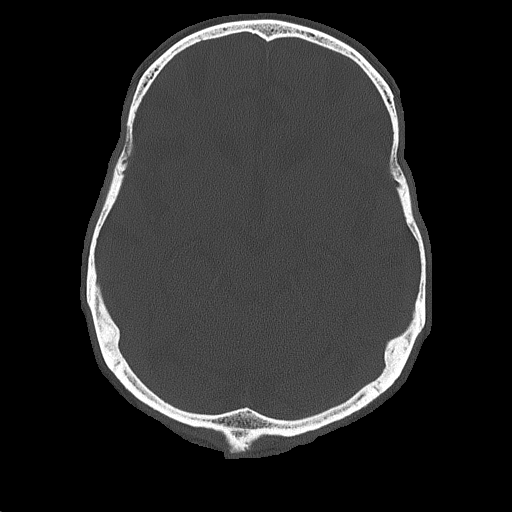

[Series 4: coronal soft tissue · coronal · 0.34mm/px · 3 of 66 slices shown]
[im 22/66  brain]
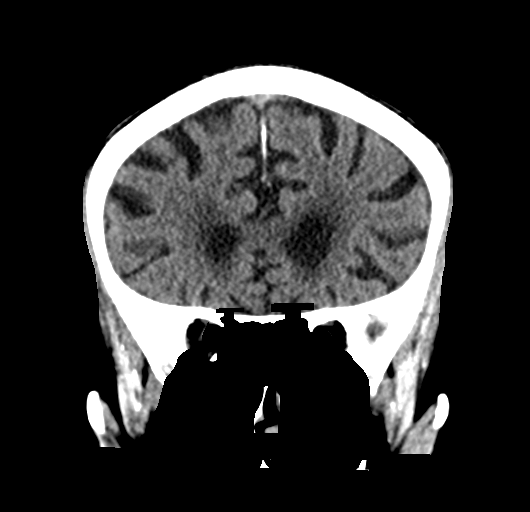
[im 29/66  brain]
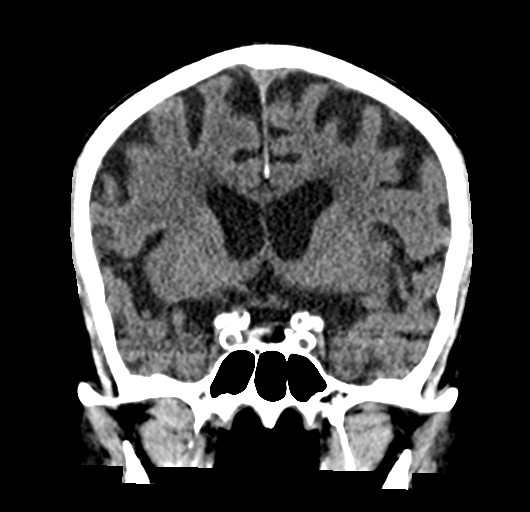
[im 37/66  brain]
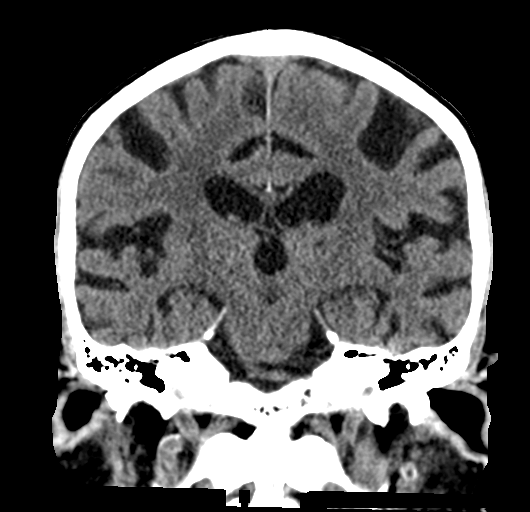

[Series 5: sagittal soft tissue · sagittal · 0.34mm/px · 3 of 59 slices shown]
[im 20/59  brain]
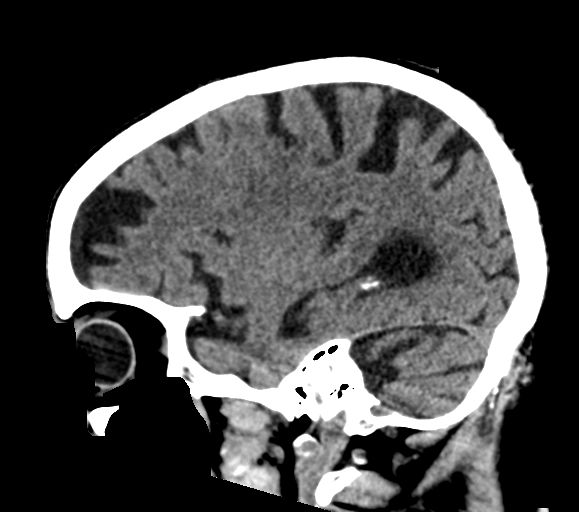
[im 30/59  brain]
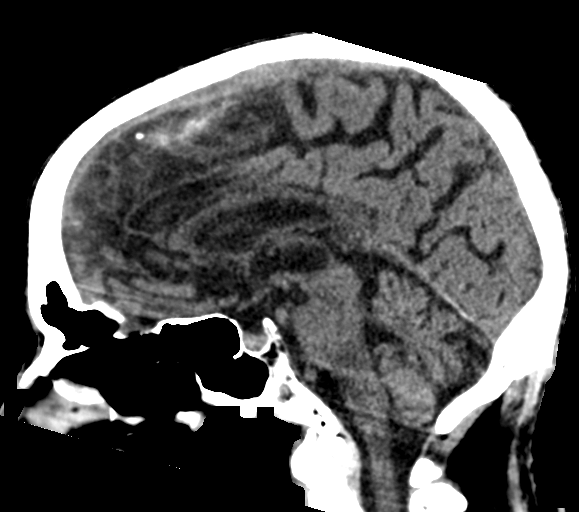
[im 39/59  brain]
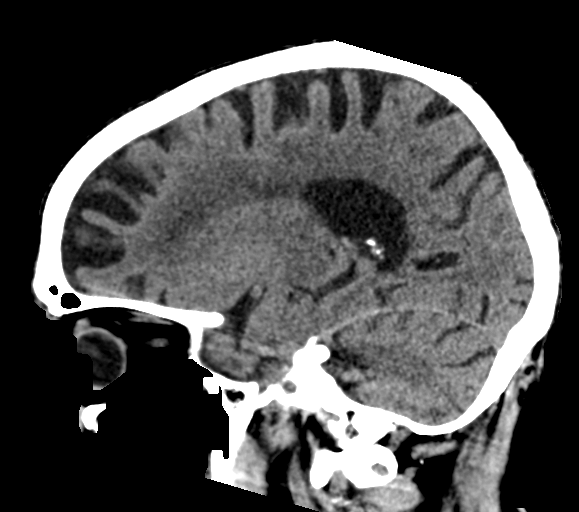

[17 of 47 positions shown; findings below may reference images not displayed]

FINDINGS: Brain: No acute intracranial hemorrhage, mass effect, or herniation.
No extra-axial fluid collections. No evidence of acute territorial
infarct. No hydrocephalus. Moderate cortical volume loss. Patchy
hypodensities in the periventricular and subcortical white matter,
likely secondary to chronic microvascular ischemic changes.

Vascular: Calcified plaques in the carotid siphons and distal
vertebral arteries.

Skull: Normal. Negative for fracture or focal lesion.

Sinuses/Orbits: No acute finding.

Other: None.
IMPRESSION: Chronic changes with no acute intracranial process identified.

## 2021-01-11 IMAGING — CR DG FEMUR 2+V*L*
4 series · 4 of 4 positions shown · non-contrast
Comparison: [DATE]

CLINICAL DATA: Fell 4 days ago, worsened LEFT leg pain

EXAM:
LEFT FEMUR 2 VIEWS

[femur ap (1 of 2)]
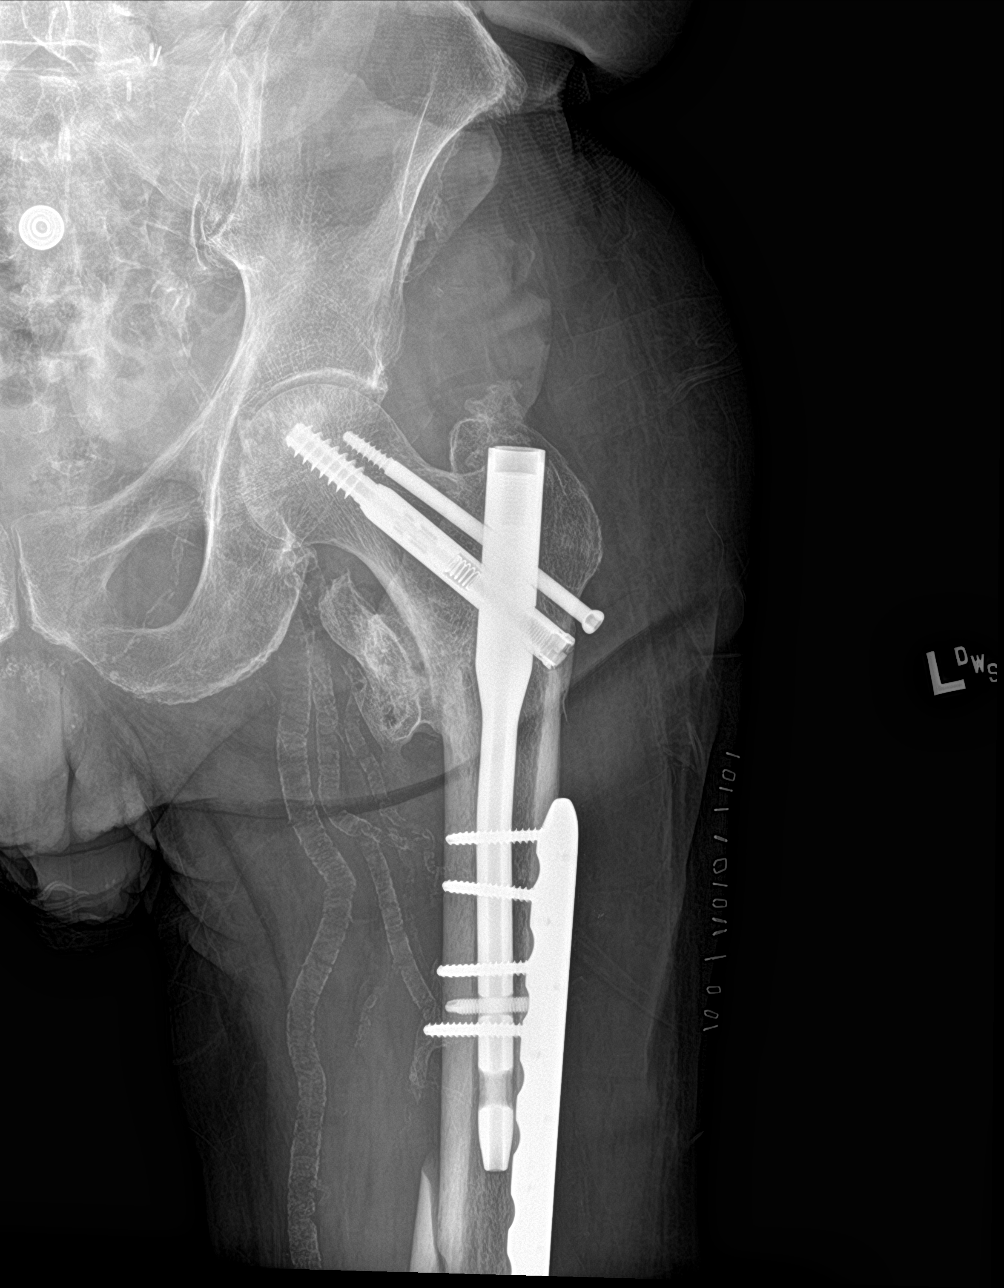

[femur ap (2 of 2)]
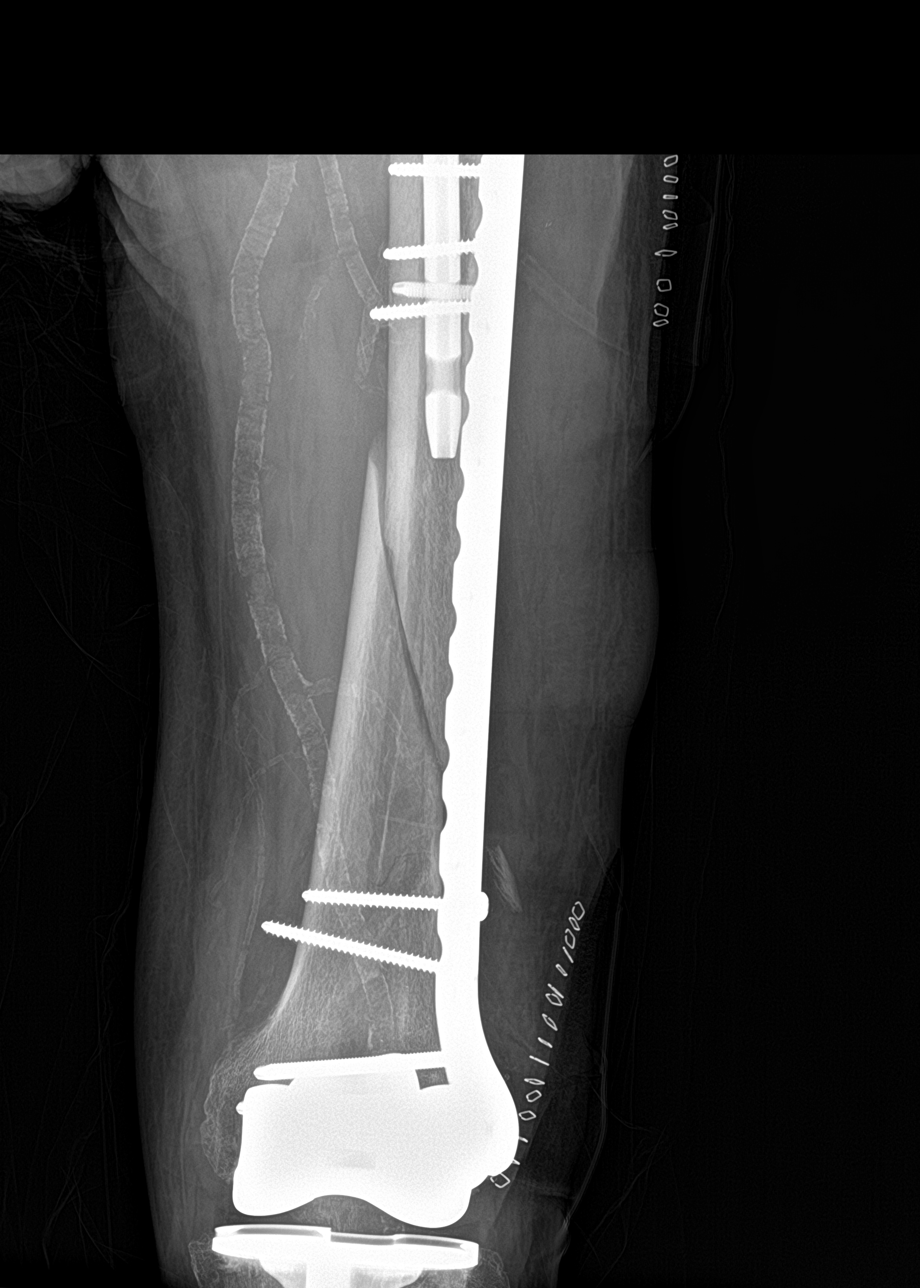

[femur lat (1 of 2)]
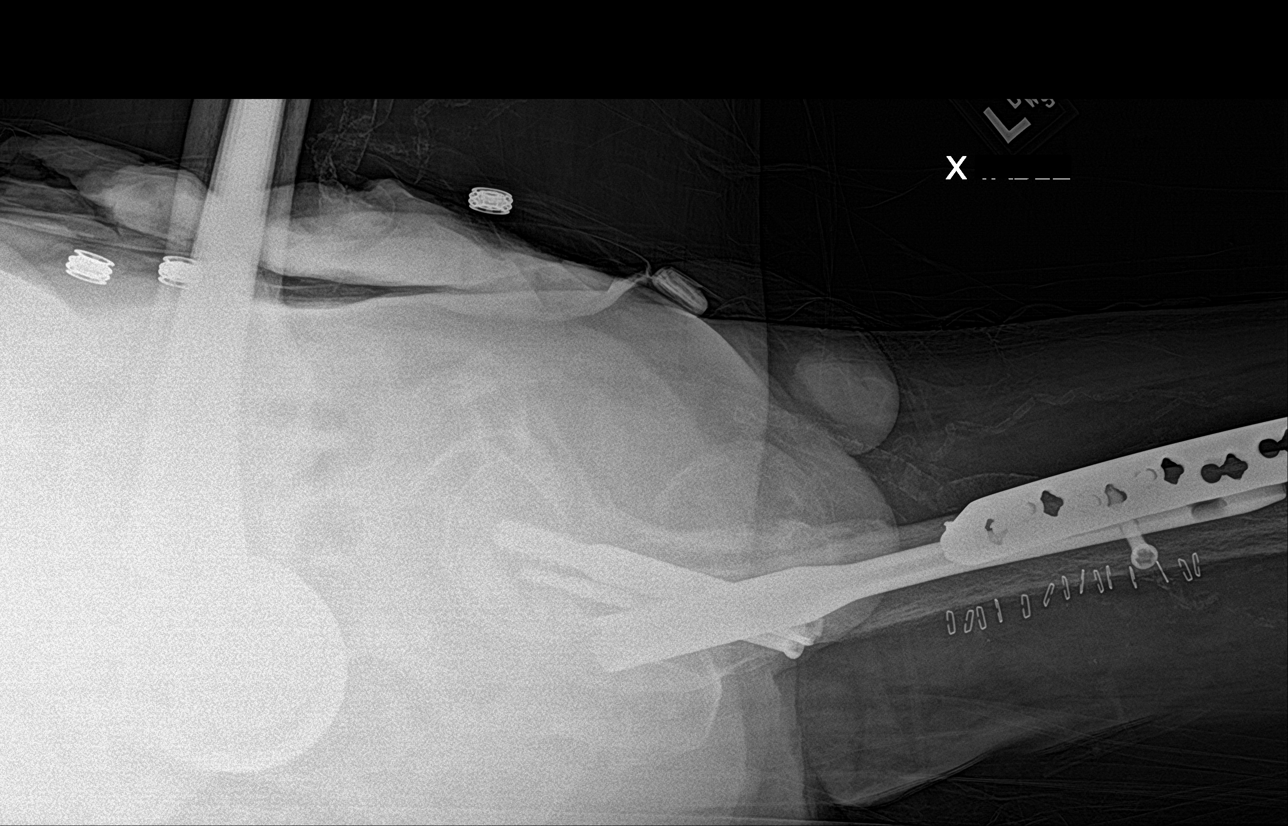

[femur lat (2 of 2)]
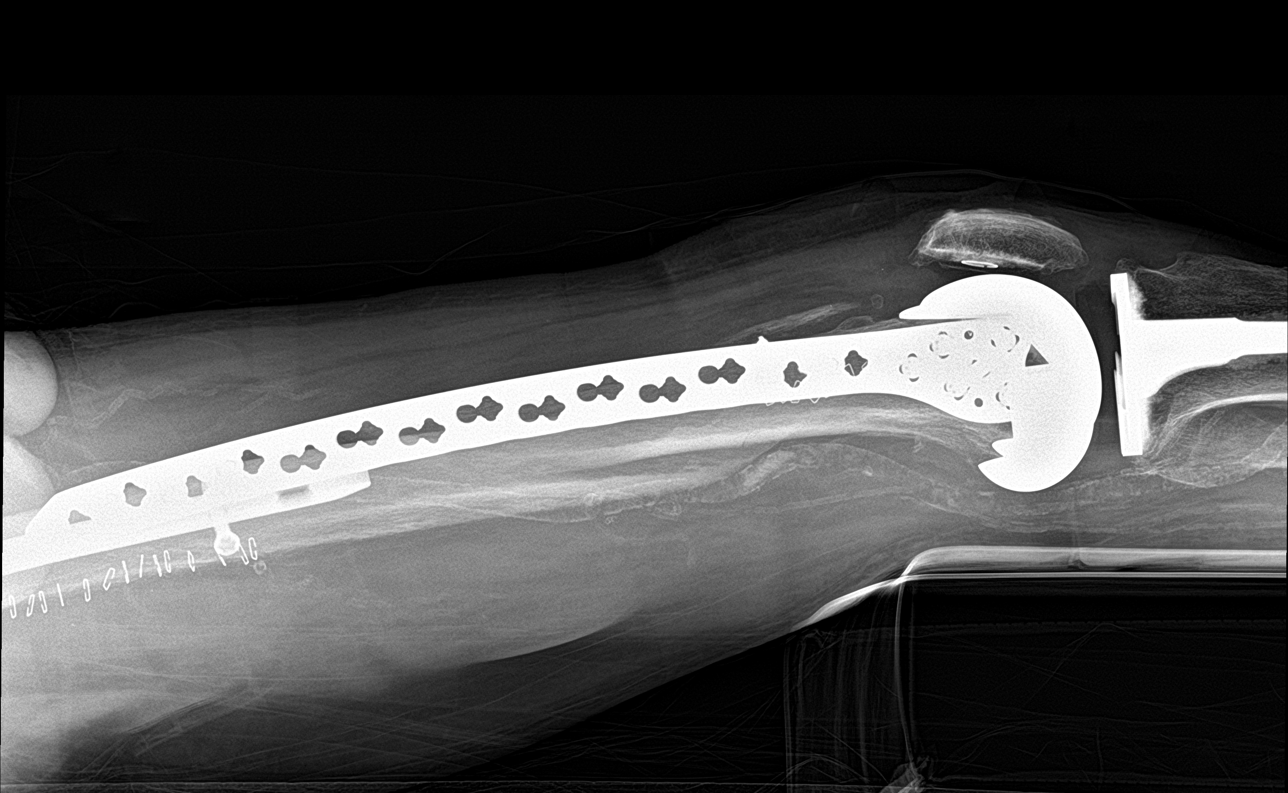

[4 of 4 positions shown; findings below may reference images not displayed]

FINDINGS: LEFT knee prosthesis in expected position.

IM nail LEFT femur with proximal compression screws.

Lateral plate and multiple screws of mid to distal LEFT femur across
a recent oblique distal femoral diaphyseal fracture.

Marked osseous demineralization.

LEFT hip joint space narrowing.

No new fracture, dislocation, or bone destruction.

Extensive atherosclerotic calcifications.

Hardware appears intact.
IMPRESSION: Old LEFT knee arthroplasty and proximal RIGHT femoral nailing.

Recent ORIF of distal LEFT femoral oblique diaphyseal fracture.

No new osseous abnormalities.

## 2021-01-11 IMAGING — CT CT CERVICAL SPINE W/O CM
3 of 4 series · 13 of 35 positions shown, 16 images · non-contrast
Comparison: None.

CLINICAL DATA: Neck trauma

EXAM:
CT CERVICAL SPINE WITHOUT CONTRAST
TECHNIQUE: Multidetector CT imaging of the cervical spine was performed without
intravenous contrast. Multiplanar CT image reconstructions were also
generated.

[Series 4: sagittal bone · sagittal · 0.41mm/px · 5 of 113 slices shown, 6 images]
[im 38/113  bone]
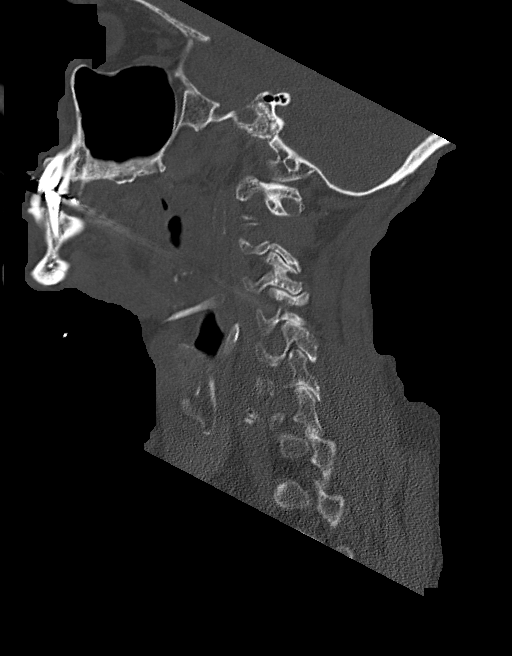
[im 47/113  bone]
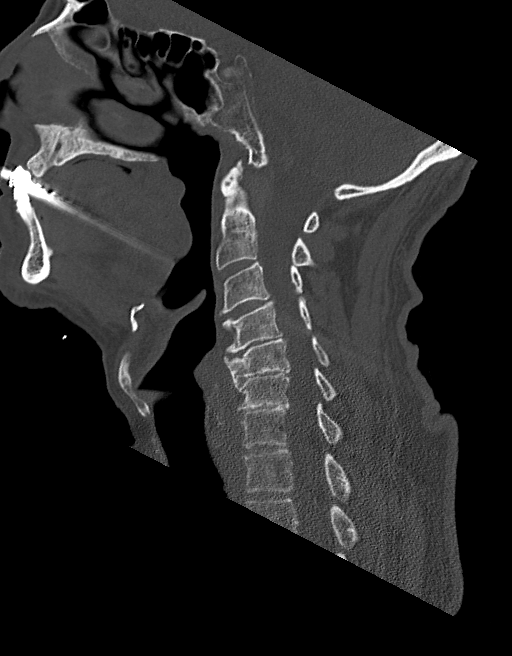
[im 57/113  soft-tissue]
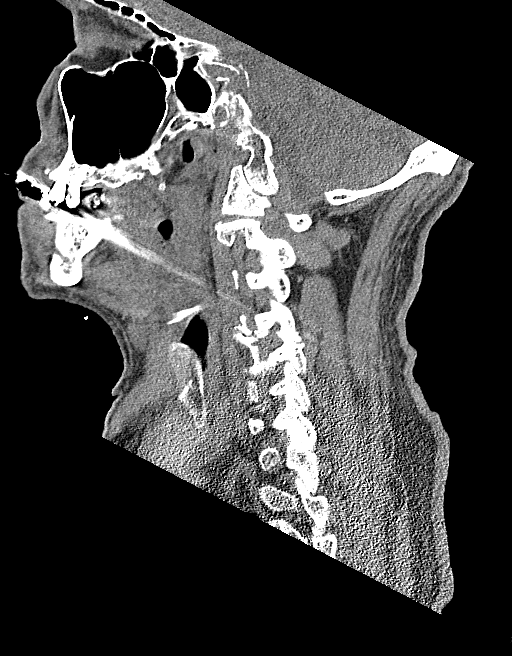
[im 57/113  bone]
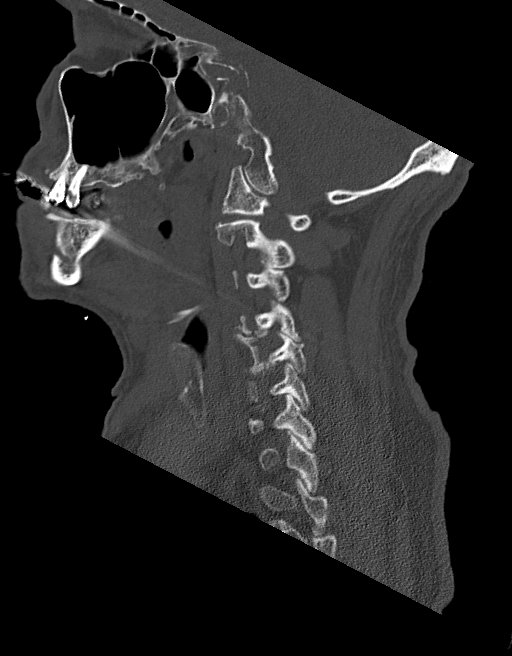
[im 66/113  bone]
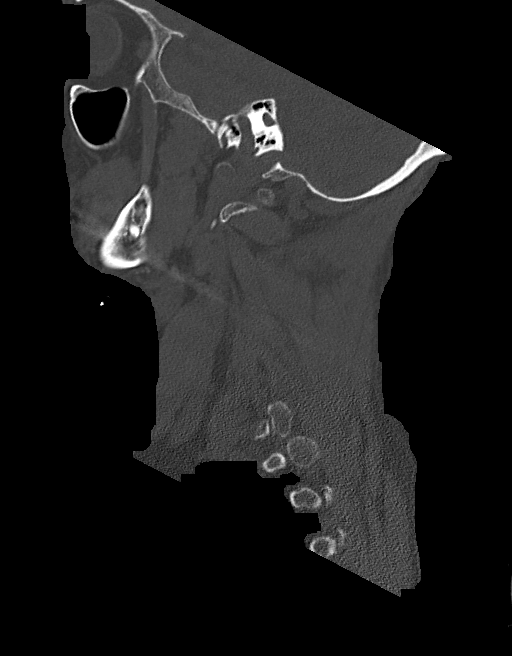
[im 75/113  bone]
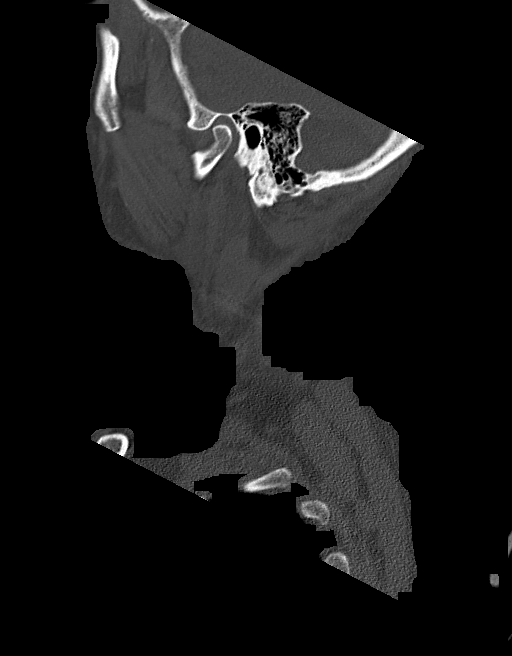

[Series 5: coronal bone · coronal · 0.40mm/px · 3 of 119 slices shown]
[im 24/119  bone]
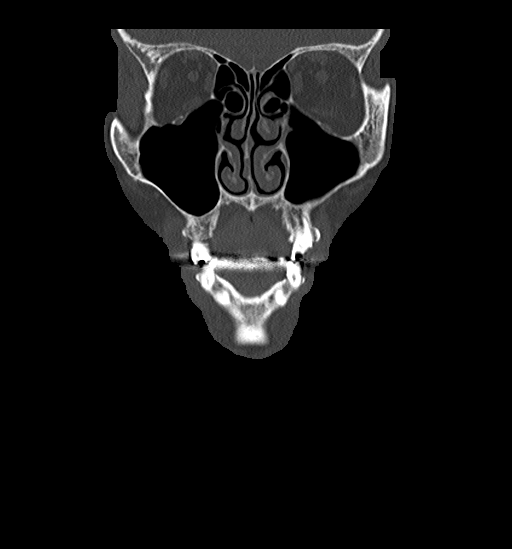
[im 48/119  bone]
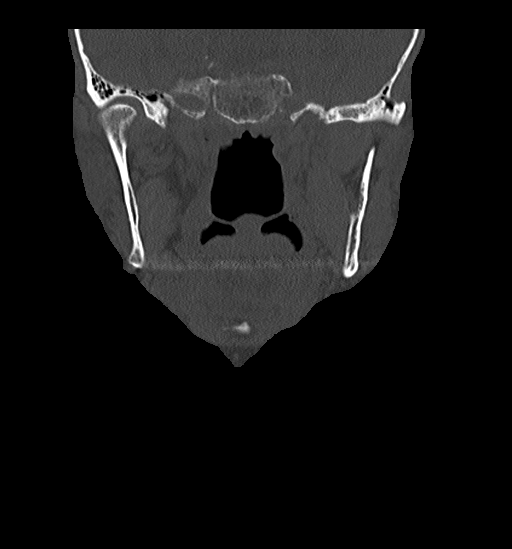
[im 71/119  bone]
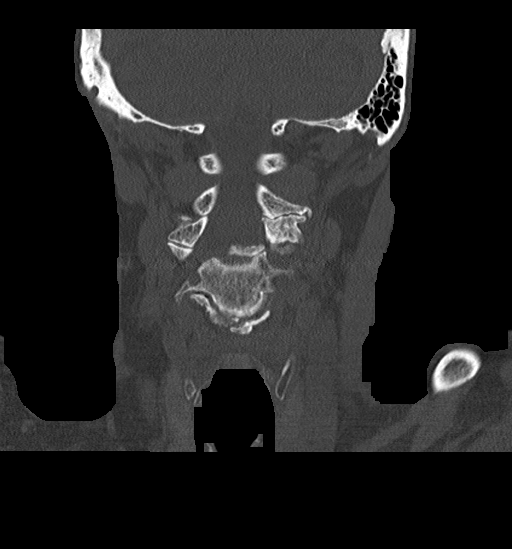

[Series 6: orthogonal bone · axial · 0.35mm/px · z∈[+281,+415]mm · 5 of 107 slices shown, 7 images]
[im 16/107  soft-tissue]
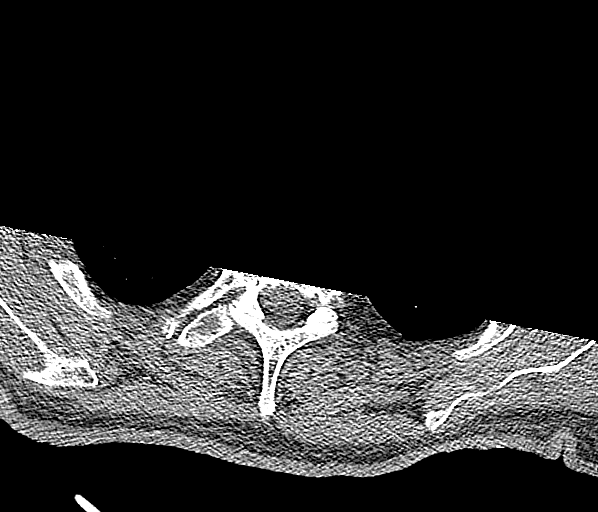
[im 16/107  bone]
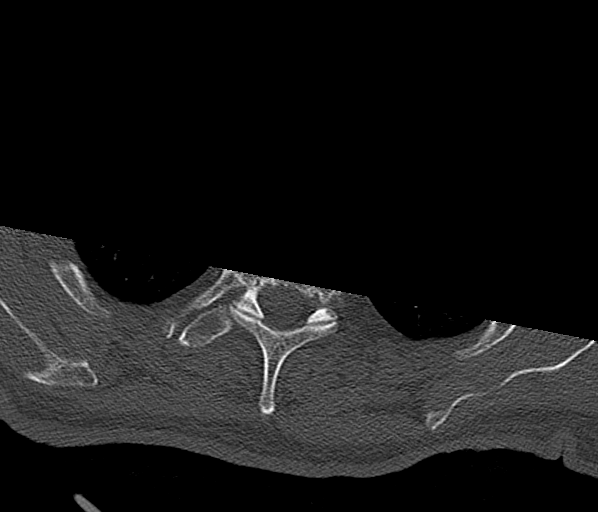
[im 31/107  bone]
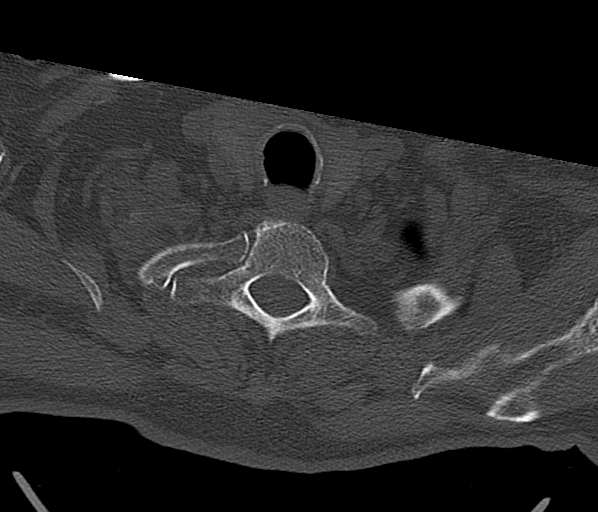
[im 61/107  bone]
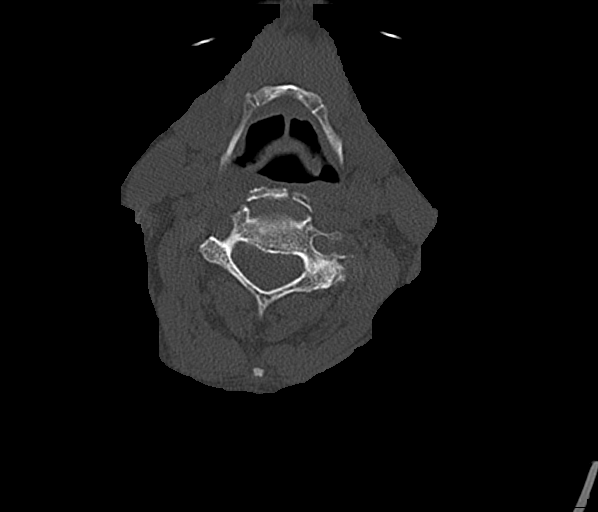
[im 76/107  bone]
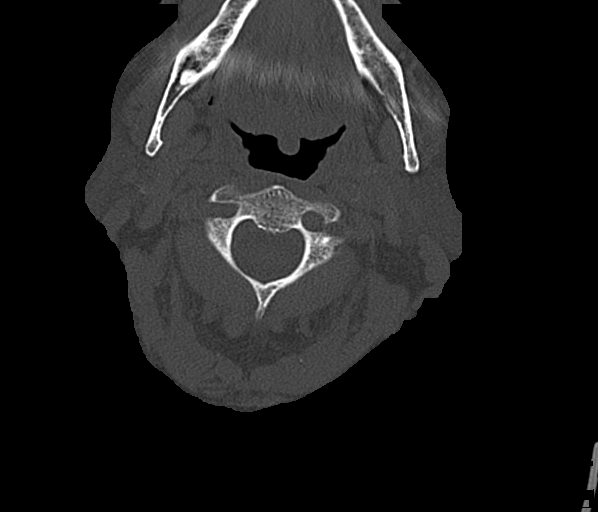
[im 91/107  soft-tissue]
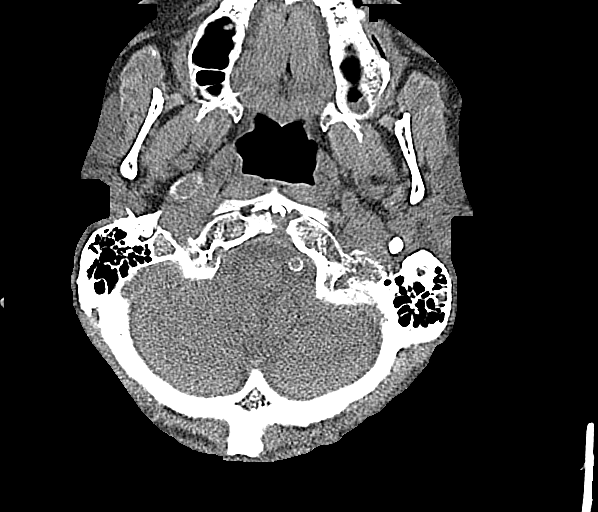
[im 91/107  bone]
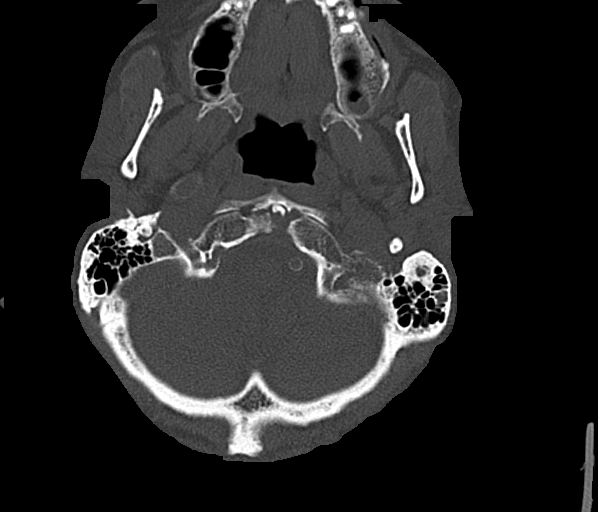

[13 of 35 positions shown; findings below may reference images not displayed]

FINDINGS: Alignment: Grade 1 anterolisthesis of C3 on C4 and C4 on C5. Mild
reversal of the normal lordotic curvature centered at C5 which is
likely chronic.

Skull base and vertebrae: No acute fracture. No primary bone lesion
or focal pathologic process.

Soft tissues and spinal canal: No prevertebral fluid or swelling. No
visible canal hematoma.

Disc levels: Moderate to severe intervertebral disc space narrowing
at C4-C5, C5-C6 and C6-C7. Associated uncovertebral spurring and
small dorsal endplate osteophytes. Bilateral facet arthropathy.
Multilevel neural foraminal narrowing. Likely spinal canal stenosis
at C4-C5.

Upper chest: Unremarkable.

Other: None.
IMPRESSION: Degenerative changes with no acute fracture or subluxation
identified.

## 2021-01-11 IMAGING — CR DG PELVIS 1-2V
2 series · 2 of 2 positions shown · non-contrast
Comparison: [DATE]

CLINICAL DATA: Pelvic and right hip pain.

EXAM:
PELVIS - 1-2 VIEW

[pelvis ap (1 of 2)]
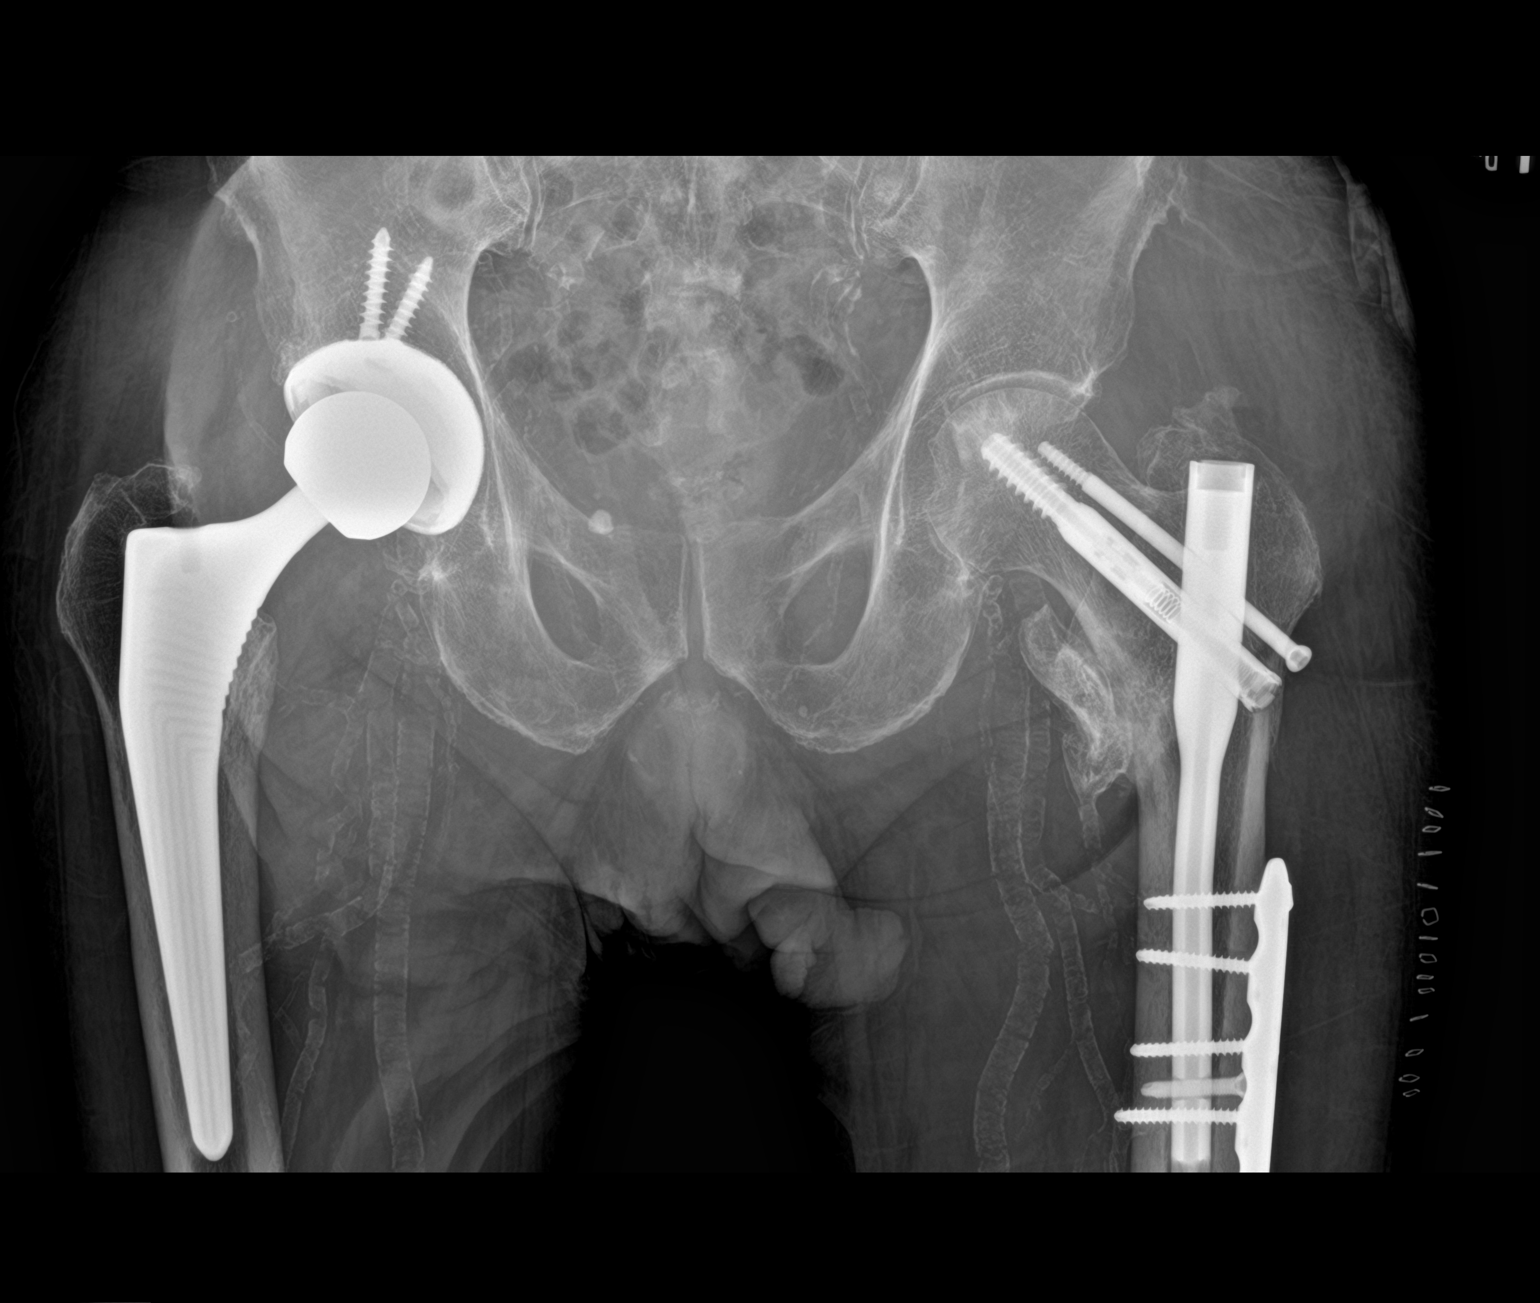

[pelvis ap (2 of 2)]
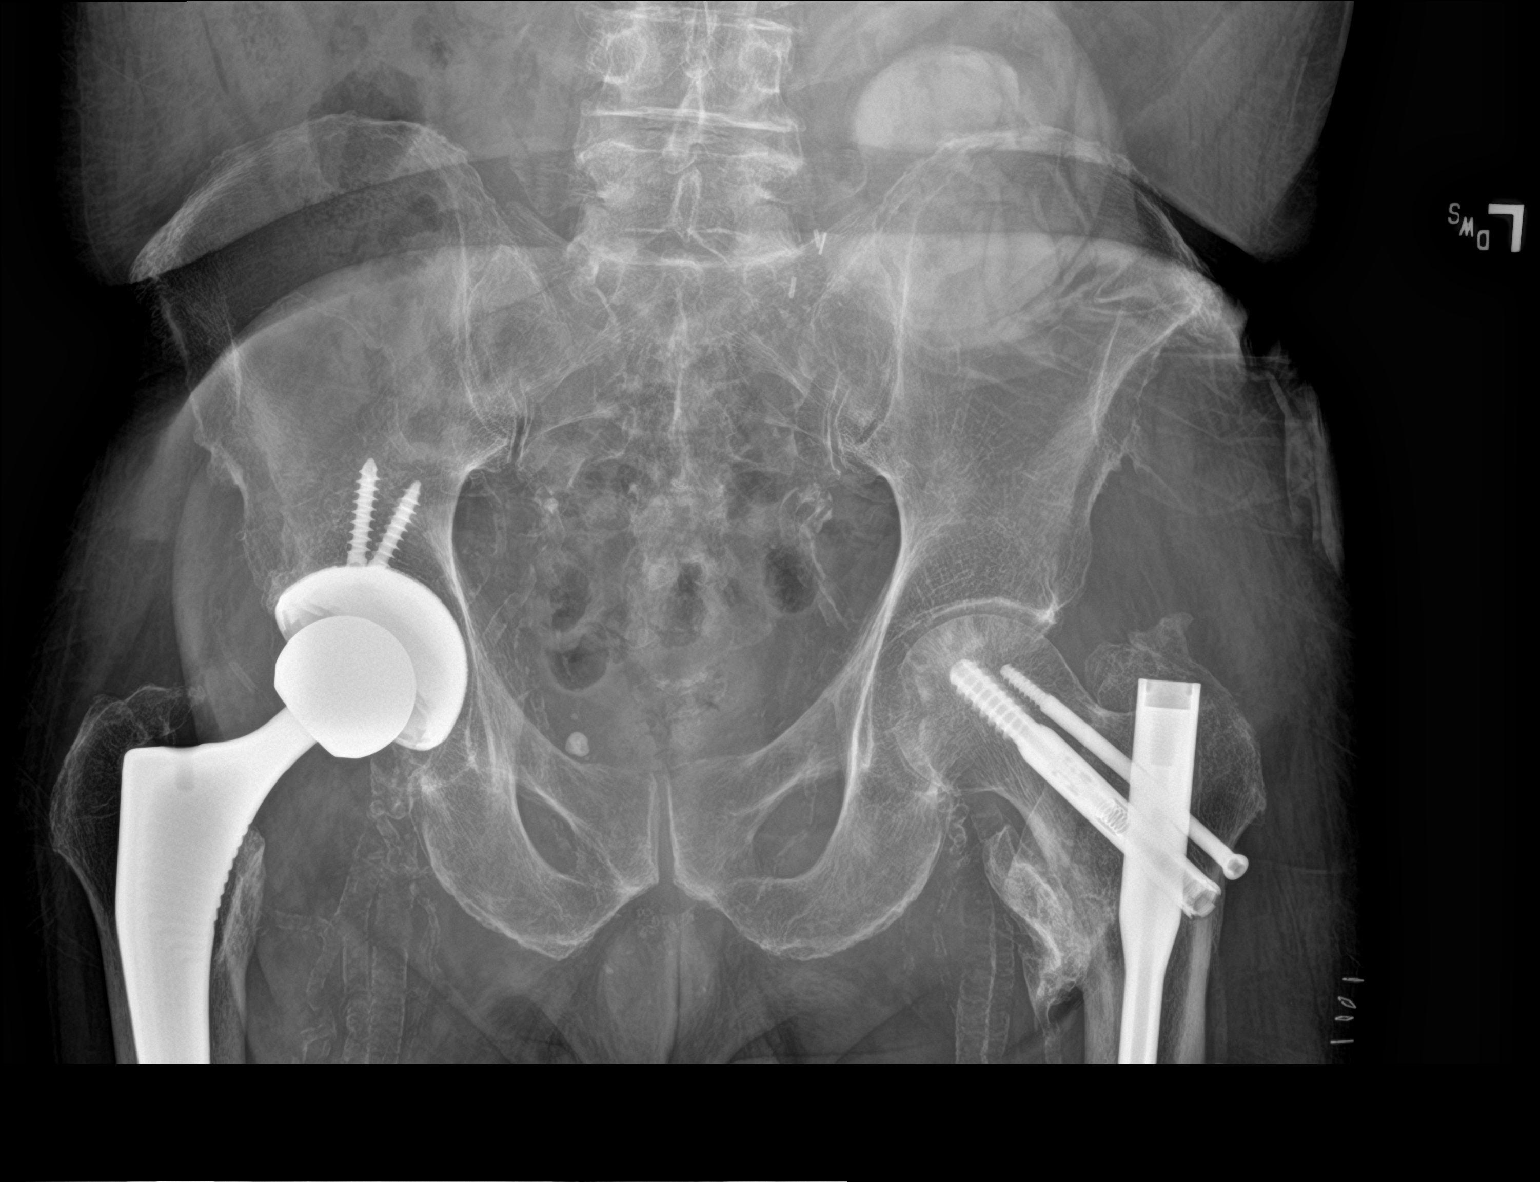

[2 of 2 positions shown; findings below may reference images not displayed]

FINDINGS: There is no evidence of pelvic fracture or diastasis. Generalized
osteopenia noted. No focal pelvic bone lesions are seen.

Bipolar right hip prosthesis is seen in appropriate position.
Internal fixation hardware is again seen in the left hip. Mild to
moderate left hip osteoarthritis noted. Extensive peripheral
vascular calcification also seen.
IMPRESSION: No acute findings.

Mild to moderate left hip osteoarthritis.

## 2021-01-11 IMAGING — CT CT KNEE*L* W/O CM
3 of 6 series · 10 of 33 positions shown, 11 images · non-contrast
Comparison: Radiographs today, [DATE] and [DATE].

CLINICAL DATA: Status post fall. Tibial plateau fracture. Status
post distal lateral femoral ORIF for a spiral fracture on
[DATE]. Previous proximal femoral ORIF and total knee
arthroplasty.

EXAM:
CT OF THE LEFT KNEE WITHOUT CONTRAST
TECHNIQUE: Multidetector CT imaging of the left knee was performed according to
the standard protocol. Multiplanar CT image reconstructions were
also generated.

[Series 4: axial bone · axial · 0.32mm/px · z∈[-605,-405]mm · 4 of 301 slices shown, 5 images]
[im 51/301  soft-tissue]
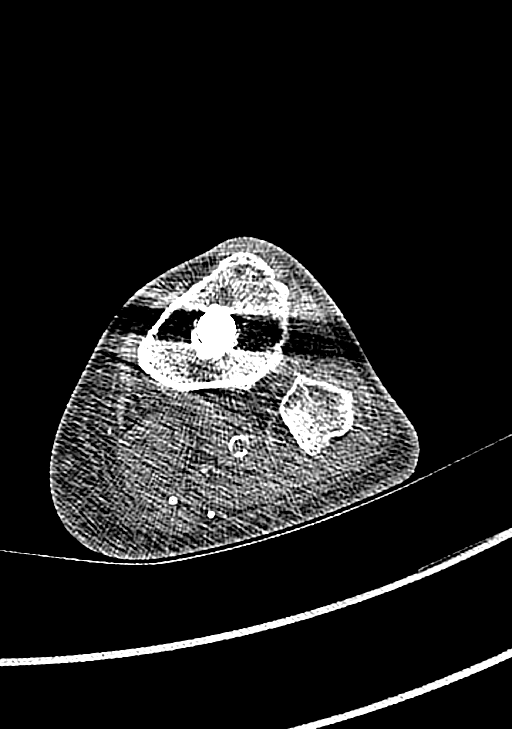
[im 51/301  bone]
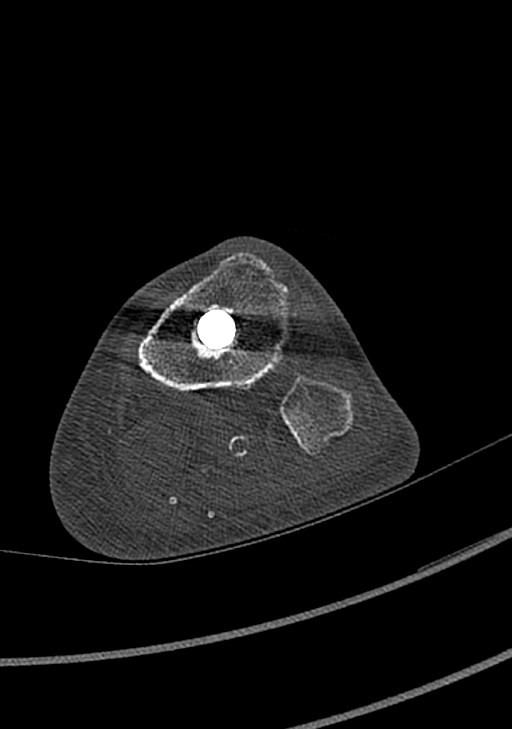
[im 101/301  bone]
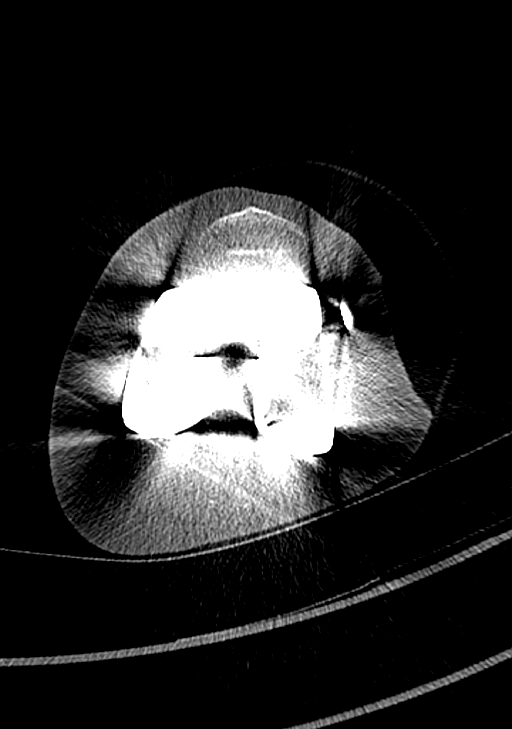
[im 201/301  bone]
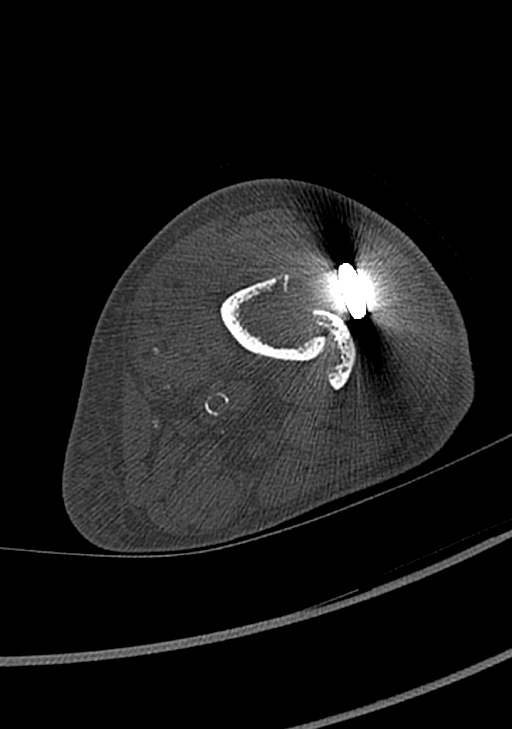
[im 251/301  bone]
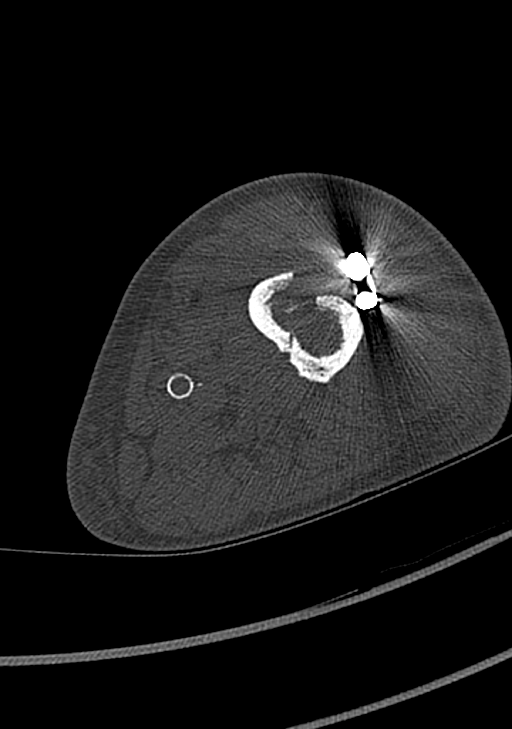

[Series 5: cor bone · coronal · 0.41mm/px · 1 of 152 slices shown]
[im 76/152  bone]
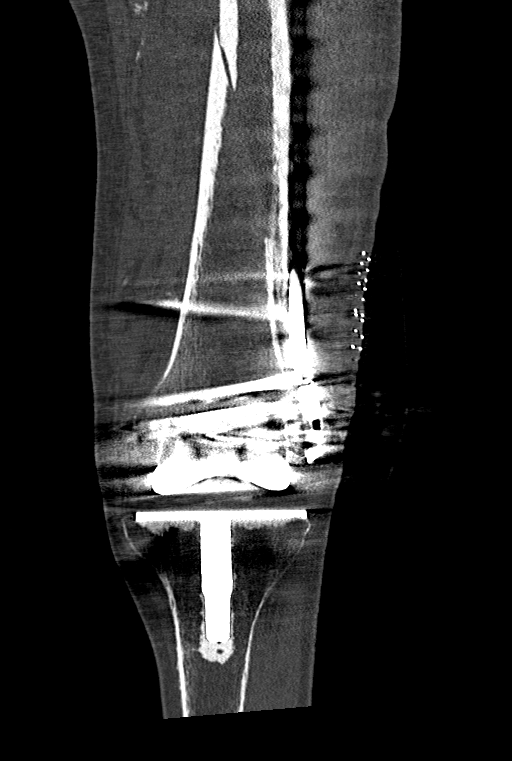

[Series 6: sag bone · sagittal · 0.30mm/px · 5 of 185 slices shown]
[im 31/185  bone]
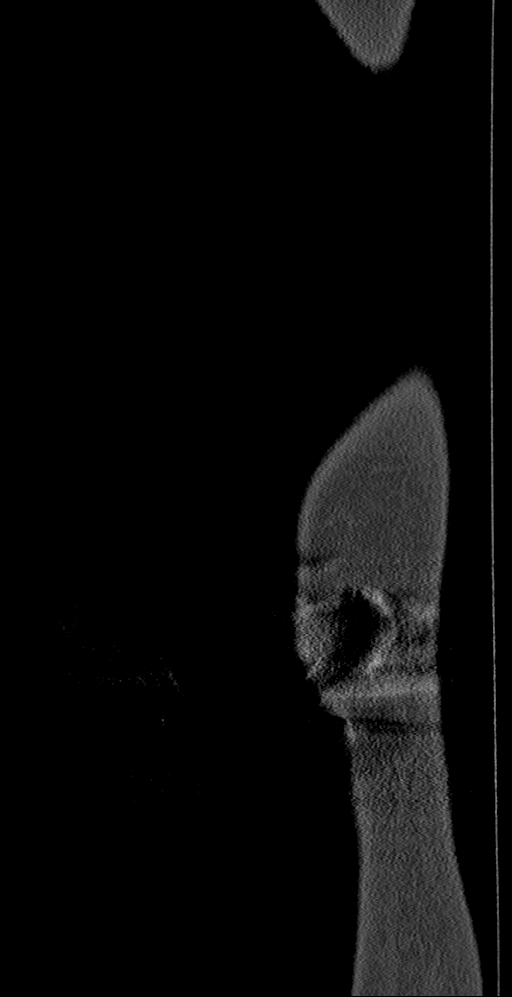
[im 62/185  bone]
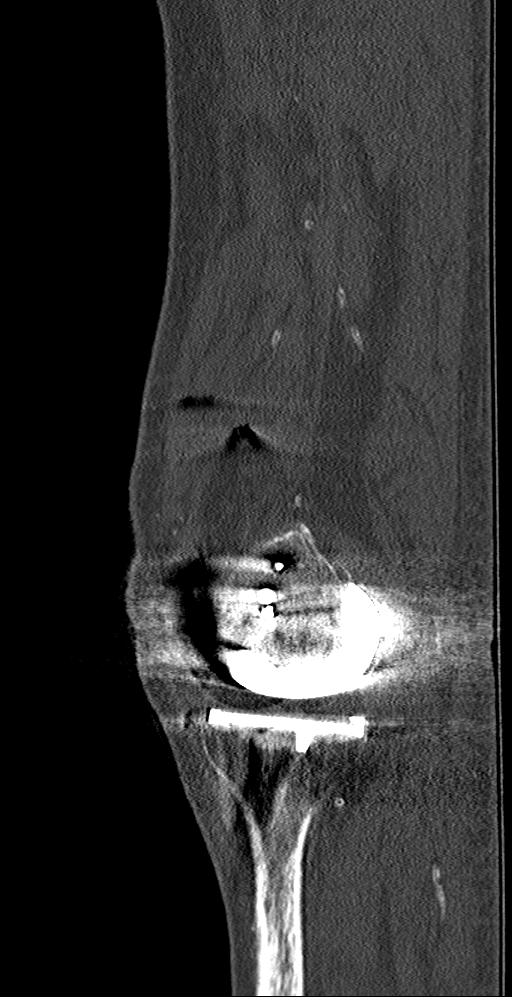
[im 93/185  bone]
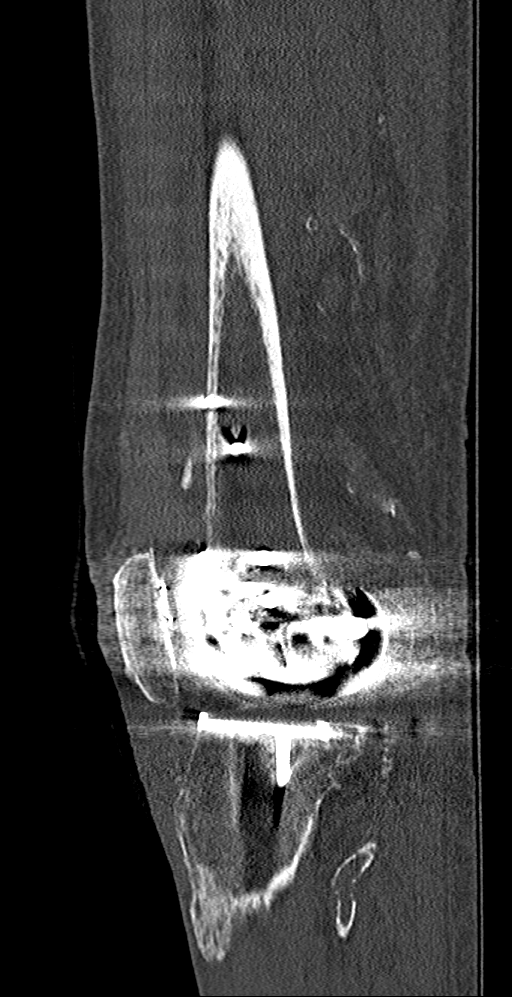
[im 123/185  bone]
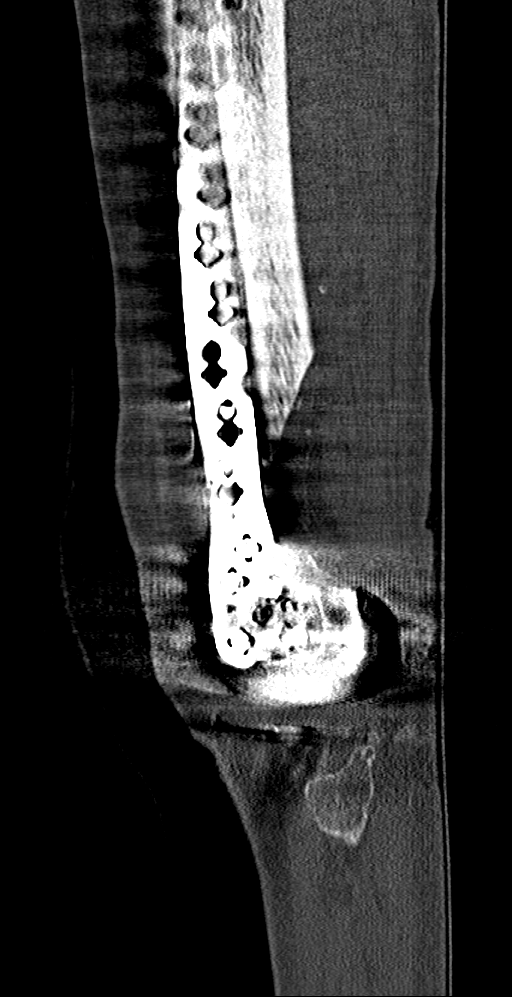
[im 154/185  bone]
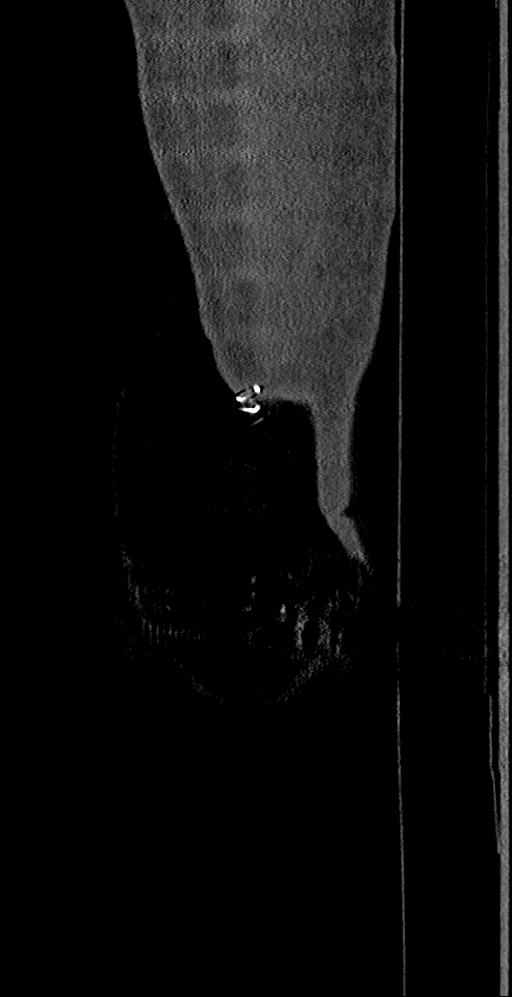

[10 of 33 positions shown; findings below may reference images not displayed]

FINDINGS: Bones/Joint/Cartilage

Images extend from the mid left thigh through the proximal lower
leg. Patient is status post left total knee arthroplasty. The
hardware creates moderate susceptibility artifact. No evidence of
hardware loosening or displacement. There has been recent placement
of a lateral distal femoral plate and screws, the superior extent of
which (including the proximal screws) is not imaged on this
examination of the knee. The most proximal of the 2 distal screws is
relatively anteriorly positioned with limited osseous purchase. The
mildly comminuted oblique fracture of the distal femoral diaphysis
demonstrates residual medial and anterior displacement by up to 11
mm which appears improved from the original radiographs of
[DATE], although may be slightly increased compared with the
intraoperative radiographs [DATE]. No new fractures are
identified.

Ligaments

Suboptimally assessed by CT.

Muscles and Tendons

No focal muscular hematoma or other fluid collection identified.
Evaluation at the level of the knee is limited by artifact.

Soft tissues

Extensive femoropopliteal atherosclerosis. Lateral skin staples are
present from the recent distal femoral ORIF. No unexpected foreign
bodies identified.
IMPRESSION: 1. Possible increased displacement of the oblique fracture of the
distal femoral diaphysis compared with the intraoperative
radiographs of [DATE]. The more proximal of the distal femoral
plate screws is anteriorly positioned with limited osseous purchase
in the femur and could be displaced. The proximal extent of the
femoral plate and screws is not imaged.
2. No new fractures are identified.
3. Previous total knee arthroplasty.  No dislocation.

## 2021-01-11 MED ORDER — METHOCARBAMOL 500 MG PO TABS
500.0000 mg | ORAL_TABLET | Freq: Four times a day (QID) | ORAL | Status: DC | PRN
  Administered 2021-01-28: 08:00:00 500 mg via ORAL
  Filled 2021-01-11 (×3): qty 1

## 2021-01-11 MED ORDER — INSULIN ASPART 100 UNIT/ML IJ SOLN
3.0000 [IU] | Freq: Three times a day (TID) | INTRAMUSCULAR | Status: DC
  Administered 2021-01-11 – 2021-01-14 (×8): 3 [IU] via SUBCUTANEOUS
  Filled 2021-01-11 (×8): qty 1

## 2021-01-11 MED ORDER — DULOXETINE HCL 20 MG PO CPEP
20.0000 mg | ORAL_CAPSULE | Freq: Every day | ORAL | Status: DC
  Administered 2021-01-11 – 2021-01-28 (×18): 20 mg via ORAL
  Filled 2021-01-11 (×19): qty 1

## 2021-01-11 MED ORDER — MELATONIN 5 MG PO TABS
2.5000 mg | ORAL_TABLET | Freq: Every evening | ORAL | Status: DC | PRN
  Administered 2021-01-19 – 2021-01-25 (×5): 2.5 mg via ORAL
  Filled 2021-01-11 (×5): qty 1

## 2021-01-11 MED ORDER — HYDROCODONE-ACETAMINOPHEN 5-325 MG PO TABS
1.0000 | ORAL_TABLET | Freq: Four times a day (QID) | ORAL | Status: DC | PRN
  Administered 2021-01-11 – 2021-01-12 (×2): 2 via ORAL
  Administered 2021-01-13 – 2021-01-15 (×5): 1 via ORAL
  Administered 2021-01-15: 2 via ORAL
  Administered 2021-01-15 – 2021-01-17 (×5): 1 via ORAL
  Administered 2021-01-17: 2 via ORAL
  Administered 2021-01-18: 05:00:00 1 via ORAL
  Administered 2021-01-18 (×2): 2 via ORAL
  Administered 2021-01-18: 05:00:00 1 via ORAL
  Administered 2021-01-19: 2 via ORAL
  Administered 2021-01-19: 1 via ORAL
  Administered 2021-01-20: 2 via ORAL
  Administered 2021-01-20: 1 via ORAL
  Administered 2021-01-21 (×3): 2 via ORAL
  Administered 2021-01-22: 1 via ORAL
  Administered 2021-01-22 – 2021-01-25 (×8): 2 via ORAL
  Administered 2021-01-25 – 2021-01-27 (×6): 1 via ORAL
  Filled 2021-01-11 (×2): qty 1
  Filled 2021-01-11 (×3): qty 2
  Filled 2021-01-11 (×2): qty 1
  Filled 2021-01-11: qty 2
  Filled 2021-01-11 (×3): qty 1
  Filled 2021-01-11: qty 2
  Filled 2021-01-11 (×2): qty 1
  Filled 2021-01-11: qty 2
  Filled 2021-01-11: qty 1
  Filled 2021-01-11 (×2): qty 2
  Filled 2021-01-11: qty 1
  Filled 2021-01-11: qty 2
  Filled 2021-01-11: qty 1
  Filled 2021-01-11 (×2): qty 2
  Filled 2021-01-11 (×4): qty 1
  Filled 2021-01-11 (×2): qty 2
  Filled 2021-01-11: qty 1
  Filled 2021-01-11: qty 2
  Filled 2021-01-11 (×2): qty 1
  Filled 2021-01-11: qty 2
  Filled 2021-01-11: qty 1
  Filled 2021-01-11 (×4): qty 2
  Filled 2021-01-11: qty 1
  Filled 2021-01-11: qty 2

## 2021-01-11 MED ORDER — APIXABAN 5 MG PO TABS
5.0000 mg | ORAL_TABLET | Freq: Two times a day (BID) | ORAL | Status: DC
  Administered 2021-01-11 – 2021-01-28 (×34): 5 mg via ORAL
  Filled 2021-01-11 (×35): qty 1

## 2021-01-11 MED ORDER — PANTOPRAZOLE SODIUM 40 MG PO TBEC
40.0000 mg | DELAYED_RELEASE_TABLET | Freq: Every day | ORAL | Status: DC
  Administered 2021-01-11 – 2021-01-28 (×18): 40 mg via ORAL
  Filled 2021-01-11 (×19): qty 1

## 2021-01-11 MED ORDER — ACETAMINOPHEN 325 MG PO TABS
650.0000 mg | ORAL_TABLET | Freq: Four times a day (QID) | ORAL | Status: DC | PRN
  Administered 2021-01-11 – 2021-01-27 (×9): 650 mg via ORAL
  Filled 2021-01-11 (×11): qty 2

## 2021-01-11 MED ORDER — ACETAMINOPHEN 325 MG PO TABS
650.0000 mg | ORAL_TABLET | Freq: Once | ORAL | Status: AC
  Administered 2021-01-11: 11:00:00 650 mg via ORAL
  Filled 2021-01-11: qty 2

## 2021-01-11 MED ORDER — TAMSULOSIN HCL 0.4 MG PO CAPS
0.4000 mg | ORAL_CAPSULE | Freq: Every day | ORAL | Status: DC
  Administered 2021-01-11 – 2021-01-28 (×18): 0.4 mg via ORAL
  Filled 2021-01-11 (×19): qty 1

## 2021-01-11 MED ORDER — SENNOSIDES-DOCUSATE SODIUM 8.6-50 MG PO TABS
1.0000 | ORAL_TABLET | Freq: Every evening | ORAL | Status: DC | PRN
  Administered 2021-01-23 – 2021-01-25 (×2): 1 via ORAL
  Filled 2021-01-11 (×2): qty 1

## 2021-01-11 MED ORDER — GLUCERNA SHAKE PO LIQD
237.0000 mL | Freq: Two times a day (BID) | ORAL | Status: DC
  Administered 2021-01-14 – 2021-01-26 (×11): 237 mL via ORAL

## 2021-01-11 MED ORDER — POLYETHYLENE GLYCOL 3350 17 GM/SCOOP PO POWD
17.0000 g | Freq: Every day | ORAL | Status: DC
  Filled 2021-01-11: qty 255

## 2021-01-11 MED ORDER — DOCUSATE SODIUM 100 MG PO CAPS
100.0000 mg | ORAL_CAPSULE | Freq: Two times a day (BID) | ORAL | Status: DC
  Administered 2021-01-11 – 2021-01-28 (×35): 100 mg via ORAL
  Filled 2021-01-11 (×38): qty 1

## 2021-01-11 MED ORDER — ENSURE MAX PROTEIN PO LIQD
11.0000 [oz_av] | Freq: Every day | ORAL | Status: DC
  Administered 2021-01-14 – 2021-01-27 (×7): 11 [oz_av] via ORAL
  Filled 2021-01-11: qty 330

## 2021-01-11 MED ORDER — ATORVASTATIN CALCIUM 20 MG PO TABS
20.0000 mg | ORAL_TABLET | Freq: Every day | ORAL | Status: DC
  Administered 2021-01-11 – 2021-01-27 (×18): 20 mg via ORAL
  Filled 2021-01-11 (×20): qty 1

## 2021-01-11 MED ORDER — HYDROCODONE-ACETAMINOPHEN 5-325 MG PO TABS
1.0000 | ORAL_TABLET | Freq: Once | ORAL | Status: AC
  Administered 2021-01-11: 11:00:00 1 via ORAL
  Filled 2021-01-11: qty 1

## 2021-01-11 MED ORDER — BISACODYL 10 MG RE SUPP
10.0000 mg | Freq: Every day | RECTAL | Status: DC | PRN
  Filled 2021-01-11: qty 1

## 2021-01-11 MED ORDER — ONDANSETRON HCL 4 MG PO TABS: 4.0000 mg | ORAL_TABLET | Freq: Four times a day (QID) | ORAL | Status: DC | PRN

## 2021-01-11 MED ORDER — POLYETHYLENE GLYCOL 3350 17 G PO PACK
17.0000 g | PACK | Freq: Every day | ORAL | Status: DC
  Administered 2021-01-12 – 2021-01-28 (×16): 17 g via ORAL
  Filled 2021-01-11 (×16): qty 1

## 2021-01-11 MED ORDER — HYDROCODONE-ACETAMINOPHEN 5-325 MG PO TABS
1.0000 | ORAL_TABLET | Freq: Four times a day (QID) | ORAL | Status: DC | PRN
  Filled 2021-01-11: qty 1

## 2021-01-11 NOTE — ED Notes (Signed)
Pt provided dinner tray, pt colostomy bag burped, pt repositioned. VS cycling. Pt denies any needs at this time

## 2021-01-11 NOTE — ED Triage Notes (Signed)
Pt via EMS from home. Pt had surgery on his L femur on 11/15. Pt had doing well but on Wednesday he had a mechanical fall and since then he has now increased pain and unable to bear weight. Denies LOC. Denies head injury. Pt is on Eliquis Pt is A&OX4 and NAD.

## 2021-01-11 NOTE — ED Provider Notes (Signed)
Emergency Medicine Provider Triage Evaluation Note  Corey Gonzalez , a 80 y.o. male  was evaluated in triage.  Pt complains of left knee and thigh pain after a fall yesterday.  Patient recently had surgery on 12/31/2020 by Dr. Rudene Christians due to a left distal periprosthetic fracture.  Patient states that this is the site of his pain overlying the left thigh and proximal portion of the left knee.  Patient denies any head trauma or loss of consciousness  Review of Systems  Positive: Left knee and thigh pain Negative: Weakness/numbness/paresthesias in the left lower extremity  Physical Exam  BP 138/79 (BP Location: Right Arm)   Pulse 87   Temp 98 F (36.7 C) (Oral)   Resp 16   Ht 5\' 7"  (1.702 m)   Wt 58.5 kg   SpO2 97%   BMI 20.20 kg/m  Gen:   Awake, no distress Resp:  Normal effort MSK:   Swelling overlying left knee joint with surgical dressing in place and significant tenderness to palpation as well as pain with any range of motion Other:  Calm, cooperative, oriented  Medical Decision Making  Medically screening exam initiated at 9:15 AM.  Appropriate orders placed.  Kathleene Hazel was informed that the remainder of the evaluation will be completed by another provider, this initial triage assessment does not replace that evaluation, and the importance of remaining in the ED until their evaluation is complete.     Naaman [name], MD 01/11/21 [phone]

## 2021-01-11 NOTE — ED Provider Notes (Signed)
[facility] Emergency Department Provider Note  ____________________________________________   Event Date/Time   First MD Initiated Contact with Patient 01/11/21 1050     (approximate)  I have reviewed the triage vital signs and the nursing notes.   HISTORY  Chief Complaint Fall   HPI Corey Gonzalez is a 80 y.o. 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  Patient states that he was doing fairly well using a walker after being discharged but felt on 11/23.  States he thinks he lost his balance and fell backwards.  He is not sure if he hit his head or not but states he has only had pain in the left knee since.  The pain developed later that day of the fall and he has not been evaluated since then.  States he stopped taking his oxycodone prescribed pain medicine because because he feels it is making him constipated.         Past Medical History:  Diagnosis Date   Diabetes mellitus without complication ([place])    Hypertension     Patient Active Problem List   Diagnosis Date Noted   COVID-19 virus infection 01/05/2021   Malnutrition of moderate degree 01/02/2021   Fall at home, initial encounter 12/30/2020   Tobacco abuse 12/30/2020   Gastro-esophageal reflux disease without esophagitis 12/30/2020   Femur fracture, left ([place]) 12/30/2020   Closed left hip fracture, initial encounter ([place]) 09/14/2019   Diabetes mellitus without complication ([place])    Chronic kidney disease, stage 3a ([place])    Hypertension    Alcohol dependence ([place])     Past Surgical History:  Procedure Laterality Date   INTRAMEDULLARY (IM) NAIL INTERTROCHANTERIC Left 09/15/2019   Procedure: INTRAMEDULLARY (IM) NAIL  INTERTROCHANTRIC;  Surgeon: Leim Fabry, MD;  Location: [facility] ORS;  Service: Orthopedics;  Laterality: Left;   JOINT REPLACEMENT     bilateral knees   JOINT REPLACEMENT Right    hip   ORIF FEMUR FRACTURE Left 12/31/2020   Procedure: OPEN REDUCTION INTERNAL FIXATION (ORIF) DISTAL FEMUR FRACTURE - PERIPROSTHETIC FRACTURE;  Surgeon: Hessie Knows, MD;  Location: [facility] ORS;  Service: Orthopedics;  Laterality: Left;    Prior to Admission medications   Medication Sig Start Date End Date Taking? Authorizing Provider  acetaminophen (TYLENOL) 325 MG tablet Take 650 mg by mouth every 6 (six) hours as needed. 03/20/18   [provider]  apixaban (ELIQUIS) 5 MG TABS tablet Take 5 mg by mouth 2 (two) times daily.    [provider]  atorvastatin (LIPITOR) 20 MG tablet Take 20 mg by mouth at bedtime. 09/17/20   [provider]  bisacodyl (DULCOLAX) 10 MG suppository Place 1 suppository (10 mg total) rectally daily as needed for moderate constipation. 09/18/19   Georgette Shell, MD  docusate sodium (COLACE) 100 MG capsule Take 1 capsule (100 mg total) by mouth 2 (two) times daily. 09/18/19   Georgette Shell, MD  DULoxetine (CYMBALTA) 20 MG capsule Take 20 mg by mouth daily.    [provider]  Ensure Max Protein (ENSURE MAX PROTEIN) LIQD Take 330 mLs (11 oz total) by mouth at bedtime. 01/05/21   Annita Brod, MD  feeding supplement, [place], ([place])  LIQD Take 237 mLs by mouth 2 (two) times daily between meals. 01/05/21   Annita Brod, MD  HYDROcodone-acetaminophen (NORCO/VICODIN) 5-325 MG tablet Take 1-2 tablets by mouth every 6 (six) hours as needed for moderate pain. 01/02/21   Duanne Guess, PA-C  insulin regular (NOVOLIN R) 100 units/mL injection Inject 3 Units into the skin 3 (three) times daily before meals.    [provider]  Melatonin 3 MG CAPS Take 3 mg by mouth at bedtime as needed.    [provider]  metFORMIN  (GLUCOPHAGE) 500 MG tablet Take 1 tablet (500 mg total) by mouth daily with breakfast. 09/18/19 09/17/20  Georgette Shell, MD  methocarbamol (ROBAXIN) 500 MG tablet Take 1 tablet (500 mg total) by mouth every 6 (six) hours as needed for muscle spasms. 01/05/21   Annita Brod, MD  Multiple Vitamins-Minerals (MULTIVITAMIN ADULT, MINERALS,) TABS Take 1 tablet by mouth daily. Patient not taking: No sig reported 05/27/20   [provider]  omeprazole (PRILOSEC) 20 MG capsule Take 20 mg by mouth daily.    [provider]  ondansetron (ZOFRAN) 4 MG tablet Take 1 tablet (4 mg total) by mouth every 6 (six) hours as needed for nausea. 09/16/19   Reche Dixon, PA-C  polyethylene glycol powder (GLYCOLAX/MIRALAX) 17 GM/SCOOP powder Take 17 g by mouth daily. MIX 17GM (1 CAPFUL) BY MOUTH ONCE EVERY DAY MIX 17 GRAMS (USE BOTTLE CAP) IN LIQUID OF CHOICE AS DIRECTED TO PREVENT CONSTIPATION 09/17/20   [provider]  senna-docusate (SENOKOT-S) 8.6-50 MG tablet Take 1 tablet by mouth at bedtime as needed for mild constipation. 09/18/19   Georgette Shell, MD  tamsulosin (FLOMAX) 0.4 MG CAPS capsule Take 0.4 mg by mouth daily.    [provider]    Allergies Patient has no known allergies.  Family History  Problem Relation Age of Onset   Diabetes Other     Social History Social History   Tobacco Use   Smoking status: Some Days   Smokeless tobacco: Never   Tobacco comments:    ~5 per week  Substance Use Topics   Alcohol use: Yes    Alcohol/week: 35.0 standard drinks    Types: 35 Standard drinks or equivalent per week    Comment: 3 mixed (liquor) drinks nightly    Review of Systems  Review of Systems  Constitutional:  Negative for chills and fever.  HENT:  Negative for sore throat.   Eyes:  Negative for pain.  Respiratory:  Negative for cough and stridor.   Cardiovascular:  Negative for chest pain.  Gastrointestinal:  Negative for vomiting.  Genitourinary:   Negative for dysuria.  Musculoskeletal:  Positive for joint pain (L knee) and myalgias (L knee).  Skin:  Negative for rash.  Neurological:  Negative for seizures, loss of consciousness and headaches.  Psychiatric/Behavioral:  Negative for suicidal ideas.   All other systems reviewed and are negative.    ____________________________________________   PHYSICAL EXAM:  VITAL SIGNS: ED Triage Vitals  Enc Vitals Group     BP 01/11/21 0906 138/79     Pulse Rate 01/11/21 0906 87     Resp 01/11/21 0906 16     Temp 01/11/21 0906 98 F (36.7 C)     Temp Source 01/11/21 0906 Oral     SpO2 01/11/21 0906 97 %     Weight 01/11/21 0906 129 lb (58.5 kg)     Height 01/11/21 0906 5\' 7"  (1.702 m)  Head Circumference --      Peak Flow --      Pain Score 01/11/21 0912 4     Pain Loc --      Pain Edu? --      Excl. in [place]? --    Vitals:   01/11/21 1400 01/11/21 1500  BP: (!) 144/78 130/64  Pulse: 79 77  Resp:    Temp:    SpO2:     Physical Exam Vitals and nursing note reviewed.  Constitutional:      General: He is not in acute distress.    Appearance: He is well-developed.  HENT:     Head: Normocephalic and atraumatic.  Eyes:     Conjunctiva/sclera: Conjunctivae normal.  Cardiovascular:     Rate and Rhythm: Normal rate and regular rhythm.     Heart sounds: No murmur heard. Pulmonary:     Effort: Pulmonary effort is normal. No respiratory distress.     Breath sounds: Normal breath sounds.  Abdominal:     Palpations: Abdomen is soft.     Tenderness: There is no abdominal tenderness.  Musculoskeletal:        General: No swelling.     Cervical back: Neck supple.  Skin:    General: Skin is warm and dry.     Capillary Refill: Capillary refill takes less than 2 seconds.  Neurological:     Mental Status: He is alert.  Psychiatric:        Mood and Affect: Mood normal.    No tenderness step-offs or deformities over the C/T/L-spine.  Cranial nerves II through XII are grossly  intact.  Patient has full strength of his bilateral upper extremities and throughout the right lower extremity.  He has limited range of motion and severe pain on passive range of motion of the left knee.  There is no large effusion and staples are in place over the left knee incision site and proximal left lateral hip areas where patient had recent surgery.  These appear clean dry and intact.  No surrounding induration erythema or purulent drainage.  Patient is able to move his toes and flexor and dorsi plantar his left foot with full strength compared to the right.  2+ DP pulses.  Sensation is intact to light touch throughout all extremities.  Lungs are clear bilaterally and abdomen soft nontender throughout.  Colostomy is in place with brown stool in place. ____________________________________________   LABS (all labs ordered are listed, but only abnormal results are displayed)  Labs Reviewed  URINALYSIS, COMPLETE (UACMP) WITH MICROSCOPIC - Abnormal; Notable for the following components:      Result Value   Leukocytes,Ua SMALL (*)    All other components within normal limits  CBC - Abnormal; Notable for the following components:   RBC 3.30 (*)    Hemoglobin 10.0 (*)    HCT 32.6 (*)    All other components within normal limits   ____________________________________________  EKG  EKG shows sinus rhythm with a ventricular rate of 74, normal axis, unremarkable intervals with nonspecific change in lead III and aVF without other clear evidence of acute ischemia or significant arrhythmia. These inversions noted in previous EKG in lead III on 11/22. ____________________________________________  RADIOLOGY  ED MD interpretation:    CT head remarkable for no evidence of skull fracture, intracranial hemorrhage, ischemia or mass-effect.  There are some chronic changes without any other acute process noted.  CT C-spine remarkable for some chronic degenerative changes but no acute fracture  or  subluxation.  Plain film of the pelvis remarkable for shows no acute findings in the left hip evidence of osteoarthritis.  Plan for left femur and left knee shows old left knee arthroplasty and proximal right femoral nailing as well as recent ORIF of the distal left femur and previously oblique diaphyseal fracture.  There is no other acute fracture or osseous changes noted.  CT of the left knee shows the possibly increased displacement of the oblique fracture of distal femoral diaphysis without any new fractures or dislocations noted.  Ultrasound of left lower extremity shows no evidence of a DVT.  Official radiology report(s): DG Pelvis 1-2 Views  Result Date: 01/11/2021 CLINICAL DATA:  Pelvic and right hip pain. EXAM: PELVIS - 1-2 VIEW COMPARISON:  12/30/2020 FINDINGS: There is no evidence of pelvic fracture or diastasis. Generalized osteopenia noted. No focal pelvic bone lesions are seen. Bipolar right hip prosthesis is seen in appropriate position. Internal fixation hardware is again seen in the left hip. 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 Extensive peripheral vascular calcification also seen. IMPRESSION: No acute findings. 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 Electronically Signed   By: Marlaine Hind M.D.   On: 01/11/2021 12:17   CT HEAD WO CONTRAST (5MM)  Result Date: 01/11/2021 CLINICAL DATA:  Head trauma EXAM: CT HEAD WITHOUT CONTRAST TECHNIQUE: Contiguous axial images were obtained from the base of the skull through the vertex without intravenous contrast. COMPARISON:  None. FINDINGS: Brain: No acute intracranial hemorrhage, mass effect, or herniation. No extra-axial fluid collections. No evidence of acute territorial infarct. No hydrocephalus. Moderate cortical volume loss. Patchy hypodensities in the periventricular and subcortical white matter, likely secondary to chronic microvascular ischemic changes. Vascular: Calcified plaques in the carotid siphons and distal  vertebral arteries. Skull: Normal. Negative for fracture or focal lesion. Sinuses/Orbits: No acute finding. Other: None. IMPRESSION: Chronic changes with no acute intracranial process identified. Electronically Signed   By: Ofilia Neas M.D.   On: 01/11/2021 12:08   CT Cervical Spine Wo Contrast  Result Date: 01/11/2021 CLINICAL DATA:  Neck trauma EXAM: CT CERVICAL SPINE WITHOUT CONTRAST TECHNIQUE: Multidetector CT imaging of the cervical spine was performed without intravenous contrast. Multiplanar CT image reconstructions were also generated. COMPARISON:  None. FINDINGS: Alignment: Grade 1 anterolisthesis of C3 on C4 and C4 on C5. Mild reversal of the normal lordotic curvature centered at C5 which is likely chronic. Skull base and vertebrae: No acute fracture. No primary bone lesion or focal pathologic process. Soft tissues and spinal canal: No prevertebral fluid or swelling. No visible canal hematoma. Disc levels: Moderate to severe intervertebral disc space narrowing at C4-C5, C5-C6 and C6-C7. Associated uncovertebral spurring and small dorsal endplate osteophytes. Bilateral facet arthropathy. Multilevel neural foraminal narrowing. Likely spinal canal stenosis at C4-C5. Upper chest: Unremarkable. Other: None. IMPRESSION: Degenerative changes with no acute fracture or subluxation identified. Electronically Signed   By: Ofilia Neas M.D.   On: 01/11/2021 12:11   CT Knee Left Wo Contrast  Result Date: 01/11/2021 CLINICAL DATA:  Status post fall. Tibial plateau fracture. Status post distal lateral femoral ORIF for a spiral fracture on 12/31/2020. Previous proximal femoral ORIF and total knee arthroplasty. EXAM: CT OF THE LEFT KNEE WITHOUT CONTRAST TECHNIQUE: Multidetector CT imaging of the left knee was performed according to the standard protocol. Multiplanar CT image reconstructions were also generated. COMPARISON:  Radiographs today, 12/30/2020 and 09/15/2019. FINDINGS: Bones/Joint/Cartilage  Images extend from the mid left thigh through the proximal lower leg. Patient is  status post left total knee arthroplasty. The hardware creates moderate susceptibility artifact. No evidence of hardware loosening or displacement. There has been recent placement of a lateral distal femoral plate and screws, the superior extent of which (including the proximal screws) is not imaged on this examination of the knee. The most proximal of the 2 distal screws is relatively anteriorly positioned with limited osseous purchase. The mildly comminuted oblique fracture of the distal femoral diaphysis demonstrates residual medial and anterior displacement by up to 11 mm which appears improved from the original radiographs of 12/30/2020, although may be slightly increased compared with the intraoperative radiographs 12/31/2020. No new fractures are identified. Ligaments Suboptimally assessed by CT. Muscles and Tendons No focal muscular hematoma or other fluid collection identified. Evaluation at the level of the knee is limited by artifact. Soft tissues Extensive femoropopliteal atherosclerosis. Lateral skin staples are present from the recent distal femoral ORIF. No unexpected foreign bodies identified. IMPRESSION: 1. Possible increased displacement of the oblique fracture of the distal femoral diaphysis compared with the intraoperative radiographs of 12/31/2020. The more proximal of the distal femoral plate screws is anteriorly positioned with limited osseous purchase in the femur and could be displaced. The proximal extent of the femoral plate and screws is not imaged. 2. No new fractures are identified. 3. Previous total knee arthroplasty.  No dislocation. Electronically Signed   By: Richardean Sale M.D.   On: 01/11/2021 11:56   US Venous Img Lower Unilateral Left  Result Date: 01/11/2021 CLINICAL DATA:  Left lower extremity pain. History of colon cancer. Evaluate for DVT. EXAM: LEFT LOWER EXTREMITY VENOUS DOPPLER  ULTRASOUND TECHNIQUE: Gray-scale sonography with graded compression, as well as color Doppler and duplex ultrasound were performed to evaluate the lower extremity deep venous systems from the level of the common femoral vein and including the common femoral, femoral, profunda femoral, popliteal and calf veins including the posterior tibial, peroneal and gastrocnemius veins when visible. The superficial great saphenous vein was also interrogated. Spectral Doppler was utilized to evaluate flow at rest and with distal augmentation maneuvers in the common femoral, femoral and popliteal veins. COMPARISON:  None. FINDINGS: Contralateral Common Femoral Vein: Respiratory phasicity is normal and symmetric with the symptomatic side. No evidence of thrombus. Normal compressibility. Common Femoral Vein: No evidence of thrombus. Normal compressibility, respiratory phasicity and response to augmentation. Saphenofemoral Junction: No evidence of thrombus. Normal compressibility and flow on color Doppler imaging. Profunda Femoral Vein: No evidence of thrombus. Normal compressibility and flow on color Doppler imaging. Femoral Vein: No evidence of thrombus. Normal compressibility, respiratory phasicity and response to augmentation. Popliteal Vein: No evidence of thrombus. Normal compressibility, respiratory phasicity and response to augmentation. Calf Veins: No evidence of thrombus. Normal compressibility and flow on color Doppler imaging. Superficial Great Saphenous Vein: No evidence of thrombus. Normal compressibility. Venous Reflux:  None. Other Findings: Extensive echogenic atherosclerotic plaque noted throughout the incidentally imaged arterial vasculature of the left lower extremity. IMPRESSION: No evidence of DVT within the left lower extremity. Electronically Signed   By: Sandi Mariscal M.D.   On: 01/11/2021 13:19   DG Knee Complete 4 Views Left  Result Date: 01/11/2021 CLINICAL DATA:  [place] yesterday day, continued knee pain  EXAM: LEFT KNEE - COMPLETE 4+ VIEW COMPARISON:  12/30/2020 FINDINGS: Osseous demineralization. LEFT knee prosthesis and lateral femoral plate/screws identified. Distal femoral diaphyseal fracture again seen. No additional fracture, dislocation, or bone destruction. Extensive atherosclerotic calcifications. 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 Recent ORIF of oblique  distal femoral diaphyseal fracture. No new abnormalities. Electronically Signed   By: Lavonia Dana M.D.   On: 01/11/2021 10:28   DG FEMUR MIN 2 VIEWS LEFT  Result Date: 01/11/2021 CLINICAL DATA:  [place] 4 days ago, worsened LEFT leg pain EXAM: LEFT FEMUR 2 VIEWS COMPARISON:  12/31/2020 FINDINGS: LEFT knee prosthesis in expected position. IM nail LEFT femur with proximal compression screws. Lateral plate and multiple screws of mid to distal LEFT femur across a recent oblique distal femoral diaphyseal fracture. Marked osseous demineralization. LEFT hip joint space narrowing. No new fracture, dislocation, or bone destruction. Extensive atherosclerotic calcifications. Hardware appears intact. 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 Recent ORIF of distal LEFT femoral oblique diaphyseal fracture. No new osseous abnormalities. Electronically Signed   By: Lavonia Dana M.D.   On: 01/11/2021 10:30    ____________________________________________   PROCEDURES  Procedure(s) performed (including Critical Care):  Procedures   ____________________________________________   INITIAL IMPRESSION / ASSESSMENT AND PLAN / ED COURSE        Patient presents with listed history exam for assessment of fairly significant left knee pain since he had a fall on 11/23 after being discharged couple days prior for left hip fracture.  Seems at that time patient patient had been recommended to go to a SNF while he refuses citing some financial concerns.  He states that he fell because he thinks he lost  his balance and was not using his walker.  He states he also has not taken less of his pain medicine because of concerns for constipation.  He is denying any other acute symptoms or interim falls.  However he notes he has been fairly weak in his recliner for the last 3 days using a urinal to pee.  Friend has been giving him food.  On arrival today he is afebrile and hemodynamically stable.  Abdomen pain and decreased range of motion at the left knee does not seem to have other evidence of any significant trauma on exam.  Primary differential includes possible acute fracture, increased displacement, contusion and possible DVT.  Patient is fairly adamant that he lost his balance because he was using his walker and did not have any other symptoms preceding the fall.  Or lower suspicion for presyncope symptoms at this time or significant metabolic derangements or other acute infectious process.  He does not appear infected on exam.  Given patient reports he is not sure if he hit his head or not and his age I did obtain a CT head and C-spine.  We will initial plain film of the left knee shows possible increased distal placement of previously noted femur fracture but no acute knee fracture I did also obtain a CT of left knee to rule out possible occult will plateau fracture.  CT head remarkable for no evidence of skull fracture, intracranial hemorrhage, ischemia or mass-effect.  There are some chronic changes without any other acute process noted.  CT C-spine remarkable for some chronic degenerative changes but no acute fracture or subluxation.  Plain film of the pelvis remarkable for shows no acute findings in the left hip evidence of osteoarthritis.  Plan for left femur and left knee shows old left knee arthroplasty and proximal right femoral nailing as well as recent ORIF of the distal left femur and previously oblique diaphyseal fracture.  There is no other acute fracture or osseous changes noted.  CT of  the left knee shows the possibly increased displacement of the oblique  fracture of distal femoral diaphysis without any new fractures or dislocations noted.  Ultrasound of left lower extremity shows no evidence of a DVT.  Discussed above imaging and presentation with on-call orthopedist Dr Roland Rack who reviewed imaging and recommended no additional orthopedic interventions or diagnostic studies at this time with recommendation to remain nonweightbearing on that left lower extremity.  EKG shows sinus rhythm with a ventricular rate of 74, normal axis, unremarkable intervals with nonspecific change in lead III and aVF without other clear evidence of acute ischemia or significant arrhythmia. These inversions noted in previous EKG in lead III on 11/22.  Low suspicion for ACS at this time.  At this time again believe patient requires hospitalization although after discussion with patient review of records it seems he had any significantly more help at home or assistance getting into a SNF.  Will place in border status and consult TOC.  All medications reordered.   ____________________________________________   FINAL CLINICAL IMPRESSION(S) / ED DIAGNOSES  Final diagnoses:  Closed fracture of left femur, unspecified fracture morphology, sequela  Fall, initial encounter    Medications  senna-docusate (Senokot-S) tablet 1 tablet (has no administration in time range)  tamsulosin (FLOMAX) capsule 0.4 mg (0.4 mg Oral Given 01/11/21 1328)  ondansetron (ZOFRAN) tablet 4 mg (has no administration in time range)  pantoprazole (PROTONIX) EC tablet 40 mg (40 mg Oral Given 01/11/21 1328)  methocarbamol (ROBAXIN) tablet 500 mg (has no administration in time range)  protein supplement (ENSURE MAX) liquid (has no administration in time range)  feeding supplement (GLUCERNA SHAKE) (GLUCERNA SHAKE) liquid 237 mL (has no administration in time range)  DULoxetine (CYMBALTA) DR capsule 20 mg (20 mg Oral Given  01/11/21 1328)  bisacodyl (DULCOLAX) suppository 10 mg (has no administration in time range)  docusate sodium (COLACE) capsule 100 mg (100 mg Oral Given 01/11/21 1328)  atorvastatin (LIPITOR) tablet 20 mg (has no administration in time range)  acetaminophen (TYLENOL) tablet 650 mg (has no administration in time range)  apixaban (ELIQUIS) tablet 5 mg (has no administration in time range)  insulin aspart (novoLOG) injection 3 Units (has no administration in time range)  melatonin tablet 2.5 mg (has no administration in time range)  HYDROcodone-acetaminophen (NORCO/VICODIN) 5-325 MG per tablet 1-2 tablet (has no administration in time range)  polyethylene glycol (MIRALAX / GLYCOLAX) packet 17 g (has no administration in time range)  HYDROcodone-acetaminophen (NORCO/VICODIN) 5-325 MG per tablet 1 tablet (1 tablet Oral Given 01/11/21 1116)  acetaminophen (TYLENOL) tablet 650 mg (650 mg Oral Given 01/11/21 1116)     ED Discharge Orders     None        Note:  This document was prepared using Dragon voice recognition software and may include unintentional dictation errors.    Lucrezia Starch, MD 01/11/21 [phone]

## 2021-01-11 NOTE — ED Notes (Signed)
Pt ostomy bag leaking. Entire system replaced and cleaned.

## 2021-01-11 NOTE — ED Notes (Signed)
Pt states he had mechanical fall and has been increasingly unable to bear weight on left leg. Pt given supplied to empty ostomy bag.

## 2021-01-12 LAB — CBG MONITORING, ED
Glucose-Capillary: 195 mg/dL — ABNORMAL HIGH (ref 70–99)
Glucose-Capillary: 191 mg/dL — ABNORMAL HIGH (ref 70–99)
Glucose-Capillary: 141 mg/dL — ABNORMAL HIGH (ref 70–99)

## 2021-01-12 NOTE — TOC Progression Note (Signed)
Transition of Care [facility]) - Progression Note    Patient Details  Name: Corey Gonzalez MRN: [id number] Date of Birth: Jun 18, 1940  Transition of Care [facility]) CM/SW [place], RN Phone Number: 01/12/2021, 4:07 PM  Clinical Narrative:    Notified by ED RN patient requesting to go to rehab instead of home. Understands he will need 3k upfront to facility and states that is possible. RN CM will outreach to facility to confirm bed availability.         Expected Discharge Plan and Services                                                 Social Determinants of Health (SDOH) Interventions    Readmission Risk Interventions No flowsheet data found.

## 2021-01-12 NOTE — ED Notes (Signed)
Patient given phone. Patient denies any needs at this time.

## 2021-01-12 NOTE — TOC Progression Note (Signed)
Transition of Care [facility]) - Progression Note    Patient Details  Name: Corey Gonzalez MRN: [id number] Date of Birth: 1940-07-22  Transition of Care [facility]) CM/SW Contact  Anselm Pancoast, RN Phone Number: 01/12/2021, 1:06 PM  Clinical Narrative:    Confirmed with Cathy @ [place] patient not eligible for [facility] via [place] however that process takes up to 2 weeks and they will not cover patient to stay in hospital to wait. If patient interested in additional aide hours clinicals including PT/OT can be faxed to [phone].         Expected Discharge Plan and Services                                                 Social Determinants of Health (SDOH) Interventions    Readmission Risk Interventions No flowsheet data found.

## 2021-01-12 NOTE — NC FL2 (Deleted)
[place] LEVEL OF CARE SCREENING TOOL     IDENTIFICATION  Patient Name: Corey Gonzalez Birthdate: Oct 18, 1940 Sex: male Admission Date (Current Location): 01/11/2021  [facility] and [place] Number:  Engineering geologist and Address:  [facility], [address], [place], [place] [address]      Provider Number: [id number]  Attending Physician Name and Address:  No att. providers found  Relative Name and Phone Number:       Current Level of Care: Hospital Recommended Level of Care: [place] Prior Approval Number:    Date Approved/Denied:   PASRR Number: [id number]   Discharge Plan: SNF    Current Diagnoses: Patient Active Problem List   Diagnosis Date Noted   COVID-19 virus infection 01/05/2021   Malnutrition of moderate degree 01/02/2021   Fall at home, initial encounter 12/30/2020   Tobacco abuse 12/30/2020   Gastro-esophageal reflux disease without esophagitis 12/30/2020   Femur fracture, left ([place]) 12/30/2020   Closed left hip fracture, initial encounter ([place]) 09/14/2019   Diabetes mellitus without complication ([place])    Chronic kidney disease, stage 3a ([place])    Hypertension    Alcohol dependence ([place])     Orientation RESPIRATION BLADDER Height & Weight     Self, Time, Place, Situation  Normal Continent Weight: 58.5 kg Height:  5\' 7"  (170.2 cm)  BEHAVIORAL SYMPTOMS/MOOD NEUROLOGICAL BOWEL NUTRITION STATUS      Colostomy Diet  AMBULATORY STATUS COMMUNICATION OF NEEDS Skin   Extensive Assist Verbally Normal                       Personal Care Assistance Level of Assistance  Bathing, Dressing Bathing Assistance: Limited assistance   Dressing Assistance: Limited assistance     Functional Limitations Info             [place]  PT (By licensed PT), OT (By licensed OT)     PT Frequency: min 5xweek OT Frequency: min 5xweek            Contractures      Additional  Factors Info                  Current Medications (01/12/2021):  This is the current hospital active medication list Current Facility-Administered Medications  Medication Dose Route Frequency Provider Last Rate Last Admin   acetaminophen (TYLENOL) tablet 650 mg  650 mg Oral Q6H PRN Lucrezia Starch, MD   650 mg at 01/11/21 2145   apixaban (ELIQUIS) tablet 5 mg  5 mg Oral BID Lucrezia Starch, MD   5 mg at 01/12/21 1116   atorvastatin (LIPITOR) tablet 20 mg  20 mg Oral QHS Lucrezia Starch, MD   20 mg at 01/11/21 2145   bisacodyl (DULCOLAX) suppository 10 mg  10 mg Rectal Daily PRN Lucrezia Starch, MD       docusate sodium (COLACE) capsule 100 mg  100 mg Oral BID Lucrezia Starch, MD   100 mg at 01/12/21 1037   DULoxetine (CYMBALTA) DR capsule 20 mg  20 mg Oral Daily Lucrezia Starch, MD   20 mg at 01/12/21 1036   feeding supplement (GLUCERNA SHAKE) (GLUCERNA SHAKE) liquid 237 mL  237 mL Oral BID BM Lucrezia Starch, MD       HYDROcodone-acetaminophen (NORCO/VICODIN) 5-325 MG per tablet 1-2 tablet  1-2 tablet Oral Q6H PRN Dorothe Pea, RPH   2 tablet at 01/11/21  2145   insulin aspart (novoLOG) injection 3 Units  3 Units Subcutaneous TID AC Lucrezia Starch, MD   3 Units at 01/12/21 1117   melatonin tablet 2.5 mg  2.5 mg Oral QHS PRN Lucrezia Starch, MD       methocarbamol (ROBAXIN) tablet 500 mg  500 mg Oral Q6H PRN Lucrezia Starch, MD       ondansetron [facility]) tablet 4 mg  4 mg Oral Q6H PRN Lucrezia Starch, MD       pantoprazole (PROTONIX) EC tablet 40 mg  40 mg Oral Daily Lucrezia Starch, MD   40 mg at 01/12/21 1037   polyethylene glycol (MIRALAX / GLYCOLAX) packet 17 g  17 g Oral Daily Dorothe Pea, RPH   17 g at 01/12/21 1037   protein supplement (ENSURE MAX) liquid  11 oz Oral QHS Lucrezia Starch, MD       senna-docusate (Senokot-S) tablet 1 tablet  1 tablet Oral QHS PRN Lucrezia Starch, MD       tamsulosin [facility]) capsule 0.4 mg  0.4 mg Oral Daily Lucrezia Starch, MD    0.4 mg at 01/12/21 1037   Current Outpatient Medications  Medication Sig Dispense Refill   DULoxetine (CYMBALTA) 20 MG capsule Take 20 mg by mouth daily.     insulin regular (NOVOLIN R) 100 units/mL injection Inject 3 Units into the skin 3 (three) times daily before meals.     acetaminophen (TYLENOL) 325 MG tablet Take 650 mg by mouth every 6 (six) hours as needed.     apixaban (ELIQUIS) 5 MG TABS tablet Take 5 mg by mouth 2 (two) times daily.     atorvastatin (LIPITOR) 20 MG tablet Take 20 mg by mouth at bedtime.     bisacodyl (DULCOLAX) 10 MG suppository Place 1 suppository (10 mg total) rectally daily as needed for moderate constipation. 12 suppository 0   docusate sodium (COLACE) 100 MG capsule Take 1 capsule (100 mg total) by mouth 2 (two) times daily. 10 capsule 0   Ensure Max Protein (ENSURE MAX PROTEIN) LIQD Take 330 mLs (11 oz total) by mouth at bedtime.     feeding supplement, GLUCERNA SHAKE, (GLUCERNA SHAKE) LIQD Take 237 mLs by mouth 2 (two) times daily between meals.  0   HYDROcodone-acetaminophen (NORCO/VICODIN) 5-325 MG tablet Take 1-2 tablets by mouth every 6 (six) hours as needed for moderate pain. 30 tablet 0   Melatonin 3 MG CAPS Take 3 mg by mouth at bedtime as needed.     metFORMIN (GLUCOPHAGE) 500 MG tablet Take 1 tablet (500 mg total) by mouth daily with breakfast. 30 tablet 11   methocarbamol (ROBAXIN) 500 MG tablet Take 1 tablet (500 mg total) by mouth every 6 (six) hours as needed for muscle spasms. 30 tablet 1   Multiple Vitamins-Minerals (MULTIVITAMIN ADULT, MINERALS,) TABS Take 1 tablet by mouth daily. (Patient not taking: No sig reported)     omeprazole (PRILOSEC) 20 MG capsule Take 20 mg by mouth daily.     ondansetron (ZOFRAN) 4 MG tablet Take 1 tablet (4 mg total) by mouth every 6 (six) hours as needed for nausea. 20 tablet 0   polyethylene glycol powder (GLYCOLAX/MIRALAX) 17 GM/SCOOP powder Take 17 g by mouth daily. MIX 17GM (1 CAPFUL) BY MOUTH ONCE EVERY DAY  MIX 17 GRAMS (USE BOTTLE CAP) IN LIQUID OF CHOICE AS DIRECTED TO PREVENT CONSTIPATION     senna-docusate (SENOKOT-S) 8.6-50 MG tablet Take 1 tablet by  mouth at bedtime as needed for mild constipation.     tamsulosin (FLOMAX) 0.4 MG CAPS capsule Take 0.4 mg by mouth daily.       Discharge Medications: Please see discharge summary for a list of discharge medications.  Relevant Imaging Results:  Relevant Lab Results:   Additional Information [id number]  Anselm Pancoast, RN

## 2021-01-12 NOTE — ED Notes (Signed)
Patient given cola at this time as requested.

## 2021-01-12 NOTE — Evaluation (Signed)
Physical Therapy Evaluation Patient Details Name: Corey Gonzalez MRN: [id number] DOB: November 13, 1940 Today's Date: 01/12/2021  History of Present Illness  Pt is a 80 y.o. male presenting to hospital 11/27 c/o L knee and thigh pain s/p fall 11/23.  Of note, pt s/p L ORIF distal femur fx/periprosthetic fx 12/31/20; (+) COVID-19 11/15; colon resection in September with SNF stay.  CT of the left knee shows the possibly increased displacement of the oblique fracture of distal femoral diaphysis without any new fractures or dislocations noted.  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  Clinical Impression  Prior to coming to ED, pt was most recently ambulating short distances in home with 4ww; lives with friend (who can NOT physically assist pt) in 1 level home with ramp to enter (although pt reports he can not propel w/c up ramp on his own).  Currently pt is CGA to min assist with bed mobility; min to mod assist with transfers (sit to stand using RW pt unsteady requiring assist for balance; squat pivot stretcher bed to/from chair with mod assist); and able to "hop" 1 small step forwards and backwards with RW mod assist (requires assist for balance).  Vc's required for [place] status and transfer technique during session.  Pt would benefit from skilled PT to address noted impairments and functional limitations (see below for any additional details).  Upon hospital discharge, pt would benefit from SNF.  If pt does not discharge to SNF, pt would require 24/7 physical assist for safe transfers/mobility (w/c level d/t NWB'ing L LE status) and HHPT.   Recommendations for follow up therapy are one component of a multi-disciplinary discharge planning process, led by the attending physician.  Recommendations may be updated based on patient status, additional functional criteria and insurance authorization.  Follow Up  Recommendations Skilled nursing-short term rehab (<3 hours/day)    Assistance Recommended at Discharge Frequent or constant Supervision/Assistance  Functional Status Assessment Patient has had a recent decline in their functional status and demonstrates the ability to make significant improvements in function in a reasonable and predictable amount of time.  Equipment Recommendations  Rolling walker (2 wheels);BSC/3in1;Wheelchair (measurements PT);Wheelchair cushion (measurements PT)    Recommendations for Other Services OT consult     Precautions / Restrictions Precautions Precautions: Fall Precaution Comments: LLQ colostomy; Limit L knee range of motion to 0-90 Restrictions Weight Bearing Restrictions: Yes LLE Weight Bearing: Non weight bearing Other Position/Activity Restrictions: Per Dr. Thompson Caul note 11/27 the on call orthopedist (Dr. Roland Rack) recommending NWB L LE.      Mobility  Bed Mobility Overal bed mobility: Needs Assistance Bed Mobility: Supine to Sit;Sit to Supine     Supine to sit: Min assist (assist for L LE) Sit to supine: Min guard   General bed mobility comments: increased effort to perform as much on own as possible    Transfers Overall transfer level: Needs assistance Equipment used: Rolling walker (2 wheels);None Transfers: Sit to/from Stand Sit to Stand: Min assist;Mod assist;From elevated surface (assist to stand from taller stretcher bed)     Squat pivot transfers: Mod assist (stretcher bed to/from chair)     General transfer comment: pt unsteady standing with RW (d/t NWB'ing status) requiring min to mod assist for balance; vc's for UE/LE placement and overall technique for transfers required    Ambulation/Gait Ambulation/Gait assistance: Mod assist Gait Distance (Feet):  (pt able to hop 1 small  step forward and then backwards) Assistive device: Rolling walker (2 wheels)   Gait velocity: decreased     General Gait Details: limited gait d/t  unsteadiness requiring mod assist for balance  Stairs            Wheelchair Mobility    Modified Rankin (Stroke Patients Only)       Balance Overall balance assessment: Needs assistance Sitting-balance support: No upper extremity supported;Feet supported Sitting balance-Leahy Scale: Good Sitting balance - Comments: steady sitting reaching within BOS   Standing balance support: Bilateral upper extremity supported;Reliant on assistive device for balance;During functional activity Standing balance-Leahy Scale: Poor Standing balance comment: pt unsteady in standing (with NWB'ing status) requiring assist for balance (even with RW use)                             Pertinent Vitals/Pain Pain Score: 3  Pain Location: L mid/distal thigh Pain Descriptors / Indicators: Aching;Sore;Tender Pain Intervention(s): Limited activity within patient's tolerance;Monitored during session;Repositioned    Home Living Family/patient expects to be discharged to:: Private residence Living Arrangements: Spouse/significant other (pt's girlfriend (h/o stroke 2019)) Available Help at Discharge: Family;Available PRN/intermittently Type of Home: House Home Access: Ramped entrance       Home Layout: One level Home Equipment: Conservation officer, nature (2 wheels);Rollator (4 wheels);BSC/3in1;Grab bars - toilet;Grab bars - tub/shower;Shower seat;Wheelchair - manual Additional Comments: Pt's significant other unable to physically assist pt.    Prior Function Prior Level of Function : Independent/Modified Independent             Mobility Comments: Pt was ambulating with 4ww prior to current hospitalization.       Hand Dominance        Extremity/Trunk Assessment   Upper Extremity Assessment Upper Extremity Assessment: Generalized weakness    Lower Extremity Assessment LLE Deficits / Details: at least 3-/5 AROM hip flexion and ankle DF/PF; L knee flexion AROM grossly 60 degrees (limited  d/t L knee pain) LLE: Unable to fully assess due to pain    Cervical / Trunk Assessment Cervical / Trunk Assessment: Normal  Communication   Communication: HOH  Cognition Arousal/Alertness: Awake/alert Behavior During Therapy: WFL for tasks assessed/performed Overall Cognitive Status: Within Functional Limits for tasks assessed                                 General Comments: A&O x4        General Comments  Nursing cleared pt for participation in physical therapy.  Pt agreeable to PT session.     Exercises  Transfers and gait training   Assessment/Plan    PT Assessment Patient needs continued PT services  PT Problem List Decreased strength;Decreased range of motion;Decreased activity tolerance;Decreased balance;Decreased mobility;Decreased knowledge of use of DME;Decreased knowledge of precautions;Pain       PT Treatment Interventions DME instruction;Gait training;Functional mobility training;Therapeutic activities;Therapeutic exercise;Balance training;Patient/family education;Wheelchair mobility training    PT Goals (Current goals can be found in the Care Plan section)  Acute Rehab PT Goals Patient Stated Goal: to improve mobility PT Goal Formulation: With patient Time For Goal Achievement: 01/29/21 Potential to Achieve Goals: Good    Frequency 7X/week   Barriers to discharge Decreased caregiver support      Co-evaluation               AM-PAC PT "6 Clicks" Mobility  Outcome Measure Help needed  turning from your back to your side while in a flat bed without using bedrails?: A Little Help needed moving from lying on your back to sitting on the side of a flat bed without using bedrails?: A Little Help needed moving to and from a bed to a chair (including a wheelchair)?: A Lot Help needed standing up from a chair using your arms (e.g., wheelchair or bedside chair)?: A Lot Help needed to walk in hospital room?: A Lot Help needed climbing 3-5  steps with a railing? : Total 6 Click Score: 13    End of Session Equipment Utilized During Treatment: Gait belt Activity Tolerance: Patient limited by fatigue Patient left: with call bell/phone within reach (in stretcher bed in lowest position and B railings up) Nurse Communication: Mobility status;Precautions;Weight bearing status PT Visit Diagnosis: Unsteadiness on feet (R26.81);Other abnormalities of gait and mobility (R26.89);Repeated falls (R29.6);Muscle weakness (generalized) (M62.81);History of falling (Z91.81);Difficulty in walking, not elsewhere classified (R26.2);Pain Pain - Right/Left: Left Pain - part of body: Knee    Time: 1350-1416 PT Time Calculation (min) (ACUTE ONLY): 26 min   Charges:   PT Evaluation $PT Eval Low Complexity: 1 Low PT Treatments $Therapeutic Activity: 8-22 mins       Leitha Bleak, PT 01/12/21, 3:02 PM

## 2021-01-12 NOTE — ED Notes (Signed)
Patient states he is wanting to go to rehab and does not feel he can safely go home at this time. Patient wanting to speak to social work again. Message sent to SW at this time.

## 2021-01-12 NOTE — TOC Progression Note (Signed)
Transition of Care [facility]) - Progression Note    Patient Details  Name: Corey Gonzalez MRN: [id number] Date of Birth: 23-Sep-1940  [facility]) CM/SW Contact  Anselm Pancoast, RN Phone Number: 01/12/2021, 1:33 PM  Clinical Narrative:    Spoke to patient at bedside and confirmed patient will discharge back home with [facility]. RN CM will outreach to [place] @ [place] to set up additional aide services. Patient has girlfriend and family that assists at home. Sister lives in [place]. [facility] understands [facility]. Confirmed patient has ramp and wheelchair/walker including rollator.          Expected Discharge Plan and Services                                                 Social Determinants of Health (SDOH) Interventions    Readmission Risk Interventions No flowsheet data found.

## 2021-01-12 NOTE — NC FL2 (Addendum)
[place] LEVEL OF CARE SCREENING TOOL     IDENTIFICATION  Patient Name: Corey Gonzalez Birthdate: [birth year] Sex: male Admission Date (Current Location): 01/11/2021  [facility] and [place] Number:  Engineering geologist and Address:  [facility], [address] [place], [place] [address]      Provider Number: [id number]  Attending Physician Name and Address:  No att. providers found  Relative Name and Phone Number:       Current Level of Care: Hospital Recommended Level of Care: [place] Prior Approval Number:    Date Approved/Denied:   PASRR Number:    Discharge Plan: SNF    Current Diagnoses: Patient Active Problem List   Diagnosis Date Noted   COVID-19 virus infection 01/05/2021   Malnutrition of moderate degree 01/02/2021   Fall at home, initial encounter 12/30/2020   Tobacco abuse 12/30/2020   Gastro-esophageal reflux disease without esophagitis 12/30/2020   Femur fracture, left ([place]) 12/30/2020   Closed left hip fracture, initial encounter ([place]) 09/14/2019   Diabetes mellitus without complication ([place])    Chronic kidney disease, stage 3a ([place])    Hypertension    Alcohol dependence ([place])     Orientation RESPIRATION BLADDER Height & Weight     Self, Time, Place, Situation  Normal Continent Weight: 58.5 kg Height:  5\' 7"  (170.2 cm)  BEHAVIORAL SYMPTOMS/MOOD NEUROLOGICAL BOWEL NUTRITION STATUS      Colostomy Diet  AMBULATORY STATUS COMMUNICATION OF NEEDS Skin   Extensive Assist Verbally Normal                       Personal Care Assistance Level of Assistance  Bathing, Dressing Bathing Assistance: Limited assistance   Dressing Assistance: Limited assistance     Functional Limitations Info             [place]  PT (By licensed PT), OT (By licensed OT)     PT Frequency: min 5xweek OT Frequency: min 5xweek            Contractures      Additional Factors Info                   Current Medications (01/12/2021):  This is the current hospital active medication list Current Facility-Administered Medications  Medication Dose Route Frequency Provider Last Rate Last Admin   acetaminophen (TYLENOL) tablet 650 mg  650 mg Oral Q6H PRN Lucrezia Starch, MD   650 mg at 01/11/21 2145   apixaban (ELIQUIS) tablet 5 mg  5 mg Oral BID Lucrezia Starch, MD   5 mg at 01/12/21 1116   atorvastatin (LIPITOR) tablet 20 mg  20 mg Oral QHS Lucrezia Starch, MD   20 mg at 01/11/21 2145   bisacodyl (DULCOLAX) suppository 10 mg  10 mg Rectal Daily PRN Lucrezia Starch, MD       docusate sodium (COLACE) capsule 100 mg  100 mg Oral BID Lucrezia Starch, MD   100 mg at 01/12/21 1037   DULoxetine (CYMBALTA) DR capsule 20 mg  20 mg Oral Daily Lucrezia Starch, MD   20 mg at 01/12/21 1036   feeding supplement (GLUCERNA SHAKE) (GLUCERNA SHAKE) liquid 237 mL  237 mL Oral BID BM Lucrezia Starch, MD       HYDROcodone-acetaminophen (NORCO/VICODIN) 5-325 MG per tablet 1-2 tablet  1-2 tablet Oral Q6H PRN Dorothe Pea, RPH   2 tablet at 01/11/21  2145   insulin aspart (novoLOG) injection 3 Units  3 Units Subcutaneous TID AC Lucrezia Starch, MD   3 Units at 01/12/21 1117   melatonin tablet 2.5 mg  2.5 mg Oral QHS PRN Lucrezia Starch, MD       methocarbamol (ROBAXIN) tablet 500 mg  500 mg Oral Q6H PRN Lucrezia Starch, MD       ondansetron [facility]) tablet 4 mg  4 mg Oral Q6H PRN Lucrezia Starch, MD       pantoprazole (PROTONIX) EC tablet 40 mg  40 mg Oral Daily Lucrezia Starch, MD   40 mg at 01/12/21 1037   polyethylene glycol (MIRALAX / GLYCOLAX) packet 17 g  17 g Oral Daily Dorothe Pea, RPH   17 g at 01/12/21 1037   protein supplement (ENSURE MAX) liquid  11 oz Oral QHS Lucrezia Starch, MD       senna-docusate (Senokot-S) tablet 1 tablet  1 tablet Oral QHS PRN Lucrezia Starch, MD       tamsulosin [facility]) capsule 0.4 mg  0.4 mg Oral Daily Lucrezia Starch, MD   0.4 mg at  01/12/21 1037   Current Outpatient Medications  Medication Sig Dispense Refill   DULoxetine (CYMBALTA) 20 MG capsule Take 20 mg by mouth daily.     insulin regular (NOVOLIN R) 100 units/mL injection Inject 3 Units into the skin 3 (three) times daily before meals.     acetaminophen (TYLENOL) 325 MG tablet Take 650 mg by mouth every 6 (six) hours as needed.     apixaban (ELIQUIS) 5 MG TABS tablet Take 5 mg by mouth 2 (two) times daily.     atorvastatin (LIPITOR) 20 MG tablet Take 20 mg by mouth at bedtime.     bisacodyl (DULCOLAX) 10 MG suppository Place 1 suppository (10 mg total) rectally daily as needed for moderate constipation. 12 suppository 0   docusate sodium (COLACE) 100 MG capsule Take 1 capsule (100 mg total) by mouth 2 (two) times daily. 10 capsule 0   Ensure Max Protein (ENSURE MAX PROTEIN) LIQD Take 330 mLs (11 oz total) by mouth at bedtime.     feeding supplement, GLUCERNA SHAKE, (GLUCERNA SHAKE) LIQD Take 237 mLs by mouth 2 (two) times daily between meals.  0   HYDROcodone-acetaminophen (NORCO/VICODIN) 5-325 MG tablet Take 1-2 tablets by mouth every 6 (six) hours as needed for moderate pain. 30 tablet 0   Melatonin 3 MG CAPS Take 3 mg by mouth at bedtime as needed.     metFORMIN (GLUCOPHAGE) 500 MG tablet Take 1 tablet (500 mg total) by mouth daily with breakfast. 30 tablet 11   methocarbamol (ROBAXIN) 500 MG tablet Take 1 tablet (500 mg total) by mouth every 6 (six) hours as needed for muscle spasms. 30 tablet 1   Multiple Vitamins-Minerals (MULTIVITAMIN ADULT, MINERALS,) TABS Take 1 tablet by mouth daily. (Patient not taking: No sig reported)     omeprazole (PRILOSEC) 20 MG capsule Take 20 mg by mouth daily.     ondansetron (ZOFRAN) 4 MG tablet Take 1 tablet (4 mg total) by mouth every 6 (six) hours as needed for nausea. 20 tablet 0   polyethylene glycol powder (GLYCOLAX/MIRALAX) 17 GM/SCOOP powder Take 17 g by mouth daily. MIX 17GM (1 CAPFUL) BY MOUTH ONCE EVERY DAY MIX 17 GRAMS  (USE BOTTLE CAP) IN LIQUID OF CHOICE AS DIRECTED TO PREVENT CONSTIPATION     senna-docusate (SENOKOT-S) 8.6-50 MG tablet Take 1 tablet by  mouth at bedtime as needed for mild constipation.     tamsulosin (FLOMAX) 0.4 MG CAPS capsule Take 0.4 mg by mouth daily.       Discharge Medications: Please see discharge summary for a list of discharge medications.  Relevant Imaging Results:  Relevant Lab Results:   Additional Information TU#[id number]  Anselm Pancoast, RN

## 2021-01-12 NOTE — TOC Initial Note (Addendum)
Transition of Care [facility]) - Initial/Assessment Note    Patient Details  Name: Corey Gonzalez MRN: [id number] Date of Birth: 1940-03-01  Transition of Care [facility]) CM/SW Contact:    Anselm Pancoast, RN Phone Number: 01/12/2021, 11:36 AM  Clinical Narrative:                 Patient known from previous admission to have no free SNF days available and unable to afford copay days at [facility]. Received BSC and wheelchair at last admission and was sent home with hhc. [facility] hours provided by [place].   Previous note reports: recent admission [facility] where he used 27 bed days and owes [facility] 1167.00, he will be in copay days and has to pay $2850.50 upfront to be able to return to SNF, He was set up with [facility] [place] from [facility], [facility] has had ithe past but they are not the current [facility] agency, He is set up with [facility] for [facility] at this time  Outreach to [employer] @ [facility] requesting any additional assistance.         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

## 2021-01-12 NOTE — ED Notes (Signed)
Dietary called for meal tray at this time

## 2021-01-12 NOTE — ED Notes (Signed)
Patient sleeping in stretcher at this time. Respirations even and unlabored. NAD noted at this time. Lunch tray placed at bedside.

## 2021-01-12 NOTE — ED Notes (Signed)
Graham crackers, PB, and water given. No other needs at this time.

## 2021-01-12 NOTE — TOC Progression Note (Signed)
Transition of Care [facility]) - Progression Note    Patient Details  Name: [name] MRN: [id number] Date of Birth: 05-12-1940  Transition of Care [facility]) CM/SW [place], RN Phone Number: 01/12/2021, 4:27 PM  Clinical Narrative:    Phoebe Perch and PASSR completed and sent out for bed offer. Outreach to [place] [facility] to follow up on patient returning to [facility] and paying the balance and copays on admission.         Expected Discharge Plan and Services                                                 Social Determinants of Health (SDOH) Interventions    Readmission Risk Interventions No flowsheet data found.

## 2021-01-13 LAB — CBG MONITORING, ED: Glucose-Capillary: 129 mg/dL — ABNORMAL HIGH (ref 70–99)

## 2021-01-13 NOTE — ED Notes (Signed)
VOL / pending TOC placement 

## 2021-01-13 NOTE — ED Notes (Signed)
Food tray with water was given.

## 2021-01-13 NOTE — ED Notes (Signed)
Patient's colostomy bag changed.

## 2021-01-13 NOTE — ED Notes (Signed)
TOC

## 2021-01-13 NOTE — Progress Notes (Signed)
Physical Therapy Treatment Patient Details Name: Corey Gonzalez MRN: [id number] DOB: 10-Apr-1940 Today's Date: 01/13/2021   History of Present Illness Pt is a 80 y.o. male presenting to hospital 11/27 c/o L knee and thigh pain s/p fall 11/23.  Of note, pt s/p L ORIF distal femur fx/periprosthetic fx 12/31/20; (+) COVID-19 11/15; colon resection in September with SNF stay.  CT of the left knee shows the possibly increased displacement of the oblique fracture of distal femoral diaphysis without any new fractures or dislocations noted.  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    PT Comments    Pt resting in bed upon PT arrival; agreeable to PT session.  Tolerated LE ex's in bed fairly well with assist for L LE as needed.  SBA with bed mobility; SBA with lateral scooting along bed L/R; unable to stand with 1 assist from bed at regular height but able to stand from elevated bed with mod assist--x2 trials (up to RW); and able to "hop" forwards/backwards with min to mod assist for balance and use of RW (3 "hops" 1st trial and 1 "hop" second trial).  Pt able to maintain [place] L LE during sessions activities.  Pt noted to fatigue with activities but appeared very motivated to participate in therapy.  Will continue to focus on strengthening and progressive functional mobility during hospitalization.    Recommendations for follow up therapy are one component of a multi-disciplinary discharge planning process, led by the attending physician.  Recommendations may be updated based on patient status, additional functional criteria and insurance authorization.  Follow Up Recommendations  Skilled nursing-short term rehab (<3 hours/day)     Assistance Recommended at Discharge Frequent or constant Supervision/Assistance  Equipment Recommendations  Rolling walker (2 wheels);BSC/3in1;Wheelchair (measurements  PT);Wheelchair cushion (measurements PT)    Recommendations for Other Services OT consult     Precautions / Restrictions Precautions Precautions: Fall Precaution Comments: LLQ colostomy; Limit L knee range of motion to 0-90 Restrictions Weight Bearing Restrictions: Yes LLE Weight Bearing: Non weight bearing Other Position/Activity Restrictions: Per Dr. Thompson Caul note 11/27 the on call orthopedist (Dr. Roland Rack) recommending NWB L LE.     Mobility  Bed Mobility Overal bed mobility: Needs Assistance Bed Mobility: Supine to Sit;Sit to Supine     Supine to sit: Supervision;HOB elevated (x2 trials) Sit to supine: Supervision;HOB elevated (x2 trials)   General bed mobility comments: increased effort and time for pt to perform on own    Transfers Overall transfer level: Needs assistance Equipment used: Rolling walker (2 wheels);None Transfers: Sit to/from Stand;Bed to chair/wheelchair/BSC Sit to Stand: Mod assist;From elevated surface          Lateral/Scoot Transfers: Supervision General transfer comment: SBA with pt lateral scooting along bed L and R (able to maintain NWB'ing L LE); pt unable to stand from bed at regular height with 1 assist but able to stand from elevated bed height x2 trials with mod assist (vc's for UE/LE placement); assist for balance once standing (use of RW)    Ambulation/Gait Ambulation/Gait assistance: Min assist;Mod assist Gait Distance (Feet):  (pt able to "hop" forward/backwards 3 hops first trial and 1 hop 2nd trial) Assistive device: Rolling walker (2 wheels)   Gait velocity: decreased     General Gait Details: pt unsteady requiring assist for balance (d/t NWB'ing L LE); vc's for technique; decreased R LE foot clearance   Stairs  Wheelchair Mobility    Modified Rankin (Stroke Patients Only)       Balance Overall balance assessment: Needs assistance Sitting-balance support: No upper extremity supported;Feet  supported Sitting balance-Leahy Scale: Good Sitting balance - Comments: steady sitting reaching within BOS   Standing balance support: Bilateral upper extremity supported;Reliant on assistive device for balance;During functional activity Standing balance-Leahy Scale: Poor Standing balance comment: pt unsteady in standing (with NWB'ing status) requiring assist for balance (even with RW use)                            Cognition Arousal/Alertness: Awake/alert Behavior During Therapy: WFL for tasks assessed/performed Overall Cognitive Status: Within Functional Limits for tasks assessed                                 General Comments: A&O x4        Exercises Total Joint Exercises Ankle Circles/Pumps: AROM;Strengthening;Both;10 reps;Supine Quad Sets: AROM;Strengthening;Both;10 reps;Supine Short Arc Quad: AROM;Right;AAROM;Left;Strengthening;10 reps;Supine Heel Slides: AROM;Strengthening;Both;10 reps;Supine (limited ROM R knee flexion per pt's tolerance) Hip ABduction/ADduction: AROM;Right;AAROM;Left;Strengthening;10 reps;Supine    General Comments  Pt agreeable to PT session.      Pertinent Vitals/Pain Pain Assessment: 0-10 Pain Score: 4  Pain Location: L hip and L knee Pain Descriptors / Indicators: Aching;Sore;Tender Pain Intervention(s): Limited activity within patient's tolerance;Monitored during session;Repositioned;Patient requesting pain meds-RN notified;RN gave pain meds during session Vitals (HR and O2 on room air) stable and WFL throughout treatment session.    Home Living                          Prior Function            PT Goals (current goals can now be found in the care plan section) Acute Rehab PT Goals Patient Stated Goal: to improve mobility PT Goal Formulation: With patient Time For Goal Achievement: 01/29/21 Potential to Achieve Goals: Good Progress towards PT goals: Progressing toward goals    Frequency     7X/week      PT Plan Current plan remains appropriate    Co-evaluation              AM-PAC PT "6 Clicks" Mobility   Outcome Measure  Help needed turning from your back to your side while in a flat bed without using bedrails?: A Little Help needed moving from lying on your back to sitting on the side of a flat bed without using bedrails?: A Little Help needed moving to and from a bed to a chair (including a wheelchair)?: A Lot Help needed standing up from a chair using your arms (e.g., wheelchair or bedside chair)?: A Lot Help needed to walk in hospital room?: A Lot Help needed climbing 3-5 steps with a railing? : Total 6 Click Score: 13    End of Session Equipment Utilized During Treatment: Gait belt Activity Tolerance: Patient limited by fatigue Patient left: in bed;with bed alarm set;Other (comment) (no call light available but pt reporting being able to call out for any needs) Nurse Communication: Mobility status;Precautions;Weight bearing status;Patient requests pain meds PT Visit Diagnosis: Unsteadiness on feet (R26.81);Other abnormalities of gait and mobility (R26.89);Repeated falls (R29.6);Muscle weakness (generalized) (M62.81);History of falling (Z91.81);Difficulty in walking, not elsewhere classified (R26.2);Pain Pain - Right/Left: Left Pain - part of body: Knee     Time: 9937-1696 PT Time Calculation (min) (  ACUTE ONLY): 30 min  Charges:  $Therapeutic Exercise: 8-22 mins $Therapeutic Activity: 8-22 mins                    Leitha Bleak, PT 01/13/21, 11:49 AM

## 2021-01-13 NOTE — ED Notes (Signed)
VOLUNTARY/AWAITING TOC DISPOSITION

## 2021-01-13 NOTE — TOC Progression Note (Signed)
Transition of Care [facility]) - Progression Note    Patient Details  Name: Corey Gonzalez MRN: [id number] Date of Birth: [birth year]  Transition [facility] Care [facility]) CM/SW [place], RN Phone Number: 01/13/2021, 10:15 AM  Clinical Narrative:    Received call from Otila Kluver @ [facility]-unable to offer bed at this time.         Expected Discharge Plan and Services                                                 Social Determinants of Health (SDOH) Interventions    Readmission Risk Interventions No flowsheet data found.

## 2021-01-13 NOTE — Evaluation (Signed)
Occupational Therapy Evaluation Patient Details Name: Corey Gonzalez MRN: [id number] DOB: 05-22-1940 Today's Date: 01/13/2021   History of Present Illness Pt is a 80 y.o. male presenting to hospital 11/27 c/o L knee and thigh pain s/p fall 11/23.  Of note, pt s/p L ORIF distal femur fx/periprosthetic fx 12/31/20; (+) COVID-19 11/15; colon resection in September with SNF stay.  CT of the left knee shows the possibly increased displacement of the oblique fracture of distal femoral diaphysis without any new fractures or dislocations noted.  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   Clinical Impression   Pt seen for OT evaluation this date. Prior to this admission, pt was completing ADLs and functional mobility MOD-I (with [phone] and adaptive equipment), living in a 1-level home with significant other. During September SNF admission, pt was working on IADLs (e.g., laundry and meal prep) d/t decreased activity tolerance. This date, pt provided with handout on adaptive equipment following knee surgery; pt verbalized familiarity/previous experience with AD on handout and demonstrated good understanding of reacher and sock aide during session. Pt currently requires SET-UP assist for seated UB ADLs and SET-UP assist for seated LB dressing (to don/doff socks with reacher and sock aide). Pt required MOD A for x2 sit<>stand transfers d/t decreased balance while adhering to NWB precaution. Pt was unable to complete standing UB ADLs this date d/t requiring b/l UE support from RW to maintain static standing balance. Pt only able to take 3 lateral hops this date with RW. Pt currently presents with decreased balance and decreased strength and would benefit from additional skilled OT services to maximize return to PLOF and minimize risk of future falls, injury, caregiver burden, and readmission. Upon discharge, recommend SNF.        Recommendations for follow up therapy are one component of a multi-disciplinary discharge planning process, led by the attending physician.  Recommendations may be updated based on patient status, additional functional criteria and insurance authorization.   Follow Up Recommendations  Skilled nursing-short term rehab (<3 hours/day)    Assistance Recommended at Discharge Frequent or constant Supervision/Assistance  Functional Status Assessment  Patient has had a recent decline in their functional status and demonstrates the ability to make significant improvements in function in a reasonable and predictable amount of time.  Equipment Recommendations  None recommended by OT (pt has all necessary DME/AD)       Precautions / Restrictions Precautions Precautions: Fall Precaution Comments: LLQ colostomy; Limit L knee range of motion to 0-90 Restrictions Weight Bearing Restrictions: Yes LLE Weight Bearing: Non weight bearing Other Position/Activity Restrictions: Per Dr. Thompson Caul note 11/27 the on call orthopedist (Dr. Roland Rack) recommending NWB L LE.      Mobility Bed Mobility Overal bed mobility: Needs Assistance Bed Mobility: Supine to Sit;Sit to Supine     Supine to sit: Supervision;HOB elevated Sit to supine: Supervision        Transfers Overall transfer level: Needs assistance Equipment used: Rolling walker (2 wheels) Transfers: Sit to/from Stand Sit to Stand: Mod assist;From elevated surface           General transfer comment: With bed elevated, pt required MOD A and verbal cues for UE/LE placement for sit<>stand transfer. x2 bouts      Balance Overall balance assessment: Needs assistance Sitting-balance support: No upper extremity supported;Feet supported Sitting balance-Leahy Scale: Good Sitting balance - Comments: steady sitting reaching within BOS  Standing balance support: Bilateral upper extremity supported;Reliant on assistive device for balance;During  functional activity Standing balance-Leahy Scale: Poor Standing balance comment: pt unsteady in standing (while maintainin NWB'ing status) requiring MIN-MOD assist for balance                           ADL either performed or assessed with clinical judgement   ADL Overall ADL's : Needs assistance/impaired                                       General ADL Comments: Requires SET-UP assist for seated UB ADLs and SET-UP assist for seated LB dressing (to don/doff socks with reacher and sock aide). Pt unable to complete standing UB ADLs d/t requiring b/l UE to maintain static standing balance     Vision Ability to See in Adequate Light: 0 Adequate Patient Visual Report: No change from baseline              Pertinent Vitals/Pain Pain Assessment: 0-10 Pain Score: 4  Pain Location: L knee Pain Descriptors / Indicators: Aching;Sore;Tender Pain Intervention(s): Limited activity within patient's tolerance;Monitored during session;Patient requesting pain meds-RN notified        Extremity/Trunk Assessment Upper Extremity Assessment Upper Extremity Assessment: Generalized weakness   Lower Extremity Assessment Lower Extremity Assessment: Generalized weakness;Defer to PT evaluation LLE: Unable to fully assess due to pain       Communication Communication Communication: HOH   Cognition Arousal/Alertness: Awake/alert Behavior During Therapy: WFL for tasks assessed/performed Overall Cognitive Status: Within Functional Limits for tasks assessed                                 General Comments: A&Ox4        Exercises Other Exercises Other Exercises: Pt provided handout on adaptive equipment following knee surgery. Pt verbalized familiarity/previous experience with AD on handout and demonstrated good understanding of reacher and sock aide during session        [place] expects to be discharged to:: Private residence Living  Arrangements: Spouse/significant other (pt's girlfriend (h/o stroke 2019)) Available Help at Discharge: Family;Available PRN/intermittently Type of Home: House Home Access: Ramped entrance     Home Layout: One level     Bathroom Shower/Tub: Hospital doctor Toilet: Handicapped height     Home Equipment: Conservation officer, nature (2 wheels);Rollator (4 wheels);BSC/3in1;Grab bars - toilet;Grab bars - tub/shower;Shower seat;Wheelchair - Scientist, physiological: Reacher;Sock aid;Other (Comment) (dressing stick) Additional Comments: Pt's significant other unable to physically assist pt.      Prior Functioning/Environment Prior Level of Function : Independent/Modified Independent             Mobility Comments: Pt was ambulating with 4ww prior to current hospitalization. ADLs Comments: Pt was completing ADLs MOD-I (with 4ww and/or AD) prior to admission. At recent SNF admission, pt was working on IADLs (e.g., laundry and meal prep) d/t decreased activity tolerance        OT Problem List: Decreased strength;Impaired balance (sitting and/or standing);Decreased range of motion;Pain      OT Treatment/Interventions: Self-care/ADL training;Therapeutic exercise;DME and/or AE instruction;Therapeutic activities;Patient/family education;Balance training    OT Goals(Current goals can be found in the care plan section) Acute Rehab OT Goals Patient Stated Goal: to go to rehab to be independent again OT  Goal Formulation: With patient Time For Goal Achievement: 01/27/21 Potential to Achieve Goals: Good ADL Goals Pt Will Perform Lower Body Dressing: with min assist;with adaptive equipment;sit to/from stand Pt Will Transfer to Toilet: with min assist;ambulating;bedside commode Pt Will Perform Tub/Shower Transfer: with min assist;ambulating;shower seat;rolling walker  OT Frequency: Min 2X/week    AM-PAC OT "6 Clicks" Daily Activity     Outcome Measure Help from another  person eating meals?: None Help from another person taking care of personal grooming?: None Help from another person toileting, which includes using toliet, bedpan, or urinal?: A Lot (anticipate pt requiring MOD A for BSC transfers. No assist required for colostomy management and urinal use) Help from another person bathing (including washing, rinsing, drying)?: A Lot Help from another person to put on and taking off regular upper body clothing?: A Little Help from another person to put on and taking off regular lower body clothing?: A Lot 6 Click Score: 17   End of Session Equipment Utilized During Treatment: Gait belt;Rolling walker (2 wheels) Nurse Communication: Mobility status;Patient requests pain meds  Activity Tolerance: Patient tolerated treatment well Patient left: in bed;with call bell/phone within reach;with bed alarm set  OT Visit Diagnosis: Unsteadiness on feet (R26.81);History of falling (Z91.81)                Time: 3838-1840 OT Time Calculation (min): 27 min Charges:  OT General Charges $OT Visit: 1 Visit OT Evaluation $OT Eval Moderate Complexity: 1 Mod OT Treatments $Self Care/Home Management : 8-22 mins  Fredirick Maudlin, OTR/L [place]

## 2021-01-13 NOTE — ED Notes (Signed)
Pt. Alert and oriented, warm and dry, in no distress. Pt. Denies SI, HI, and AVH. Pt. Encouraged to let nursing staff know of any concerns or needs.  ENVIRONMENTAL ASSESSMENT Potentially harmful objects out of patient reach: Yes.   Personal belongings secured: Yes.   Patient dressed in hospital provided attire only: Yes.   Plastic bags out of patient reach: Yes.   Patient care equipment (cords, cables, call bells, lines, and drains) shortened, removed, or accounted for: Yes.   Equipment and supplies removed from bottom of stretcher: Yes.   Potentially toxic materials out of patient reach: Yes.   Sharps container removed or out of patient reach: Yes.

## 2021-01-14 LAB — CBG MONITORING, ED
Glucose-Capillary: 119 mg/dL — ABNORMAL HIGH (ref 70–99)
Glucose-Capillary: 225 mg/dL — ABNORMAL HIGH (ref 70–99)
Glucose-Capillary: 99 mg/dL (ref 70–99)
Glucose-Capillary: 184 mg/dL — ABNORMAL HIGH (ref 70–99)

## 2021-01-14 MED ORDER — INSULIN ASPART 100 UNIT/ML IJ SOLN
0.0000 [IU] | Freq: Every day | INTRAMUSCULAR | Status: DC
  Administered 2021-01-17: 2 [IU] via SUBCUTANEOUS
  Filled 2021-01-14 (×3): qty 1

## 2021-01-14 NOTE — Progress Notes (Signed)
Physical Therapy Treatment Patient Details Name: Corey Gonzalez MRN: [id number] DOB: November 03, 1940 Today's Date: 01/14/2021   History of Present Illness Pt is a 80 y.o. male presenting to hospital 11/27 c/o L knee and thigh pain s/p fall 11/23.  Of note, pt s/p L ORIF distal femur fx/periprosthetic fx 12/31/20; (+) COVID-19 11/15; colon resection in September with SNF stay.  CT of the left knee shows the possibly increased displacement of the oblique fracture of distal femoral diaphysis without any new fractures or dislocations noted.  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    PT Comments    Pt was long sitting in bed upon arriving. He is A and O x 4 and agreeable to session. Pt is aware of NWB restrictions however required reminder of not bending knee past 90 degrees. He was able to exit R side of bed, stand 4 x EOB, and even pivot L/R to>from FOB <> HOB. Chief Strategy Officer issued HEP handout and pt performed. Overall pt is progressing well but will need rehab at DC to improve safety with ADLs. PT will continue to follow and progress as able per current POC.    Recommendations for follow up therapy are one component of a multi-disciplinary discharge planning process, led by the attending physician.  Recommendations may be updated based on patient status, additional functional criteria and insurance authorization.  Follow Up Recommendations  Skilled nursing-short term rehab (<3 hours/day)     Assistance Recommended at Discharge Frequent or constant Supervision/Assistance  Equipment Recommendations  Rolling walker (2 wheels);BSC/3in1;Wheelchair (measurements PT);Wheelchair cushion (measurements PT)       Precautions / Restrictions Precautions Precautions: Fall Precaution Comments: LLQ colostomy; Limit L knee range of motion to 0-90 Restrictions Weight Bearing Restrictions: Yes LLE Weight Bearing:  Touchdown weight bearing     Mobility  Bed Mobility Overal bed mobility: Needs Assistance Bed Mobility: Supine to Sit;Sit to Supine     Supine to sit: Supervision;HOB elevated Sit to supine: Supervision        Transfers Overall transfer level: Needs assistance Equipment used: Rolling walker (2 wheels) Transfers: Sit to/from Stand Sit to Stand: Min assist;From elevated surface           General transfer comment: pt performed ATS 4 x EOB. Unable to "hop" however was able to stand on RLE only and pivot to HOB/FOB    Ambulation/Gait Ambulation/Gait assistance: Min assist Gait Distance (Feet): 3 Feet Assistive device: Rolling walker (2 wheels) Gait Pattern/deviations:  (pivoting on RLE) Gait velocity: decreased     General Gait Details: pt was able to use RW to pivot R/L on RLE. no LOB however limited standing tolerance.     Balance Overall balance assessment: Needs assistance Sitting-balance support: No upper extremity supported;Feet supported Sitting balance-Leahy Scale: Good     Standing balance support: Bilateral upper extremity supported;Reliant on assistive device for balance;During functional activity Standing balance-Leahy Scale: Fair         Cognition Arousal/Alertness: Awake/alert Behavior During Therapy: WFL for tasks assessed/performed Overall Cognitive Status: Within Functional Limits for tasks assessed      General Comments: A&Ox4           General Comments General comments (skin integrity, edema, etc.): Issued HEP handout and pt performed all exercises. reviewed importance of not bending past 90 degrees.      Pertinent Vitals/Pain Pain Assessment: 0-10 Pain Score: 4  Faces Pain Scale:  Hurts a little bit Pain Location: L knee Pain Descriptors / Indicators: Aching;Sore;Tender Pain Intervention(s): Limited activity within patient's tolerance;Monitored during session;Premedicated before session;Repositioned     PT Goals (current goals  can now be found in the care plan section) Acute Rehab PT Goals Patient Stated Goal: none stated Progress towards PT goals: Progressing toward goals    Frequency    7X/week      PT Plan Current plan remains appropriate       AM-PAC PT "6 Clicks" Mobility   Outcome Measure  Help needed turning from your back to your side while in a flat bed without using bedrails?: A Little Help needed moving from lying on your back to sitting on the side of a flat bed without using bedrails?: A Little Help needed moving to and from a bed to a chair (including a wheelchair)?: A Lot Help needed standing up from a chair using your arms (e.g., wheelchair or bedside chair)?: A Lot Help needed to walk in hospital room?: A Lot Help needed climbing 3-5 steps with a railing? : Total 6 Click Score: 13    End of Session Equipment Utilized During Treatment: Gait belt Activity Tolerance: Patient limited by fatigue Patient left: in bed;with bed alarm set;with call bell/phone within reach Nurse Communication: Mobility status;Precautions;Weight bearing status;Patient requests pain meds PT Visit Diagnosis: Unsteadiness on feet (R26.81);Other abnormalities of gait and mobility (R26.89);Repeated falls (R29.6);Muscle weakness (generalized) (M62.81);History of falling (Z91.81);Difficulty in walking, not elsewhere classified (R26.2);Pain Pain - Right/Left: Left Pain - part of body: Knee     Time: 4585-9292 PT Time Calculation (min) (ACUTE ONLY): 21 min  Charges:  $Therapeutic Activity: 8-22 mins                    Julaine Fusi PTA 01/14/21, 12:22 PM

## 2021-01-14 NOTE — ED Notes (Signed)
Pt provided breakfast tray.

## 2021-01-14 NOTE — ED Provider Notes (Signed)
-----------------------------------------   6:24 AM on 01/14/2021 -----------------------------------------   Blood pressure 93/77, pulse 73, temperature 98.7 F (37.1 C), temperature source Oral, resp. rate 18, height 5\' 7"  (1.702 m), weight 58.5 kg, SpO2 99 %.  The patient is calm and cooperative at this time.  There have been no acute events since the last update.  Awaiting disposition plan from social work team.   Paulette Blanch, MD 01/14/21 [phone]

## 2021-01-15 LAB — CBG MONITORING, ED
Glucose-Capillary: 109 mg/dL — ABNORMAL HIGH (ref 70–99)
Glucose-Capillary: 197 mg/dL — ABNORMAL HIGH (ref 70–99)
Glucose-Capillary: 130 mg/dL — ABNORMAL HIGH (ref 70–99)

## 2021-01-15 MED ORDER — INSULIN ASPART 100 UNIT/ML IJ SOLN
0.0000 [IU] | Freq: Three times a day (TID) | INTRAMUSCULAR | Status: DC
  Administered 2021-01-15 – 2021-01-16 (×2): 3 [IU] via SUBCUTANEOUS
  Administered 2021-01-17 (×2): 2 [IU] via SUBCUTANEOUS
  Administered 2021-01-18: 14:00:00 3 [IU] via SUBCUTANEOUS
  Administered 2021-01-19: 5 [IU] via SUBCUTANEOUS
  Administered 2021-01-20: 3 [IU] via SUBCUTANEOUS
  Administered 2021-01-21 (×2): 2 [IU] via SUBCUTANEOUS
  Administered 2021-01-22: 3 [IU] via SUBCUTANEOUS
  Administered 2021-01-22 – 2021-01-23 (×2): 2 [IU] via SUBCUTANEOUS
  Administered 2021-01-23: 5 [IU] via SUBCUTANEOUS
  Administered 2021-01-24 – 2021-01-25 (×2): 3 [IU] via SUBCUTANEOUS
  Administered 2021-01-26: 2 [IU] via SUBCUTANEOUS
  Administered 2021-01-26: 3 [IU] via SUBCUTANEOUS
  Administered 2021-01-26: 2 [IU] via SUBCUTANEOUS
  Administered 2021-01-27 – 2021-01-28 (×2): 3 [IU] via SUBCUTANEOUS
  Administered 2021-01-28: 12:00:00 2 [IU] via SUBCUTANEOUS
  Filled 2021-01-15 (×21): qty 1

## 2021-01-15 MED ORDER — METFORMIN HCL 500 MG PO TABS
500.0000 mg | ORAL_TABLET | Freq: Every day | ORAL | Status: DC
  Administered 2021-01-15 – 2021-01-28 (×14): 500 mg via ORAL
  Filled 2021-01-15 (×16): qty 1

## 2021-01-15 NOTE — Progress Notes (Signed)
Inpatient Diabetes Program Recommendations  AACE/ADA: New Consensus Statement on Inpatient Glycemic Control   Target Ranges:  Prepandial:   less than 140 mg/dL      Peak postprandial:   less than 180 mg/dL (1-2 hours)      Critically ill patients:  140 - 180 mg/dL    Latest Reference Range & Units 01/14/21 07:36 01/14/21 13:10 01/14/21 17:33 01/14/21 22:33  Glucose-Capillary 70 - 99 mg/dL 119 (H) 225 (H)  Novolog 3 units 99 184 (H)  (H): Data is abnormally high  Review of Glycemic Control  Diabetes history: DM2 Outpatient Diabetes medications: Metformin 500 mg QAM Current orders for Inpatient glycemic control: Novolog 0-5 units QHS, Novolog 3 units TID with meals  Inpatient Diabetes Program Recommendations:    DM medications: Please consider ordering Novolog 0-9 units TID with meals for correction, discontinuing Novolog 3 units TID meal coverage, and ordering Metformin 500 mg daily.  Diet: Please consider discontinuing Regular diet and ordering Carb Modified diet if appropriate for patient.  NOTE: Noted consult for diabetes coordinator for recommendations while patient is boarding in ED (waiting for SNF placement). Glucose ranged from 99-225 mg/dl and patient only received Novolog 3 units once on 01/14/21.   Thanks, Barnie Alderman, RN, MSN, CDE Diabetes Coordinator Inpatient Diabetes Program [phone] (Team Pager from 8am to 5pm)

## 2021-01-15 NOTE — Progress Notes (Signed)
Physical Therapy Treatment Patient Details Name: Corey Gonzalez MRN: [id number] DOB: February 06, 1941 Today's Date: 01/15/2021   History of Present Illness Pt is a 80 y.o. male presenting to hospital 11/27 c/o L knee and thigh pain s/p fall 11/23.  Of note, pt s/p L ORIF distal femur fx/periprosthetic fx 12/31/20; (+) COVID-19 11/15; colon resection in September with SNF stay.  CT of the left knee shows the possibly increased displacement of the oblique fracture of distal femoral diaphysis without any new fractures or dislocations noted.  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    PT Comments    Pt was supine in bed upon arriving. He agrees to session and is cooperative throughout. Was easily able to exit R side of bed, stand  4 x to RW and even progress to ambulating a few ft fwd/bwd. Pt does fatigue quickly however overall tolerated well. Once returned to bed, pt performed HEP with little assistance. Overall pt is progressing, however still will greatly benefit from SNF at DC to address deficits while maximizing independence with ADLs.    Recommendations for follow up therapy are one component of a multi-disciplinary discharge planning process, led by the attending physician.  Recommendations may be updated based on patient status, additional functional criteria and insurance authorization.  Follow Up Recommendations  Skilled nursing-short term rehab (<3 hours/day)     Assistance Recommended at Discharge Frequent or constant Supervision/Assistance  Equipment Recommendations  Other (comment) (defer to next level)       Precautions / Restrictions Precautions Precautions: Fall Precaution Comments: LLQ colostomy; Limit L knee range of motion to 0-90 Restrictions Weight Bearing Restrictions: Yes LLE Weight Bearing: Non weight bearing     Mobility  Bed Mobility Overal bed mobility: Needs  Assistance Bed Mobility: Supine to Sit;Sit to Supine     Supine to sit: Supervision;HOB elevated Sit to supine: Supervision        Transfers Overall transfer level: Needs assistance Equipment used: Rolling walker (2 wheels) Transfers: Sit to/from Stand Sit to Stand: Min assist;From elevated surface;Mod assist           General transfer comment: Min A from elevated bed height and mod from lowest bed height. Vcs for handplacement, fwd wt shift, and overall improved technique. performed STS 4 x throughout session.    Ambulation/Gait Ambulation/Gait assistance: Min assist Gait Distance (Feet): 5 Feet Assistive device: Rolling walker (2 wheels) Gait Pattern/deviations:  (" hop to") Gait velocity: decreased     General Gait Details: Pt was able to "hop" R/L/ fwd/bck ~ 5 ft however quickly fatigues and requires seated rest.    Balance Overall balance assessment: Needs assistance Sitting-balance support: No upper extremity supported;Feet supported Sitting balance-Leahy Scale: Good     Standing balance support: Bilateral upper extremity supported;Reliant on assistive device for balance;During functional activity Standing balance-Leahy Scale: Fair      Cognition Arousal/Alertness: Awake/alert Behavior During Therapy: WFL for tasks assessed/performed Overall Cognitive Status: Within Functional Limits for tasks assessed      General Comments: A&Ox4        Exercises Total Joint Exercises Ankle Circles/Pumps: AROM;Strengthening;Both;10 reps;Supine Quad Sets: AROM;Strengthening;Both;10 reps;Supine Short Arc Quad: AROM;Right;AAROM;Left;Strengthening;10 reps;Supine Heel Slides: AROM;Strengthening;Both;10 reps;Supine (less than 90 degrees) Hip ABduction/ADduction: AROM;Right;AAROM;Left;Strengthening;10 reps;Supine General Exercises - Lower Extremity Ankle Circles/Pumps: AROM;10 reps Quad Sets: AROM;10 reps Gluteal Sets: AROM;10 reps        Pertinent Vitals/Pain Pain  Assessment: No/denies pain Pain Score: 0-No pain Pain Location: L knee Pain Descriptors / Indicators: Aching;Sore;Tender Pain Intervention(s): Limited activity within patient's tolerance;Monitored during session;Premedicated before session     PT Goals (current goals can now be found in the care plan section) Acute Rehab PT Goals Patient Stated Goal: rehab then home Progress towards PT goals: Progressing toward goals    Frequency    7X/week      PT Plan Current plan remains appropriate       AM-PAC PT "6 Clicks" Mobility   Outcome Measure  Help needed turning from your back to your side while in a flat bed without using bedrails?: A Little Help needed moving from lying on your back to sitting on the side of a flat bed without using bedrails?: A Little Help needed moving to and from a bed to a chair (including a wheelchair)?: A Lot Help needed standing up from a chair using your arms (e.g., wheelchair or bedside chair)?: A Lot Help needed to walk in hospital room?: A Lot Help needed climbing 3-5 steps with a railing? : Total 6 Click Score: 13    End of Session Equipment Utilized During Treatment: Gait belt Activity Tolerance: Patient limited by fatigue Patient left: in bed;with bed alarm set;with call bell/phone within reach Nurse Communication: Mobility status;Precautions;Weight bearing status;Patient requests pain meds PT Visit Diagnosis: Unsteadiness on feet (R26.81);Other abnormalities of gait and mobility (R26.89);Repeated falls (R29.6);Muscle weakness (generalized) (M62.81);History of falling (Z91.81);Difficulty in walking, not elsewhere classified (R26.2);Pain Pain - Right/Left: Left Pain - part of body: Knee     Time: 1440-1506 PT Time Calculation (min) (ACUTE ONLY): 26 min  Charges:  $Therapeutic Exercise: 8-22 mins $Therapeutic Activity: 8-22 mins                     Julaine Fusi PTA 01/15/21, 4:41 PM

## 2021-01-15 NOTE — TOC Progression Note (Signed)
Transition of Care [facility]) - Progression Note    Patient Details  Name: Corey Gonzalez MRN: [id number] Date of Birth: 08-23-1940  Transition of Care [facility]) CM/SW [place], [place] Phone Number: [phone] 01/15/2021, 11:41 AM  Clinical Narrative:     CSW updated patient with bed offer from [facility].  Patient stated he was amenable to this placement and he would need to transfer to long term care, since his significant other's daughter told him she is unable to assist with his care needs, since she needs to care for her mother who had a stroke.  Patient stated after SNF rehab, he is willing to transfer to long term care placement, until he gets strong enough to return home.    CSW spoke with [place] w/ Admissions coordinator w/ [facility] SN[facility], who stated she spoke with her leadership and they will not be able to confirm the bed offer until Monday, due to a COVID outbreak. CSW updated Crystal patient was in co-pay Medicare days and he was willing to pay the co-pays upfront. Crystal verbalized understanding and updated this CSW the patient still has 7 Medicare days left. Crystal will contact TOC CSW or RNCM on Monday with final decision on placement.  This is the only SNF placement bed offer the patient has received.       Expected Discharge Plan and Services                                                 Social Determinants of Health (SDOH) Interventions    Readmission Risk Interventions No flowsheet data found.

## 2021-01-15 NOTE — ED Notes (Signed)
Colostomy bagged emptied at this time. Pt denies any further needs

## 2021-01-16 LAB — CBG MONITORING, ED
Glucose-Capillary: 113 mg/dL — ABNORMAL HIGH (ref 70–99)
Glucose-Capillary: 179 mg/dL — ABNORMAL HIGH (ref 70–99)
Glucose-Capillary: 112 mg/dL — ABNORMAL HIGH (ref 70–99)
Glucose-Capillary: 123 mg/dL — ABNORMAL HIGH (ref 70–99)

## 2021-01-16 NOTE — Progress Notes (Signed)
Occupational Therapy Treatment Patient Details Name: Corey Gonzalez MRN: [id number] DOB: 1940/10/01 Today's Date: 01/16/2021   History of present illness Pt is a 80 y.o. male presenting to hospital 11/27 c/o L knee and thigh pain s/p fall 11/23.  Of note, pt s/p L ORIF distal femur fx/periprosthetic fx 12/31/20; (+) COVID-19 11/15; colon resection in September with SNF stay.  CT of the left knee shows the possibly increased displacement of the oblique fracture of distal femoral diaphysis without any new fractures or dislocations noted.  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   OT comments  Corey Gonzalez was seen for OT treatment on this date. Upon arrival to room pt reclined in bed, agreeable to tx. Hep reviewed with good return demonstration of exercises. SUPERVISION seated grooming and managing colostomy bag, limited tolerance 2/2 pain. MAX A for LB access with ROM limitations. RN notified pt reporting 10/10 pain. Pt making good progress toward goals. Pt continues to benefit from skilled OT services to maximize return to PLOF and minimize risk of future falls, injury, caregiver burden, and readmission. Will continue to follow POC. Discharge recommendation remains appropriate.     Recommendations for follow up therapy are one component of a multi-disciplinary discharge planning process, led by the attending physician.  Recommendations may be updated based on patient status, additional functional criteria and insurance authorization.    Follow Up Recommendations  Skilled nursing-short term rehab (<3 hours/day)    Assistance Recommended at Discharge Frequent or constant Supervision/Assistance  Equipment Recommendations  None recommended by OT    Recommendations for Other Services      Precautions / Restrictions Precautions Precautions: Fall Precaution Comments: LLQ colostomy; Limit L knee range of  motion to 0-90 Restrictions Weight Bearing Restrictions: Yes LLE Weight Bearing: Non weight bearing       Mobility Bed Mobility Overal bed mobility: Needs Assistance Bed Mobility: Supine to Sit;Sit to Supine     Supine to sit: Supervision;HOB elevated Sit to supine: Min assist   General bed mobility comments: assist for LLE mgmt    Transfers Overall transfer level: Needs assistance Equipment used: Rolling walker (2 wheels) Transfers: Sit to/from Stand Sit to Stand: Min assist;From elevated surface           General transfer comment: pt deferred 2/2 pain     Balance Overall balance assessment: Needs assistance Sitting-balance support: No upper extremity supported;Feet supported Sitting balance-Leahy Scale: Good     Standing balance support: Bilateral upper extremity supported;Reliant on assistive device for balance;During functional activity Standing balance-Leahy Scale: Fair                             ADL either performed or assessed with clinical judgement   ADL Overall ADL's : Needs assistance/impaired                                       General ADL Comments: SUPERVISION seated grooming and managing colostomy bag. MAX A for LB access with ROM limitations.      Cognition Arousal/Alertness: Awake/alert Behavior During Therapy: WFL for tasks assessed/performed Overall Cognitive Status: Within Functional Limits for tasks assessed  General Comments: A&Ox4          Exercises Exercises: Other exercises Other Exercises Other Exercises: Pt educated re: OT role, DME recs, d/c recs, HEP Other Exercises: colostomy, sup<>sit, sitting balance/tolerance, bed level SAQ           Pertinent Vitals/ Pain       Pain Assessment: 0-10 Pain Score: 10-Worst pain ever Faces Pain Scale: Hurts whole lot Pain Location: L knee Pain Descriptors / Indicators: Aching;Sore;Tender Pain  Intervention(s): Limited activity within patient's tolerance;Patient requesting pain meds-RN notified         Frequency  Min 2X/week        Progress Toward Goals  OT Goals(current goals can now be found in the care plan section)  Progress towards OT goals: Progressing toward goals  Acute Rehab OT Goals Patient Stated Goal: to get pain med OT Goal Formulation: With patient Time For Goal Achievement: 01/27/21 Potential to Achieve Goals: Good ADL Goals Pt Will Perform Lower Body Dressing: with min assist;with adaptive equipment;sit to/from stand Pt Will Transfer to Toilet: with min assist;ambulating;bedside commode Pt Will Perform Tub/Shower Transfer: with min assist;ambulating;shower seat;rolling walker  Plan Discharge plan remains appropriate;Frequency remains appropriate    Co-evaluation                 AM-PAC OT "6 Clicks" Daily Activity     Outcome Measure   Help from another person eating meals?: None Help from another person taking care of personal grooming?: A Lot Help from another person toileting, which includes using toliet, bedpan, or urinal?: A Little Help from another person bathing (including washing, rinsing, drying)?: A Lot Help from another person to put on and taking off regular upper body clothing?: A Little Help from another person to put on and taking off regular lower body clothing?: A Lot 6 Click Score: 16    End of Session    OT Visit Diagnosis: Unsteadiness on feet (R26.81);History of falling (Z91.81)   Activity Tolerance Patient tolerated treatment well;Patient limited by pain   Patient Left in bed;with call bell/phone within reach   Nurse Communication Patient requests pain meds        Time: 3428-7681 OT Time Calculation (min): 9 min  Charges: OT General Charges $OT Visit: 1 Visit OT Treatments $Self Care/Home Management : 8-22 mins  Dessie Coma, M.S. OTR/L  01/16/21, 4:09 PM  ascom [phone]

## 2021-01-16 NOTE — ED Notes (Signed)
New bag placed on colostomy

## 2021-01-16 NOTE — ED Notes (Signed)
Pt given breakfast tray

## 2021-01-16 NOTE — ED Provider Notes (Signed)
-----------------------------------------   5:42 AM on 01/16/2021 -----------------------------------------   Blood pressure 120/72, pulse 87, temperature 98 F (36.7 C), temperature source Oral, resp. rate 18, height 1.702 m (5\' 7" ), weight 58.5 kg, SpO2 100 %.  The patient is calm and cooperative at this time.  There have been no acute events since the last update.  Awaiting disposition plan from [facility] team.   Hinda Kehr, MD 01/16/21 [phone]

## 2021-01-16 NOTE — Progress Notes (Signed)
Physical Therapy Treatment Patient Details Name: Corey Gonzalez MRN: [id number] DOB: [birth year] Today's Date: 01/16/2021   History of Present Illness Pt is a 80 y.o. male presenting to hospital 11/27 c/o L knee and thigh pain s/p fall 11/23.  Of note, pt s/p L ORIF distal femur fx/periprosthetic fx 12/31/20; (+) COVID-19 11/15; colon resection in September with SNF stay.  CT of the left knee shows the possibly increased displacement of the oblique fracture of distal femoral diaphysis without any new fractures or dislocations noted.  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    PT Comments    Pt was long sitting in bed upon arriving. He agrees to session and is cooperative throughout however pt does endorse increased pain versus previous date. " I'll still try but its way worse today." He was able to exit R side of bed, stand to RW, and hop several ft fwd/bwd. Unwilling to sit OOB in chair but was willing to perform there ex in bed. See exercises listed below. Overall pt continues to improve but will require SNF at DC to improve safety with ADLs.     Recommendations for follow up therapy are one component of a multi-disciplinary discharge planning process, led by the attending physician.  Recommendations may be updated based on patient status, additional functional criteria and insurance authorization.  Follow Up Recommendations  Skilled nursing-short term rehab (<3 hours/day)     Assistance Recommended at Discharge Frequent or constant Supervision/Assistance  Equipment Recommendations  Other (comment) (defer to next level of care)       Precautions / Restrictions Precautions Precautions: Fall Precaution Comments: LLQ colostomy; Limit L knee range of motion to 0-90 Restrictions Weight Bearing Restrictions: Yes LLE Weight Bearing: Non weight bearing     Mobility  Bed Mobility Overal bed  mobility: Needs Assistance Bed Mobility: Supine to Sit;Sit to Supine     Supine to sit: Supervision;HOB elevated Sit to supine: Min assist   General bed mobility comments: Pt required more assistance to return to bed today due to increased pain    Transfers Overall transfer level: Needs assistance Equipment used: Rolling walker (2 wheels) Transfers: Sit to/from Stand Sit to Stand: Min assist;From elevated surface           General transfer comment: Pt stood 4 x EOB to RW with vcs for hand placement and technique improvements    Ambulation/Gait Ambulation/Gait assistance: Min Web designer (Feet): 3 Feet Assistive device: Rolling walker (2 wheels)   Gait velocity: decreased     General Gait Details: pt was able to hop ~ 3 ft fwd/bwd without LOB    Balance Overall balance assessment: Needs assistance Sitting-balance support: No upper extremity supported;Feet supported Sitting balance-Leahy Scale: Good     Standing balance support: Bilateral upper extremity supported;Reliant on assistive device for balance;During functional activity Standing balance-Leahy Scale: Fair         Cognition Arousal/Alertness: Awake/alert Behavior During Therapy: WFL for tasks assessed/performed Overall Cognitive Status: Within Functional Limits for tasks assessed      General Comments: A&Ox4        Exercises Total Joint Exercises Ankle Circles/Pumps: AROM;Strengthening;Both;10 reps;Supine Quad Sets: AROM;Strengthening;Both;10 reps;Supine Short Arc Quad: AROM;Right;AAROM;Left;Strengthening;10 reps;Supine Heel Slides: AROM;Strengthening;Both;10 reps;Supine (through minimla ROM < 90 degrees throughotu) Hip ABduction/ADduction: AROM;Right;AAROM;Left;Strengthening;10 reps;Supine General Exercises - Lower Extremity Ankle Circles/Pumps: AROM;10 reps Quad Sets: AROM;10 reps Gluteal Sets: AROM;10 reps  Pertinent Vitals/Pain Pain Assessment: 0-10 Pain Score: 8  Faces  Pain Scale: Hurts whole lot Pain Location: L knee Pain Descriptors / Indicators: Aching;Sore;Tender Pain Intervention(s): Limited activity within patient's tolerance;Monitored during session;Premedicated before session;Repositioned     PT Goals (current goals can now be found in the care plan section) Acute Rehab PT Goals Patient Stated Goal: rehab then home Progress towards PT goals: Progressing toward goals    Frequency    7X/week      PT Plan Current plan remains appropriate       AM-PAC PT "6 Clicks" Mobility   Outcome Measure  Help needed turning from your back to your side while in a flat bed without using bedrails?: A Little Help needed moving from lying on your back to sitting on the side of a flat bed without using bedrails?: A Little Help needed moving to and from a bed to a chair (including a wheelchair)?: A Little Help needed standing up from a chair using your arms (e.g., wheelchair or bedside chair)?: A Little Help needed to walk in hospital room?: A Lot Help needed climbing 3-5 steps with a railing? : Total 6 Click Score: 15    End of Session   Activity Tolerance: Patient tolerated treatment well;Patient limited by fatigue Patient left: in bed;with bed alarm set;with call bell/phone within reach Nurse Communication: Mobility status;Precautions;Weight bearing status;Patient requests pain meds PT Visit Diagnosis: Unsteadiness on feet (R26.81);Other abnormalities of gait and mobility (R26.89);Repeated falls (R29.6);Muscle weakness (generalized) (M62.81);History of falling (Z91.81);Difficulty in walking, not elsewhere classified (R26.2);Pain Pain - Right/Left: Left Pain - part of body: Knee     Time: 4540-9811 PT Time Calculation (min) (ACUTE ONLY): 23 min  Charges:  $Therapeutic Exercise: 8-22 mins $Therapeutic Activity: 8-22 mins                     Julaine Fusi PTA 01/16/21, 12:41 PM

## 2021-01-17 LAB — CBG MONITORING, ED
Glucose-Capillary: 108 mg/dL — ABNORMAL HIGH (ref 70–99)
Glucose-Capillary: 147 mg/dL — ABNORMAL HIGH (ref 70–99)
Glucose-Capillary: 126 mg/dL — ABNORMAL HIGH (ref 70–99)
Glucose-Capillary: 207 mg/dL — ABNORMAL HIGH (ref 70–99)

## 2021-01-17 NOTE — ED Notes (Signed)
Pt asked for pain meds

## 2021-01-17 NOTE — Progress Notes (Signed)
Physical Therapy Treatment Patient Details Name: Corey Gonzalez MRN: [id number] DOB: 14-Dec-1940 Today's Date: 01/17/2021   History of Present Illness Pt is a 80 y.o. male presenting to hospital 11/27 c/o L knee and thigh pain s/p fall 11/23.  Of note, pt s/p L ORIF distal femur fx/periprosthetic fx 12/31/20; (+) COVID-19 11/15; colon resection in September with SNF stay.  CT of the left knee shows the possibly increased displacement of the oblique fracture of distal femoral diaphysis without any new fractures or dislocations noted.  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    PT Comments    Pt was long sitting in bed upon arriving. He is A and O x 4. Agreeable to session. He knows he is NWB and does do well maintaining during session. Pt is severely limited by fatigue and pain. Was able to stand and "hop" a few steps on LLE however once fatigued does poor job maintaining NWB. He returned to bed and performed HEP. Pt is progressing however due to NWB status and limited assistance at home at DC, PT recommends DC to SNF.    Recommendations for follow up therapy are one component of a multi-disciplinary discharge planning process, led by the attending physician.  Recommendations may be updated based on patient status, additional functional criteria and insurance authorization.  Follow Up Recommendations  Skilled nursing-short term rehab (<3 hours/day)     Assistance Recommended at Discharge Frequent or constant Supervision/Assistance  Equipment Recommendations  Other (comment) (defer to next level of care. Will need WC/RW/BSC in future)       Precautions / Restrictions Precautions Precautions: Fall Precaution Comments: LLQ colostomy; Limit L knee range of motion to 0-90 Restrictions Weight Bearing Restrictions: Yes LLE Weight Bearing: Non weight bearing     Mobility  Bed Mobility Overal bed  mobility: Needs Assistance Bed Mobility: Supine to Sit;Sit to Supine     Supine to sit: Supervision;HOB elevated Sit to supine: Min guard   General bed mobility comments: Supervision to exit bed and CGA to return to supine.    Transfers Overall transfer level: Needs assistance Equipment used: Rolling walker (2 wheels) Transfers: Sit to/from Stand Sit to Stand: Min assist           General transfer comment: Min assist to stand from slightly elevated bed height with vcs for technique improvements    Ambulation/Gait Ambulation/Gait assistance: Min assist Gait Distance (Feet): 3 Feet Assistive device: Rolling walker (2 wheels) Gait Pattern/deviations:  (" hop to') Gait velocity: decreased     General Gait Details: Pt was able to hop a few ft but fatigues quickly and thern is unable to maintain NWB. Returned to bed and performed ther ex after one trial of standing per pt request     Balance Overall balance assessment: Needs assistance Sitting-balance support: No upper extremity supported;Feet supported Sitting balance-Leahy Scale: Good     Standing balance support: Bilateral upper extremity supported;Reliant on assistive device for balance;During functional activity Standing balance-Leahy Scale: Fair Standing balance comment: static balance is fair however dynamic balance is poor      Cognition Arousal/Alertness: Awake/alert Behavior During Therapy: WFL for tasks assessed/performed Overall Cognitive Status: Within Functional Limits for tasks assessed      General Comments: A&Ox4        Exercises General Exercises - Lower Extremity Ankle Circles/Pumps: AROM;10 reps Quad Sets: AROM;10 reps Gluteal Sets: AROM;10 reps  Heel Slides: AROM;10 reps Straight Leg Raises: AAROM;10 reps        Pertinent Vitals/Pain Pain Assessment: 0-10 Pain Score: 6  Faces Pain Scale: Hurts little more Pain Location: L knee Pain Descriptors / Indicators: Aching;Sore;Tender Pain  Intervention(s): Limited activity within patient's tolerance;Monitored during session;Premedicated before session;Repositioned     PT Goals (current goals can now be found in the care plan section) Acute Rehab PT Goals Patient Stated Goal: rehab then home Progress towards PT goals: Progressing toward goals    Frequency    7X/week      PT Plan Current plan remains appropriate       AM-PAC PT "6 Clicks" Mobility   Outcome Measure  Help needed turning from your back to your side while in a flat bed without using bedrails?: A Little Help needed moving from lying on your back to sitting on the side of a flat bed without using bedrails?: A Little Help needed moving to and from a bed to a chair (including a wheelchair)?: A Little Help needed standing up from a chair using your arms (e.g., wheelchair or bedside chair)?: A Little Help needed to walk in hospital room?: A Lot Help needed climbing 3-5 steps with a railing? : Total 6 Click Score: 15    End of Session   Activity Tolerance: Patient tolerated treatment well;Patient limited by fatigue Patient left: in bed;with bed alarm set;with call bell/phone within reach (pt is unwilling to sit up in recliner even when encouraged too) Nurse Communication: Mobility status;Precautions;Weight bearing status;Patient requests pain meds PT Visit Diagnosis: Unsteadiness on feet (R26.81);Other abnormalities of gait and mobility (R26.89);Repeated falls (R29.6);Muscle weakness (generalized) (M62.81);History of falling (Z91.81);Difficulty in walking, not elsewhere classified (R26.2);Pain Pain - Right/Left: Left Pain - part of body: Knee     Time: 7416-3845 PT Time Calculation (min) (ACUTE ONLY): 20 min  Charges:  $Therapeutic Activity: 8-22 mins                    Julaine Fusi PTA 01/17/21, 9:06 AM

## 2021-01-17 NOTE — ED Notes (Signed)
Discharged wrong pt in Epic, Pt is still physically roomed in ED24.

## 2021-01-17 NOTE — ED Notes (Signed)
Pt alert, resting and watching TV at this time.

## 2021-01-18 LAB — CBG MONITORING, ED
Glucose-Capillary: 157 mg/dL — ABNORMAL HIGH (ref 70–99)
Glucose-Capillary: 98 mg/dL (ref 70–99)

## 2021-01-18 NOTE — ED Provider Notes (Signed)
-----------------------------------------   4:48 AM on 01/18/2021 -----------------------------------------   Blood pressure 131/78, pulse 69, temperature 98.7 F (37.1 C), temperature source Oral, resp. rate 14, height 1.702 m (5\' 7" ), weight 58.5 kg, SpO2 97 %.  The patient is calm and cooperative at this time.  There have been no acute events since the last update.  Awaiting disposition plan from [facility] team.   Hinda Kehr, MD 01/18/21 [phone]

## 2021-01-18 NOTE — ED Notes (Signed)
Pt. Alert and oriented, warm and dry, in no distress. Pt. Encouraged to let nursing staff know of any concerns or needs.

## 2021-01-18 NOTE — Progress Notes (Signed)
Physical Therapy Treatment Patient Details Name: Corey Gonzalez MRN: [id number] DOB: 05-31-1940 Today's Date: 01/18/2021   History of Present Illness Pt is a 80 y.o. male presenting to hospital 11/27 c/o L knee and thigh pain s/p fall 11/23.  Of note, pt s/p L ORIF distal femur fx/periprosthetic fx 12/31/20; (+) COVID-19 11/15; colon resection in September with SNF stay.  CT of the left knee shows the possibly increased displacement of the oblique fracture of distal femoral diaphysis without any new fractures or dislocations noted.  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    PT Comments    Pt asleep but awakens easily.  To/from EOB with supervision.  He is able to stand x 3 during session with good adherance to NWB once standing but does put min WB during transition.  Bed was raised which does improve NWB.  He is able to take several challenging sidesteps with RW to reposition in bed but fatigues quickly.  Pt is steady in sitting.  Discussed WB restrictions and rehab expectations.  Pt voiced regret over not going to rehab when recommended on last admission and the need for a longer recover time and NWB as a result.  Encouragement given.   Recommendations for follow up therapy are one component of a multi-disciplinary discharge planning process, led by the attending physician.  Recommendations may be updated based on patient status, additional functional criteria and insurance authorization.  Follow Up Recommendations  Skilled nursing-short term rehab (<3 hours/day)     Assistance Recommended at Discharge Frequent or constant Supervision/Assistance  Equipment Recommendations  Other (comment) (defer to next level of care. Will need WC/RW/BSC in future)    Recommendations for Other Services       Precautions / Restrictions Precautions Precautions: Fall Precaution Comments: LLQ colostomy; Limit L  knee range of motion to 0-90 Restrictions Weight Bearing Restrictions: Yes LLE Weight Bearing: Non weight bearing Other Position/Activity Restrictions: Per Dr. Thompson Caul note 11/27 the on call orthopedist (Dr. Roland Rack) recommending NWB L LE.     Mobility  Bed Mobility Overal bed mobility: Needs Assistance Bed Mobility: Supine to Sit;Sit to Supine     Supine to sit: Supervision;HOB elevated          Transfers Overall transfer level: Needs assistance Equipment used: Rolling walker (2 wheels) Transfers: Sit to/from Stand Sit to Stand: Min assist                Ambulation/Gait Ambulation/Gait assistance: Min assist Gait Distance (Feet): 3 Feet Assistive device: Rolling walker (2 wheels)   Gait velocity: decreased     General Gait Details: laterally along bed to better position.  fatiuged quickly   Marine scientist Rankin (Stroke Patients Only)       Balance Overall balance assessment: Needs assistance Sitting-balance support: No upper extremity supported;Feet supported Sitting balance-Leahy Scale: Good     Standing balance support: Bilateral upper extremity supported;Reliant on assistive device for balance;During functional activity Standing balance-Leahy Scale: Poor                              Cognition Arousal/Alertness: Awake/alert Behavior During Therapy: WFL for tasks assessed/performed Overall Cognitive Status: Within Functional Limits for tasks assessed  General Comments: A&Ox4        Exercises      General Comments        Pertinent Vitals/Pain Pain Assessment: Faces Faces Pain Scale: Hurts little more Pain Location: L knee/hip Pain Descriptors / Indicators: Aching;Sore;Tender Pain Intervention(s): Limited activity within patient's tolerance;Monitored during session;Repositioned    Home Living                           Prior Function            PT Goals (current goals can now be found in the care plan section) Progress towards PT goals: Progressing toward goals    Frequency    7X/week      PT Plan Current plan remains appropriate    Co-evaluation              AM-PAC PT "6 Clicks" Mobility   Outcome Measure  Help needed turning from your back to your side while in a flat bed without using bedrails?: A Little Help needed moving from lying on your back to sitting on the side of a flat bed without using bedrails?: A Little Help needed moving to and from a bed to a chair (including a wheelchair)?: A Little Help needed standing up from a chair using your arms (e.g., wheelchair or bedside chair)?: A Little Help needed to walk in hospital room?: A Lot Help needed climbing 3-5 steps with a railing? : Total 6 Click Score: 15    End of Session   Activity Tolerance: Patient tolerated treatment well;Patient limited by fatigue Patient left: in bed;with bed alarm set;with call bell/phone within reach (pt is unwilling to sit up in recliner even when encouraged too) Nurse Communication: Mobility status;Precautions;Weight bearing status;Patient requests pain meds PT Visit Diagnosis: Unsteadiness on feet (R26.81);Other abnormalities of gait and mobility (R26.89);Repeated falls (R29.6);Muscle weakness (generalized) (M62.81);History of falling (Z91.81);Difficulty in walking, not elsewhere classified (R26.2);Pain Pain - Right/Left: Left Pain - part of body: Knee     Time: 0737-1062 PT Time Calculation (min) (ACUTE ONLY): 24 min  Charges:  $Gait Training: 8-22 mins $Therapeutic Activity: 8-22 mins                    Chesley Noon, PTA 01/18/21, 10:24 AM

## 2021-01-18 NOTE — ED Notes (Signed)
Brought pt snack per his request. Pt. Alert and oriented, warm and dry, in no distress.  Pt. Encouraged to let nursing staff know of any concerns or needs.

## 2021-01-18 NOTE — ED Notes (Signed)
PT at bedside.

## 2021-01-19 LAB — CBG MONITORING, ED
Glucose-Capillary: 124 mg/dL — ABNORMAL HIGH (ref 70–99)
Glucose-Capillary: 220 mg/dL — ABNORMAL HIGH (ref 70–99)
Glucose-Capillary: 88 mg/dL (ref 70–99)
Glucose-Capillary: 112 mg/dL — ABNORMAL HIGH (ref 70–99)

## 2021-01-19 NOTE — Progress Notes (Signed)
Physical Therapy Treatment Patient Details Name: Corey Gonzalez MRN: [id number] DOB: October 21, 1940 Today's Date: 01/19/2021   History of Present Illness Pt is a 80 y.o. male presenting to hospital 11/27 c/o L knee and thigh pain s/p fall 11/23.  Of note, pt s/p L ORIF distal femur fx/periprosthetic fx 12/31/20; (+) COVID-19 11/15; colon resection in September with SNF stay.  CT of the left knee shows the possibly increased displacement of the oblique fracture of distal femoral diaphysis without any new fractures or dislocations noted.  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    PT Comments    Independent HEP with good recall.  Pt able to get to/from EOB with supervision.  Stood x 1 with mod a x 1 to transition to standing.  Pt c/o itchy skin and requesting a bath.  Stated ED staff had not been able to assist him today due to high census.  Session spent focusing on sitting and supine bed mobility while bathing.  Overall does well with dynamic balance and supine mobility.  Pt appreciative for bathing and applying lotion to back for comfort.   Recommendations for follow up therapy are one component of a multi-disciplinary discharge planning process, led by the attending physician.  Recommendations may be updated based on patient status, additional functional criteria and insurance authorization.  Follow Up Recommendations  Skilled nursing-short term rehab (<3 hours/day)     Assistance Recommended at Discharge Frequent or constant Supervision/Assistance  Equipment Recommendations       Recommendations for Other Services       Precautions / Restrictions Precautions Precautions: Fall Precaution Comments: LLQ colostomy; Limit L knee range of motion to 0-90 Restrictions Weight Bearing Restrictions: Yes LLE Weight Bearing: Non weight bearing Other Position/Activity Restrictions: Per Dr. Thompson Caul note  11/27 the on call orthopedist (Dr. Roland Rack) recommending NWB L LE.     Mobility  Bed Mobility Overal bed mobility: Needs Assistance Bed Mobility: Supine to Sit;Sit to Supine     Supine to sit: Supervision;HOB elevated Sit to supine: Supervision        Transfers Overall transfer level: Needs assistance Equipment used: Rolling walker (2 wheels) Transfers: Sit to/from Stand Sit to Stand: Mod assist           General transfer comment: stood x 1 with good NWB but time limited due to fatigue    Ambulation/Gait                   Stairs             Wheelchair Mobility    Modified Rankin (Stroke Patients Only)       Balance                                            Cognition Arousal/Alertness: Awake/alert Behavior During Therapy: WFL for tasks assessed/performed Overall Cognitive Status: Within Functional Limits for tasks assessed                                          Exercises Other Exercises Other Exercises: focused on bed mobility and sitting balance while incorporating bathing allowing pt to do as much as he could on  his own.    General Comments        Pertinent Vitals/Pain Pain Assessment: Faces Faces Pain Scale: Hurts little more Pain Location: L knee/hip Pain Descriptors / Indicators: Aching;Sore;Tender Pain Intervention(s): Limited activity within patient's tolerance;Monitored during session;Repositioned    Home Living                          Prior Function            PT Goals (current goals can now be found in the care plan section) Progress towards PT goals: Progressing toward goals    Frequency    7X/week      PT Plan Current plan remains appropriate    Co-evaluation              AM-PAC PT "6 Clicks" Mobility   Outcome Measure  Help needed turning from your back to your side while in a flat bed without using bedrails?: A Little Help needed moving from lying  on your back to sitting on the side of a flat bed without using bedrails?: A Little Help needed moving to and from a bed to a chair (including a wheelchair)?: A Little Help needed standing up from a chair using your arms (e.g., wheelchair or bedside chair)?: A Lot Help needed to walk in hospital room?: A Lot Help needed climbing 3-5 steps with a railing? : Total 6 Click Score: 14    End of Session Equipment Utilized During Treatment: Gait belt Activity Tolerance: Patient tolerated treatment well Patient left: in bed;with bed alarm set;with call bell/phone within reach Nurse Communication: Mobility status;Precautions;Weight bearing status;Patient requests pain meds PT Visit Diagnosis: Unsteadiness on feet (R26.81);Other abnormalities of gait and mobility (R26.89);Repeated falls (R29.6);Muscle weakness (generalized) (M62.81);History of falling (Z91.81);Difficulty in walking, not elsewhere classified (R26.2);Pain Pain - Right/Left: Left Pain - part of body: Knee     Time: 0253-0316 PT Time Calculation (min) (ACUTE ONLY): 23 min  Charges:  $Therapeutic Exercise: 8-22 mins $Therapeutic Activity: 8-22 mins                    Chesley Noon, PTA 01/19/21, 3:29 PM

## 2021-01-19 NOTE — Progress Notes (Signed)
Occupational Therapy Treatment Patient Details Name: Corey Gonzalez MRN: [id number] DOB: 08/08/1940 Today's Date: 01/19/2021   History of present illness Pt is a 80 y.o. male presenting to hospital 11/27 c/o L knee and thigh pain s/p fall 11/23.  Of note, pt s/p L ORIF distal femur fx/periprosthetic fx 12/31/20; (+) COVID-19 11/15; colon resection in September with SNF stay.  CT of the left knee shows the possibly increased displacement of the oblique fracture of distal femoral diaphysis without any new fractures or dislocations noted.  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   OT comments  Corey Gonzalez was seen for OT treatment on this date. Upon arrival to room pt reclined in bed, recently completed PT but agreeable to tx. Pt requires MAX A don R sock seated EOB. MOD A + RW sit<>stand - requires R foot block, maintains LLE NWBing. Limited tolerance 2/2 fatigue, pt returns to bed. Pt making good progress toward goals. Pt continues to benefit from skilled OT services to maximize return to PLOF and minimize risk of future falls, injury, caregiver burden, and readmission. Will continue to follow POC. Discharge recommendation remains appropriate.     Recommendations for follow up therapy are one component of a multi-disciplinary discharge planning process, led by the attending physician.  Recommendations may be updated based on patient status, additional functional criteria and insurance authorization.    Follow Up Recommendations  Skilled nursing-short term rehab (<3 hours/day)    Assistance Recommended at Discharge Frequent or constant Supervision/Assistance  Equipment Recommendations  None recommended by OT    Recommendations for Other Services      Precautions / Restrictions Precautions Precautions: Fall Precaution Comments: LLQ colostomy; Limit L knee range of motion to 0-90 Restrictions Weight  Bearing Restrictions: Yes LLE Weight Bearing: Non weight bearing Other Position/Activity Restrictions: Per Dr. Thompson Caul note 11/27 the on call orthopedist (Dr. Roland Rack) recommending NWB L LE.       Mobility Bed Mobility Overal bed mobility: Needs Assistance Bed Mobility: Supine to Sit;Sit to Supine     Supine to sit: Supervision;HOB elevated Sit to supine: Supervision        Transfers Overall transfer level: Needs assistance Equipment used: Rolling walker (2 wheels) Transfers: Sit to/from Stand Sit to Stand: Mod assist           General transfer comment: tolerates ~1 min standing     Balance Overall balance assessment: Needs assistance Sitting-balance support: No upper extremity supported;Feet supported Sitting balance-Leahy Scale: Good     Standing balance support: Bilateral upper extremity supported;Reliant on assistive device for balance;During functional activity Standing balance-Leahy Scale: Poor                             ADL either performed or assessed with clinical judgement   ADL Overall ADL's : Needs assistance/impaired                                       General ADL Comments: MAX A don R sock seated EOB. MOD A + RW for ADL t/f - R foot block, maintains LLE NWBing.      Cognition Arousal/Alertness: Awake/alert Behavior During Therapy: WFL for tasks assessed/performed Overall Cognitive Status: Within Functional Limits for tasks assessed  Exercises Exercises: Other exercises Other Exercises Other Exercises: pt educated re: DME recs, falls prevention, HEP, adpated dressing strategies Other Exercises: LBD, sup<>sit, sit<>stand, sitting/standing balance/tolerance           Pertinent Vitals/ Pain       Pain Assessment: Faces Faces Pain Scale: Hurts little more Pain Location: L knee/hip Pain Descriptors / Indicators: Aching;Sore;Tender Pain Intervention(s):  Limited activity within patient's tolerance;Repositioned         Frequency  Min 2X/week        Progress Toward Goals  OT Goals(current goals can now be found in the care plan section)  Progress towards OT goals: Progressing toward goals  Acute Rehab OT Goals Patient Stated Goal: to levae tmrw OT Goal Formulation: With patient Time For Goal Achievement: 01/27/21 Potential to Achieve Goals: Good ADL Goals Pt Will Perform Lower Body Dressing: with min assist;with adaptive equipment;sit to/from stand Pt Will Transfer to Toilet: with min assist;ambulating;bedside commode Pt Will Perform Tub/Shower Transfer: with min assist;ambulating;shower seat;rolling walker  Plan Discharge plan remains appropriate;Frequency remains appropriate    Co-evaluation                 AM-PAC OT "6 Clicks" Daily Activity     Outcome Measure   Help from another person eating meals?: None Help from another person taking care of personal grooming?: A Lot Help from another person toileting, which includes using toliet, bedpan, or urinal?: A Little Help from another person bathing (including washing, rinsing, drying)?: A Lot Help from another person to put on and taking off regular upper body clothing?: A Little Help from another person to put on and taking off regular lower body clothing?: A Lot 6 Click Score: 16    End of Session Equipment Utilized During Treatment: Gait belt;Rolling walker (2 wheels)  OT Visit Diagnosis: Unsteadiness on feet (R26.81);History of falling (Z91.81)   Activity Tolerance Patient tolerated treatment well;Patient limited by pain   Patient Left in bed;with call bell/phone within reach;with bed alarm set   Nurse Communication          Time: 5830-9407 OT Time Calculation (min): 13 min  Charges: OT General Charges $OT Visit: 1 Visit OT Treatments $Self Care/Home Management : 8-22 mins  Dessie Coma, M.S. OTR/L  01/19/21, 4:10 PM  ascom [phone]

## 2021-01-19 NOTE — TOC Progression Note (Signed)
Transition of Care [facility]) - Progression Note    Patient Details  Name: Corey Gonzalez MRN: [id number] Date of Birth: 1940/02/16  Transition of Care [facility]) CM/SW Contact  Shelbie Hutching, RN Phone Number: 01/19/2021, 11:28 AM  Clinical Narrative:    [name] has a bad COVID outbreak and they are not allowed to admit today, at the soonest it will be tomorrow.  Staffing issues due to staff out with [facility].    Expected Discharge Plan: Skilled Nursing Facility Barriers to Discharge: SNF Pending bed offer  Expected Discharge Plan and Services Expected Discharge Plan: [place]   Discharge Planning Services: CM Consult Post Acute Care Choice: [place]                   DME Arranged: N/A DME Agency: NA       HH Arranged: NA HH Agency: NA         Social Determinants of Health (SDOH) Interventions    Readmission Risk Interventions No flowsheet data found.

## 2021-01-19 NOTE — TOC Progression Note (Signed)
Transition of Care [facility]) - Progression Note    Patient Details  Name: Corey Gonzalez MRN: [id number] Date of Birth: 10-Mar-1940  Transition of Care [facility] CM/SW Contact  Shelbie Hutching, RN Phone Number: 01/19/2021, 9:08 AM  Clinical Narrative:    RNCM reached out to [facility], [place].  She will be talking with her DON this morning in morning meeting at 0930.  They have a COVID outbreak but since patient tested positive earlier in November she is thinking it shouldn't be a problem.    Expected Discharge Plan: Skilled Nursing Facility Barriers to Discharge: SNF Pending bed offer  Expected Discharge Plan and Services Expected Discharge Plan: [place]   Discharge Planning Services: CM Consult Post Acute Care Choice: [place]                   DME Arranged: N/A DME Agency: NA       HH Arranged: NA HH Agency: NA         Social Determinants of Health (SDOH) Interventions    Readmission Risk Interventions No flowsheet data found.

## 2021-01-20 LAB — CBG MONITORING, ED
Glucose-Capillary: 103 mg/dL — ABNORMAL HIGH (ref 70–99)
Glucose-Capillary: 200 mg/dL — ABNORMAL HIGH (ref 70–99)
Glucose-Capillary: 118 mg/dL — ABNORMAL HIGH (ref 70–99)
Glucose-Capillary: 122 mg/dL — ABNORMAL HIGH (ref 70–99)

## 2021-01-20 MED ORDER — ONDANSETRON HCL 4 MG/2ML IJ SOLN
INTRAMUSCULAR | Status: AC
  Filled 2021-01-20: qty 2

## 2021-01-20 MED ORDER — FENTANYL CITRATE PF 50 MCG/ML IJ SOSY
PREFILLED_SYRINGE | INTRAMUSCULAR | Status: AC
  Filled 2021-01-20: qty 1

## 2021-01-20 NOTE — TOC Progression Note (Signed)
Transition of Care [facility]) - Progression Note    Patient Details  Name: Corey Gonzalez MRN: [id number] Date of Birth: [birth year]  Transition of Care [facility]) CM/SW Contact  Shelbie Hutching, RN Phone Number: 01/20/2021, 3:36 PM  Clinical Narrative:    [name] still having staffing issues because of COVID outbreak.  The administrator of the building is hopeful that they can accept admissions in the next few days.     Expected Discharge Plan: Skilled Nursing Facility Barriers to Discharge: SNF Pending bed offer  Expected Discharge Plan and Services Expected Discharge Plan: [place]   Discharge Planning Services: CM Consult Post Acute Care Choice: [place]                   DME Arranged: N/A DME Agency: NA       HH Arranged: NA HH Agency: NA         Social Determinants of Health (SDOH) Interventions    Readmission Risk Interventions No flowsheet data found.

## 2021-01-20 NOTE — ED Notes (Signed)
Pt given a diet sprite, graham crackers, and peanut butter

## 2021-01-20 NOTE — Progress Notes (Signed)
Physical Therapy Treatment Patient Details Name: Corey Gonzalez MRN: [id number] DOB: Jun 26, 1940 Today's Date: 01/20/2021   History of Present Illness Pt is a 80 y.o. male presenting to hospital 11/27 c/o L knee and thigh pain s/p fall 11/23.  Of note, pt s/p L ORIF distal femur fx/periprosthetic fx 12/31/20; (+) COVID-19 11/15; colon resection in September with SNF stay.  CT of the left knee shows the possibly increased displacement of the oblique fracture of distal femoral diaphysis without any new fractures or dislocations noted.  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    PT Comments    Pt was very pleasant and motivated t/o the PT session.  He moved relatively well in the bed but did have pain with most L LE AROM/movement.  He struggled with first 2 standing attempts (poor tolerance/leaning back on bed), but with walker height adjustment, light assist and cues for positioning and set up he did manage to maintain standing at EOB  for >30 seconds on 3rd attempt before needing to sit.  Pt with good mobility but poor tolerance with standing/transitions.    Recommendations for follow up therapy are one component of a multi-disciplinary discharge planning process, led by the attending physician.  Recommendations may be updated based on patient status, additional functional criteria and insurance authorization.  Follow Up Recommendations  Skilled nursing-short term rehab (<3 hours/day)     Assistance Recommended at Discharge Frequent or constant Supervision/Assistance  Equipment Recommendations   (TBD at rehab)    Recommendations for Other Services       Precautions / Restrictions Precautions Precautions: Fall Precaution Comments: LLQ colostomy; Limit L knee range of motion to 0-90 Restrictions LLE Weight Bearing: Non weight bearing     Mobility  Bed Mobility Overal bed mobility: Needs  Assistance Bed Mobility: Supine to Sit;Sit to Supine     Supine to sit: Supervision Sit to supine: Supervision   General bed mobility comments: Pt able to move with relatively ease to/from EOB this date.  Confident with transition to sitting with only light UE use and AROM on L LE.    Transfers Overall transfer level: Needs assistance Equipment used: Rolling walker (2 wheels) Transfers: Sit to/from Stand Sit to Stand: Mod assist           General transfer comment: Pt showed great effort with each attempt.  On first 2 he was only able to tolerate <10 seconds and was unable to really get weight forward over the walker (leaning back of LEs on bed).  After rest break and much cuing he was able to do much better on 3rd attempt, but still tolerated only 30-40 seconds (not leaning back on bed) before needing to sit 2/2 fatigue.    Ambulation/Gait                   Stairs             Wheelchair Mobility    Modified Rankin (Stroke Patients Only)       Balance Overall balance assessment: Needs assistance Sitting-balance support: No upper extremity supported;Feet supported Sitting balance-Leahy Scale: Good     Standing balance support: Bilateral upper extremity supported;Reliant on assistive device for balance;During functional activity Standing balance-Leahy Scale: Poor Standing balance comment: Pt struggled to effectively use UEs/walker to maintain upright, deferring to lean back of legs on the bed  Cognition Arousal/Alertness: Awake/alert Behavior During Therapy: WFL for tasks assessed/performed Overall Cognitive Status: Within Functional Limits for tasks assessed                                          Exercises General Exercises - Lower Extremity Ankle Circles/Pumps: AROM;10 reps (reisted DF) Quad Sets: Strengthening;15 reps Short Arc Quad: AROM;10 reps Heel Slides: AROM;10 reps (very limited  ROM/tolerance) Hip ABduction/ADduction: AROM;10 reps Straight Leg Raises: AROM;5 reps    General Comments        Pertinent Vitals/Pain Pain Assessment: Faces Pain Score: 3  Pain Location: L knee/hip    Home Living                          Prior Function            PT Goals (current goals can now be found in the care plan section) Progress towards PT goals: Progressing toward goals    Frequency    7X/week      PT Plan Current plan remains appropriate    Co-evaluation              AM-PAC PT "6 Clicks" Mobility   Outcome Measure  Help needed turning from your back to your side while in a flat bed without using bedrails?: A Little Help needed moving from lying on your back to sitting on the side of a flat bed without using bedrails?: A Little Help needed moving to and from a bed to a chair (including a wheelchair)?: A Little Help needed standing up from a chair using your arms (e.g., wheelchair or bedside chair)?: A Lot Help needed to walk in hospital room?: Total Help needed climbing 3-5 steps with a railing? : Total 6 Click Score: 13    End of Session Equipment Utilized During Treatment: Gait belt Activity Tolerance: Patient tolerated treatment well Patient left: in bed;with bed alarm set;with call bell/phone within reach Nurse Communication: Mobility status;Precautions;Weight bearing status;Patient requests pain meds PT Visit Diagnosis: Unsteadiness on feet (R26.81);Other abnormalities of gait and mobility (R26.89);Repeated falls (R29.6);Muscle weakness (generalized) (M62.81);History of falling (Z91.81);Difficulty in walking, not elsewhere classified (R26.2);Pain Pain - Right/Left: Left Pain - part of body: Knee     Time: 3785-8850 PT Time Calculation (min) (ACUTE ONLY): 25 min  Charges:  $Therapeutic Exercise: 8-22 mins $Therapeutic Activity: 8-22 mins                     Kreg Shropshire, DPT 01/20/2021, 3:48 PM

## 2021-01-21 LAB — CBG MONITORING, ED
Glucose-Capillary: 119 mg/dL — ABNORMAL HIGH (ref 70–99)
Glucose-Capillary: 146 mg/dL — ABNORMAL HIGH (ref 70–99)
Glucose-Capillary: 131 mg/dL — ABNORMAL HIGH (ref 70–99)
Glucose-Capillary: 101 mg/dL — ABNORMAL HIGH (ref 70–99)

## 2021-01-21 NOTE — Progress Notes (Addendum)
Physical Therapy Treatment Patient Details Name: Corey Gonzalez MRN: [id number] DOB: 11-26-1940 Today's Date: 01/21/2021   History of Present Illness Pt is a 80 y.o. male presenting to hospital 11/27 c/o L knee and thigh pain s/p fall 11/23.  Of note, pt s/p L ORIF distal femur fx/periprosthetic fx 12/31/20; (+) COVID-19 11/15; colon resection in September with SNF stay.  CT of the left knee shows the possibly increased displacement of the oblique fracture of distal femoral diaphysis without any new fractures or dislocations noted.  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    PT Comments    Pt continues to be pleasant and motivated with PT and showed good effort with all tasks.  Pt did well keeping weight of L LE during standing acts and though he did fatigue with EOB standing/hopping exercises he did manage relatively well.  HR in the 70s at rest, up to 90s with activity, O2 remains in the high 90s t/o the session.      Recommendations for follow up therapy are one component of a multi-disciplinary discharge planning process, led by the attending physician.  Recommendations may be updated based on patient status, additional functional criteria and insurance authorization.  Follow Up Recommendations  Skilled nursing-short term rehab (<3 hours/day)     Assistance Recommended at Discharge Frequent or constant Supervision/Assistance  Equipment Recommendations   (TBD at rehab)    Recommendations for Other Services       Precautions / Restrictions Precautions Precautions: Fall Precaution Comments: LLQ colostomy; Limit L knee range of motion to 0-90 Restrictions LLE Weight Bearing: Non weight bearing     Mobility  Bed Mobility Overal bed mobility: Needs Assistance Bed Mobility: Supine to Sit;Sit to Supine     Supine to sit: Supervision Sit to supine: Supervision   General bed mobility  comments: Only minimal hesitancy with L LE movement, able to get to/from supine w/o physical assist    Transfers Overall transfer level: Needs assistance Equipment used: Rolling walker (2 wheels) Transfers: Sit to/from Stand Sit to Stand: Min assist;From elevated surface           General transfer comment: Pt was able do 2 bouts of standing, needed light assist each time along with cuing for hand placement and set up with R LE    Ambulation/Gait               General Gait Details: Pt able to manage side hopping (and occasional heel-toe shifting)   Stairs             Wheelchair Mobility    Modified Rankin (Stroke Patients Only)       Balance Overall balance assessment: Needs assistance Sitting-balance support: No upper extremity supported;Feet supported Sitting balance-Leahy Scale: Good     Standing balance support: Bilateral upper extremity supported;Reliant on assistive device for balance;During functional activity Standing balance-Leahy Scale: Fair Standing balance comment: Improved ability to maintain standing/position in walker at EOB.  Still needing some cuing to insure hips forward/shoulders back but clearly more confident with static and dynamic standing tasks than yesterday's effort                            Cognition Arousal/Alertness: Awake/alert Behavior During Therapy: WFL for tasks assessed/performed Overall Cognitive Status: Within Functional Limits for tasks assessed  Exercises General Exercises - Lower Extremity Ankle Circles/Pumps: AROM;10 reps (resisted DF) Quad Sets: Strengthening;15 reps Heel Slides: AROM;10 reps (AROM to ~65, pain limited) Hip ABduction/ADduction: AROM;10 reps Straight Leg Raises: AROM;10 reps    General Comments        Pertinent Vitals/Pain Pain Assessment: 0-10 Pain Score: 2  Pain Location: reports relatively low pain at rest that does  increase with activity/movement    Home Living                          Prior Function            PT Goals (current goals can now be found in the care plan section) Progress towards PT goals: Progressing toward goals    Frequency    2X/week      PT Plan Frequency needs to be updated    Co-evaluation              AM-PAC PT "6 Clicks" Mobility   Outcome Measure  Help needed turning from your back to your side while in a flat bed without using bedrails?: A Little Help needed moving from lying on your back to sitting on the side of a flat bed without using bedrails?: A Little Help needed moving to and from a bed to a chair (including a wheelchair)?: A Little Help needed standing up from a chair using your arms (e.g., wheelchair or bedside chair)?: A Lot Help needed to walk in hospital room?: Total Help needed climbing 3-5 steps with a railing? : Total 6 Click Score: 13    End of Session Equipment Utilized During Treatment: Gait belt Activity Tolerance: Patient tolerated treatment well Patient left: in bed;with bed alarm set;with call bell/phone within reach Nurse Communication: Mobility status PT Visit Diagnosis: Unsteadiness on feet (R26.81);Other abnormalities of gait and mobility (R26.89);Repeated falls (R29.6);Muscle weakness (generalized) (M62.81);History of falling (Z91.81);Difficulty in walking, not elsewhere classified (R26.2);Pain Pain - Right/Left: Left Pain - part of body: Knee     Time: 2423-5361 PT Time Calculation (min) (ACUTE ONLY): 27 min  Charges:  $Therapeutic Exercise: 8-22 mins $Therapeutic Activity: 8-22 mins                     Kreg Shropshire, DPT 01/21/2021, 1:21 PM

## 2021-01-21 NOTE — ED Notes (Signed)
Pt in bed, changed pt's ostomy bag, pt bag was starting to fall off, skin protectant wipe used.  Pt tolerated well.

## 2021-01-22 LAB — CBG MONITORING, ED
Glucose-Capillary: 195 mg/dL — ABNORMAL HIGH (ref 70–99)
Glucose-Capillary: 144 mg/dL — ABNORMAL HIGH (ref 70–99)
Glucose-Capillary: 95 mg/dL (ref 70–99)
Glucose-Capillary: 162 mg/dL — ABNORMAL HIGH (ref 70–99)

## 2021-01-22 NOTE — ED Notes (Signed)
Bg 144

## 2021-01-22 NOTE — ED Provider Notes (Signed)
Today's Vitals   01/21/21 1259 01/21/21 1300 01/21/21 1541 01/22/21 0258  BP:  116/67  123/60  Pulse:  66  71  Resp:  18  17  Temp:  98 F (36.7 C)  98 F (36.7 C)  TempSrc:  Oral  Oral  SpO2:  100%  97%  Weight:      Height:      PainSc: 4  4  Asleep    Body mass index is 20.2 kg/m.   Patient resting comfortably.  No acute events overnight.  Awaiting social work disposition.   Corey Gonzalez, [name], DO 01/22/21 [phone]

## 2021-01-22 NOTE — TOC Progression Note (Signed)
Transition of Care [facility]) - Progression Note    Patient Details  Name: Corey Gonzalez MRN: [id number] Date of Birth: [birth year]  Transition of Care [facility]) CM/SW Contact  Shelbie Hutching, RN Phone Number: 01/22/2021, 12:45 PM  Clinical Narrative:    [name] has 17 new Covid cases and cannot admit today.   Expected Discharge Plan: Skilled Nursing Facility Barriers to Discharge: SNF Pending bed offer  Expected Discharge Plan and Services Expected Discharge Plan: [place]   Discharge Planning Services: CM Consult Post Acute Care Choice: [facility]                   DME Arranged: N/A DME Agency: NA       HH Arranged: NA HH Agency: NA         Social Determinants of Health (SDOH) Interventions    Readmission Risk Interventions No flowsheet data found.

## 2021-01-22 NOTE — ED Notes (Signed)
Pt staples removed from L thigh from ORIF Femur Fracture surgery on 11/16. Total of 2  incision wounds, Approx. 30 staples removed. Upon removing staples, wound was slightly dehiscenced. EDP Goodman at bedside to assess wound. Dressed with 2 Dermaclip. Skin is dry, and free from signs of infection and bleeding.

## 2021-01-22 NOTE — Progress Notes (Signed)
Physical Therapy Treatment Patient Details Name: Corey Gonzalez MRN: [id number] DOB: Jul 14, 1940 Today's Date: 01/22/2021   History of Present Illness Pt is a 80 y.o. male presenting to hospital 11/27 c/o L knee and thigh pain s/p fall 11/23.  Of note, pt s/p L ORIF distal femur fx/periprosthetic fx 12/31/20; (+) COVID-19 11/15; colon resection in September with SNF stay.  CT of the left knee shows the possibly increased displacement of the oblique fracture of distal femoral diaphysis without any new fractures or dislocations noted.  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    PT Comments    Pt continues to show good effort and attitude despite limitations related to [place] on the L.  He consistently shows ability to do AROM with L LE for mobility and maintains NWBing well with standing but is quick to fatigue with standing activities.  He was able to perform 2 separate bouts of standing with EOB side stepping (HR to ~110s with each effort, O2 remains high/mid 90s on room air).  He was too fatigue to attain standing (even with assist) on 3rd attempt and requested to lay back down.  Pt continues to show great effort with PT sessions, eager to get to rehab and ultimately get back to being able to use L LE and walking.  He understands that this is going to take a while but remains in good spirits and show great attitude with PT.   Recommendations for follow up therapy are one component of a multi-disciplinary discharge planning process, led by the attending physician.  Recommendations may be updated based on patient status, additional functional criteria and insurance authorization.  Follow Up Recommendations  Skilled nursing-short term rehab (<3 hours/day)     Assistance Recommended at Discharge Frequent or constant Supervision/Assistance  Equipment Recommendations   (TBD at rehab)    Recommendations  for Other Services       Precautions / Restrictions Precautions Precautions: Fall Precaution Comments: LLQ colostomy; Limit L knee range of motion to 0-90 Restrictions LLE Weight Bearing: Non weight bearing     Mobility  Bed Mobility Overal bed mobility: Needs Assistance Bed Mobility: Supine to Sit;Sit to Supine     Supine to sit: Supervision Sit to supine: Supervision   General bed mobility comments: Only minimal hesitancy with L LE movement, able to get to/from supine w/o physical assist    Transfers Overall transfer level: Needs assistance Equipment used: Rolling walker (2 wheels) Transfers: Sit to/from Stand Sit to Stand: Min assist;From elevated surface           General transfer comment: Pt was able do 2 bouts of standing, needed light assist each time along with cuing for hand placement and set up with R LE.  Unable to rise on 3rd attempt secondary to fatigue    Ambulation/Gait               General Gait Details: again able to do some side hopping steps (able to maintain L NWBing well) along EOB but per his usual quick to fatigue and needing to sit after limited effort   Stairs             Wheelchair Mobility    Modified Rankin (Stroke Patients Only)       Balance Overall balance assessment: Needs assistance Sitting-balance support: No upper extremity supported;Feet supported Sitting balance-Leahy Scale: Good     Standing  balance support: Bilateral upper extremity supported;Reliant on assistive device for balance;During functional activity Standing balance-Leahy Scale: Fair Standing balance comment: Improved ability to maintain standing/position in walker at EOB.  Still needing some cuing to insure hips forward/shoulders back but clearly more confident with static and dynamic standing tasks than yesterday's effort                            Cognition Arousal/Alertness: Awake/alert Behavior During Therapy: WFL for tasks  assessed/performed Overall Cognitive Status: Within Functional Limits for tasks assessed                                          Exercises General Exercises - Lower Extremity Ankle Circles/Pumps: AROM;10 reps Quad Sets: Strengthening;20 reps Short Arc Quad: AROM;10 reps Heel Slides: AROM;10 reps (better tolerance with AROM to ~70) Hip ABduction/ADduction: AROM;10 reps Straight Leg Raises: AROM;10 reps    General Comments        Pertinent Vitals/Pain Pain Assessment: 0-10 Pain Score: 4  Pain Location: reports relatively low pain at rest that does increase with activity/movement    Home Living                          Prior Function            PT Goals (current goals can now be found in the care plan section) Progress towards PT goals: Progressing toward goals    Frequency    Min 2X/week      PT Plan Frequency needs to be updated    Co-evaluation              AM-PAC PT "6 Clicks" Mobility   Outcome Measure  Help needed turning from your back to your side while in a flat bed without using bedrails?: A Little Help needed moving from lying on your back to sitting on the side of a flat bed without using bedrails?: A Little Help needed moving to and from a bed to a chair (including a wheelchair)?: A Little Help needed standing up from a chair using your arms (e.g., wheelchair or bedside chair)?: A Lot Help needed to walk in hospital room?: Total Help needed climbing 3-5 steps with a railing? : Total 6 Click Score: 13    End of Session Equipment Utilized During Treatment: Gait belt Activity Tolerance: Patient tolerated treatment well Patient left: in bed;with bed alarm set;with call bell/phone within reach Nurse Communication: Mobility status PT Visit Diagnosis: Unsteadiness on feet (R26.81);Other abnormalities of gait and mobility (R26.89);Repeated falls (R29.6);Muscle weakness (generalized) (M62.81);History of falling  (Z91.81);Difficulty in walking, not elsewhere classified (R26.2);Pain Pain - Right/Left: Left Pain - part of body: Knee     Time: 6606-0045 PT Time Calculation (min) (ACUTE ONLY): 27 min  Charges:  $Therapeutic Exercise: 8-22 mins $Therapeutic Activity: 8-22 mins                     Kreg Shropshire, DPT 01/22/2021, 3:16 PM

## 2021-01-22 NOTE — ED Notes (Signed)
Pt linen and shirt changed. Pt also provided with meal tray.

## 2021-01-23 LAB — CBG MONITORING, ED
Glucose-Capillary: 233 mg/dL — ABNORMAL HIGH (ref 70–99)
Glucose-Capillary: 132 mg/dL — ABNORMAL HIGH (ref 70–99)
Glucose-Capillary: 103 mg/dL — ABNORMAL HIGH (ref 70–99)
Glucose-Capillary: 107 mg/dL — ABNORMAL HIGH (ref 70–99)

## 2021-01-23 NOTE — ED Notes (Signed)
Pt. Resting comfortably in hospital bed, watching tv. Pt. Denies any further need currently, no complaints, NAD.

## 2021-01-23 NOTE — ED Notes (Signed)
Pt. Alert and oriented, warm and dry, in no distress.  Pt. Encouraged to let nursing staff know of any concerns or needs.

## 2021-01-23 NOTE — ED Notes (Signed)
Pt given graham crackers, peanut butter, and diet drink per his request.

## 2021-01-23 NOTE — Progress Notes (Signed)
Physical Therapy Treatment Patient Details Name: Corey Gonzalez MRN: [id number] DOB: [birth year] Today's Date: 01/23/2021   History of Present Illness Pt is a 80 y.o. male presenting to hospital 11/27 c/o L knee and thigh pain s/p fall 11/23.  Of note, pt s/p L ORIF distal femur fx/periprosthetic fx 12/31/20; (+) COVID-19 11/15; colon resection in September with SNF stay.  CT of the left knee shows the possibly increased displacement of the oblique fracture of distal femoral diaphysis without any new fractures or dislocations noted.  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    PT Comments    Participated in exercises as described below.  Bed mobility with supervision.  Steady in sitting.  He is able to stand x 3 with RW and min/mod a x 1 from elevated bed height.  Overall standing and tolerance has improved this week but remains quite limited.  He is able to take some good lateral hops and maintain NWB but fatigues quickly.  He does sound a bit "tight" today which I had not noticed previously.  Sats 98/99% with no c/o difficulty breathing.  He is encouraged to get to a recliner but declines stated he is comfortable in bed and will sit EOB.  RN notified.   Recommendations for follow up therapy are one component of a multi-disciplinary discharge planning process, led by the attending physician.  Recommendations may be updated based on patient status, additional functional criteria and insurance authorization.  Follow Up Recommendations  Skilled nursing-short term rehab (<3 hours/day)     Assistance Recommended at Discharge Frequent or constant Supervision/Assistance  Equipment Recommendations       Recommendations for Other Services       Precautions / Restrictions Precautions Precautions: Fall Precaution Comments: LLQ colostomy; Limit L knee range of motion to 0-90 Restrictions Weight Bearing  Restrictions: Yes LLE Weight Bearing: Non weight bearing Other Position/Activity Restrictions: Per Dr. Thompson Caul note 11/27 the on call orthopedist (Dr. Roland Rack) recommending NWB L LE.     Mobility  Bed Mobility Overal bed mobility: Needs Assistance Bed Mobility: Supine to Sit;Sit to Supine     Supine to sit: Supervision          Transfers Overall transfer level: Needs assistance Equipment used: Rolling walker (2 wheels) Transfers: Sit to/from Stand Sit to Stand: Min assist;From elevated surface           General transfer comment: 3 stands with good ability to maintain NWB today    Ambulation/Gait Ambulation/Gait assistance: Min assist Gait Distance (Feet): 4 Feet Assistive device: Rolling walker (2 wheels) Gait Pattern/deviations: Step-to pattern Gait velocity: decreased     General Gait Details: hopping left and right with NWB limited by fatigue   Stairs             Wheelchair Mobility    Modified Rankin (Stroke Patients Only)       Balance Overall balance assessment: Needs assistance Sitting-balance support: No upper extremity supported;Feet supported Sitting balance-Leahy Scale: Good     Standing balance support: Bilateral upper extremity supported;Reliant on assistive device for balance;During functional activity Standing balance-Leahy Scale: Fair Standing balance comment: Improved ability to maintain standing/position in walker at EOB.  Still needing some cuing to insure hips forward/shoulders back but confidence and tolerance improving  Cognition Arousal/Alertness: Awake/alert Behavior During Therapy: WFL for tasks assessed/performed Overall Cognitive Status: Within Functional Limits for tasks assessed                                          Exercises Total Joint Exercises Ankle Circles/Pumps: AROM;Strengthening;Both;10 reps;Supine Quad Sets: AROM;Strengthening;Both;10  reps;Supine Short Arc Quad: AROM;Right;AAROM;Left;Strengthening;10 reps;Supine Heel Slides: AROM;Strengthening;Both;10 reps;Supine (through minimla ROM < 90 degrees throughotu) Hip ABduction/ADduction: AROM;Right;AAROM;Left;Strengthening;10 reps;Supine    General Comments        Pertinent Vitals/Pain Pain Assessment: Faces Faces Pain Scale: Hurts a little bit Pain Location: reports relatively low pain at rest that does increase with activity/movement Pain Descriptors / Indicators: Aching;Sore;Tender Pain Intervention(s): Limited activity within patient's tolerance;Repositioned    Home Living                          Prior Function            PT Goals (current goals can now be found in the care plan section) Progress towards PT goals: Progressing toward goals    Frequency    Min 2X/week      PT Plan Current plan remains appropriate    Co-evaluation              AM-PAC PT "6 Clicks" Mobility   Outcome Measure  Help needed turning from your back to your side while in a flat bed without using bedrails?: A Little Help needed moving from lying on your back to sitting on the side of a flat bed without using bedrails?: A Little Help needed moving to and from a bed to a chair (including a wheelchair)?: A Little Help needed standing up from a chair using your arms (e.g., wheelchair or bedside chair)?: A Lot Help needed to walk in hospital room?: A Lot Help needed climbing 3-5 steps with a railing? : Total 6 Click Score: 14    End of Session Equipment Utilized During Treatment: Gait belt Activity Tolerance: Patient tolerated treatment well Patient left: in bed;with bed alarm set;with call bell/phone within reach Nurse Communication: Mobility status PT Visit Diagnosis: Unsteadiness on feet (R26.81);Other abnormalities of gait and mobility (R26.89);Repeated falls (R29.6);Muscle weakness (generalized) (M62.81);History of falling (Z91.81);Difficulty in walking,  not elsewhere classified (R26.2);Pain Pain - Right/Left: Left Pain - part of body: Knee     Time: 2979-8921 PT Time Calculation (min) (ACUTE ONLY): 14 min  Charges:  $Therapeutic Exercise: 8-22 mins                    Chesley Noon, PTA 01/23/21, 10:54 AM

## 2021-01-23 NOTE — ED Notes (Signed)
Pt ringing call bell, denies needs at this time.

## 2021-01-23 NOTE — TOC Progression Note (Signed)
Transition of Care [facility]) - Progression Note    Patient Details  Name: NOE GOYER MRN: [id number] Date of Birth: 1940-06-10  Transition of Care [facility]) CM/SW Contact  Shelbie Hutching, RN Phone Number: 01/23/2021, 1:29 PM  Clinical Narrative:    [name] still not admitting they are hoping they can admit on Monday.   Patient has no other bed offers.     Expected Discharge Plan: Skilled Nursing Facility Barriers to Discharge: SNF Pending bed offer  Expected Discharge Plan and Services Expected Discharge Plan: [place]   Discharge Planning Services: CM Consult Post Acute Care Choice: [place]                   DME Arranged: N/A DME Agency: NA       HH Arranged: NA HH Agency: NA         Social Determinants of Health (SDOH) Interventions    Readmission Risk Interventions No flowsheet data found.

## 2021-01-23 NOTE — ED Notes (Signed)
Provided patient with diet shasta lemon lime with pain medication. Pt resting intermittently and reported he did not need anything further at this time.

## 2021-01-24 LAB — CBG MONITORING, ED
Glucose-Capillary: 115 mg/dL — ABNORMAL HIGH (ref 70–99)
Glucose-Capillary: 173 mg/dL — ABNORMAL HIGH (ref 70–99)
Glucose-Capillary: 119 mg/dL — ABNORMAL HIGH (ref 70–99)
Glucose-Capillary: 102 mg/dL — ABNORMAL HIGH (ref 70–99)

## 2021-01-24 LAB — CBC
HCT: 27.8 % — ABNORMAL LOW (ref 39.0–52.0)
Hemoglobin: 9 g/dL — ABNORMAL LOW (ref 13.0–17.0)
MCH: 30.6 pg (ref 26.0–34.0)
MCHC: 32.4 g/dL (ref 30.0–36.0)
MCV: 94.6 fL (ref 80.0–100.0)
Platelets: 154 10*3/uL (ref 150–400)
RBC: 2.94 MIL/uL — ABNORMAL LOW (ref 4.22–5.81)
RDW: 15 % (ref 11.5–15.5)
WBC: 2.9 10*3/uL — ABNORMAL LOW (ref 4.0–10.5)
nRBC: 0 % (ref 0.0–0.2)

## 2021-01-24 NOTE — ED Provider Notes (Signed)
-----------------------------------------   5:41 AM on 01/24/2021 -----------------------------------------   Blood pressure 116/75, pulse 74, temperature 98.4 F (36.9 C), temperature source Oral, resp. rate 16, height 5\' 7"  (1.702 m), weight 58.5 kg, SpO2 95 %.  The patient is calm and cooperative at this time.  There have been no acute events since the last update.  Awaiting disposition plan from social worker.   Paulette Blanch, MD 01/24/21 [phone]

## 2021-01-24 NOTE — ED Notes (Signed)
Pt. Alert and oriented, warm and dry, in no distress. Pt. Encouraged to let nursing staff know of any concerns or needs.

## 2021-01-24 NOTE — ED Notes (Signed)
Colostomy emptied per patient request. Repositioned in bed for comfort.

## 2021-01-24 NOTE — ED Notes (Signed)
Snack provided to patient per request.

## 2021-01-24 NOTE — Progress Notes (Signed)
Occupational Therapy Treatment Patient Details Name: Corey Gonzalez MRN: [id number] DOB: 08/06/1940 Today's Date: 01/24/2021   History of present illness Pt is a 80 y.o. male presenting to hospital 11/27 c/o L knee and thigh pain s/p fall 11/23.  Of note, pt s/p L ORIF distal femur fx/periprosthetic fx 12/31/20; (+) COVID-19 11/15; colon resection in September with SNF stay.  CT of the left knee shows the possibly increased displacement of the oblique fracture of distal femoral diaphysis without any new fractures or dislocations noted.  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   OT comments  Pt seen for OT tx this date to f/u re; safety with ADLs/ADL mobility. Pt requires MIN/MOD A to CTS with RW from regular bed height, x3 trials, taking hopping steps on first two trials (~4-5 steps) and performing mini squats on R LE to strengthen R quad and bilateral triceps x7 on third standing trial. In addition, OT engage pt in seated toileting to clean and empty colostomy with SETUP. Pt returned to bed end of session with all needs met and in reach.    Recommendations for follow up therapy are one component of a multi-disciplinary discharge planning process, led by the attending physician.  Recommendations may be updated based on patient status, additional functional criteria and insurance authorization.    Follow Up Recommendations  Skilled nursing-short term rehab (<3 hours/day)    Assistance Recommended at Discharge Frequent or constant Supervision/Assistance  Equipment Recommendations  None recommended by OT    Recommendations for Other Services      Precautions / Restrictions Precautions Precautions: Fall Precaution Comments: LLQ colostomy; Limit L knee range of motion to 0-90 Restrictions Weight Bearing Restrictions: Yes LLE Weight Bearing: Non weight bearing Other Position/Activity  Restrictions: Per Dr. Thompson Caul note 11/27 the on call orthopedist (Dr. Roland Rack) recommending NWB L LE.       Mobility Bed Mobility Overal bed mobility: Needs Assistance Bed Mobility: Supine to Sit;Sit to Supine     Supine to sit: Supervision Sit to supine: Supervision        Transfers Overall transfer level: Needs assistance Equipment used: Rolling walker (2 wheels) Transfers: Sit to/from Stand Sit to Stand: Min assist;Mod assist           General transfer comment: MIN/MOD A from regular bed height, to CTS x3 trials, taking hopping steps on first two trials and performing mini squats on R LE to strengthen R quad and bilateral triceps x7 on third standing trial.     Balance Overall balance assessment: Needs assistance Sitting-balance support: No upper extremity supported;Feet supported Sitting balance-Leahy Scale: Good     Standing balance support: Bilateral upper extremity supported;Reliant on assistive device for balance;During functional activity Standing balance-Leahy Scale: Fair                             ADL either performed or assessed with clinical judgement   ADL Overall ADL's : Needs assistance/impaired                             Toileting- Clothing Manipulation and Hygiene: Set up Toileting - Clothing Manipulation Details (indicate cue type and reason): sitting to empty colostomy bag     Functional mobility during ADLs: Minimal assistance;Rolling walker (2 wheels) (to take ~4-5 hopping steps at  bed side on his R LE x2 trials)      Extremity/Trunk Assessment              Vision       Perception     Praxis      Cognition Arousal/Alertness: Awake/alert Behavior During Therapy: WFL for tasks assessed/performed Overall Cognitive Status: Within Functional Limits for tasks assessed                                 General Comments: A&Ox4          Exercises Other Exercises Other Exercises: Ot engages pt  in 3 stands, 2 sets of hopping steps on R LE to improve tolerance for fxl mobility and 1 set of 7 reps of mini squats with R LE using RW to incerase tricep strength as well.   Shoulder Instructions       General Comments      Pertinent Vitals/ Pain       Pain Assessment: Faces Faces Pain Scale: Hurts a little bit Pain Location: reports relatively low pain at rest that does increase with activity/movement Pain Descriptors / Indicators: Aching;Sore;Tender Pain Intervention(s): Limited activity within patient's tolerance;Repositioned  Home Living                                          Prior Functioning/Environment              Frequency  Min 2X/week        Progress Toward Goals  OT Goals(current goals can now be found in the care plan section)  Progress towards OT goals: Progressing toward goals  Acute Rehab OT Goals Patient Stated Goal: to get out of hosptial soon OT Goal Formulation: With patient Time For Goal Achievement: 01/27/21 Potential to Achieve Goals: Good  Plan Discharge plan remains appropriate;Frequency remains appropriate    Co-evaluation                 AM-PAC OT "6 Clicks" Daily Activity     Outcome Measure   Help from another person eating meals?: None Help from another person taking care of personal grooming?: A Little Help from another person toileting, which includes using toliet, bedpan, or urinal?: A Little Help from another person bathing (including washing, rinsing, drying)?: A Lot Help from another person to put on and taking off regular upper body clothing?: A Little Help from another person to put on and taking off regular lower body clothing?: A Lot 6 Click Score: 17    End of Session Equipment Utilized During Treatment: Gait belt;Rolling walker (2 wheels)  OT Visit Diagnosis: Unsteadiness on feet (R26.81);History of falling (Z91.81)   Activity Tolerance Patient tolerated treatment well;Patient limited  by pain   Patient Left in bed;with call bell/phone within reach;with bed alarm set   Nurse Communication Mobility status        Time: 7342-8768 OT Time Calculation (min): 24 min  Charges: OT General Charges $OT Visit: 1 Visit OT Treatments $Therapeutic Activity: 8-22 mins $Therapeutic Exercise: 8-22 mins  Gerrianne Scale, [place], OTR/L ascom [phone] 01/24/21, 5:11 PM

## 2021-01-25 LAB — CBG MONITORING, ED
Glucose-Capillary: 112 mg/dL — ABNORMAL HIGH (ref 70–99)
Glucose-Capillary: 179 mg/dL — ABNORMAL HIGH (ref 70–99)
Glucose-Capillary: 145 mg/dL — ABNORMAL HIGH (ref 70–99)
Glucose-Capillary: 135 mg/dL — ABNORMAL HIGH (ref 70–99)

## 2021-01-25 NOTE — ED Provider Notes (Signed)
Today's Vitals   01/24/21 2135 01/25/21 0231 01/25/21 0411 01/25/21 0607  BP: 119/73 121/69  100/71  Pulse: 81 78  80  Resp:  16  16  Temp:    98.1 F (36.7 C)  TempSrc:    Oral  SpO2: 97% 98%  98%  Weight:      Height:      PainSc:   0-No pain    Body mass index is 20.2 kg/m.   No acute events overnight.  Patient resting comfortably.  Awaiting social work placement.   Tarrell Debes, [name], DO 01/25/21 [phone]

## 2021-01-25 NOTE — ED Notes (Signed)
Pt sleeping. 

## 2021-01-25 NOTE — ED Notes (Signed)
Pt complaint of left knee pain.

## 2021-01-25 NOTE — ED Notes (Signed)
Pt is reporting 5/10 pain. Will administer dose of PRN pain medication

## 2021-01-25 NOTE — ED Notes (Signed)
RN to bedside to introduce self to pt. Pt sleeping.

## 2021-01-26 LAB — CBG MONITORING, ED
Glucose-Capillary: 127 mg/dL — ABNORMAL HIGH (ref 70–99)
Glucose-Capillary: 142 mg/dL — ABNORMAL HIGH (ref 70–99)
Glucose-Capillary: 148 mg/dL — ABNORMAL HIGH (ref 70–99)
Glucose-Capillary: 111 mg/dL — ABNORMAL HIGH (ref 70–99)

## 2021-01-26 NOTE — TOC Progression Note (Signed)
Transition of Care [facility]) - Progression Note    Patient Details  Name: Corey Gonzalez MRN: [id number] Date of Birth: 04-03-1940  Transition of Care [facility]) CM/SW Contact  Shelbie Hutching, RN Phone Number: 01/26/2021, 3:50 PM  Clinical Narrative:    [name] with [facility] COVID outbreak does not know when they will be able to accept admissions.  RNCM met with patient at the bedside to discuss discharge planning.  Patient insists that he cannot go home, it is his significant other's home and she can't care for him and so significant other's daughter refuses to let patient return until he can walk.  He is non  weight bearing on the left leg.   Patient has limited money in the back and is unable to pay for long term SNF care but he is interested in [place].  RNCM reached out to [employer] in [place] to start a Medicaid application for patient for long term Medicaid.    [facility] has offered a bed RNCM calling to make sure they can truly accept patient.     Expected Discharge Plan: Skilled Nursing Facility Barriers to Discharge: SNF Pending bed offer  Expected Discharge Plan and Services Expected Discharge Plan: [place]   Discharge Planning Services: CM Consult Post Acute Care Choice: [place]                   DME Arranged: N/A DME Agency: NA       HH Arranged: NA HH Agency: NA         Social Determinants of Health (SDOH) Interventions    Readmission Risk Interventions No flowsheet data found.

## 2021-01-26 NOTE — Progress Notes (Signed)
Physical Therapy Treatment Patient Details Name: Corey Gonzalez MRN: [id number] DOB: [birth year] Today's Date: 01/26/2021   History of Present Illness Pt is a 80 y.o. male presenting to hospital 11/27 c/o L knee and thigh pain s/p fall 11/23.  Of note, pt s/p L ORIF distal femur fx/periprosthetic fx 12/31/20; (+) COVID-19 11/15; colon resection in September with SNF stay.  CT of the left knee shows the possibly increased displacement of the oblique fracture of distal femoral diaphysis without any new fractures or dislocations noted.  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    PT Comments    Pt is making gradual progress towards goals with ability to perform 3 bouts of ambulation. Fatigues quickly due to LE WBing restrictions. Pt has multiple staff complaints-assisted with problem solving and helping to coordinate care. Able to use RW safely and perform there-ex. Would consider bringing WC for following sessions to assist with transfers and maneuver in hallway. Will continue to progress.  Recommendations for follow up therapy are one component of a multi-disciplinary discharge planning process, led by the attending physician.  Recommendations may be updated based on patient status, additional functional criteria and insurance authorization.  Follow Up Recommendations  Skilled nursing-short term rehab (<3 hours/day)     Assistance Recommended at Discharge Frequent or constant Supervision/Assistance  Equipment Recommendations  Wheelchair (measurements PT);Wheelchair cushion (measurements PT);Rolling walker (2 wheels);BSC/3in1    Recommendations for Other Services OT consult     Precautions / Restrictions Precautions Precautions: Fall Precaution Comments: LLQ colostomy; Limit L knee range of motion to 0-90 Restrictions Weight Bearing Restrictions: Yes LLE Weight Bearing: Non weight  bearing Other Position/Activity Restrictions: pt reports 4-6wk of WBing restrictions due to fx     Mobility  Bed Mobility Overal bed mobility: Needs Assistance Bed Mobility: Supine to Sit Rolling: Supervision   Supine to sit: Supervision Sit to supine: Min guard   General bed mobility comments: slight assist with L LE. Once seated, upright posture noted    Transfers Overall transfer level: Needs assistance Equipment used: Rolling walker (2 wheels) Transfers: Sit to/from Stand Sit to Stand: Min assist           General transfer comment: able to perform multiple reps with safe technique. Once standing, able to maintain correct WBing status    Ambulation/Gait Ambulation/Gait assistance: Min assist Gait Distance (Feet): 18 Feet Assistive device: Rolling walker (2 wheels) Gait Pattern/deviations: Step-to pattern       General Gait Details: 3 reps of 6' in forward/backward direction. Able to use RW and maintain correct WBing. Would ideally need chair follow for out of room mobility. O2 sats stay at 97% with exertion on RA   Stairs             Wheelchair Mobility    Modified Rankin (Stroke Patients Only)       Balance Overall balance assessment: Needs assistance Sitting-balance support: No upper extremity supported;Feet supported Sitting balance-Leahy Scale: Good     Standing balance support: Bilateral upper extremity supported;Reliant on assistive device for balance;During functional activity Standing balance-Leahy Scale: Fair                              Cognition Arousal/Alertness: Awake/alert Behavior During Therapy: WFL for tasks assessed/performed Overall Cognitive Status: Within Functional Limits for tasks assessed  Exercises Other Exercises Other Exercises: seated ther-ex performed on B LE including toe raises, heel raises, LAQ, SAQ, SLRs, and hip abd/add. 12 reps with  supervision    General Comments        Pertinent Vitals/Pain Pain Assessment: 0-10 Pain Score: 5  Pain Location: L LE with sitting Pain Descriptors / Indicators: Aching;Sore;Tender Pain Intervention(s): Limited activity within patient's tolerance;Repositioned    Home Living Family/patient expects to be discharged to:: Private residence Living Arrangements: Spouse/significant other (pt's girlfriend (h/o stroke 2019)) Available Help at Discharge: Family;Available PRN/intermittently Type of Home: House Home Access: Ramped entrance       Home Layout: One level Home Equipment: Conservation officer, nature (2 wheels);Rollator (4 wheels);BSC/3in1;Grab bars - toilet;Grab bars - tub/shower;Shower seat;Wheelchair - Insurance risk surveyor Comments: Pt's significant other unable to physically assist pt.    Prior Function            PT Goals (current goals can now be found in the care plan section) Acute Rehab PT Goals Patient Stated Goal: rehab then home PT Goal Formulation: With patient Time For Goal Achievement: 02/09/21 Potential to Achieve Goals: Good Progress towards PT goals: Progressing toward goals    Frequency    Min 2X/week      PT Plan Current plan remains appropriate    Co-evaluation              AM-PAC PT "6 Clicks" Mobility   Outcome Measure  Help needed turning from your back to your side while in a flat bed without using bedrails?: A Little Help needed moving from lying on your back to sitting on the side of a flat bed without using bedrails?: A Little Help needed moving to and from a bed to a chair (including a wheelchair)?: A Little Help needed standing up from a chair using your arms (e.g., wheelchair or bedside chair)?: A Little Help needed to walk in hospital room?: A Lot Help needed climbing 3-5 steps with a railing? : Total 6 Click Score: 15    End of Session Equipment Utilized During Treatment: Gait belt Activity Tolerance: Patient  tolerated treatment well Patient left: in bed;with bed alarm set Nurse Communication: Mobility status PT Visit Diagnosis: Unsteadiness on feet (R26.81);Other abnormalities of gait and mobility (R26.89);Repeated falls (R29.6);Muscle weakness (generalized) (M62.81);History of falling (Z91.81);Difficulty in walking, not elsewhere classified (R26.2);Pain Pain - Right/Left: Left Pain - part of body: Knee     Time: 1349-1417 PT Time Calculation (min) (ACUTE ONLY): 28 min  Charges:  $Gait Training: 8-22 mins $Therapeutic Exercise: 8-22 mins                     Greggory Stallion, PT, DPT [phone]    Jahmia Berrett 01/26/2021, 4:09 PM

## 2021-01-26 NOTE — ED Provider Notes (Signed)
-----------------------------------------   5:50 AM on 01/26/2021 -----------------------------------------   Blood pressure 123/71, pulse 71, temperature 97.7 F (36.5 C), temperature source Oral, resp. rate 15, height 5\' 7"  (1.702 m), weight 58.5 kg, SpO2 98 %.  The patient is calm and cooperative at this time.  There have been no acute events since the last update.  Awaiting disposition plan from social worker.   Paulette Blanch, MD 01/26/21 [phone]

## 2021-01-26 NOTE — Evaluation (Signed)
Occupational Therapy Re-Evaluation Patient Details Name: Corey Gonzalez MRN: [id number] DOB: [birth year] Today's Date: 01/26/2021   History of Present Illness Pt is a 80 y.o. male presenting to hospital 11/27 c/o L knee and thigh pain s/p fall 11/23.  Of note, pt s/p L ORIF distal femur fx/periprosthetic fx 12/31/20; (+) COVID-19 11/15; colon resection in September with SNF stay.  CT of the left knee shows the possibly increased displacement of the oblique fracture of distal femoral diaphysis without any new fractures or dislocations noted.  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   Clinical Impression   Pt seen for OT re-evaluation and tx this date, after PT session. Pt eager to perform bathing/dressing. Pt able to perform bed mobility with supervision and use of bed rails. Set up for seated bathing, requiring MIN A for washing back and MIN-MOD A for BLE bathing distal of knees, MAX A to doff/don socks (pt reports using dressing stick vs reacher at home to doff MI), and MIN-MOD A for initiating LB dressing over feet to knees where pt was able to complete using log rolls. Pt endorsed 5/10 perceived rate of exertion with bathing/dressing tasks. Pt progressing towards goals, continues to benefit from skilled OT services to maximize pt return to PLOF. OT goals reviewed and updated as appropriate. Continue to recommend SNF at this time.    Recommendations for follow up therapy are one component of a multi-disciplinary discharge planning process, led by the attending physician.  Recommendations may be updated based on patient status, additional functional criteria and insurance authorization.   Follow Up Recommendations  Skilled nursing-short term rehab (<3 hours/day)    Assistance Recommended at Discharge Frequent or constant Supervision/Assistance  Functional Status Assessment  Patient has had a recent  decline in their functional status and demonstrates the ability to make significant improvements in function in a reasonable and predictable amount of time.  Equipment Recommendations  None recommended by OT    Recommendations for Other Services       Precautions / Restrictions Precautions Precautions: Fall Precaution Comments: LLQ colostomy; Limit L knee range of motion to 0-90 Restrictions Weight Bearing Restrictions: Yes LLE Weight Bearing: Non weight bearing Other Position/Activity Restrictions: Per Dr. Thompson Caul note 11/27 the on call orthopedist (Dr. Roland Rack) recommending NWB L LE.      Mobility Bed Mobility Overal bed mobility: Needs Assistance Bed Mobility: Rolling;Supine to Sit;Sit to Supine Rolling: Supervision   Supine to sit: Supervision Sit to supine: Supervision   General bed mobility comments: increased effort, use of bed rails but no direct assist required    Transfers                   General transfer comment: deferred, pt preferred to perform LB dressing and peri bathing from supine position      Balance Overall balance assessment: Needs assistance Sitting-balance support: No upper extremity supported;Feet supported Sitting balance-Leahy Scale: Good                                     ADL either performed or assessed with clinical judgement   ADL Overall ADL's : Needs assistance/impaired     Grooming: Sitting;Set up   Upper Body Bathing: Sitting;Minimal assistance Upper Body Bathing Details (indicate cue type and reason): Min A to wash back Lower  Body Bathing: Bed level;Sitting/lateral leans;Minimal assistance Lower Body Bathing Details (indicate cue type and reason): Pt able to perform LB bathing from seated EOB for washing thighs, supine for washing genitals; MIN A for washing lower LEs + feet Upper Body Dressing : Sitting;Set up   Lower Body Dressing: Bed level;Minimal assistance;Moderate assistance Lower Body Dressing  Details (indicate cue type and reason): MIN A to thread undergarments and pants over his feet to knees where pt was able to complete doffing using log roll L and R to pull up one side at a time                     Vision         Perception     Praxis      Pertinent Vitals/Pain Pain Assessment: 0-10 Pain Score: 5  Pain Location: LLE with R sidelying Pain Descriptors / Indicators: Aching;Sore;Tender Pain Intervention(s): Limited activity within patient's tolerance;Monitored during session;Repositioned     Hand Dominance     Extremity/Trunk Assessment Upper Extremity Assessment Upper Extremity Assessment: Generalized weakness   Lower Extremity Assessment Lower Extremity Assessment: Generalized weakness       Communication Communication Communication: HOH   Cognition Arousal/Alertness: Awake/alert Behavior During Therapy: WFL for tasks assessed/performed Overall Cognitive Status: Within Functional Limits for tasks assessed                                       General Comments       Exercises     Shoulder Instructions      Home Living Family/patient expects to be discharged to:: Private residence Living Arrangements: Spouse/significant other (pt's girlfriend (h/o stroke 2019)) Available Help at Discharge: Family;Available PRN/intermittently Type of Home: House Home Access: Ramped entrance     Home Layout: One level     Bathroom Shower/Tub: Hospital doctor Toilet: Handicapped height     Home Equipment: Conservation officer, nature (2 wheels);Rollator (4 wheels);BSC/3in1;Grab bars - toilet;Grab bars - tub/shower;Shower seat;Wheelchair - Scientist, physiological: Reacher;Sock aid;Other (Comment) (dressing stick) Additional Comments: Pt's significant other unable to physically assist pt.      Prior Functioning/Environment Prior Level of Function : Independent/Modified Independent             Mobility Comments:  Pt was ambulating with 4ww prior to current hospitalization. ADLs Comments: Pt was completing ADLs MOD-I (with 4ww and/or AD) prior to admission. At recent SNF admission, pt was working on IADLs (e.g., laundry and meal prep) d/t decreased activity tolerance        OT Problem List: Decreased strength;Impaired balance (sitting and/or standing);Decreased range of motion;Pain      OT Treatment/Interventions: Self-care/ADL training;Therapeutic exercise;DME and/or AE instruction;Therapeutic activities;Patient/family education;Balance training    OT Goals(Current goals can be found in the care plan section) Acute Rehab OT Goals Patient Stated Goal: get better and get out of the hospital soon OT Goal Formulation: With patient Time For Goal Achievement: 02/09/21 Potential to Achieve Goals: Good  OT Frequency: Min 2X/week   Barriers to D/C:            Co-evaluation              AM-PAC OT "6 Clicks" Daily Activity     Outcome Measure Help from another person eating meals?: None Help from another person taking care of personal grooming?: A Little Help from another person toileting, which  includes using toliet, bedpan, or urinal?: A Little Help from another person bathing (including washing, rinsing, drying)?: A Little Help from another person to put on and taking off regular upper body clothing?: A Little Help from another person to put on and taking off regular lower body clothing?: A Lot 6 Click Score: 18   End of Session    Activity Tolerance: Patient tolerated treatment well Patient left: in bed;with call bell/phone within reach;with bed alarm set  OT Visit Diagnosis: Unsteadiness on feet (R26.81);History of falling (Z91.81)                Time: 1417-1440 OT Time Calculation (min): 23 min Charges:  OT General Charges $OT Visit: 1 Visit OT Evaluation $OT Re-eval: 1 Re-eval OT Treatments $Self Care/Home Management : 8-22 mins  Ardeth Perfect., MPH, MS, OTR/L ascom  [phone] 01/26/21, 3:08 PM

## 2021-01-27 LAB — CBG MONITORING, ED
Glucose-Capillary: 166 mg/dL — ABNORMAL HIGH (ref 70–99)
Glucose-Capillary: 190 mg/dL — ABNORMAL HIGH (ref 70–99)
Glucose-Capillary: 154 mg/dL — ABNORMAL HIGH (ref 70–99)
Glucose-Capillary: 110 mg/dL — ABNORMAL HIGH (ref 70–99)

## 2021-01-27 NOTE — ED Notes (Signed)
Correct supplies found for pt's ostomy bag. Removed previously full ostomy bag with stool and base. Cleaned base with bath wipes. Stoma site pink and no skin irritation or redness. Dried thoroughly and applied skin barrier film. Applied new ostomy (pt needs 1 3/4' to 2" cut out for stoma site). Replaced successfully and secured to skin. Pt reports ostomy pouches normally last approx. 1 week for him at home as long as he has the correct supplies and they are secured firmly to skin.

## 2021-01-27 NOTE — Progress Notes (Signed)
Physical Therapy Treatment Patient Details Name: Corey Gonzalez MRN: [id number] DOB: 12-04-1940 Today's Date: 01/27/2021   History of Present Illness Pt is a 80 y.o. male presenting to hospital 11/27 c/o L knee and thigh pain s/p fall 11/23.  Of note, pt s/p L ORIF distal femur fx/periprosthetic fx 12/31/20; (+) COVID-19 11/15; colon resection in September with SNF stay.  CT of the left knee shows the possibly increased displacement of the oblique fracture of distal femoral diaphysis without any new fractures or dislocations noted.  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    PT Comments    Pt is making gradual progress towards goals. Pt very excited to get out of his room and attempt mobility with WC this date. Able to keep B feet off ground by crossing ankles, but is requesting leg rests. Will attempt to bring [facility] with leg rest for next session. Pt able to self propel WC approx 100' however fatigues with UE use. Needs assist for turning and transfers in/out of WC. O2/HR after exertion 99% on RA and 105bpm respectively. Will continue to progress as able.    Recommendations for follow up therapy are one component of a multi-disciplinary discharge planning process, led by the attending physician.  Recommendations may be updated based on patient status, additional functional criteria and insurance authorization.  Follow Up Recommendations  Skilled nursing-short term rehab (<3 hours/day)     Assistance Recommended at Discharge Frequent or constant Supervision/Assistance  Equipment Recommendations  Wheelchair (measurements PT);Wheelchair cushion (measurements PT);Rolling walker (2 wheels);BSC/3in1    Recommendations for Other Services OT consult     Precautions / Restrictions Precautions Precautions: Fall Precaution Comments: LLQ colostomy; Limit L knee range of motion to  0-90 Restrictions Weight Bearing Restrictions: Yes LLE Weight Bearing: Non weight bearing Other Position/Activity Restrictions: pt reports 4-6wk of WBing restrictions due to fx     Mobility  Bed Mobility Overal bed mobility: Needs Assistance Bed Mobility: Supine to Sit     Supine to sit: Supervision Sit to supine: Supervision   General bed mobility comments: no assist required this date. Once seated, upright posture noted    Transfers Overall transfer level: Needs assistance Equipment used: Rolling walker (2 wheels) Transfers: Sit to/from Stand Sit to Stand: Min assist Stand pivot transfers: Mod assist         General transfer comment: cues for hand placement. Pt has difficulty maintaining WBing status this date    Ambulation/Gait                   Theme park manager mobility: Yes Wheelchair propulsion: Both upper extremities Wheelchair parts: Needs assistance Distance: 100 Wheelchair Assistance Details (indicate cue type and reason): Pt fatigues quickly with very slow propulsion. Pt has difficulty with turns requiring cues for assist. Able to cross ankles and keep off floor  Modified Rankin (Stroke Patients Only)       Balance Overall balance assessment: Needs assistance Sitting-balance support: No upper extremity supported;Feet supported Sitting balance-Leahy Scale: Good     Standing balance support: Bilateral upper extremity supported;Reliant on assistive device for balance;During functional activity Standing balance-Leahy Scale: [place]  Arousal/Alertness: Awake/alert Behavior During Therapy: WFL for tasks assessed/performed Overall Cognitive Status: Within Functional Limits for tasks assessed                                 General Comments: A&Ox4        Exercises      General Comments        Pertinent  Vitals/Pain Pain Assessment: 0-10 Pain Score: 5  Pain Location: L LE with sitting Pain Descriptors / Indicators: Aching;Sore;Tender Pain Intervention(s): Limited activity within patient's tolerance (pt reports he has requested pain meds, but RN hasn't given)    Home Living                          Prior Function            PT Goals (current goals can now be found in the care plan section) Acute Rehab PT Goals Patient Stated Goal: rehab then home PT Goal Formulation: With patient Time For Goal Achievement: 02/09/21 Potential to Achieve Goals: Good Progress towards PT goals: Progressing toward goals    Frequency    Min 2X/week      PT Plan Current plan remains appropriate    Co-evaluation              AM-PAC PT "6 Clicks" Mobility   Outcome Measure  Help needed turning from your back to your side while in a flat bed without using bedrails?: None Help needed moving from lying on your back to sitting on the side of a flat bed without using bedrails?: A Little Help needed moving to and from a bed to a chair (including a wheelchair)?: A Little Help needed standing up from a chair using your arms (e.g., wheelchair or bedside chair)?: A Little Help needed to walk in hospital room?: A Lot Help needed climbing 3-5 steps with a railing? : Total 6 Click Score: 16    End of Session   Activity Tolerance: Patient tolerated treatment well Patient left: in bed Nurse Communication: Mobility status PT Visit Diagnosis: Unsteadiness on feet (R26.81);Other abnormalities of gait and mobility (R26.89);Repeated falls (R29.6);Muscle weakness (generalized) (M62.81);History of falling (Z91.81);Difficulty in walking, not elsewhere classified (R26.2);Pain Pain - Right/Left: Left Pain - part of body: Knee     Time: 4967-5916 PT Time Calculation (min) (ACUTE ONLY): 14 min  Charges:  $Wheel Chair Management: 8-22 mins                     Greggory Stallion, [place],  DPT [phone]    Shernell Saldierna 01/27/2021, 4:28 PM

## 2021-01-27 NOTE — TOC Progression Note (Signed)
Transition of Care [facility]) - Progression Note    Patient Details  Name: Corey Gonzalez MRN: [id number] Date of Birth: 09-29-1940  Transition of Care [place]) CM/SW Contact  Shelbie Hutching, RN Phone Number: 01/27/2021, 4:20 PM  Clinical Narrative:    Medicaid application has been submitted.  RNCM has left messages for [facility] [place] and [facility], both offered beds in the hub, waiting to hear back.     Expected Discharge Plan: Skilled Nursing Facility Barriers to Discharge: SNF Pending bed offer  Expected Discharge Plan and Services Expected Discharge Plan: [place]   Discharge Planning Services: CM Consult Post Acute Care Choice: [place]                   DME Arranged: N/A DME Agency: NA       HH Arranged: NA HH Agency: NA         Social Determinants of Health (SDOH) Interventions    Readmission Risk Interventions No flowsheet data found.

## 2021-01-27 NOTE — ED Notes (Signed)
Pt requested new colostomy bag due to his bag getting full. Did not have size available for replacement- AC contacted to look for replacement supply.

## 2021-01-28 ENCOUNTER — Emergency Department: Payer: No Typology Code available for payment source

## 2021-01-28 LAB — CBG MONITORING, ED
Glucose-Capillary: 153 mg/dL — ABNORMAL HIGH (ref 70–99)
Glucose-Capillary: 139 mg/dL — ABNORMAL HIGH (ref 70–99)

## 2021-01-28 IMAGING — CR DG FEMUR 2+V*L*
1 series · 4 of 4 positions shown · non-contrast
Comparison: Radiographs [DATE] and [DATE].

CLINICAL DATA: Increasing left leg pain with inability to bear
weight. Status post ORIF 3 weeks prior.

EXAM:
LEFT FEMUR 2 VIEWS

[Series 1: dg femur min 2 views left · 0.14mm/px · 4 of 4 slices shown]
[im 1/4]
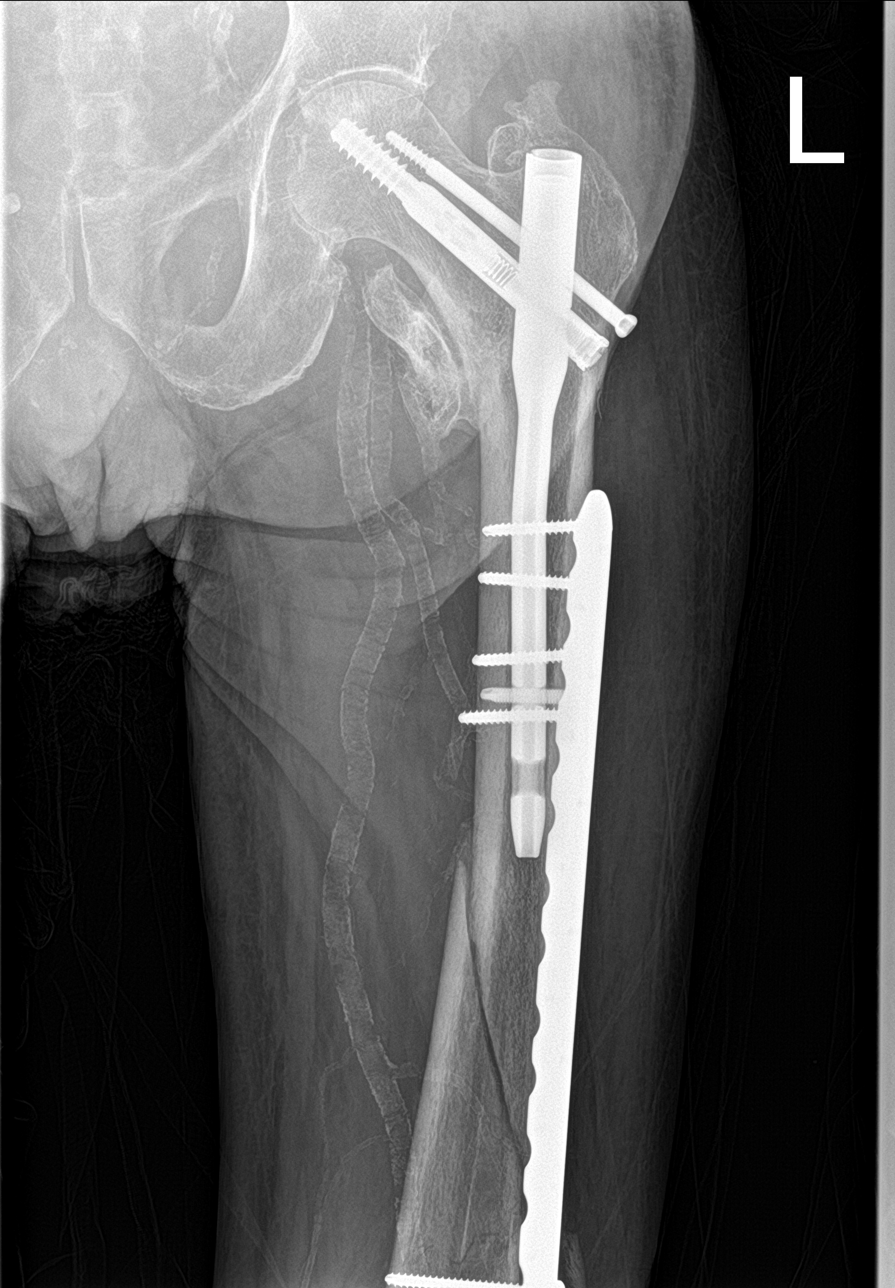
[im 2/4]
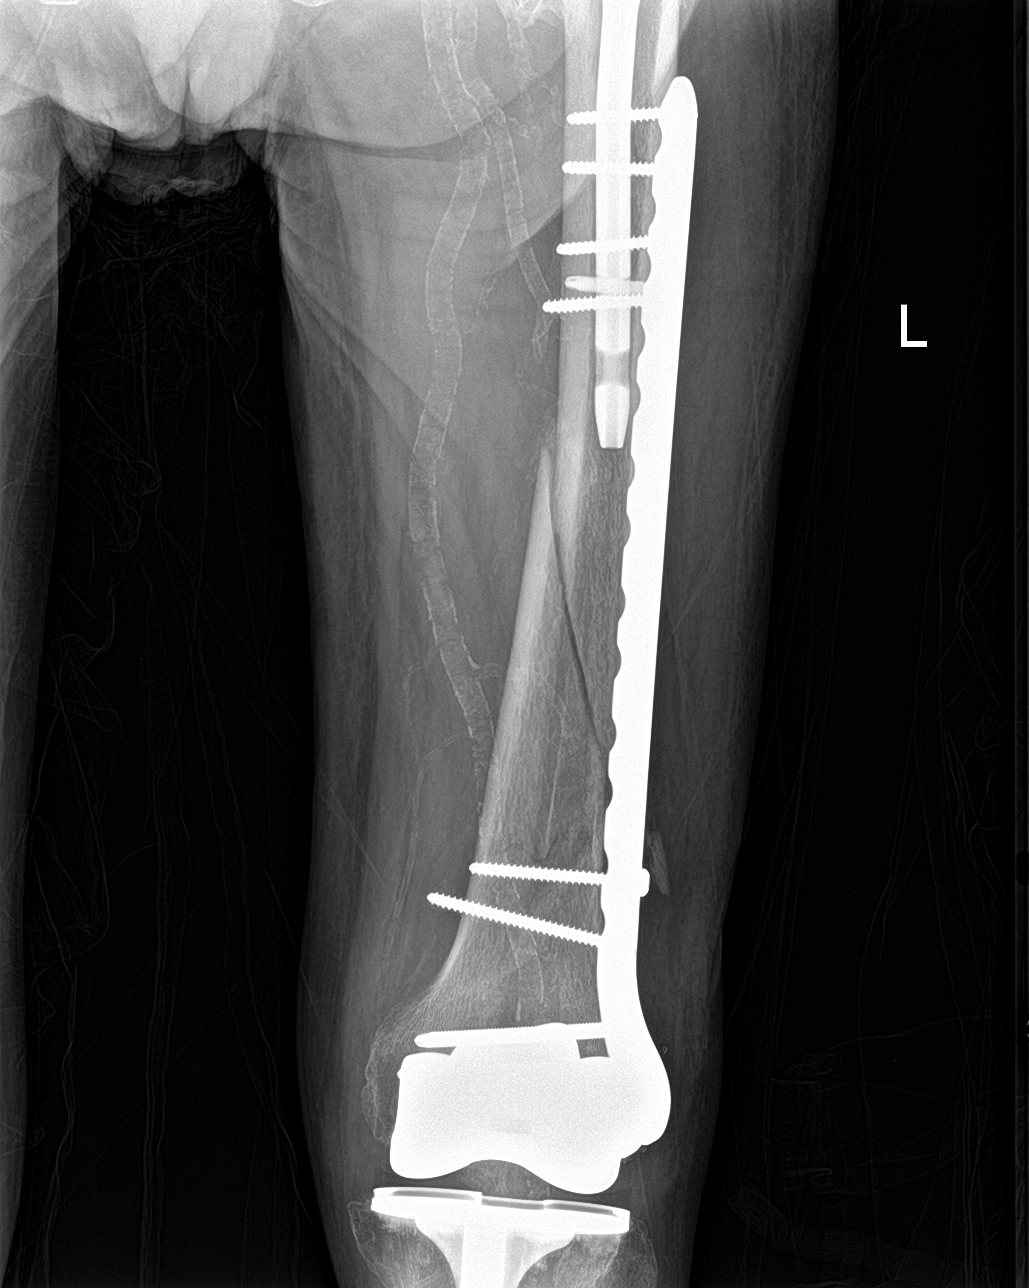
[im 3/4]
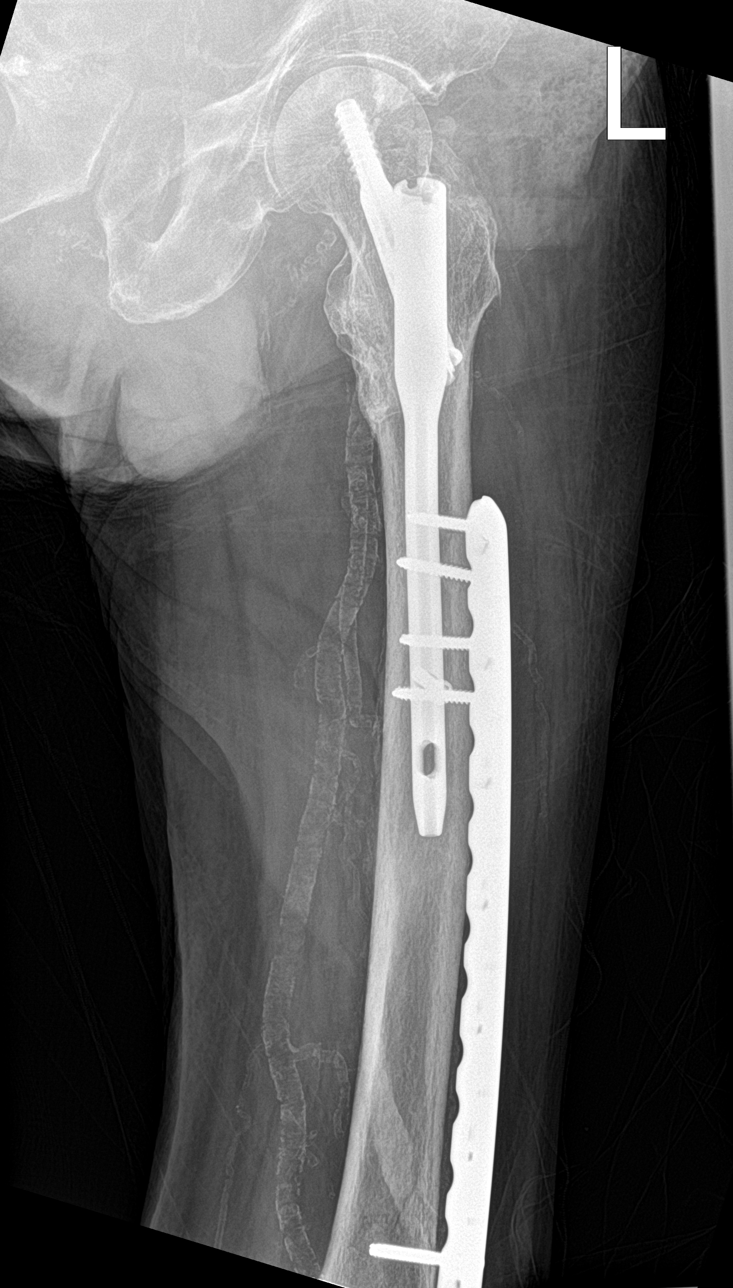
[im 4/4]
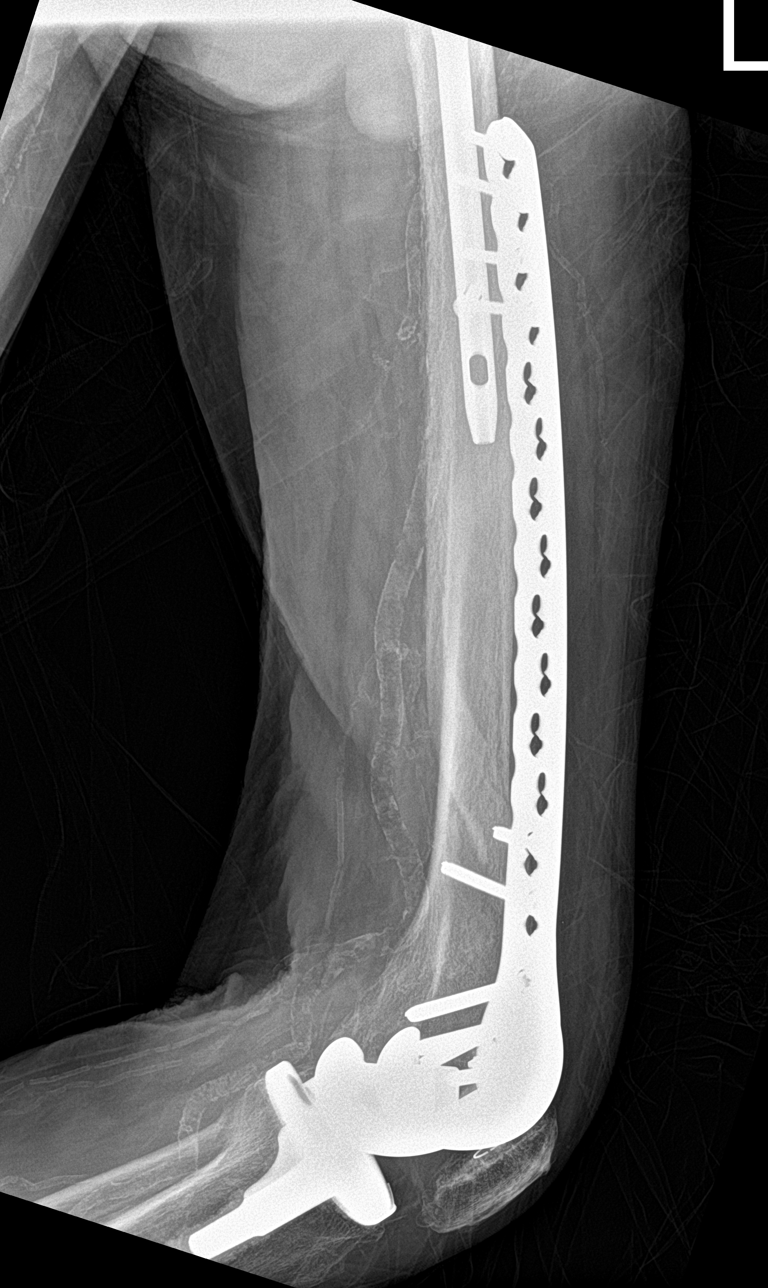

[4 of 4 positions shown; findings below may reference images not displayed]

FINDINGS: The lateral plate and screws traversing the oblique fracture of the
distal femoral diaphysis are unchanged in position. There is stable
mild displacement at the fracture and evidence of mild callus
formation proximally. No evidence of acute fracture or dislocation.
There are stable postsurgical changes related to remote proximal
femoral ORIF and total knee arthroplasty. There is chronic
posttraumatic deformity of the lesser trochanter. Diffuse vascular
calcifications are noted.
IMPRESSION: Intact hardware and stable alignment of the oblique fracture of the
lateral femoral diaphysis post ORIF. No complications identified.

## 2021-01-28 NOTE — ED Notes (Signed)
Called [facility] for transport to [facility]  [place]  [phone]

## 2021-01-28 NOTE — TOC Transition Note (Signed)
Transition of Care [facility]) - CM/SW Discharge Note   Patient Details  Name: Corey Gonzalez MRN: [id number] Date of Birth: 1940/08/15  Transition of Care [facility]) CM/SW Contact:  Shelbie Hutching, RN Phone Number: 01/28/2021, 10:39 AM   Clinical Narrative:     Patient will discharge from [facility] Emergency Department going to [facility] [place].  [place] [address]. Bedside RN calling report to [phone], patient going to room 208.    Final next level of care: Skilled Nursing Facility Barriers to Discharge: Barriers Resolved   Patient Goals and CMS Choice Patient states their goals for this hospitalization and ongoing recovery are:: Patient wants to go to rehab CMS Medicare.gov Compare Post Acute Care list provided to:: Patient Choice offered to / list presented to : Patient  Discharge Placement   Existing PASRR number confirmed : 01/28/21          [facility] Patient to be transferred to facility by: [facility] Name of family member notified: patient will notify family Patient and family notified of of transfer: 01/28/21  Discharge Plan and Services   Discharge Planning Services: CM Consult Post Acute Care Choice: [place]          DME Arranged: N/A DME Agency: NA       HH Arranged: NA HH Agency: NA        Social Determinants of Health (SDOH) Interventions     Readmission Risk Interventions No flowsheet data found.

## 2021-01-28 NOTE — ED Notes (Signed)
Attempted to call [facility] for report again

## 2021-01-28 NOTE — ED Notes (Signed)
Attempted to call report to [facility].

## 2021-01-28 NOTE — ED Provider Notes (Signed)
----------------------------------------- °  9:59 AM on 01/28/2021 ----------------------------------------- Patient has been accepted to a care facility in [facility].  Patient will be discharged once transportation is arranged.   Harvest Dark, MD 01/28/21 [phone]

## 2021-01-28 NOTE — ED Notes (Signed)
Pt discharged to [place] via [facility]. Pt was wheeled out to [facility] and moved to [facility]. All belongings taken with pt including clothes and cell phone. Family aware of transport.

## 2021-03-27 ENCOUNTER — Other Ambulatory Visit: Payer: Self-pay

## 2021-03-27 ENCOUNTER — Encounter (HOSPITAL_COMMUNITY): Payer: Self-pay

## 2021-03-27 ENCOUNTER — Emergency Department (HOSPITAL_COMMUNITY)
Admission: EM | Admit: 2021-03-27 | Discharge: 2021-03-28 | Disposition: A | Discharge status: Home / Self Care | Payer: No Typology Code available for payment source | Attending: Emergency Medicine | Admitting: Emergency Medicine

## 2021-03-27 DIAGNOSIS — I129 Hypertensive chronic kidney disease with stage 1 through stage 4 chronic kidney disease, or unspecified chronic kidney disease: Secondary | ICD-10-CM | POA: Insufficient documentation

## 2021-03-27 DIAGNOSIS — K9400 Colostomy complication, unspecified: Secondary | ICD-10-CM | POA: Insufficient documentation

## 2021-03-27 DIAGNOSIS — Z794 Long term (current) use of insulin: Secondary | ICD-10-CM | POA: Diagnosis not present

## 2021-03-27 DIAGNOSIS — E1122 Type 2 diabetes mellitus with diabetic chronic kidney disease: Secondary | ICD-10-CM | POA: Diagnosis not present

## 2021-03-27 DIAGNOSIS — Z7901 Long term (current) use of anticoagulants: Secondary | ICD-10-CM | POA: Diagnosis not present

## 2021-03-27 DIAGNOSIS — N183 Chronic kidney disease, stage 3 unspecified: Secondary | ICD-10-CM | POA: Insufficient documentation

## 2021-03-27 DIAGNOSIS — K9401 Colostomy hemorrhage: Secondary | ICD-10-CM | POA: Diagnosis present

## 2021-03-27 DIAGNOSIS — Z7984 Long term (current) use of oral hypoglycemic drugs: Secondary | ICD-10-CM | POA: Diagnosis not present

## 2021-03-27 DIAGNOSIS — R791 Abnormal coagulation profile: Secondary | ICD-10-CM | POA: Diagnosis not present

## 2021-03-27 HISTORY — DX: Malignant neoplasm of colon, unspecified: C18.9

## 2021-03-27 LAB — TYPE AND SCREEN
ABO/RH(D): A POS
Antibody Screen: NEGATIVE

## 2021-03-27 LAB — CBC WITH DIFFERENTIAL/PLATELET
Abs Immature Granulocytes: 0.02 10*3/uL (ref 0.00–0.07)
Basophils Absolute: 0.1 10*3/uL (ref 0.0–0.1)
Basophils Relative: 1 %
Eosinophils Absolute: 0.2 10*3/uL (ref 0.0–0.5)
Eosinophils Relative: 4 %
HCT: 28.8 % — ABNORMAL LOW (ref 39.0–52.0)
Hemoglobin: 9 g/dL — ABNORMAL LOW (ref 13.0–17.0)
Immature Granulocytes: 0 %
Lymphocytes Relative: 16 %
Lymphs Abs: 0.9 10*3/uL (ref 0.7–4.0)
MCH: 30 pg (ref 26.0–34.0)
MCHC: 31.3 g/dL (ref 30.0–36.0)
MCV: 96 fL (ref 80.0–100.0)
Monocytes Absolute: 0.6 10*3/uL (ref 0.1–1.0)
Monocytes Relative: 11 %
Neutro Abs: 3.8 10*3/uL (ref 1.7–7.7)
Neutrophils Relative %: 68 %
Platelets: 154 10*3/uL (ref 150–400)
RBC: 3 MIL/uL — ABNORMAL LOW (ref 4.22–5.81)
RDW: 13.9 % (ref 11.5–15.5)
WBC: 5.6 10*3/uL (ref 4.0–10.5)
nRBC: 0 % (ref 0.0–0.2)

## 2021-03-27 LAB — COMPREHENSIVE METABOLIC PANEL
ALT: 17 U/L (ref 0–44)
AST: 22 U/L (ref 15–41)
Albumin: 3.1 g/dL — ABNORMAL LOW (ref 3.5–5.0)
Alkaline Phosphatase: 105 U/L (ref 38–126)
Anion gap: 7 (ref 5–15)
BUN: 30 mg/dL — ABNORMAL HIGH (ref 8–23)
CO2: 25 mmol/L (ref 22–32)
Calcium: 8.8 mg/dL — ABNORMAL LOW (ref 8.9–10.3)
Chloride: 105 mmol/L (ref 98–111)
Creatinine, Ser: 1.38 mg/dL — ABNORMAL HIGH (ref 0.61–1.24)
GFR, Estimated: 52 mL/min — ABNORMAL LOW (ref >60)
Glucose, Bld: 184 mg/dL — ABNORMAL HIGH (ref 70–99)
Potassium: 4.2 mmol/L (ref 3.5–5.1)
Sodium: 137 mmol/L (ref 135–145)
Total Bilirubin: 1 mg/dL (ref 0.3–1.2)
Total Protein: 6.8 g/dL (ref 6.5–8.1)

## 2021-03-27 LAB — PROTIME-INR
INR: 1.3 — ABNORMAL HIGH (ref 0.8–1.2)
Prothrombin Time: 16.1 seconds — ABNORMAL HIGH (ref 11.4–15.2)

## 2021-03-27 MED ORDER — HYDROCODONE-ACETAMINOPHEN 5-325 MG PO TABS
1.0000 | ORAL_TABLET | Freq: Four times a day (QID) | ORAL | Status: DC | PRN
  Administered 2021-03-27: 1 via ORAL
  Filled 2021-03-27: qty 1

## 2021-03-27 NOTE — ED Notes (Signed)
Bag removed. Stoma cleaned. Small cut noted to bottom of stoma that was squirting blood. Held pressure and stopped. T triplett aware

## 2021-03-27 NOTE — ED Notes (Signed)
Pt resting in bed, warm blanket given.

## 2021-03-27 NOTE — ED Notes (Addendum)
New hollister waffer and bag applied. Sec calling [facility] for transport back to [facility]. Pt a/o. Nad. Snack given

## 2021-03-27 NOTE — ED Notes (Signed)
Materials called for hollister colostomy bag

## 2021-03-27 NOTE — ED Notes (Signed)
Pt requesting his hydrocodone 5/325mg  tablet for his chronic knee pain. Edp aware

## 2021-03-27 NOTE — ED Notes (Signed)
Pts nurse, Brandy from [facility] contacted [place] requesting to be updated at [phone]

## 2021-03-27 NOTE — ED Triage Notes (Signed)
Pt arrived via EMS from [place] with complaints of colostomy bag filling with blood. Pt seen for the same at [facility] this week. Colostomy bag noted to have blood. Pt stated he was doing physical therapy this AM and the bleeding started.

## 2021-03-27 NOTE — Discharge Instructions (Signed)
Your exam today found that you have a small puncture type wound at the bottom of your stoma.  We recommend that you stop your blood thinner (Eliquis) for 5 days to allow the wound to heal.  Please follow-up with your primary care provider for recheck, return to the emergency department for any worsening symptoms or if your bleeding recurs.

## 2021-03-27 NOTE — ED Provider Notes (Signed)
[facility] Provider Note   CSN: [id number] Arrival date & time: 03/27/21  1114     History  Chief Complaint  Patient presents with   colostomy problem    CLEVER GERALDO is a 81 y.o. male.  HPI      ILIAS STCHARLES is a 81 y.o. 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  Currently anticoagulated on Eliquis.  He presents to the Emergency Department complaining of blood in his colostomy bag.  He states that he noticed this yesterday after having a routine follow-up with his surgeon at the [place] yesterday.  Yesterday afternoon, he noticed having dark blood mixed with stool in his colostomy bag.  He was seen at a another ER for this last evening.  The bleeding had spontaneously stopped and he was sent back to the nursing facility.  He had physical therapy earlier today and the bleeding reoccurred.  He denies having any abdominal pain, nausea, vomiting, swelling of his abdomen.  No reported trauma of his abdomen.   Home Medications Prior to Admission medications   Medication Sig Start Date End Date Taking? Authorizing Provider  acetaminophen (TYLENOL) 325 MG tablet Take 650 mg by mouth every 6 (six) hours as needed. 03/20/18   [provider]  apixaban (ELIQUIS) 5 MG TABS tablet Take 5 mg by mouth 2 (two) times daily.    [provider]  atorvastatin (LIPITOR) 20 MG tablet Take 20 mg by mouth at bedtime. 09/17/20   [provider]  bisacodyl (DULCOLAX) 10 MG suppository Place 1 suppository (10 mg total) rectally daily as needed for moderate constipation. 09/18/19   Georgette Shell, MD  docusate sodium (COLACE) 100 MG capsule Take 1 capsule (100 mg total) by mouth 2 (two) times daily. 09/18/19   Georgette Shell, MD  DULoxetine (CYMBALTA) 20 MG capsule Take 20 mg by mouth daily.    [provider]  Ensure Max Protein (ENSURE MAX  PROTEIN) LIQD Take 330 mLs (11 oz total) by mouth at bedtime. 01/05/21   Annita Brod, MD  feeding supplement, GLUCERNA SHAKE, (GLUCERNA SHAKE) LIQD Take 237 mLs by mouth 2 (two) times daily between meals. 01/05/21   Annita Brod, MD  HYDROcodone-acetaminophen (NORCO/VICODIN) 5-325 MG tablet Take 1-2 tablets by mouth every 6 (six) hours as needed for moderate pain. 01/02/21   Duanne Guess, PA-C  insulin regular (NOVOLIN R) 100 units/mL injection Inject 3 Units into the skin 3 (three) times daily before meals.    [provider]  Melatonin 3 MG CAPS Take 3 mg by mouth at bedtime as needed.    [provider]  metFORMIN (GLUCOPHAGE) 500 MG tablet Take 1 tablet (500 mg total) by mouth daily with breakfast. 09/18/19 09/17/20  Georgette Shell, MD  methocarbamol (ROBAXIN) 500 MG tablet Take 1 tablet (500 mg total) by mouth every 6 (six) hours as needed for muscle spasms. 01/05/21   Annita Brod, MD  Multiple Vitamins-Minerals (MULTIVITAMIN ADULT, MINERALS,) TABS Take 1 tablet by mouth daily. Patient not taking: No sig reported 05/27/20   [provider]  omeprazole (PRILOSEC) 20 MG capsule Take 20 mg by mouth daily.    [provider]  ondansetron (ZOFRAN) 4 MG tablet Take 1 tablet (4 mg total) by mouth every 6 (six) hours as needed for nausea. 09/16/19   Reche Dixon, PA-C  polyethylene glycol powder [name])  17 GM/SCOOP powder Take 17 g by mouth daily. MIX 17GM (1 CAPFUL) BY MOUTH ONCE EVERY DAY MIX 17 GRAMS (USE BOTTLE CAP) IN LIQUID OF CHOICE AS DIRECTED TO PREVENT CONSTIPATION 09/17/20   [provider]  senna-docusate (SENOKOT-S) 8.6-50 MG tablet Take 1 tablet by mouth at bedtime as needed for mild constipation. 09/18/19   Georgette Shell, MD  tamsulosin (FLOMAX) 0.4 MG CAPS capsule Take 0.4 mg by mouth daily.    [provider]      Allergies    Patient has no known allergies.    Review of Systems   Review of  Systems  Gastrointestinal:        Blood in his colostomy bag  All other systems reviewed and are negative.  Physical Exam Updated Vital Signs BP 123/72    Pulse 86    Temp 98.7 F (37.1 C) (Oral)    Resp 16    Ht 5\' 7"  (1.702 m)    Wt 59 kg    SpO2 100%    BMI 20.37 kg/m  Physical Exam Vitals and nursing note reviewed.  Constitutional:      Appearance: Normal appearance. He is not ill-appearing.  HENT:     Mouth/Throat:     Mouth: Mucous membranes are moist.  Cardiovascular:     Rate and Rhythm: Normal rate and regular rhythm.     Pulses: Normal pulses.  Pulmonary:     Effort: Pulmonary effort is normal.     Breath sounds: Normal breath sounds.  Abdominal:     Palpations: Abdomen is soft.     Tenderness: There is no abdominal tenderness.     Comments: Ileostomy of lower abdomen.  Stoma is soft and pink in color.  No surrounding erythema or excessive warmth.  There is a small puncture type wound at the base of the stoma.  No active bleeding.  Dark red blood within the colostomy bag.    Musculoskeletal:        General: Normal range of motion.  Skin:    General: Skin is warm.     Capillary Refill: Capillary refill takes less than 2 seconds.  Neurological:     General: No focal deficit present.     Mental Status: He is alert.     Sensory: No sensory deficit.     Motor: No weakness.    ED Results / Procedures / Treatments   Labs (all labs ordered are listed, but only abnormal results are displayed) Labs Reviewed  CBC WITH DIFFERENTIAL/PLATELET - Abnormal; Notable for the following components:      Result Value   RBC 3.00 (*)    Hemoglobin 9.0 (*)    HCT 28.8 (*)    All other components within normal limits  COMPREHENSIVE METABOLIC PANEL - Abnormal; Notable for the following components:   Glucose, Bld 184 (*)    BUN 30 (*)    Creatinine, Ser 1.38 (*)    Calcium 8.8 (*)    Albumin 3.1 (*)    GFR, Estimated 52 (*)    All other components within normal limits   PROTIME-INR - Abnormal; Notable for the following components:   Prothrombin Time 16.1 (*)    INR 1.3 (*)    All other components within normal limits  TYPE AND SCREEN    EKG None  Radiology No results found.  Procedures Procedures    Medications Ordered in ED Medications - No data to display  ED Course/ Medical Decision Making/ A&P  Medical Decision Making Amount and/or Complexity of Data Reviewed Labs: ordered.  Risk Prescription drug management.   1625  discussed with general surgery, Dr. Arnoldo Morale who recommends holding his Eliquis x5 days  This patient presents to the ED for concern of blood in his colostomy bag, this involves an extensive number of treatment options, and is a complaint that carries with it a high risk of complications and morbidity.  The differential diagnosis includes bleeding of the stoma, GI bleed   Co morbidities that complicate the patient evaluation  Hypertension, diabetes, history of colon cancer   Additional history obtained:  Additional history obtained from prior medical records External records from outside source obtained and reviewed including ER visit from [facility] 03/26/2021   Lab Tests:  I Ordered, and personally interpreted labs.  The pertinent results include: No leukocytosis, hemoglobin of 9.  This is a one-point drop from his hemoglobin level checked at [facility] yesterday.  Hemoglobin level is near baseline.  Platelet count unremarkable.  INR 1.3.  Electrolytes show serum creatinine of 1.38 and BUN of 30.    Cardiac Monitoring:  The patient was maintained on a cardiac monitor.  I personally viewed and interpreted the cardiac monitored which showed an underlying rhythm of: Sinus rhythm     Consultations Obtained:  I requested consultation with the general surgery, Dr. Aviva Signs,  and discussed lab and imaging findings as well as pertinent plan - they recommend: To have patient  withhold his Eliquis for 5 days to allow time for healing of the wound.     Reevaluation:  After the interventions noted above, I reevaluated the patient and found that they have : Improved.  Colostomy bag was removed by nursing staff and stoma was cleaned.   No active bleeding at the stoma site at this time.  She has been observed in the department without further bleeding.  New colostomy bag placed by nursing staff without difficulty.    Dispostion:  After consideration of the diagnostic results and the patients response to treatment, I feel that the patent would benefit from holding his anticoagulant for 5 days to allow time for healing of the wound.  Patient is agreeable to this plan.  He appears appropriate for discharge back to the nursing facility.         Final Clinical Impression(s) / ED Diagnoses Final diagnoses:  Colostomy complication [facility])    Rx / DC Orders ED Discharge Orders     None         Bufford Lope 03/27/21 2227    Milton Ferguson, MD 03/30/21 1150

## 2021-03-28 NOTE — ED Notes (Addendum)
Food given to pt. Nurse attempted to ask [facility] to pick up pt . They stated they do not have transportation. Pt verbalized he has no friends or family .

## 2021-03-28 NOTE — ED Notes (Signed)
Patient is asleep.  

## 2021-03-28 NOTE — ED Notes (Signed)
Attempted to notify [facility] Gonzalez that pt is on his way back.

## 2021-07-07 ENCOUNTER — Ambulatory Visit (HOSPITAL_COMMUNITY): Admit: 2021-07-07 | Payer: No Typology Code available for payment source | Admitting: Cardiovascular Disease

## 2021-07-07 ENCOUNTER — Emergency Department (HOSPITAL_COMMUNITY): Payer: No Typology Code available for payment source

## 2021-07-07 ENCOUNTER — Encounter (HOSPITAL_COMMUNITY)
Admission: EM | Disposition: A | Discharge status: Hospice | Payer: Self-pay | Source: Skilled Nursing Facility | Attending: Family Medicine

## 2021-07-07 ENCOUNTER — Encounter (HOSPITAL_COMMUNITY): Payer: Self-pay | Admitting: *Deleted

## 2021-07-07 ENCOUNTER — Inpatient Hospital Stay (HOSPITAL_COMMUNITY)
Admission: EM | Admit: 2021-07-07 | Discharge: 2021-07-10 | DRG: 314 | Disposition: A | Discharge status: Hospice | Payer: No Typology Code available for payment source | Source: Skilled Nursing Facility | Attending: Family Medicine | Admitting: Family Medicine

## 2021-07-07 ENCOUNTER — Inpatient Hospital Stay (HOSPITAL_COMMUNITY): Payer: No Typology Code available for payment source

## 2021-07-07 ENCOUNTER — Other Ambulatory Visit: Payer: Self-pay

## 2021-07-07 DIAGNOSIS — Z833 Family history of diabetes mellitus: Secondary | ICD-10-CM

## 2021-07-07 DIAGNOSIS — I214 Non-ST elevation (NSTEMI) myocardial infarction: Secondary | ICD-10-CM

## 2021-07-07 DIAGNOSIS — F1020 Alcohol dependence, uncomplicated: Secondary | ICD-10-CM | POA: Diagnosis not present

## 2021-07-07 DIAGNOSIS — N1831 Chronic kidney disease, stage 3a: Secondary | ICD-10-CM | POA: Diagnosis present

## 2021-07-07 DIAGNOSIS — Z66 Do not resuscitate: Secondary | ICD-10-CM | POA: Diagnosis present

## 2021-07-07 DIAGNOSIS — Z933 Colostomy status: Secondary | ICD-10-CM

## 2021-07-07 DIAGNOSIS — Z8505 Personal history of malignant neoplasm of liver: Secondary | ICD-10-CM

## 2021-07-07 DIAGNOSIS — E785 Hyperlipidemia, unspecified: Secondary | ICD-10-CM | POA: Diagnosis present

## 2021-07-07 DIAGNOSIS — D539 Nutritional anemia, unspecified: Secondary | ICD-10-CM | POA: Diagnosis present

## 2021-07-07 DIAGNOSIS — F1011 Alcohol abuse, in remission: Secondary | ICD-10-CM | POA: Diagnosis present

## 2021-07-07 DIAGNOSIS — I459 Conduction disorder, unspecified: Secondary | ICD-10-CM | POA: Diagnosis present

## 2021-07-07 DIAGNOSIS — N400 Enlarged prostate without lower urinary tract symptoms: Secondary | ICD-10-CM | POA: Diagnosis present

## 2021-07-07 DIAGNOSIS — Z515 Encounter for palliative care: Secondary | ICD-10-CM | POA: Diagnosis not present

## 2021-07-07 DIAGNOSIS — D696 Thrombocytopenia, unspecified: Secondary | ICD-10-CM | POA: Diagnosis present

## 2021-07-07 DIAGNOSIS — K7460 Unspecified cirrhosis of liver: Secondary | ICD-10-CM | POA: Diagnosis present

## 2021-07-07 DIAGNOSIS — Z7984 Long term (current) use of oral hypoglycemic drugs: Secondary | ICD-10-CM

## 2021-07-07 DIAGNOSIS — R9431 Abnormal electrocardiogram [ECG] [EKG]: Secondary | ICD-10-CM | POA: Diagnosis not present

## 2021-07-07 DIAGNOSIS — R0789 Other chest pain: Secondary | ICD-10-CM | POA: Diagnosis not present

## 2021-07-07 DIAGNOSIS — E1165 Type 2 diabetes mellitus with hyperglycemia: Secondary | ICD-10-CM | POA: Diagnosis present

## 2021-07-07 DIAGNOSIS — Z20822 Contact with and (suspected) exposure to covid-19: Secondary | ICD-10-CM | POA: Diagnosis present

## 2021-07-07 DIAGNOSIS — A4102 Sepsis due to Methicillin resistant Staphylococcus aureus: Secondary | ICD-10-CM | POA: Diagnosis present

## 2021-07-07 DIAGNOSIS — Z87891 Personal history of nicotine dependence: Secondary | ICD-10-CM

## 2021-07-07 DIAGNOSIS — R778 Other specified abnormalities of plasma proteins: Secondary | ICD-10-CM | POA: Diagnosis present

## 2021-07-07 DIAGNOSIS — Z85038 Personal history of other malignant neoplasm of large intestine: Secondary | ICD-10-CM

## 2021-07-07 DIAGNOSIS — N1411 Contrast-induced nephropathy: Secondary | ICD-10-CM | POA: Diagnosis present

## 2021-07-07 DIAGNOSIS — I4891 Unspecified atrial fibrillation: Secondary | ICD-10-CM | POA: Diagnosis present

## 2021-07-07 DIAGNOSIS — I129 Hypertensive chronic kidney disease with stage 1 through stage 4 chronic kidney disease, or unspecified chronic kidney disease: Secondary | ICD-10-CM | POA: Diagnosis present

## 2021-07-07 DIAGNOSIS — Z720 Tobacco use: Secondary | ICD-10-CM | POA: Diagnosis not present

## 2021-07-07 DIAGNOSIS — R6520 Severe sepsis without septic shock: Secondary | ICD-10-CM | POA: Diagnosis present

## 2021-07-07 DIAGNOSIS — N189 Chronic kidney disease, unspecified: Secondary | ICD-10-CM | POA: Diagnosis not present

## 2021-07-07 DIAGNOSIS — L210 Seborrhea capitis: Secondary | ICD-10-CM | POA: Diagnosis present

## 2021-07-07 DIAGNOSIS — E1122 Type 2 diabetes mellitus with diabetic chronic kidney disease: Secondary | ICD-10-CM | POA: Diagnosis present

## 2021-07-07 DIAGNOSIS — E871 Hypo-osmolality and hyponatremia: Secondary | ICD-10-CM | POA: Diagnosis present

## 2021-07-07 DIAGNOSIS — I248 Other forms of acute ischemic heart disease: Secondary | ICD-10-CM | POA: Diagnosis present

## 2021-07-07 DIAGNOSIS — R7401 Elevation of levels of liver transaminase levels: Secondary | ICD-10-CM | POA: Diagnosis not present

## 2021-07-07 DIAGNOSIS — I959 Hypotension, unspecified: Secondary | ICD-10-CM | POA: Diagnosis present

## 2021-07-07 DIAGNOSIS — Z794 Long term (current) use of insulin: Secondary | ICD-10-CM

## 2021-07-07 DIAGNOSIS — Z96652 Presence of left artificial knee joint: Secondary | ICD-10-CM | POA: Diagnosis present

## 2021-07-07 DIAGNOSIS — N179 Acute kidney failure, unspecified: Secondary | ICD-10-CM | POA: Diagnosis present

## 2021-07-07 DIAGNOSIS — K219 Gastro-esophageal reflux disease without esophagitis: Secondary | ICD-10-CM | POA: Diagnosis present

## 2021-07-07 DIAGNOSIS — Z9221 Personal history of antineoplastic chemotherapy: Secondary | ICD-10-CM

## 2021-07-07 DIAGNOSIS — C7800 Secondary malignant neoplasm of unspecified lung: Secondary | ICD-10-CM | POA: Diagnosis present

## 2021-07-07 DIAGNOSIS — Z7189 Other specified counseling: Secondary | ICD-10-CM | POA: Diagnosis not present

## 2021-07-07 DIAGNOSIS — Y828 Other medical devices associated with adverse incidents: Secondary | ICD-10-CM | POA: Diagnosis present

## 2021-07-07 DIAGNOSIS — Z9049 Acquired absence of other specified parts of digestive tract: Secondary | ICD-10-CM

## 2021-07-07 DIAGNOSIS — T80211A Bloodstream infection due to central venous catheter, initial encounter: Principal | ICD-10-CM | POA: Diagnosis present

## 2021-07-07 DIAGNOSIS — T508X5A Adverse effect of diagnostic agents, initial encounter: Secondary | ICD-10-CM | POA: Diagnosis present

## 2021-07-07 DIAGNOSIS — A419 Sepsis, unspecified organism: Secondary | ICD-10-CM | POA: Diagnosis present

## 2021-07-07 DIAGNOSIS — Z79899 Other long term (current) drug therapy: Secondary | ICD-10-CM

## 2021-07-07 DIAGNOSIS — R079 Chest pain, unspecified: Secondary | ICD-10-CM | POA: Diagnosis not present

## 2021-07-07 DIAGNOSIS — C189 Malignant neoplasm of colon, unspecified: Secondary | ICD-10-CM | POA: Diagnosis not present

## 2021-07-07 DIAGNOSIS — Z7901 Long term (current) use of anticoagulants: Secondary | ICD-10-CM

## 2021-07-07 HISTORY — DX: Malignant neoplasm of unspecified part of unspecified bronchus or lung: C34.90

## 2021-07-07 LAB — BLOOD CULTURE ID PANEL (REFLEXED) - BCID2

## 2021-07-07 LAB — CBC WITH DIFFERENTIAL/PLATELET
Abs Immature Granulocytes: 0.08 10*3/uL — ABNORMAL HIGH (ref 0.00–0.07)
Basophils Absolute: 0 10*3/uL (ref 0.0–0.1)
Basophils Relative: 0 %
Eosinophils Absolute: 0 10*3/uL (ref 0.0–0.5)
Eosinophils Relative: 0 %
HCT: 27 % — ABNORMAL LOW (ref 39.0–52.0)
Hemoglobin: 9.4 g/dL — ABNORMAL LOW (ref 13.0–17.0)
Immature Granulocytes: 1 %
Lymphocytes Relative: 3 %
Lymphs Abs: 0.4 10*3/uL — ABNORMAL LOW (ref 0.7–4.0)
MCH: 34.2 pg — ABNORMAL HIGH (ref 26.0–34.0)
MCHC: 34.8 g/dL (ref 30.0–36.0)
MCV: 98.2 fL (ref 80.0–100.0)
Monocytes Absolute: 1.1 10*3/uL — ABNORMAL HIGH (ref 0.1–1.0)
Monocytes Relative: 10 %
Neutro Abs: 9.4 10*3/uL — ABNORMAL HIGH (ref 1.7–7.7)
Neutrophils Relative %: 86 %
Platelets: 79 10*3/uL — ABNORMAL LOW (ref 150–400)
RBC: 2.75 MIL/uL — ABNORMAL LOW (ref 4.22–5.81)
RDW: 20.1 % — ABNORMAL HIGH (ref 11.5–15.5)
WBC: 10.9 10*3/uL — ABNORMAL HIGH (ref 4.0–10.5)
nRBC: 0 % (ref 0.0–0.2)

## 2021-07-07 LAB — MAGNESIUM: Magnesium: 1.5 mg/dL — ABNORMAL LOW (ref 1.7–2.4)

## 2021-07-07 LAB — RESP PANEL BY RT-PCR (FLU A&B, COVID) ARPGX2
Influenza A by PCR: NEGATIVE
Influenza B by PCR: NEGATIVE
SARS Coronavirus 2 by RT PCR: NEGATIVE

## 2021-07-07 LAB — APTT
aPTT: 41 seconds — ABNORMAL HIGH (ref 24–36)
aPTT: 103 seconds — ABNORMAL HIGH (ref 24–36)

## 2021-07-07 LAB — ECHOCARDIOGRAM COMPLETE
AR max vel: 1.97 cm2
AV Area VTI: 2.02 cm2
AV Area mean vel: 1.9 cm2
AV Mean grad: 4 mmHg
AV Peak grad: 8 mmHg
Ao pk vel: 1.41 m/s
Area-P 1/2: 7.74 cm2
Calc EF: 57.7 %
MV M vel: 4.43 m/s
MV Peak grad: 78.5 mmHg
S' Lateral: 3.3 cm
Single Plane A2C EF: 50.8 %
Single Plane A4C EF: 64.3 %
Weight: 1703.71 oz

## 2021-07-07 LAB — COMPREHENSIVE METABOLIC PANEL
ALT: 33 U/L (ref 0–44)
AST: 92 U/L — ABNORMAL HIGH (ref 15–41)
Albumin: 2.6 g/dL — ABNORMAL LOW (ref 3.5–5.0)
Alkaline Phosphatase: 80 U/L (ref 38–126)
Anion gap: 10 (ref 5–15)
BUN: 36 mg/dL — ABNORMAL HIGH (ref 8–23)
CO2: 20 mmol/L — ABNORMAL LOW (ref 22–32)
Calcium: 8 mg/dL — ABNORMAL LOW (ref 8.9–10.3)
Chloride: 99 mmol/L (ref 98–111)
Creatinine, Ser: 2.43 mg/dL — ABNORMAL HIGH (ref 0.61–1.24)
GFR, Estimated: 26 mL/min — ABNORMAL LOW (ref >60)
Glucose, Bld: 215 mg/dL — ABNORMAL HIGH (ref 70–99)
Potassium: 4 mmol/L (ref 3.5–5.1)
Sodium: 129 mmol/L — ABNORMAL LOW (ref 135–145)
Total Bilirubin: 2 mg/dL — ABNORMAL HIGH (ref 0.3–1.2)
Total Protein: 6.1 g/dL — ABNORMAL LOW (ref 6.5–8.1)

## 2021-07-07 LAB — URINALYSIS, ROUTINE W REFLEX MICROSCOPIC
Bilirubin Urine: NEGATIVE
Glucose, UA: NEGATIVE mg/dL
Hgb urine dipstick: NEGATIVE
Ketones, ur: NEGATIVE mg/dL
Leukocytes,Ua: NEGATIVE
Nitrite: NEGATIVE
Protein, ur: NEGATIVE mg/dL
Specific Gravity, Urine: 1.04 — ABNORMAL HIGH (ref 1.005–1.030)
pH: 5 (ref 5.0–8.0)

## 2021-07-07 LAB — TROPONIN I (HIGH SENSITIVITY)
Troponin I (High Sensitivity): 8353 ng/L (ref <18)
Troponin I (High Sensitivity): 7977 ng/L (ref <18)
Troponin I (High Sensitivity): 10105 ng/L (ref <18)

## 2021-07-07 LAB — HEPARIN LEVEL (UNFRACTIONATED): Heparin Unfractionated: >1.1 IU/mL — ABNORMAL HIGH (ref 0.30–0.70)

## 2021-07-07 LAB — HEMOGLOBIN A1C
Hgb A1c MFr Bld: 7.5 % — ABNORMAL HIGH (ref 4.8–5.6)
Mean Plasma Glucose: 168.55 mg/dL

## 2021-07-07 LAB — CBG MONITORING, ED
Glucose-Capillary: 185 mg/dL — ABNORMAL HIGH (ref 70–99)
Glucose-Capillary: 226 mg/dL — ABNORMAL HIGH (ref 70–99)

## 2021-07-07 LAB — PROTIME-INR
INR: 2.3 — ABNORMAL HIGH (ref 0.8–1.2)
Prothrombin Time: 24.7 seconds — ABNORMAL HIGH (ref 11.4–15.2)

## 2021-07-07 LAB — GLUCOSE, CAPILLARY
Glucose-Capillary: 179 mg/dL — ABNORMAL HIGH (ref 70–99)
Glucose-Capillary: 138 mg/dL — ABNORMAL HIGH (ref 70–99)

## 2021-07-07 LAB — SODIUM, URINE, RANDOM: Sodium, Ur: 15 mmol/L

## 2021-07-07 LAB — CK: Total CK: 531 U/L — ABNORMAL HIGH (ref 49–397)

## 2021-07-07 LAB — LACTIC ACID, PLASMA
Lactic Acid, Venous: 2 mmol/L (ref 0.5–1.9)
Lactic Acid, Venous: 3 mmol/L (ref 0.5–1.9)
Lactic Acid, Venous: 2.6 mmol/L (ref 0.5–1.9)
Lactic Acid, Venous: 2.8 mmol/L (ref 0.5–1.9)
Lactic Acid, Venous: 3.3 mmol/L (ref 0.5–1.9)
Lactic Acid, Venous: 1.7 mmol/L (ref 0.5–1.9)

## 2021-07-07 LAB — URIC ACID: Uric Acid, Serum: 7.5 mg/dL (ref 3.7–8.6)

## 2021-07-07 LAB — CREATININE, URINE, RANDOM: Creatinine, Urine: 131.77 mg/dL

## 2021-07-07 LAB — PROCALCITONIN: Procalcitonin: 27.09 ng/mL

## 2021-07-07 IMAGING — US US ABDOMEN LIMITED
1 series · 14 of 25 positions shown · non-contrast
Comparison: CT [DATE]

CLINICAL DATA: Evaluate for choledocholithiasis.

EXAM:
ULTRASOUND ABDOMEN LIMITED RIGHT UPPER QUADRANT

[Series 1: us abdomen limited ruq (liver/gb) · 38 acquisitions, 14 frames shown]
[im 1/38]
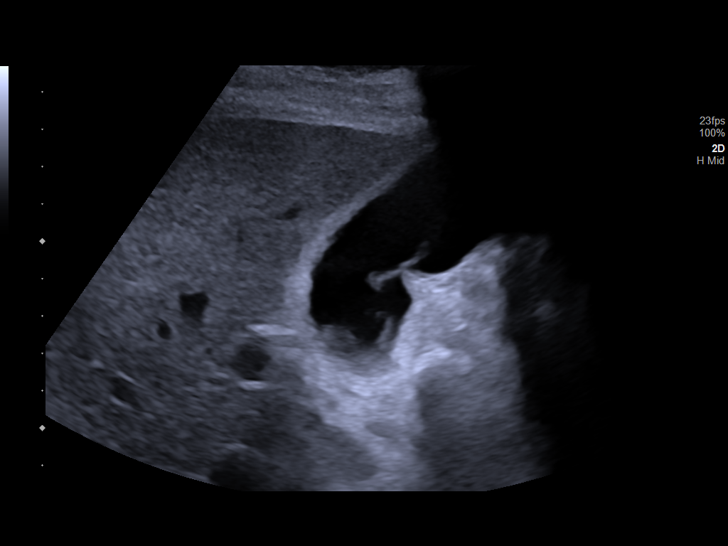
[im 4/38]
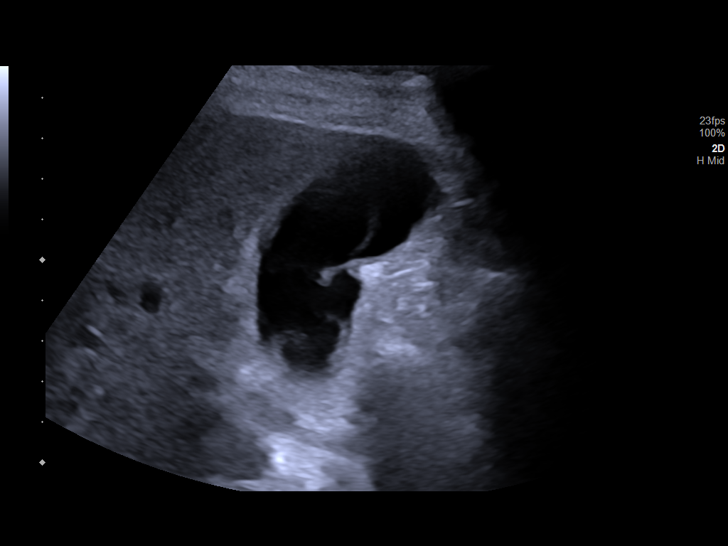
[im 7/38]
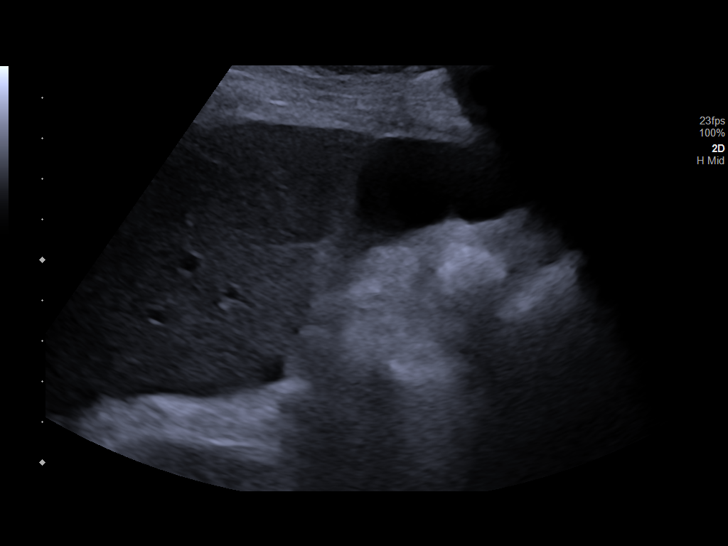
[im 10/38]
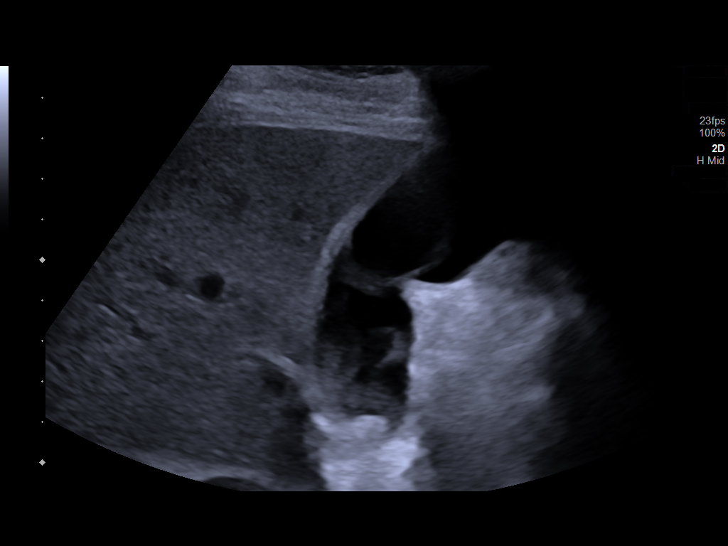
[im 13/38]
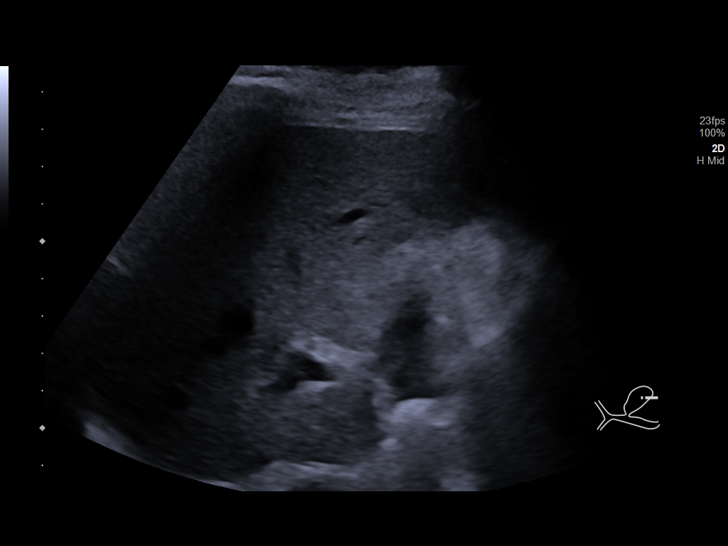
[im 14/38]
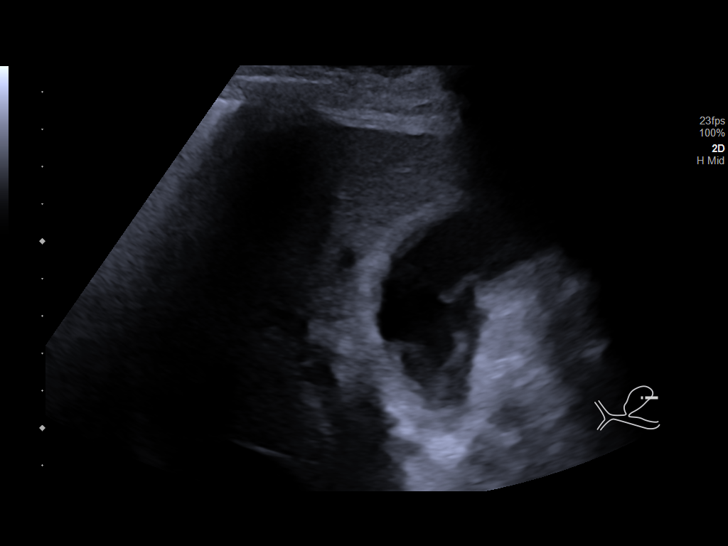
[im 17/38]
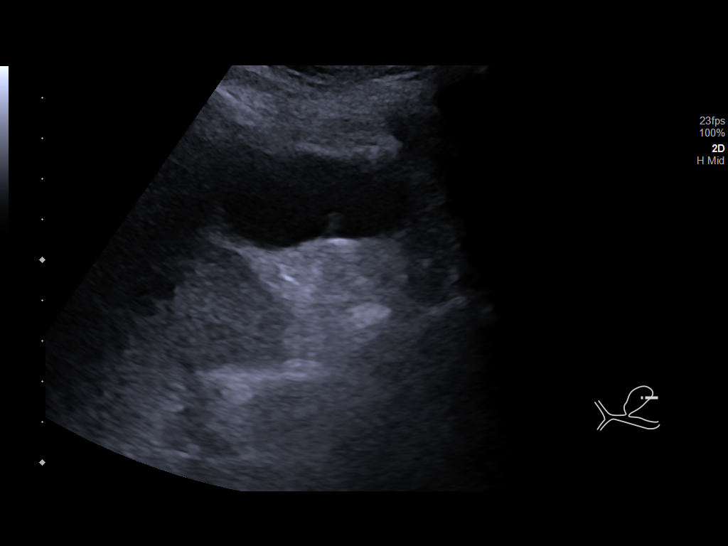
[im 21/38]
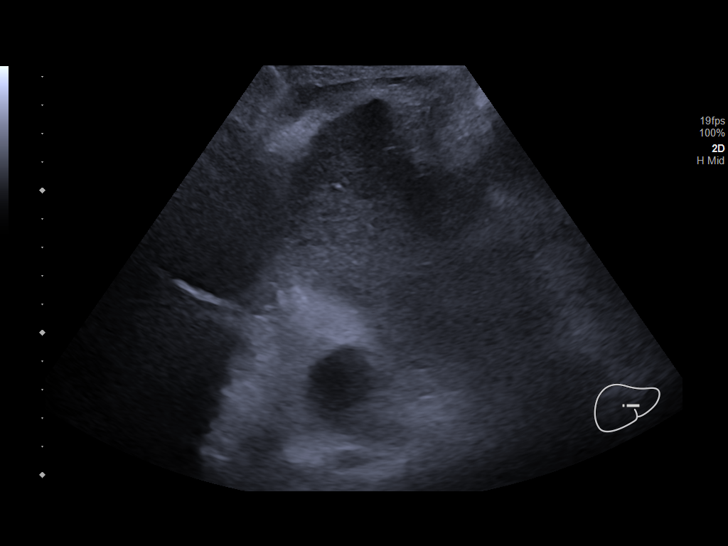
[im 24/38]
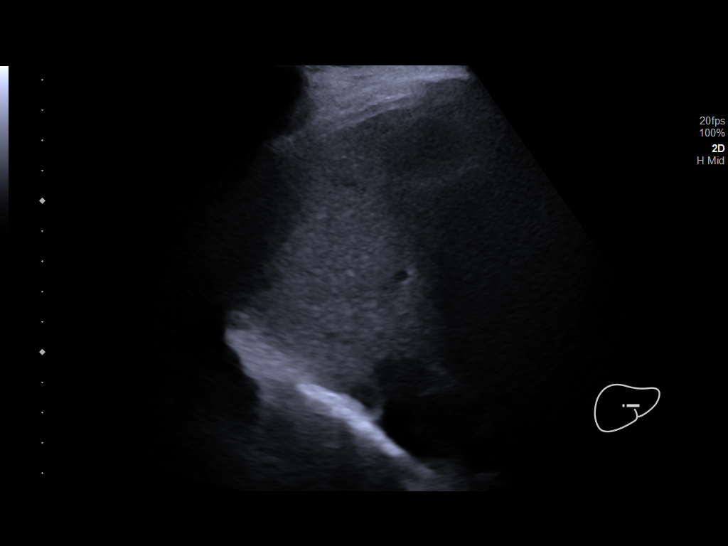
[im 25/38]
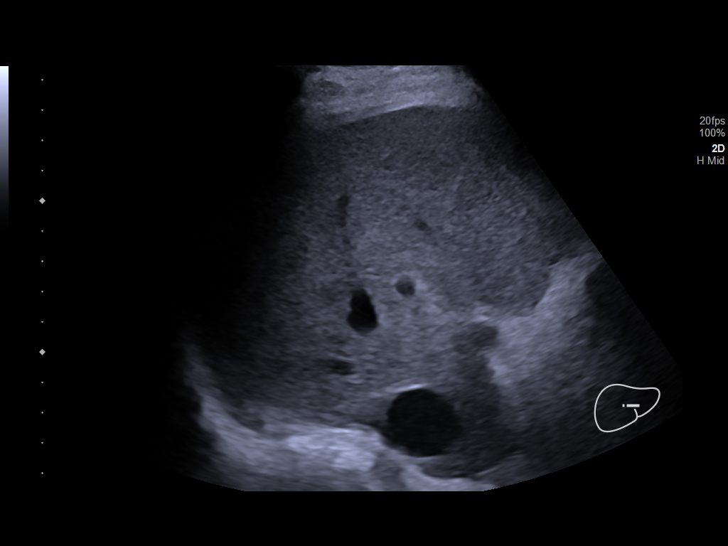
[im 28/38]
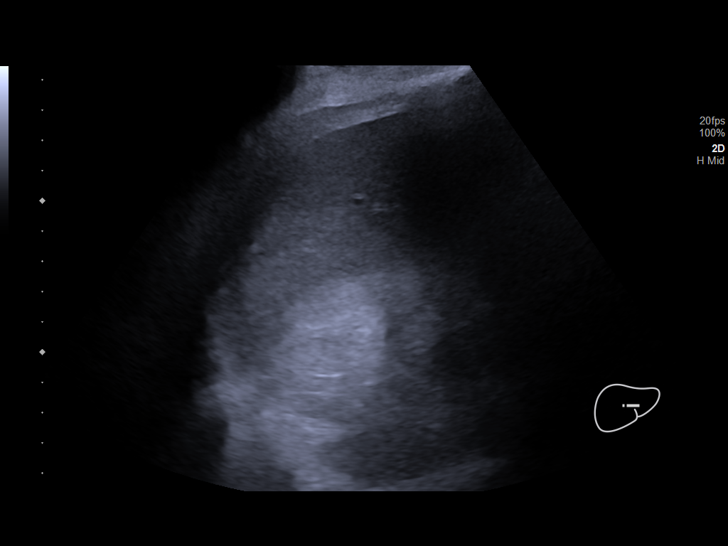
[im 31/38]
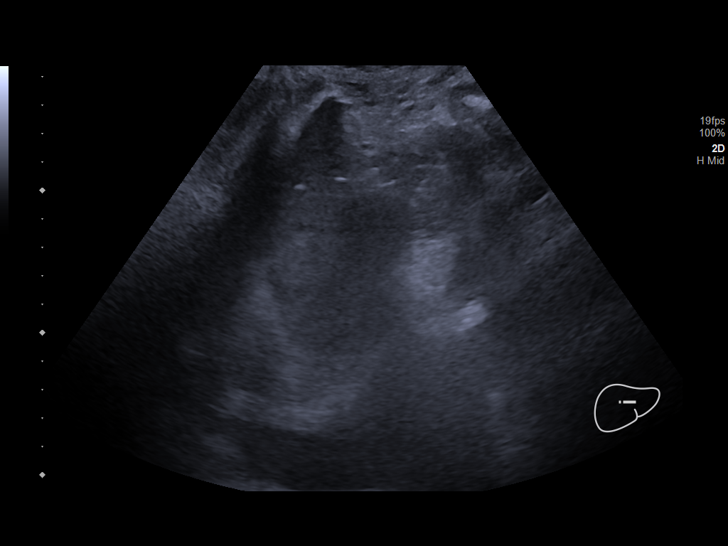
[im 34/38]
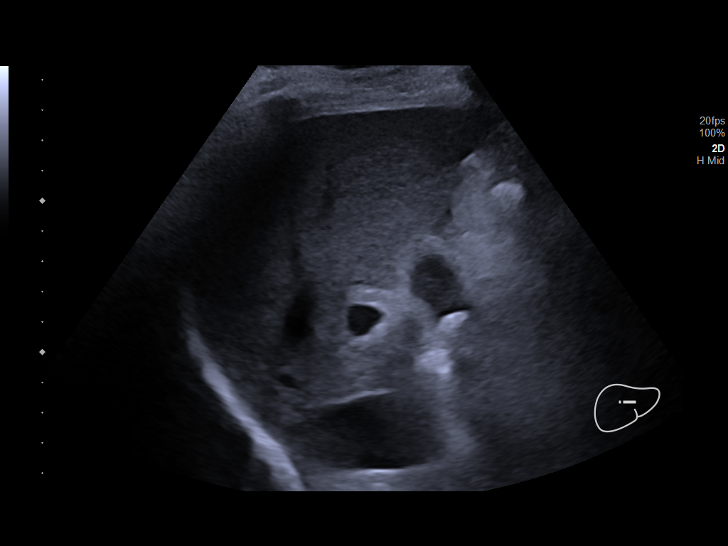
[im 38/38]
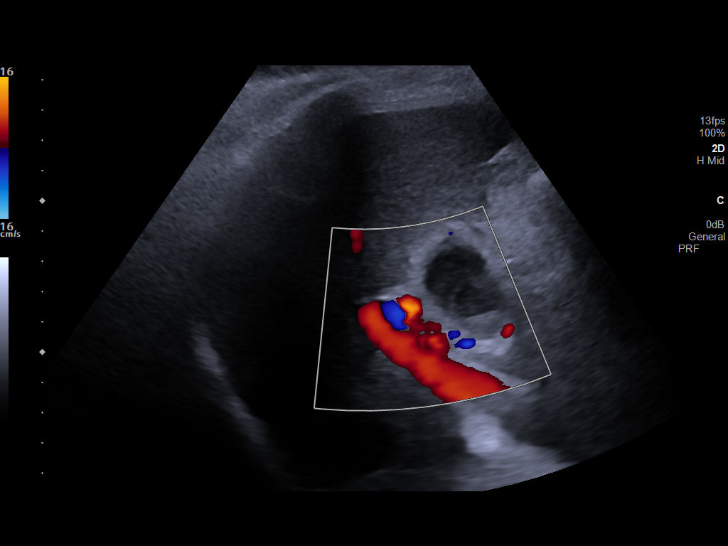

[14 of 25 positions shown; findings below may reference images not displayed]

FINDINGS: Gallbladder:

Large stone within the gallbladder measures 1.4 cm. Debris/sludge
noted within the gallbladder. Mild gallbladder wall thickening
measures 4.4 mm. Negative sonographic Murphy's sign.

Common bile duct:

Diameter: 5 mm

Liver:

Increased parenchymal echogenicity. Portal vein is patent on color
Doppler imaging with normal direction of blood flow towards the
liver.

Other: None.
IMPRESSION: 1. Gallstone and sludge noted within gallbladder. Mild gallbladder
wall thickening. Note: In the setting of cirrhosis (as suggested on
CT from earlier today), gallbladder wall thickening may be a
nonspecific finding. If there is a high suspicion for acute
cholecystitis consider further investigation with nuclear medicine
hepatic biliary scan.
2. Increased parenchymal echogenicity suggestive of hepatic
steatosis.
3. No biliary ductal dilatation identified.

## 2021-07-07 IMAGING — CT CT CHEST-ABD-PELV W/O CM
2 of 5 series · 15 of 46 positions shown, 17 images · non-contrast
Comparison: None Available.

CLINICAL DATA: Chest wall pain, nontraumatic, with infection or
inflammation suspected. Fever



[Series 5: cap w/o 2.0 mm (person_name) (person_name) · axial · non-contrast · 0.85mm/px · z∈[+783,+1339]mm · 12 of 316 slices shown, 14 images]
[im 19/316  soft-tissue]
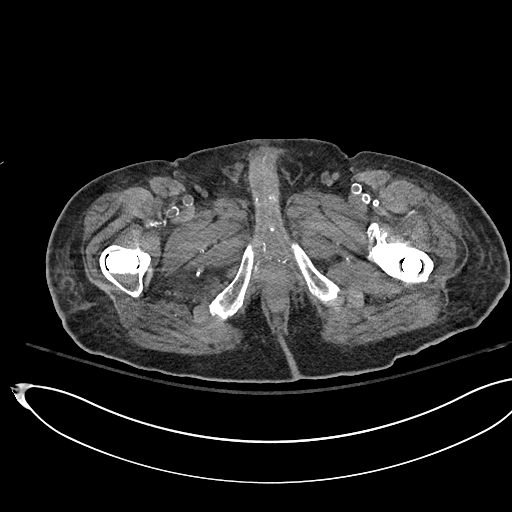
[im 19/316  bone]
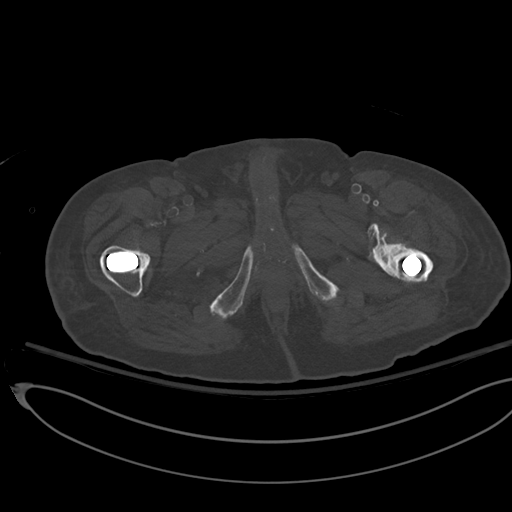
[im 56/316  soft-tissue]
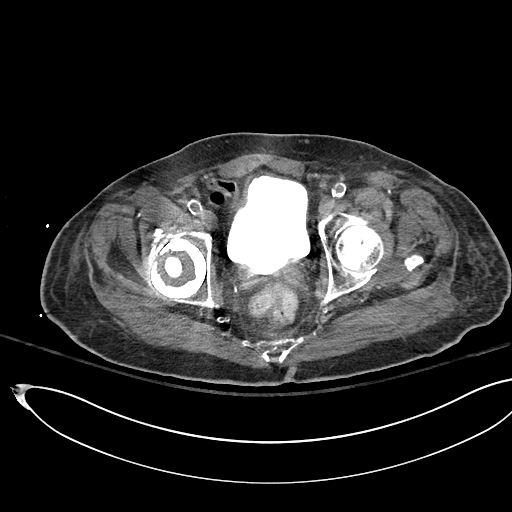
[im 75/316  soft-tissue]
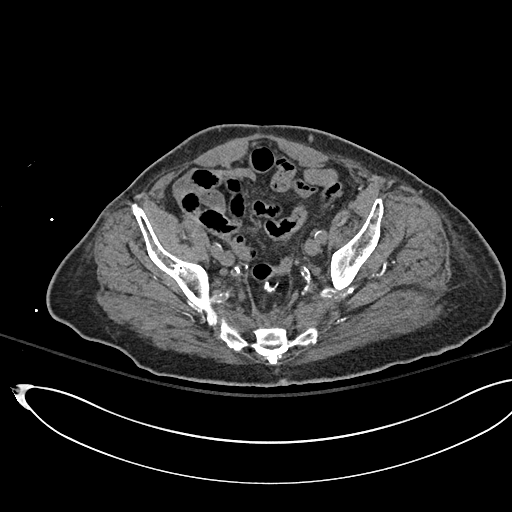
[im 93/316  soft-tissue]
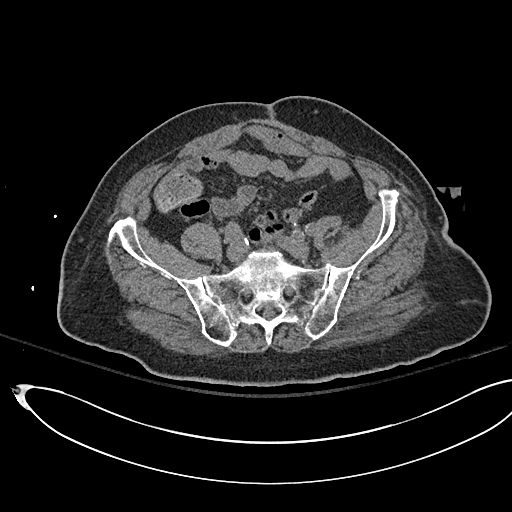
[im 130/316  soft-tissue]
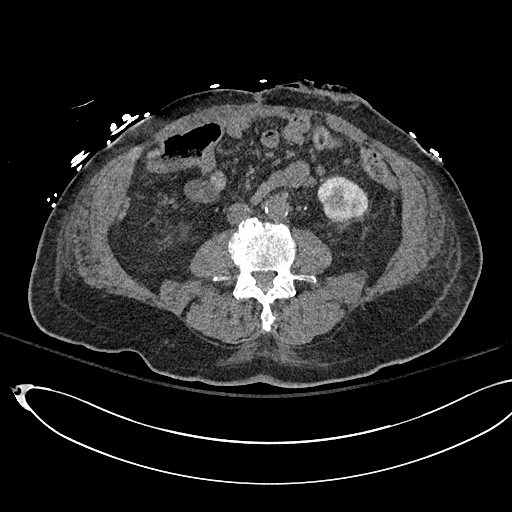
[im 149/316  soft-tissue]
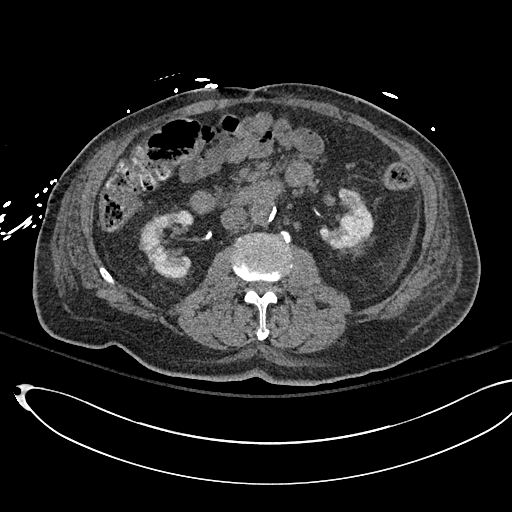
[im 167/316  soft-tissue]
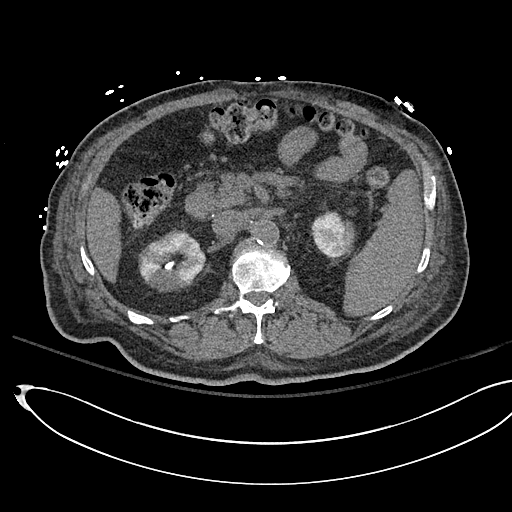
[im 204/316  soft-tissue]
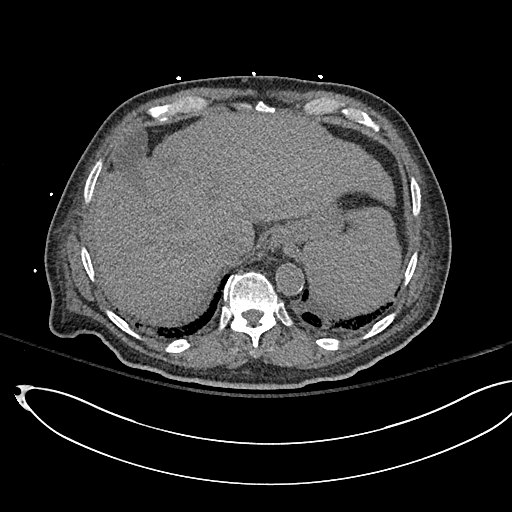
[im 223/316  soft-tissue]
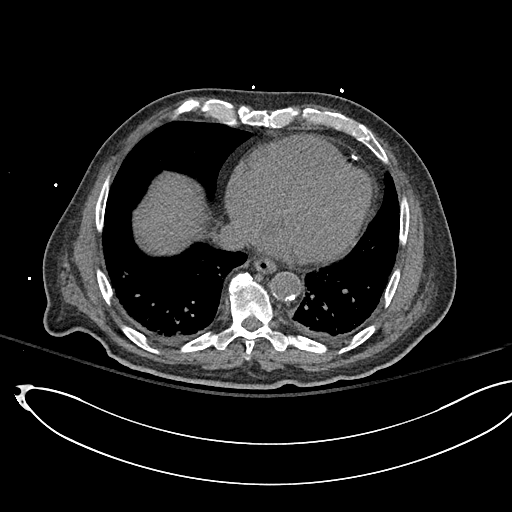
[im 223/316  bone]
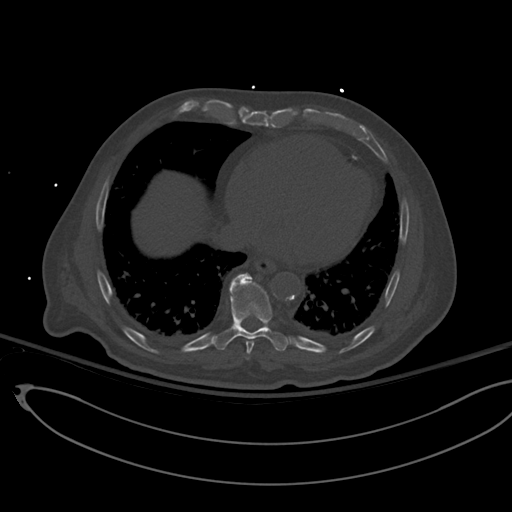
[im 241/316  soft-tissue]
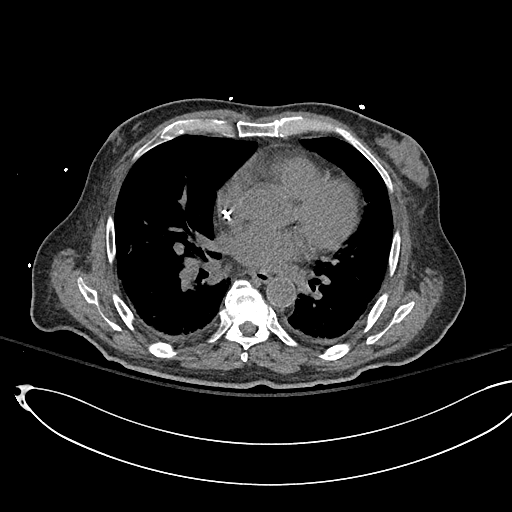
[im 278/316  soft-tissue]
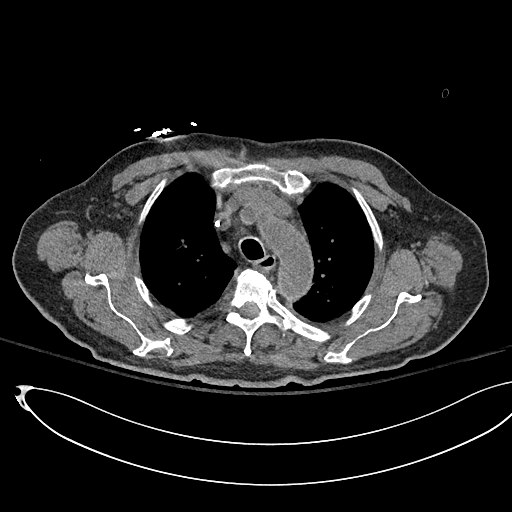
[im 297/316  soft-tissue]
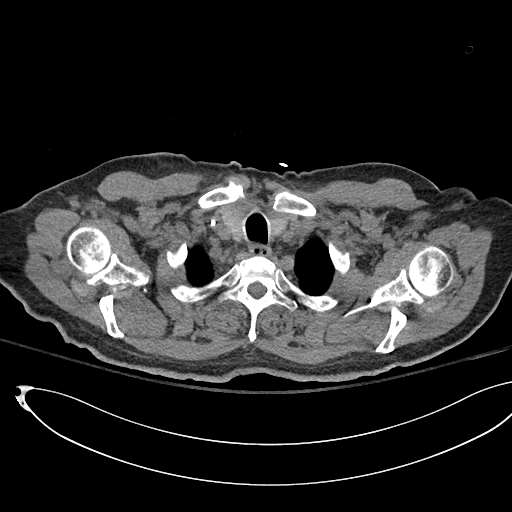

[Series 7: cap w/o 3.0 mm st cor · coronal · non-contrast · 0.84mm/px · 3 of 105 slices shown]
[im 35/105  soft-tissue]
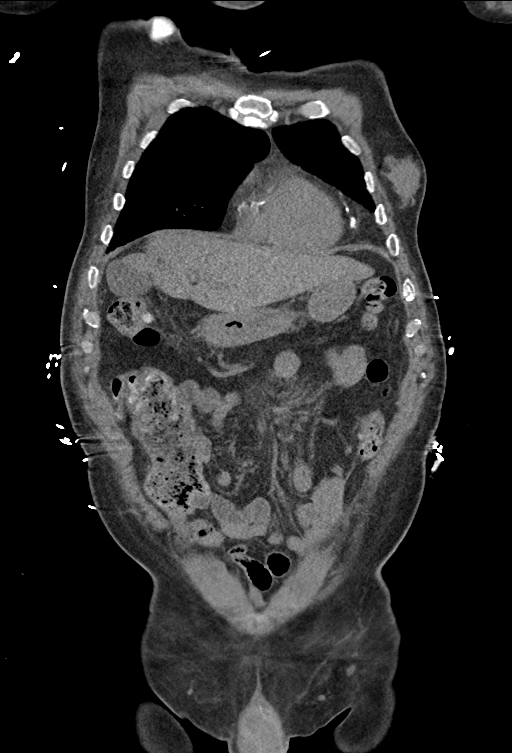
[im 47/105  soft-tissue]
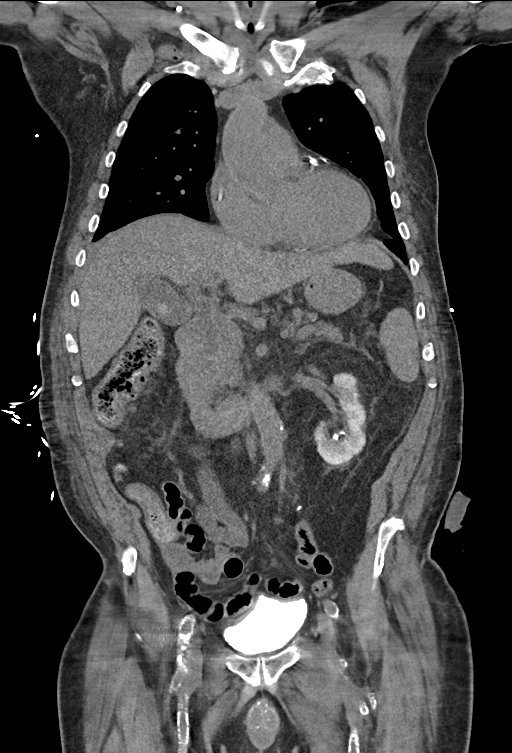
[im 58/105  soft-tissue]
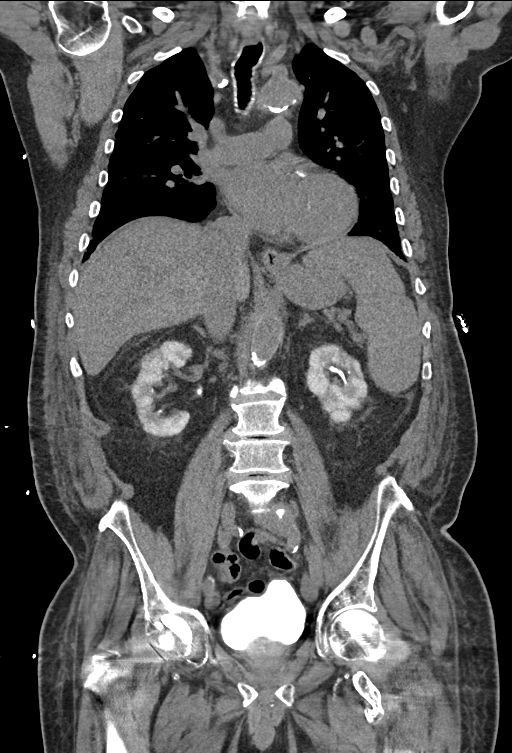

[15 of 46 positions shown; findings below may reference images not displayed]

FINDINGS: CT CHEST FINDINGS

Cardiovascular: Normal heart size. No pericardial effusion.
Extensive atheromatous calcification of the aorta and coronaries.
Right-sided porta catheter with tip at the upper cavoatrial
junction.

Mediastinum/Nodes: Negative for mass or adenopathy

Lungs/Pleura: Dependent atelectasis. There is no edema,
consolidation, effusion, or pneumothorax.

Musculoskeletal: No acute finding.

Gynecomastia.

CT ABDOMEN PELVIS FINDINGS

Hepatobiliary: Lobulated liver with large caudate lobe and fissures.
Cholelithiasis without findings of acute cholecystitis.

Pancreas: Generalized atrophy.

Spleen: Mildly enlarged with 14 cm craniocaudal span.

Adrenals/Urinary Tract: 2.8 cm right adrenal mass. Heterogeneity of
renal cortex with patchy high-density areas bilaterally. Contrast is
being excreted from unknown prior study. Negative bladder.

Stomach/Bowel: Descending colostomy. Negative for bowel obstruction
or visible inflammation.

Vascular/Lymphatic: Atheromatous calcification of the aorta and
branch vessels.

Reproductive: No acute finding

Other: No ascites or pneumoperitoneum

Musculoskeletal: Right hip replacement and left femoral fixation.
Chronic AVN of the left femoral head. Lumbar spine degeneration with
mild L4-5 anterolisthesis.
IMPRESSION: 1. No acute finding.
2. 2.8 cm indeterminate right adrenal mass. History of lung cancer,
recommend follow-up with prior staging scans.
3. Recent contrast administration from unknown procedure. Patchy
renal cortical density could be from ATN or scarring.
4. Cirrhotic appearance of the liver. Please correlate for risk
factors.
5.  Aortic Atherosclerosis ([LK]-[LK]).
6. Cholelithiasis.

## 2021-07-07 IMAGING — DX DG CHEST 1V PORT
1 series · 1 of 1 positions shown · non-contrast
Comparison: [DATE].

CLINICAL DATA: Questionable sepsis.

EXAM:
PORTABLE CHEST 1 VIEW

[chest ap]
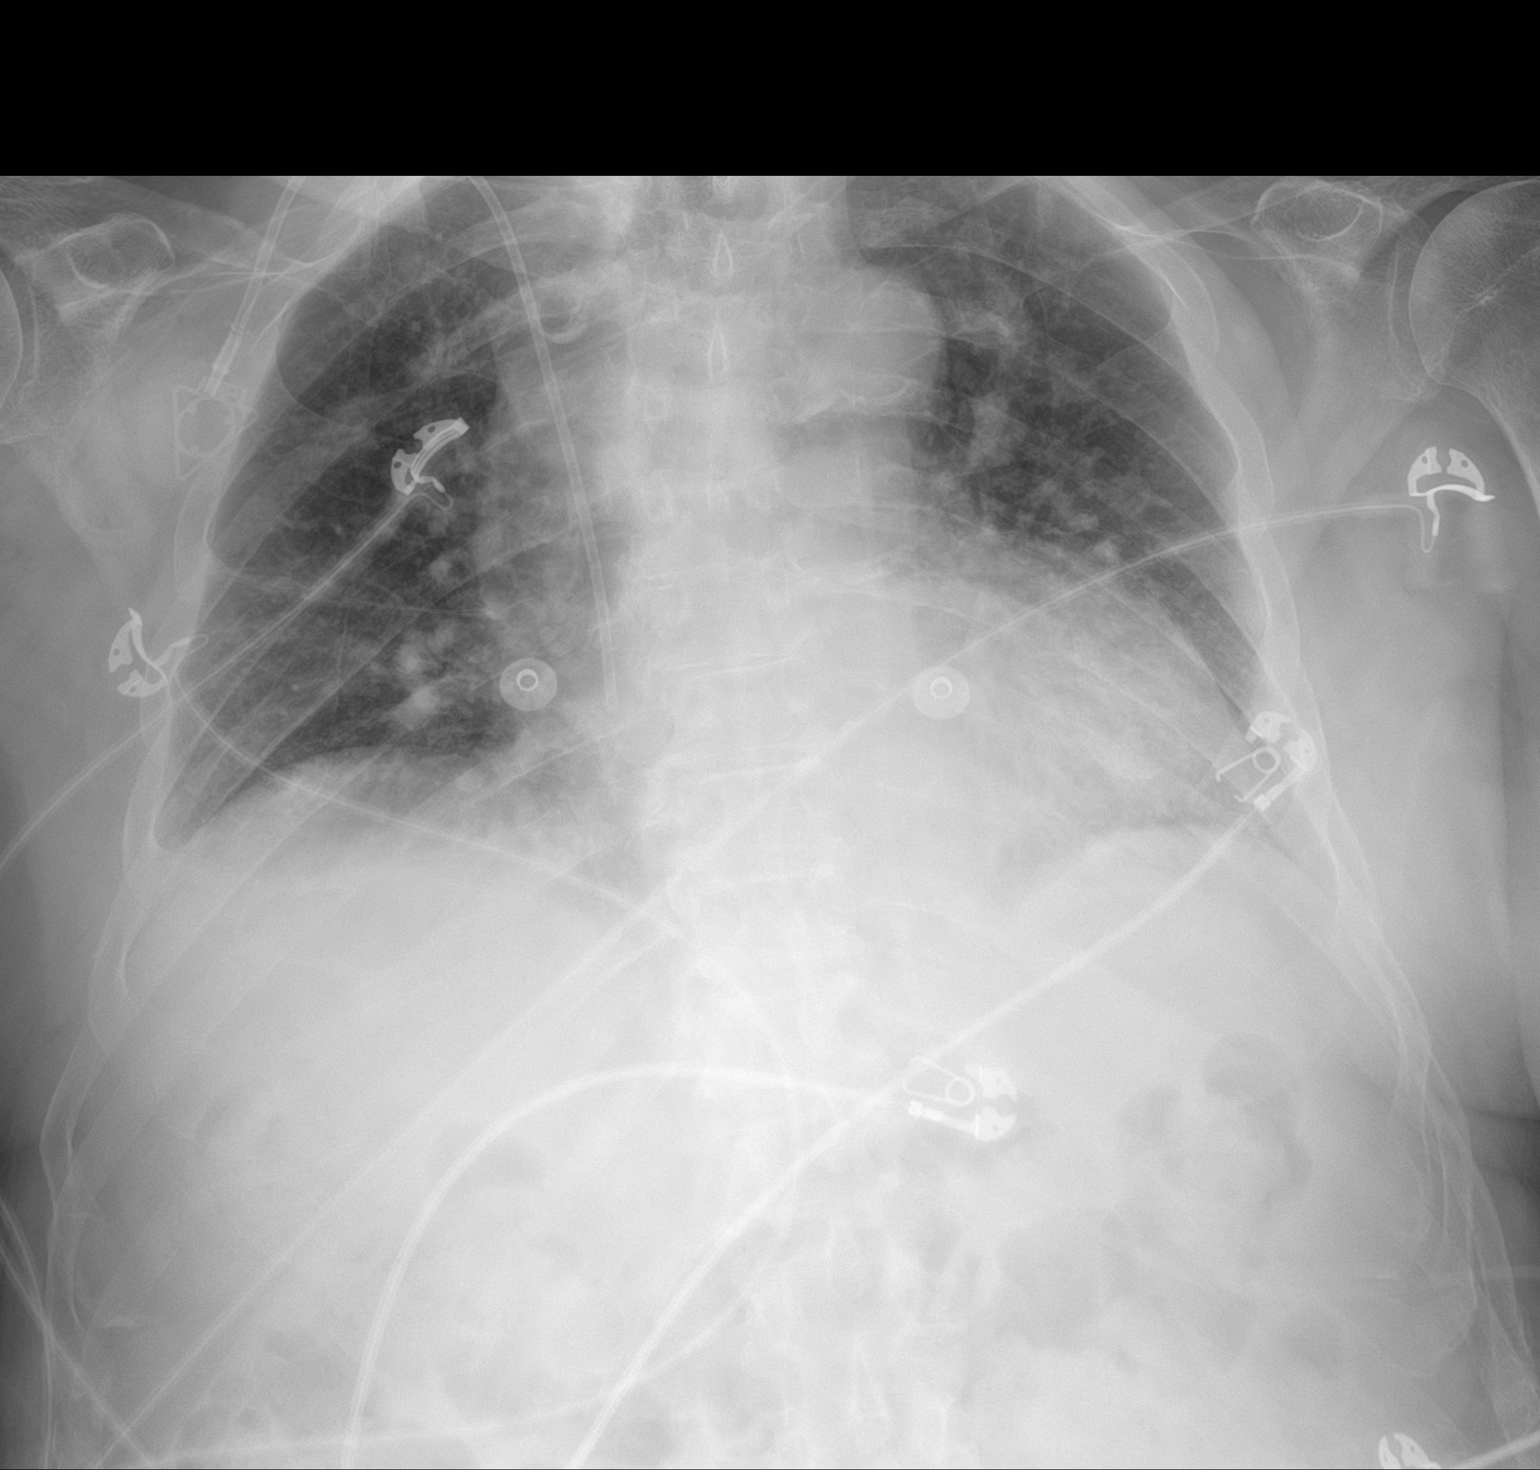

[1 of 1 positions shown; findings below may reference images not displayed]

FINDINGS: The heart is enlarged and the mediastinal contours within normal
limits. Atherosclerotic calcification of the aorta is noted. The
distal tip of right chest port terminates at the cavoatrial
junction. Lung volumes are low. Mild atelectasis is noted at the
lung bases bilaterally. There is a trace right pleural effusion. No
pneumothorax. No acute osseous abnormality.
IMPRESSION: 1. Low lung volumes with mild atelectasis at the lung bases.
2. Trace right pleural effusion.
3. Cardiomegaly.

## 2021-07-07 SURGERY — CORONARY/GRAFT ACUTE MI REVASCULARIZATION
Anesthesia: LOCAL

## 2021-07-07 MED ORDER — TAMSULOSIN HCL 0.4 MG PO CAPS
0.4000 mg | ORAL_CAPSULE | Freq: Every day | ORAL | Status: DC
Start: 2021-07-07 — End: 2021-07-10
  Administered 2021-07-07 – 2021-07-09 (×3): 0.4 mg via ORAL
  Filled 2021-07-07 (×3): qty 1

## 2021-07-07 MED ORDER — LIDOCAINE HCL (PF) 1 % IJ SOLN
INTRAMUSCULAR | Status: AC
  Filled 2021-07-07: qty 30

## 2021-07-07 MED ORDER — METRONIDAZOLE 500 MG/100ML IV SOLN
500.0000 mg | Freq: Two times a day (BID) | INTRAVENOUS | Status: DC
Start: 2021-07-07 — End: 2021-07-08
  Administered 2021-07-07 – 2021-07-08 (×2): 500 mg via INTRAVENOUS
  Filled 2021-07-07 (×3): qty 100

## 2021-07-07 MED ORDER — PANTOPRAZOLE SODIUM 40 MG PO TBEC
40.0000 mg | DELAYED_RELEASE_TABLET | Freq: Every day | ORAL | Status: DC
Start: 2021-07-07 — End: 2021-07-10
  Administered 2021-07-07 – 2021-07-09 (×3): 40 mg via ORAL
  Filled 2021-07-07 (×3): qty 1

## 2021-07-07 MED ORDER — FENTANYL CITRATE PF 50 MCG/ML IJ SOSY
50.0000 ug | PREFILLED_SYRINGE | Freq: Once | INTRAMUSCULAR | Status: AC
  Administered 2021-07-07: 50 ug via INTRAVENOUS
  Filled 2021-07-07: qty 1

## 2021-07-07 MED ORDER — DULOXETINE HCL 20 MG PO CPEP
20.0000 mg | ORAL_CAPSULE | Freq: Every day | ORAL | Status: DC
Start: 2021-07-07 — End: 2021-07-10
  Administered 2021-07-07 – 2021-07-09 (×3): 20 mg via ORAL
  Filled 2021-07-07 (×4): qty 1

## 2021-07-07 MED ORDER — HEPARIN SODIUM (PORCINE) 1000 UNIT/ML IJ SOLN
INTRAMUSCULAR | Status: AC
  Filled 2021-07-07: qty 10

## 2021-07-07 MED ORDER — SODIUM CHLORIDE 0.9 % IV SOLN
2.0000 g | INTRAVENOUS | Status: DC
  Administered 2021-07-08: 2 g via INTRAVENOUS
  Filled 2021-07-07: qty 12.5

## 2021-07-07 MED ORDER — SENNOSIDES-DOCUSATE SODIUM 8.6-50 MG PO TABS: 1.0000 | ORAL_TABLET | Freq: Every evening | ORAL | Status: DC | PRN

## 2021-07-07 MED ORDER — ATORVASTATIN CALCIUM 10 MG PO TABS
20.0000 mg | ORAL_TABLET | Freq: Every day | ORAL | Status: DC
  Administered 2021-07-07: 20 mg via ORAL
  Filled 2021-07-07: qty 2

## 2021-07-07 MED ORDER — INSULIN ASPART 100 UNIT/ML IJ SOLN
3.0000 [IU] | Freq: Three times a day (TID) | INTRAMUSCULAR | Status: DC
  Administered 2021-07-08: 3 [IU] via SUBCUTANEOUS

## 2021-07-07 MED ORDER — FERROUS SULFATE 325 (65 FE) MG PO TABS
325.0000 mg | ORAL_TABLET | Freq: Every day | ORAL | Status: DC
Start: 2021-07-07 — End: 2021-07-10
  Administered 2021-07-07 – 2021-07-09 (×3): 325 mg via ORAL
  Filled 2021-07-07 (×3): qty 1

## 2021-07-07 MED ORDER — VANCOMYCIN HCL IN DEXTROSE 1-5 GM/200ML-% IV SOLN: 1000.0000 mg | Freq: Once | INTRAVENOUS | Status: DC

## 2021-07-07 MED ORDER — SODIUM CHLORIDE 0.9 % IV SOLN
2.0000 g | Freq: Once | INTRAVENOUS | Status: AC
  Administered 2021-07-07: 2 g via INTRAVENOUS
  Filled 2021-07-07: qty 12.5

## 2021-07-07 MED ORDER — MELATONIN 3 MG PO TABS
3.0000 mg | ORAL_TABLET | Freq: Every day | ORAL | Status: DC
  Administered 2021-07-07 – 2021-07-09 (×3): 3 mg via ORAL
  Filled 2021-07-07 (×3): qty 1

## 2021-07-07 MED ORDER — HYDROCODONE-ACETAMINOPHEN 5-325 MG PO TABS
1.0000 | ORAL_TABLET | Freq: Four times a day (QID) | ORAL | Status: DC | PRN
  Administered 2021-07-07 – 2021-07-09 (×6): 2 via ORAL
  Administered 2021-07-10: 1 via ORAL
  Filled 2021-07-07 (×8): qty 2

## 2021-07-07 MED ORDER — VANCOMYCIN HCL 1250 MG/250ML IV SOLN
1250.0000 mg | Freq: Once | INTRAVENOUS | Status: AC
  Administered 2021-07-07: 1250 mg via INTRAVENOUS
  Filled 2021-07-07: qty 250

## 2021-07-07 MED ORDER — POLYETHYLENE GLYCOL 3350 17 G PO PACK
17.0000 g | PACK | Freq: Every day | ORAL | Status: DC
  Administered 2021-07-08 – 2021-07-09 (×2): 17 g via ORAL
  Filled 2021-07-07 (×3): qty 1

## 2021-07-07 MED ORDER — LACTATED RINGERS IV BOLUS (SEPSIS)
1000.0000 mL | Freq: Once | INTRAVENOUS | Status: AC
  Administered 2021-07-07: 1000 mL via INTRAVENOUS

## 2021-07-07 MED ORDER — ACETAMINOPHEN 325 MG PO TABS: 650.0000 mg | ORAL_TABLET | Freq: Four times a day (QID) | ORAL | Status: DC | PRN

## 2021-07-07 MED ORDER — MAGIC MOUTHWASH W/LIDOCAINE
5.0000 mL | Freq: Three times a day (TID) | ORAL | Status: DC | PRN
  Administered 2021-07-07: 5 mL via ORAL
  Filled 2021-07-07: qty 5

## 2021-07-07 MED ORDER — DOCUSATE SODIUM 100 MG PO CAPS
100.0000 mg | ORAL_CAPSULE | Freq: Two times a day (BID) | ORAL | Status: DC
Start: 2021-07-07 — End: 2021-07-10
  Administered 2021-07-07 – 2021-07-09 (×6): 100 mg via ORAL
  Filled 2021-07-07 (×6): qty 1

## 2021-07-07 MED ORDER — BISACODYL 10 MG RE SUPP: 10.0000 mg | Freq: Every day | RECTAL | Status: DC | PRN

## 2021-07-07 MED ORDER — VANCOMYCIN HCL 500 MG/100ML IV SOLN: 500.0000 mg | INTRAVENOUS | Status: DC

## 2021-07-07 MED ORDER — EMOLLIENT BASE EX CREA: 1.0000 "application " | TOPICAL_CREAM | Freq: Every day | CUTANEOUS | Status: DC | PRN

## 2021-07-07 MED ORDER — INSULIN ASPART 100 UNIT/ML IJ SOLN
0.0000 [IU] | Freq: Three times a day (TID) | INTRAMUSCULAR | Status: DC
  Administered 2021-07-07: 1 [IU] via SUBCUTANEOUS
  Administered 2021-07-07: 2 [IU] via SUBCUTANEOUS
  Administered 2021-07-07 – 2021-07-08 (×2): 1 [IU] via SUBCUTANEOUS

## 2021-07-07 MED ORDER — VERAPAMIL HCL 2.5 MG/ML IV SOLN
INTRAVENOUS | Status: AC
  Filled 2021-07-07: qty 2

## 2021-07-07 MED ORDER — ALBUTEROL SULFATE (2.5 MG/3ML) 0.083% IN NEBU: 2.5000 mg | INHALATION_SOLUTION | Freq: Four times a day (QID) | RESPIRATORY_TRACT | Status: DC | PRN

## 2021-07-07 MED ORDER — MENTHOL-ZINC OXIDE 0.44-20.6 % EX OINT: 1.0000 "application " | TOPICAL_OINTMENT | Freq: Three times a day (TID) | CUTANEOUS | Status: DC | PRN

## 2021-07-07 MED ORDER — METRONIDAZOLE 500 MG/100ML IV SOLN
500.0000 mg | Freq: Once | INTRAVENOUS | Status: AC
  Administered 2021-07-07: 500 mg via INTRAVENOUS
  Filled 2021-07-07: qty 100

## 2021-07-07 MED ORDER — LACTATED RINGERS IV BOLUS
500.0000 mL | INTRAVENOUS | Status: AC
  Administered 2021-07-07: 500 mL via INTRAVENOUS

## 2021-07-07 MED ORDER — LACTATED RINGERS IV SOLN: INTRAVENOUS | Status: DC

## 2021-07-07 MED ORDER — HEPARIN (PORCINE) 25000 UT/250ML-% IV SOLN
450.0000 [IU]/h | INTRAVENOUS | Status: DC
  Administered 2021-07-07: 600 [IU]/h via INTRAVENOUS
  Administered 2021-07-09: 450 [IU]/h via INTRAVENOUS
  Filled 2021-07-07 (×2): qty 250

## 2021-07-07 MED ORDER — SODIUM CHLORIDE 0.9% FLUSH
3.0000 mL | Freq: Two times a day (BID) | INTRAVENOUS | Status: DC
  Administered 2021-07-07 – 2021-07-10 (×5): 3 mL via INTRAVENOUS

## 2021-07-07 MED ORDER — ASCORBIC ACID 500 MG PO TABS
500.0000 mg | ORAL_TABLET | Freq: Every day | ORAL | Status: DC
  Administered 2021-07-07 – 2021-07-09 (×3): 500 mg via ORAL
  Filled 2021-07-07 (×3): qty 1

## 2021-07-07 MED ORDER — SODIUM CHLORIDE 0.9 % IV BOLUS
1000.0000 mL | Freq: Once | INTRAVENOUS | Status: AC
  Administered 2021-07-07: 1000 mL via INTRAVENOUS

## 2021-07-07 MED ORDER — HEPARIN (PORCINE) IN NACL 1000-0.9 UT/500ML-% IV SOLN
INTRAVENOUS | Status: AC
  Filled 2021-07-07: qty 1000

## 2021-07-07 MED ORDER — ONDANSETRON HCL 4 MG/2ML IJ SOLN: 4.0000 mg | Freq: Four times a day (QID) | INTRAMUSCULAR | Status: DC | PRN

## 2021-07-07 MED ORDER — ONDANSETRON HCL 4 MG PO TABS: 4.0000 mg | ORAL_TABLET | Freq: Four times a day (QID) | ORAL | Status: DC | PRN

## 2021-07-07 MED ORDER — ACETAMINOPHEN 650 MG RE SUPP: 650.0000 mg | Freq: Four times a day (QID) | RECTAL | Status: DC | PRN

## 2021-07-07 NOTE — Progress Notes (Signed)
ANTICOAGULATION CONSULT NOTE - Initial Consult  Pharmacy Consult for Heparin Indication: chest pain/ACS and atrial fibrillation  No Known Allergies  Patient Measurements: Weight: 48.3 kg (106 lb 7.7 oz)  Vital Signs: Temp: 102.1 F (38.9 C) (05/23 0434) Temp Source: Rectal (05/23 0434) BP: 99/56 (05/23 0445) Pulse Rate: 106 (05/23 0445)  Labs: Recent Labs    07/07/21 0347  HGB 9.4*  HCT 27.0*  PLT 79*  APTT 41*  LABPROT 24.7*  INR 2.3*    CrCl cannot be calculated (Patient's most recent lab result is older than the maximum 21 days allowed.).   Medical History: Past Medical History:  Diagnosis Date   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[place])    Diabetes mellitus without complication ([place])    Hypertension     Medications:  No current facility-administered medications on file prior to encounter.   Current Outpatient Medications on File Prior to Encounter  Medication Sig Dispense Refill   acetaminophen (TYLENOL) 325 MG tablet Take 650 mg by mouth every 6 (six) hours as needed.     apixaban (ELIQUIS) 5 MG TABS tablet Take 5 mg by mouth 2 (two) times daily.     atorvastatin (LIPITOR) 20 MG tablet Take 20 mg by mouth at bedtime.     bisacodyl (DULCOLAX) 10 MG suppository Place 1 suppository (10 mg total) rectally daily as needed for moderate constipation. 12 suppository 0   docusate sodium (COLACE) 100 MG capsule Take 1 capsule (100 mg total) by mouth 2 (two) times daily. 10 capsule 0   DULoxetine (CYMBALTA) 20 MG capsule Take 20 mg by mouth daily.     Ensure Max Protein (ENSURE MAX PROTEIN) LIQD Take 330 mLs (11 oz total) by mouth at bedtime.     feeding supplement, GLUCERNA SHAKE, (GLUCERNA SHAKE) LIQD Take 237 mLs by mouth 2 (two) times daily between meals.  0   HYDROcodone-acetaminophen (NORCO/VICODIN) 5-325 MG tablet Take 1-2 tablets by mouth every 6 (six) hours as needed for moderate pain. 30 tablet 0   insulin regular (NOVOLIN R) 100 units/mL injection Inject 3 Units into the skin 3  (three) times daily before meals.     Melatonin 3 MG CAPS Take 3 mg by mouth at bedtime as needed.     metFORMIN (GLUCOPHAGE) 500 MG tablet Take 1 tablet (500 mg total) by mouth daily with breakfast. 30 tablet 11   methocarbamol (ROBAXIN) 500 MG tablet Take 1 tablet (500 mg total) by mouth every 6 (six) hours as needed for muscle spasms. 30 tablet 1   Multiple Vitamins-Minerals (MULTIVITAMIN ADULT, MINERALS,) TABS Take 1 tablet by mouth daily. (Patient not taking: No sig reported)     omeprazole (PRILOSEC) 20 MG capsule Take 20 mg by mouth daily.     ondansetron (ZOFRAN) 4 MG tablet Take 1 tablet (4 mg total) by mouth every 6 (six) hours as needed for nausea. 20 tablet 0   polyethylene glycol powder (GLYCOLAX/MIRALAX) 17 GM/SCOOP powder Take 17 g by mouth daily. MIX 17GM (1 CAPFUL) BY MOUTH ONCE EVERY DAY MIX 17 GRAMS (USE BOTTLE CAP) IN LIQUID OF CHOICE AS DIRECTED TO PREVENT CONSTIPATION     senna-docusate (SENOKOT-S) 8.6-50 MG tablet Take 1 tablet by mouth at bedtime as needed for mild constipation.     tamsulosin (FLOMAX) 0.4 MG CAPS capsule Take 0.4 mg by mouth daily.       Assessment: 81 y.o. male admitted with chest pain, h/o Afib and Eliquis on hold, for heparin.  Last Eliquis given 5/22 at 5 pm  Goal of Therapy:  aPTT 66-102 sec Heparin level 0.3-0.7 units/ml Monitor platelets by anticoagulation protocol: Yes   Plan:  Start heparin 600 units/hr aPTT in 8 hours  Michelena Culmer, Bronson Curb 07/07/2021,4:49 AM

## 2021-07-07 NOTE — H&P (Signed)
History and Physical    Patient: Corey Gonzalez BJY:[id number] DOB: 01/13/1941 DOA: 07/07/2021 DOS: the patient was seen and examined on 07/07/2021 PCP: Wendie Agreste, NP  Patient coming from: [place] [facility]  Chief Complaint:  Chief Complaint  Patient presents with   Fever   HPI: Corey Gonzalez is a 81 y.o. 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  He is followed by oncology at the [facility] and had just recently started his fourth cycle of  capecitabine + bevacizumab on 5/18.  Following day patient reported acute onset of chills, malaise, generalized body aches(mostly upper back and across chest), nausea, vomiting, and some increased difficulty breathing.  He reports having a chronic productive cough with grayish sputum related to his history of smoking that he states seem to be less productive.  Denies coughing up any blood, significant leg swelling, abdominal pain, or change in ostomy output.  He reports that he has not had similar symptoms like this previously in the past after taking chemotherapy.  He had a CT abdomen and pelvis with contrast performed at the [facility] yesterday due to him symptoms that showed smaller pulmonary nodule since prior CT and PET/CT, no new growing suspicious lesions of the chest, abdomen, pelvis, and potential choledocholithiasis with mild increased biliary dilation.  Upon admission into the emergency department patient was seen as a code STEMI initially due to concern of ST elevations on initial EKG, but was later canceled after being evaluated by Dr. Burt Knack.  Patient noted to be febrile up to 102.1 F with heart rates 100-113, respirations  15-40, blood pressures 80/63 with improvement up to 105/73 after IV fluid boluses, and O2 saturations currently maintained on 2 L of oxygen.  Labs significant for WBC 10.9, hemoglobin 9.4, platelets 79 sodium 129, CO2 20, BUN 36, creatinine 2.43, glucose 215, calcium 8, albumin 2.6, AST 92, ALT 33, total bilirubin 2, high-sensitivity troponin 8353-> 7977, and lactic acid 2-> 3.  Chest x-ray noted low lung volumes with mild atelectasis at the lung bases, trace right pleural effusion, and cardiomegaly.  CT of the chest, abdomen, and pelvis did not note any acute findings concerning for patchy renal cortical density could be from ATN or scarring, and cholelithiasis.  Blood cultures have been obtained.  Patient was given full fluid bolus along with started on empiric antibiotics of vancomycin, metronidazole, and cefepime.  Cardiology have been formally consulted due to elevated troponin and started on heparin drip.  Case was discussed with PCCM who recommended aggressive fluid resuscitation and no need of critical care service indicated at this time his maps were greater than 65.  Review of Systems: As mentioned in the history of present illness. All other systems reviewed and are negative. Past Medical History:  Diagnosis Date   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[place])    Diabetes mellitus without complication ([place])    Hypertension    Lung cancer ([place])    Past Surgical History:  Procedure Laterality Date   COLOSTOMY     INTRAMEDULLARY (IM) NAIL INTERTROCHANTERIC Left 09/15/2019   Procedure: INTRAMEDULLARY (IM) NAIL INTERTROCHANTRIC;  Surgeon: Leim Fabry, MD;  Location: [facility] ORS;  Service: Orthopedics;  Laterality: Left;   JOINT REPLACEMENT     bilateral knees  JOINT REPLACEMENT Right    hip   ORIF FEMUR FRACTURE Left 12/31/2020   Procedure: OPEN REDUCTION INTERNAL FIXATION (ORIF) DISTAL FEMUR FRACTURE - PERIPROSTHETIC FRACTURE;  Surgeon: Hessie Knows, MD;  Location: [facility] ORS;  Service: Orthopedics;  Laterality: Left;    Social History:  reports that he has been smoking. He has never used smokeless tobacco. He reports that he does not currently use alcohol after a past usage of about 35.0 standard drinks per week. No history on file for drug use.  No Known Allergies  Family History  Problem Relation Age of Onset   Diabetes Other     Prior to Admission medications   Medication Sig Start Date End Date Taking? Authorizing Provider  acetaminophen (TYLENOL) 325 MG tablet Take 650 mg by mouth every 6 (six) hours as needed for mild pain. 03/20/18  Yes [provider]  ADMELOG 100 UNIT/ML injection Inject 3 Units into the skin in the morning, at noon, and at bedtime. 06/16/21  Yes [provider]  apixaban (ELIQUIS) 5 MG TABS tablet Take 5 mg by mouth 2 (two) times daily.   Yes [provider]  atorvastatin (LIPITOR) 20 MG tablet Take 20 mg by mouth at bedtime. 09/17/20  Yes [provider]  bisacodyl (DULCOLAX) 10 MG suppository Place 1 suppository (10 mg total) rectally daily as needed for moderate constipation. 09/18/19  Yes Georgette Shell, MD  capecitabine (XELODA) 500 MG tablet Take 1,500 mg by mouth at bedtime.   Yes [provider]  cholecalciferol (VITAMIN D3) 25 MCG (1000 UNIT) tablet Take 1,000 Units by mouth daily.   Yes [provider]  docusate sodium (COLACE) 100 MG capsule Take 1 capsule (100 mg total) by mouth 2 (two) times daily. 09/18/19  Yes Georgette Shell, MD  DULoxetine (CYMBALTA) 20 MG capsule Take 20 mg by mouth daily.   Yes [provider]  emollient (BIAFINE) cream Apply 1 application. topically daily as needed (dry skin). Arms chest back legs   Yes [provider]  ferrous sulfate 325 (65 FE) MG tablet Take 325 mg by mouth daily with breakfast.   Yes [provider]  HYDROcodone-acetaminophen (NORCO/VICODIN) 5-325 MG tablet Take 1-2 tablets by mouth every 6 (six) hours as needed for moderate pain. 01/02/21   Yes Duanne Guess, PA-C  Melatonin 2.5 MG CHEW Chew 2.5 mg by mouth at bedtime.   Yes [provider]  Menthol-Zinc Oxide (CALMOSEPTINE) 0.44-20.6 % OINT Apply 1 application. topically every 8 (eight) hours as needed (itching). Apply to buttocks   Yes [provider]  methocarbamol (ROBAXIN) 500 MG tablet Take 1 tablet (500 mg total) by mouth every 6 (six) hours as needed for muscle spasms. 01/05/21  Yes Annita Brod, MD  ondansetron (ZOFRAN-ODT) 8 MG disintegrating tablet Take 8 mg by mouth every 8 (eight) hours as needed for nausea or vomiting.   Yes [provider]  pantoprazole (PROTONIX) 40 MG tablet Take 40 mg by mouth daily.   Yes [provider]  polyethylene glycol powder (GLYCOLAX/MIRALAX) 17 GM/SCOOP powder Take 17 g by mouth daily. MIX 17GM (1 CAPFUL) BY MOUTH ONCE EVERY DAY MIX 17 GRAMS (USE BOTTLE CAP) IN LIQUID OF CHOICE AS DIRECTED TO PREVENT CONSTIPATION 09/17/20  Yes [provider]  prochlorperazine (COMPAZINE) 10 MG tablet Take 10 mg by mouth every 4 (four) hours as needed for nausea or vomiting.   Yes [provider]  senna-docusate (SENOKOT-S) 8.6-50 MG tablet Take 1 tablet  by mouth at bedtime as needed for mild constipation. 09/18/19  Yes Georgette Shell, MD  tamsulosin (FLOMAX) 0.4 MG CAPS capsule Take 0.4 mg by mouth daily.   Yes [provider]  vitamin C (ASCORBIC ACID) 500 MG tablet Take 500 mg by mouth daily.   Yes [provider]  Ensure Max Protein (ENSURE MAX PROTEIN) LIQD Take 330 mLs (11 oz total) by mouth at bedtime. Patient not taking: Reported on 07/07/2021 01/05/21   Annita Brod, MD  feeding supplement, GLUCERNA SHAKE, (GLUCERNA SHAKE) LIQD Take 237 mLs by mouth 2 (two) times daily between meals. Patient not taking: Reported on 07/07/2021 01/05/21   Annita Brod, MD  metFORMIN (GLUCOPHAGE) 500 MG tablet Take 1 tablet (500 mg total) by mouth daily with breakfast. 09/18/19  09/17/20  Georgette Shell, MD  ondansetron (ZOFRAN) 4 MG tablet Take 1 tablet (4 mg total) by mouth every 6 (six) hours as needed for nausea. Patient not taking: Reported on 07/07/2021 09/16/19   Reche Dixon, PA-C    Physical Exam: Vitals:   07/07/21 5784 07/07/21 0645 07/07/21 0700 07/07/21 0715  BP: 94/73 (!) 92/59 101/80 105/73  Pulse: 100 (!) 101 (!) 102 (!) 103  Resp: (!) 24 16 18  (!) 30  Temp:      TempSrc:      SpO2: 100% 100% 100% 100%  Weight:        Constitutional: Elderly male who appears acutely ill Eyes: PERRL, lids and conjunctivae normal ENMT: Mucous membranes are dry.  Posterior pharynx clear of any exudate or lesions.  Neck: normal, supple, no masses, no thyromegaly Respiratory: clear to auscultation bilaterally, no wheezing, no crackles. Normal respiratory effort. No accessory muscle use.  Cardiovascular: Tachycardic, no murmurs / rubs / gallops. No extremity edema. 2+ pedal pulses.  Abdomen: no tenderness, no masses palpated.  Ostomy in place of the left lower quadrant of the abdomen. Musculoskeletal: no clubbing / cyanosis. No joint deformity upper and lower extremities. Good ROM, no contractures. Normal muscle tone.  Skin: no ulcers. No induration Neurologic: CN 2-12 grossly intact.  Able to move all extremities. Psychiatric: Normal judgment and insight. Alert and oriented x 3. Normal mood.   Data Reviewed:  Reviewed labs, imaging, EKG, and pertinent records as noted above in HPI.  Assessment and Plan: Severe sepsis secondary to unknown source Patient presented and was noted to be febrile up to 102.9 F with tachycardia and tachypnea.  Lactic acid trended with initial fluid boluses from 2->3.  He noted having a intermittently productive cough with gray sputum production, but makes it seems that this is unchanged from baseline.  Chest x-ray did not note any acute abnormality.  Urinalysis did not show significant signs of infection.  Patient had received full  fluid bolus and empiric antibiotics.  Question initially possibility of cholangitis based off CT yesterday but repeat check did not show any significant signs of infection. -Admit to a progressive bed -Follow-up blood and urine cultures -Follow-up urinalysis once able to be obtained -Follow-up procalcitonin -Check uric acid level -Continue empiric antibiotics of cefepime, metronidazole, vancomycin.  De-escalate when medically appropriate. -Continue to trend lactic acid levels -Tylenol as needed for fever  NSTEMI Acute.  Patient reported discomfort across his chest with some difficulty breathing.  Initial labs revealed high-sensitivity troponin elevated up to 8353-> 7977.  Lipid panel from 5/18 on Care Everywhere noted total cholesterol 109, HDL 58, LDL 49, triglycerides 83. -Continue heparin drip per pharmacy -Follow-up echocardiogram -Cardiology consulted,  will follow-up for further recommendations  Hypotension Acute.  Patient initially noted to have blood pressures as low as 82/52.  Blood pressures temporarily improved after initial fluid bolus, but appear to trend down. -Bolus an additional 500 mL of lactated Ringer's, and then continue rate 150 mL/h -Will continue to monitor and consult PCCM if patient is showing signs of becoming fluid overload  Acute kidney injury superimposed on chronic kidney disease stage IIIa On admission creatinine elevated up to 2.3 with BUN 36..  Creatinine had been 1.4 on 5/18 when checked at the [facility].  This is greater than a 0.3 increased from baseline to suggest acute kidney injury.  Possibly related with reports of nausea and vomiting after last chemotherapy treatment or secondary to recent contrast given for CT the abdomen/pelvis had on 5/22 at the [place]. -Check urinalysis -Check urine studies -Check CK -Continue IV fluids -Continue to monitor kidney function daily  Diabetes mellitus type 2, uncontrolled On admission glucose elevated up to 215.  Last hemoglobin A1c 7.5 on 07/07/2021.  Home medication regimen includes metformin 500 mg daily and Admelog 3 units 3 times daily with meals. -Hypoglycemic protocols -Check hemoglobin A1c -Hold metformin -NovoLog insulin 3 units 3 times daily with meals -CBGs before every meal with sensitive SSI -Adjust regimen as needed  Metastatic colon cancer Patient with known metastatic colon cancer status post resection currently receiving chemotherapy with bevacizumab with last infusion on 5/18 and he was on a 14-day course of capecitabine. -Hold capecitabine in the setting of acute infection as discussed with Dr. Alen Blew over the phone  Macrocytic anemia Chronic.  Hemoglobin 9.4, but appears to be around patient's baseline which ranges from 8-10 g/dL.  He denied any reports of bleeding. -Continue to monitor  Hyperlipidemia Home medication regimen includes atorvastatin 40 mg nightly. -Continue atorvastatin and monitor LFTs  Transaminitis hyperbilirubinemia Acute.  AST 92, AST 33, and total bilirubin 2.  No acute runnings noted on CT of the abdomen pelvis.  Prior hepatitis panel testing negative. -Continue to monitor  Cholelithiasis Previous CT scan from yesterday at the [facility] had noted concern for possible choledocholithiasis.  Repeat CT scan of the abdomen pelvis today however does report a cholelithiasis.  Liver enzymes acutely elevated. -Follow-up right upper quadrant ultrasound  Thrombocytopenia Chronic.  Platelet count 79 which appears stable at this time.  Suspect secondary to patient's history of cirrhosis. 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  History of alcohol and tobacco abuse Patient reports that he does not drink any alcohol or smoke cigarettes in 6 months.  BPH -Continue Flomax  GERD -Continue Protonix   Advance Care Planning:   Code Status: Full Code   Consults: none  Family Communication: None requested  Severity of  Illness: The appropriate patient status for this patient is INPATIENT. Inpatient status is judged to be reasonable and necessary in order to provide the required intensity of service to ensure the patient's safety. The patient's presenting symptoms, physical exam findings, and initial radiographic and laboratory data in the context of their chronic comorbidities is felt to place them at high risk for further clinical deterioration. Furthermore, it is not anticipated that the patient will be medically stable for discharge from the hospital within 2 midnights of admission.   * I certify that at the point of admission it is my clinical judgment that the patient will require inpatient hospital care spanning beyond 2 midnights from the point of admission due to high intensity of  service, high risk for further deterioration and high frequency of surveillance required.*  Author: Norval Morton, MD 07/07/2021 7:36 AM  For on call review [email or web address].

## 2021-07-07 NOTE — Progress Notes (Signed)
  Carryover admission to the Day Admitter.  I discussed this case with the EDP, Dr. Dina Rich.  Per these discussions:  This is an 81 year old male who presents from [facility] with severe sepsis due to unclear source as well as NSTEMI, after presenting for evaluation of objective fever, as reported by SNF staff to be 104 earlier in the evening.   EKG performed by EMS was reportedly suggestive of some elevation in the inferior leads, consequently the patient initially presented as a code STEMI.  Dr. Burt Knack of cardiology evaluated the patient and has canceled the code STEMI.   Vital signs in the ED were notable for the following: temperature of 102.5, with mild tachycardia; initial blood pressures in the low 80s over 40s.  Patient has received 30 mL/kg LR bolus (2L) and just started on 3rd L (NS), with some ensuing improvement in blood pressure, most recently 94/73.   The patient's only complaint is a few days of cough, but denies any recent chest pain.  Labs notable for hyponatremia (Na 129),  acute kidney injury, as well as initial troponin of 8000.   Chest x-ray reportedly shows no evidence of pneumonia.  Urinalysis is pending.  In the setting of hypotension and elevated troponin, CTA chest/abdomen/pelvis was obtained, however this imaging was done without contrast in the setting of patient's AKI, and demonstrated no evidence of overt aneurysm/dissection, and otherwise showed no evidence of significant acute process.   Dr. Burt Knack Of cardiology has been formally consulted, evaluated the patient, and feels that the patient's elevated troponin of 8000 is more likely to be a type II process in the setting of supply/demand mismatch.  Cardiology will continue to follow, and recommends heparin drip, trending of troponin, and echocardiogram.  Of note, heparin drip has been ordered, with second set of troponin enzymes currently pending.  I Have also ordered an additional timed troponin for 10 AM, and  ordered echocardiogram.  Additionally, in the setting of borderline hypotension following 30 mL/Kg IVF bolus, EDP discussed patient's case with on-call critical care physician, Dr.Olalere, Who recommended continued aggressive IV fluid resuscitative efforts, and felt that, as patient's maps remain greater than 65, that there is not an indication for critical care involvement at this time, but that CCM service is available prn moving forward.  Patient has received broad-spectrum IV antibiotics in the form of vancomycin, cefepime, Flagyl, all of which I have continued.  Initial lactate 2.0, with repeat value currently pending.  I also ordered add on procalcitonin level as well as add on serum magnesium level.  COVID-19/influenza PCR negative.  Continuous LR at 150 cc/hr has been ordered to start following completion of third liter bolus.   I have placed an order for inpatient admission to the SDU for further evaluation management of presenting severe sepsis of unclear source as well as NSTEMI.  I have placed some additional preliminary admit orders via the adult multi-morbid admission order set.    Babs Bertin, DO Hospitalist

## 2021-07-07 NOTE — Progress Notes (Signed)
Pharmacy Antibiotic Note  Corey Gonzalez is a 81 y.o. male admitted on 07/07/2021 with possible sepsis.  Pharmacy has been consulted for Vancomycin and Cefepime  dosing.  Vancomycin 1250 mg IV given in ED at  0430  Plan: Vancomycin 500 mg IV q48h Cefepime 2 g IV q24h F/U renal function   Weight: 48.3 kg (106 lb 7.7 oz)  Temp (24hrs), Avg:99.3 F (37.4 C), Min:97.6 F (36.4 C), Max:102.1 F (38.9 C)  Recent Labs  Lab 07/07/21 0347  WBC 10.9*  CREATININE 2.43*  LATICACIDVEN 2.0*    Estimated Creatinine Clearance: 16.6 mL/min (A) (by C-G formula based on SCr of 2.43 mg/dL (H)).    No Known Allergies    Corey Gonzalez 07/07/2021 7:00 AM

## 2021-07-07 NOTE — ED Notes (Signed)
Pt refused in and out cath, reports he will try to use the urinal

## 2021-07-07 NOTE — ED Notes (Signed)
Lunch order placed

## 2021-07-07 NOTE — Progress Notes (Addendum)
PHARMACY - PHYSICIAN COMMUNICATION CRITICAL VALUE ALERT - BLOOD CULTURE IDENTIFICATION (BCID)  Corey Gonzalez is an 81 y.o. male who presented to [facility] on 07/07/2021 with a chief complaint of chest pain, back pain, NSTEMI  Assessment:   4/4 blood culture bottles + for MRSA  No clear source New port placed 06/02/2021  Name of physician (or Provider) Contacted: Dr. Fuller Plan  Current antibiotics: Vancomycin, cefepime, flagyl  Changes to prescribed antibiotics recommended:  Continue vancomycin 500mg  q48hr.  Continue cefepime/flagyl for 24-48hr until source identified in case of polymicrobial infection/multiple sources  Results for orders placed or performed during the hospital encounter of 07/07/21  Blood Culture ID Panel (Reflexed) (Collected: 07/07/2021  4:00 AM)  Result Value Ref Range   Enterococcus faecalis NOT DETECTED NOT DETECTED   Enterococcus Faecium NOT DETECTED NOT DETECTED   Listeria monocytogenes NOT DETECTED NOT DETECTED   Staphylococcus species DETECTED (A) NOT DETECTED   Staphylococcus aureus (BCID) DETECTED (A) NOT DETECTED   Staphylococcus epidermidis NOT DETECTED NOT DETECTED   Staphylococcus lugdunensis NOT DETECTED NOT DETECTED   Streptococcus species NOT DETECTED NOT DETECTED   Streptococcus agalactiae NOT DETECTED NOT DETECTED   Streptococcus pneumoniae NOT DETECTED NOT DETECTED   Streptococcus pyogenes NOT DETECTED NOT DETECTED   A.calcoaceticus-baumannii NOT DETECTED NOT DETECTED   Bacteroides fragilis NOT DETECTED NOT DETECTED   Enterobacterales NOT DETECTED NOT DETECTED   Enterobacter cloacae complex NOT DETECTED NOT DETECTED   Escherichia coli NOT DETECTED NOT DETECTED   Klebsiella aerogenes NOT DETECTED NOT DETECTED   Klebsiella oxytoca NOT DETECTED NOT DETECTED   Klebsiella pneumoniae NOT DETECTED NOT DETECTED   Proteus species NOT DETECTED NOT DETECTED   Salmonella species NOT DETECTED NOT DETECTED   Serratia marcescens NOT DETECTED NOT  DETECTED   Haemophilus influenzae NOT DETECTED NOT DETECTED   Neisseria meningitidis NOT DETECTED NOT DETECTED   Pseudomonas aeruginosa NOT DETECTED NOT DETECTED   Stenotrophomonas maltophilia NOT DETECTED NOT DETECTED   Candida albicans NOT DETECTED NOT DETECTED   Candida auris NOT DETECTED NOT DETECTED   Candida glabrata NOT DETECTED NOT DETECTED   Candida krusei NOT DETECTED NOT DETECTED   Candida parapsilosis NOT DETECTED NOT DETECTED   Candida tropicalis NOT DETECTED NOT DETECTED   Cryptococcus neoformans/gattii NOT DETECTED NOT DETECTED   Meth resistant mecA/C and MREJ DETECTED (A) NOT DETECTED    Chania Kochanski E Chaquetta Schlottman 07/07/2021  4:44 PM

## 2021-07-07 NOTE — Progress Notes (Signed)
Spoke with Architectural technologist. Instructed to assess PORT site and notify MD of findings. At this time PORT is not accessed. Fran Lowes, RN VAST

## 2021-07-07 NOTE — Progress Notes (Signed)
ANTICOAGULATION CONSULT NOTE - Follow Up Consult  Pharmacy Consult for Heparin infusion Indication: chest pain/ACS and atrial fibrillation  No Known Allergies  Patient Measurements: Height: 5\' 7"  (170.2 cm) Weight: 48.3 kg (106 lb 7.7 oz) IBW/kg (Calculated) : 66.1 Heparin Dosing Weight: 48.3 kg  Vital Signs: Temp: 98.1 F (36.7 C) (05/23 0743) Temp Source: Oral (05/23 0743) BP: 105/62 (05/23 1400) Pulse Rate: 94 (05/23 1400)  Labs: Recent Labs    07/07/21 0347 07/07/21 0608 07/07/21 1134 07/07/21 1338  HGB 9.4*  --   --   --   HCT 27.0*  --   --   --   PLT 79*  --   --   --   APTT 41*  --   --  103*  LABPROT 24.7*  --   --   --   INR 2.3*  --   --   --   HEPARINUNFRC >1.10*  --   --   --   CREATININE 2.43*  --   --   --   CKTOTAL  --   --  531*  --   TROPONINIHS 8,353* 7,977* 10,105*  --     Estimated Creatinine Clearance: 16.6 mL/min (A) (by C-G formula based on SCr of 2.43 mg/dL (H)).   Medications:  (Not in a hospital admission)   Assessment: 81 y.o. male admitted with chest pain with h/o AFib. Patient takes apixaban at home for Afib - last dose taken 5/22 at 1700. Pharmacy consulted to dose heparin infusion per ACS protocol.   aPTT 103 Current heparin infusion rate: 600 units/hr  Goal of Therapy:  Heparin level 0.3-0.7 units/ml aPTT 66-102 seconds Monitor platelets by anticoagulation protocol: Yes   Plan:  Decrease heparin infusion rate to 550 units/hr Check aPTT in 8 hours Daily CBC, aPTT and heparin level  Monitor for s/sx of bleeding  Kaleen Mask 07/07/2021,2:43 PM

## 2021-07-07 NOTE — Progress Notes (Signed)
Progress Note  Patient Name: Corey Gonzalez Date of Encounter: 07/07/2021  [facility] Cardiologist: None   Subjective   CP with deep breathing and coughing  Inpatient Medications    Scheduled Meds:  vitamin C  500 mg Oral Daily   atorvastatin  20 mg Oral QHS   docusate sodium  100 mg Oral BID   DULoxetine  20 mg Oral Daily   ferrous sulfate  325 mg Oral Q breakfast   insulin aspart  0-6 Units Subcutaneous TID WC   insulin aspart  3 Units Subcutaneous TID WC   melatonin  3 mg Oral QHS   pantoprazole  40 mg Oral Daily   polyethylene glycol  17 g Oral Daily   sodium chloride flush  3 mL Intravenous Q12H   tamsulosin  0.4 mg Oral Daily   Continuous Infusions:  [START ON 07/08/2021] ceFEPime (MAXIPIME) IV     heparin 550 Units/hr (07/07/21 1456)   lactated ringers 150 mL/hr at 07/07/21 0425   metronidazole 500 mg (07/07/21 1432)   [START ON 07/09/2021] vancomycin     PRN Meds: acetaminophen **OR** acetaminophen, albuterol, bisacodyl, emollient, HYDROcodone-acetaminophen, magic mouthwash w/lidocaine, Menthol-Zinc Oxide, ondansetron **OR** ondansetron (ZOFRAN) IV, senna-docusate   Vital Signs    Vitals:   07/07/21 1430 07/07/21 1445 07/07/21 1500 07/07/21 1512  BP: 114/65 101/82 117/73   Pulse: 95 94 96 97  Resp: (!) 21 (!) 26  (!) 27  Temp:    98.5 F (36.9 C)  TempSrc:    Oral  SpO2: 100% 100% 99% 98%  Weight:      Height:        Intake/Output Summary (Last 24 hours) at 07/07/2021 1525 Last data filed at 07/07/2021 1133 Gross per 24 hour  Intake 3500 ml  Output 150 ml  Net 3350 ml      07/07/2021   12:38 PM 07/07/2021    4:04 AM 03/27/2021   11:27 AM  Last 3 Weights  Weight (lbs) 106 lb 7.7 oz 106 lb 7.7 oz 130 lb 1.1 oz  Weight (kg) 48.3 kg 48.3 kg 59 kg      Telemetry    NSR - Personally Reviewed  ECG    Sinus tach, inferior ST elevation with lateral depressions - Personally Reviewed  Physical Exam   GEN: No acute distress.   Neck: No  JVD Cardiac: RRR, no murmurs, rubs, or gallops.  Respiratory: Clear to auscultation bilaterally. GI: Soft, nontender, non-distended  MS: No edema; No deformity. Neuro:  Nonfocal  Psych: Normal affect   Labs    High Sensitivity Troponin:   Recent Labs  Lab 07/07/21 0347 07/07/21 0608 07/07/21 1134  TROPONINIHS 8,353* 7,977* 10,105*     Chemistry Recent Labs  Lab 07/07/21 0347  NA 129*  K 4.0  CL 99  CO2 20*  GLUCOSE 215*  BUN 36*  CREATININE 2.43*  CALCIUM 8.0*  MG 1.5*  PROT 6.1*  ALBUMIN 2.6*  AST 92*  ALT 33  ALKPHOS 80  BILITOT 2.0*  GFRNONAA 26*  ANIONGAP 10    Lipids No results for input(s): CHOL, TRIG, HDL, LABVLDL, LDLCALC, CHOLHDL in the last 168 hours.  Hematology Recent Labs  Lab 07/07/21 0347  WBC 10.9*  RBC 2.75*  HGB 9.4*  HCT 27.0*  MCV 98.2  MCH 34.2*  MCHC 34.8  RDW 20.1*  PLT 79*   Thyroid No results for input(s): TSH, FREET4 in the last 168 hours.  BNPNo results for input(s): BNP, PROBNP  in the last 168 hours.  DDimer No results for input(s): DDIMER in the last 168 hours.   Radiology    DG Chest Port 1 View  Result Date: 07/07/2021 CLINICAL DATA:  Questionable sepsis. EXAM: PORTABLE CHEST 1 VIEW COMPARISON:  12/30/2020. FINDINGS: The heart is enlarged and the mediastinal contours within normal limits. Atherosclerotic calcification of the aorta is noted. The distal tip of right chest port terminates at the cavoatrial junction. Lung volumes are low. Mild atelectasis is noted at the lung bases bilaterally. There is a trace right pleural effusion. No pneumothorax. No acute osseous abnormality. IMPRESSION: 1. Low lung volumes with mild atelectasis at the lung bases. 2. Trace right pleural effusion. 3. Cardiomegaly. Electronically Signed   By: Brett Fairy M.D.   On: 07/07/2021 04:18   ECHOCARDIOGRAM COMPLETE  Result Date: 07/07/2021    ECHOCARDIOGRAM REPORT   Patient Name:   Corey Gonzalez Date of Exam: 07/07/2021 Medical Rec #:  [id number]     Height:       67.0 in Accession #:    [id number]   Weight:       106.5 lb Date of Birth:  [birth year]     BSA:          1.547 m Patient Age:    57 years     BP:           99/68 mmHg Patient Gender: M            HR:           93 bpm. Exam Location:  Inpatient Procedure: 2D Echo, Cardiac Doppler and Color Doppler Indications:    Elevated troponin  History:        Patient has no prior history of Echocardiogram examinations.  Sonographer:    [place] Referring Phys: [id number] [place]  1. Left ventricular ejection fraction, by estimation, is 45-50%. The left ventricle is mildly decreased. The left ventricle demonstrates regional wall motion abnormalities (mid-apical septal hypokinesis). 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 Left ventricular diastolic parameters are consistent with Grade I diastolic dysfunction (impaired relaxation).  2. Right ventricular systolic function is normal. The right ventricular size is mildly enlarged.  3. The mitral valve is grossly normal. Mild to moderate mitral valve regurgitation. Moderate mitral annular calcification. Pulmonary vein blunting, there are two jets.  4. Tricuspid valve regurgitation is mild-moderate, eccentric, and functional.  5. The aortic valve is tricuspid. Aortic valve regurgitation is not visualized. No aortic stenosis is present. Comparison(s): No prior Echocardiogram. FINDINGS  Left Ventricle: Left ventricular ejection fraction, by estimation, is 45 to 50%. The left ventricle has mildly decreased function. The left ventricle demonstrates regional wall motion abnormalities. The left ventricular internal cavity size was normal in size. 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 Left ventricular diastolic parameters are consistent with Grade I diastolic dysfunction (impaired relaxation).  LV Wall Scoring: The mid and distal anterior septum and mid  inferoseptal segment are hypokinetic. Right Ventricle: The right ventricular size is mildly enlarged. No increase in right ventricular wall thickness. Right ventricular systolic function is normal. Left Atrium: Left atrial size was normal in size. Right Atrium: Right atrial size was normal in size. Pericardium: There is no evidence of pericardial effusion. Mitral Valve: The mitral valve is grossly normal. Moderate mitral annular calcification. Mild to moderate mitral valve regurgitation. Tricuspid Valve: Eccentric. The tricuspid valve is normal in structure. Tricuspid valve regurgitation  is mild to moderate. Aortic Valve: The aortic valve is tricuspid. Aortic valve regurgitation is not visualized. No aortic stenosis is present. Aortic valve mean gradient measures 4.0 mmHg. Aortic valve peak gradient measures 8.0 mmHg. Aortic valve area, by VTI measures 2.02 cm. Pulmonic Valve: The pulmonic valve was not well visualized. Pulmonic valve regurgitation is not visualized. No evidence of pulmonic stenosis. Aorta: The aortic root and ascending aorta are structurally normal, with no evidence of dilitation. IAS/Shunts: No atrial level shunt detected by color flow Doppler.  LEFT VENTRICLE PLAX 2D LVIDd:         4.30 cm     Diastology LVIDs:         3.30 cm     LV e' medial:    6.09 cm/s LV PW:         1.30 cm     LV E/e' medial:  15.3 LV IVS:        1.60 cm     LV e' lateral:   8.16 cm/s LVOT diam:     2.20 cm     LV E/e' lateral: 11.4 LV SV:         49 LV SV Index:   32 LVOT Area:     3.80 cm  LV Volumes (MOD) LV vol d, MOD A2C: 92.7 ml LV vol d, MOD A4C: 92.5 ml LV vol s, MOD A2C: 45.6 ml LV vol s, MOD A4C: 33.0 ml LV SV MOD A2C:     47.1 ml LV SV MOD A4C:     92.5 ml LV SV MOD BP:      53.9 ml RIGHT VENTRICLE RV Basal diam:  4.40 cm RV Mid diam:    4.00 cm RV S prime:     7.51 cm/s TAPSE (M-mode): 1.7 cm LEFT ATRIUM             Index        RIGHT ATRIUM           Index LA diam:        2.30 cm 1.49 cm/m   RA Area:      17.60 cm LA Vol (A2C):   32.6 ml 21.07 ml/m  RA Volume:   39.50 ml  25.53 ml/m LA Vol (A4C):   40.0 ml 25.86 ml/m LA Biplane Vol: 38.2 ml 24.69 ml/m  AORTIC VALVE                    PULMONIC VALVE AV Area (Vmax):    1.97 cm     PV Vmax:       0.86 m/s AV Area (Vmean):   1.90 cm     PV Peak grad:  3.0 mmHg AV Area (VTI):     2.02 cm AV Vmax:           141.00 cm/s AV Vmean:          95.800 cm/s AV VTI:            0.243 m AV Peak Grad:      8.0 mmHg AV Mean Grad:      4.0 mmHg LVOT Vmax:         73.20 cm/s LVOT Vmean:        47.900 cm/s LVOT VTI:          0.129 m LVOT/AV VTI ratio: 0.53  AORTA Ao Root diam: 3.20 cm Ao Asc diam:  3.40 cm MITRAL VALVE  TRICUSPID VALVE MV Area (PHT): 7.74 cm     TR Peak grad:   30.9 mmHg MV Decel Time: 98 msec      TR Vmax:        278.00 cm/s MR Peak grad: 78.5 mmHg MR Mean grad: 50.0 mmHg     SHUNTS MR Vmax:      443.00 cm/s   Systemic VTI:  0.13 m MR Vmean:     328.0 cm/s    Systemic Diam: 2.20 cm MV E velocity: 93.10 cm/s MV A velocity: 108.00 cm/s MV E/A ratio:  0.86 Rudean Haskell MD Electronically signed by Rudean Haskell MD Signature Date/Time: 07/07/2021/12:18:58 PM    Final    CT CHEST ABDOMEN PELVIS WO CONTRAST  Result Date: 07/07/2021 CLINICAL DATA:  Chest wall pain, nontraumatic, with infection or inflammation suspected. Fever EXAM: CT CHEST, ABDOMEN AND PELVIS WITHOUT CONTRAST TECHNIQUE: Multidetector CT imaging of the chest, abdomen and pelvis was performed following the standard protocol without IV contrast. RADIATION DOSE REDUCTION: This exam was performed according to the departmental dose-optimization program which includes automated exposure control, adjustment of the mA and/or kV according to patient size and/or use of iterative reconstruction technique. COMPARISON:  None Available. FINDINGS: CT CHEST FINDINGS Cardiovascular: Normal heart size. No pericardial effusion. Extensive atheromatous calcification of the aorta and  coronaries. Right-sided porta catheter with tip at the upper cavoatrial junction. Mediastinum/Nodes: Negative for mass or adenopathy Lungs/Pleura: Dependent atelectasis. There is no edema, consolidation, effusion, or pneumothorax. Musculoskeletal: No acute finding. Gynecomastia. CT ABDOMEN PELVIS FINDINGS Hepatobiliary: Lobulated liver with large caudate lobe and fissures. Cholelithiasis without findings of acute cholecystitis. Pancreas: Generalized atrophy. Spleen: Mildly enlarged with 14 cm craniocaudal span. Adrenals/Urinary Tract: 2.8 cm right adrenal mass. Heterogeneity of renal cortex with patchy high-density areas bilaterally. Contrast is being excreted from unknown prior study. Negative bladder. Stomach/Bowel: Descending colostomy. Negative for bowel obstruction or visible inflammation. Vascular/Lymphatic: Atheromatous calcification of the aorta and branch vessels. 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 IMPRESSION: 1. No acute finding. 2. 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 History of lung cancer, recommend follow-up with prior staging scans. 3. Recent contrast administration from unknown procedure. Patchy renal cortical density could be from ATN or scarring. 4. Cirrhotic appearance of the liver. Please correlate for risk factors. 5.  Aortic Atherosclerosis (ICD10-I70.0). 6. Cholelithiasis. Electronically Signed   By: Jorje Guild M.D.   On: 07/07/2021 06:03   US Abdomen Limited RUQ (LIVER/GB)  Result Date: 07/07/2021 CLINICAL DATA:  Evaluate for choledocholithiasis. EXAM: ULTRASOUND ABDOMEN LIMITED RIGHT UPPER QUADRANT COMPARISON:  CT 07/07/2021 FINDINGS: Gallbladder: Large stone within the gallbladder measures 1.4 cm. Debris/sludge noted within the gallbladder. Mild gallbladder wall thickening measures 4.4  mm. Negative sonographic Murphy's sign. Common bile duct: Diameter: 5 mm Liver: Increased parenchymal echogenicity. Portal vein is patent on color Doppler imaging with normal direction of blood flow towards the liver. Other: None. IMPRESSION: 1. Gallstone and sludge noted within gallbladder. Mild gallbladder wall thickening. Note: In the setting of cirrhosis (as suggested on CT from earlier today), gallbladder wall thickening may be a nonspecific finding. If there is a high suspicion for acute cholecystitis consider further investigation with nuclear medicine hepatic biliary scan. 2. Increased parenchymal echogenicity suggestive of hepatic steatosis. 3. No biliary ductal dilatation identified. Electronically Signed   By: Kerby Moors M.D.   On: 07/07/2021 10:04  Cardiac Studies   Echo images personally reviewed and show anterior hypokinesis with normal inferior wall motion.  EF 45 to 50%.  Patient Profile     81 y.o. male with sepsis and elevated troponin  Assessment & Plan    Elevated troponin: ECG was concerning for inferior ST changes.  Echocardiogram shows normal inferior wall motion.  Perhaps, there is some fixed RCA disease that under stress caused inferior ST changes/demand ischemia.  He was not a candidate for cath given his infection and acute renal failure.  Currently having only pleuritic chest pain, worse with coughing and deep breathing.  Continue heparin for 48 hours.  Continue supportive therapy.  No signs of heart failure at this time.  EF is only mildly decreased so hopefully even with large infusions of volume for presumed sepsis, he will not have heart failure symptoms.  We will continue to follow.     For questions or updates, please contact [place] Please consult [email or web address] for contact info under        Signed, Larae Grooms, MD  07/07/2021, 3:25 PM

## 2021-07-07 NOTE — ED Provider Notes (Signed)
[place] EMERGENCY DEPARTMENT Provider Note   CSN: [id number] Arrival date & time: 07/07/21  0347     History  Chief Complaint  Patient presents with   Fever    Corey Gonzalez is a 81 y.o. male.  HPI    This is an 81 year old male with a history of diabetes, hypertension, colon cancer status post colostomy who presents with fever and concern for possible STEMI.  STEMI was activated by EMS in route due to inferior ST elevations.  Primarily they were called out to patient's living facility for fever.  Temperature reported 104.  EMS reports temperature of 103.5.  He was given 2 of Tylenol.  They report that he has had a productive cough and some shortness of breath.  He does have some chest discomfort with coughing.  He also reports back pain.  No abdominal pain or changes in ostomy output.   Home Medications Prior to Admission medications   Medication Sig Start Date End Date Taking? Authorizing Provider  acetaminophen (TYLENOL) 325 MG tablet Take 650 mg by mouth every 6 (six) hours as needed. 03/20/18   [provider]  apixaban (ELIQUIS) 5 MG TABS tablet Take 5 mg by mouth 2 (two) times daily.    [provider]  atorvastatin (LIPITOR) 20 MG tablet Take 20 mg by mouth at bedtime. 09/17/20   [provider]  bisacodyl (DULCOLAX) 10 MG suppository Place 1 suppository (10 mg total) rectally daily as needed for moderate constipation. 09/18/19   Georgette Shell, MD  docusate sodium (COLACE) 100 MG capsule Take 1 capsule (100 mg total) by mouth 2 (two) times daily. 09/18/19   Georgette Shell, MD  DULoxetine (CYMBALTA) 20 MG capsule Take 20 mg by mouth daily.    [provider]  Ensure Max Protein (ENSURE MAX PROTEIN) LIQD Take 330 mLs (11 oz total) by mouth at bedtime. 01/05/21   Annita Brod, MD  feeding supplement, GLUCERNA SHAKE, (GLUCERNA SHAKE) LIQD Take 237 mLs by mouth 2 (two) times daily between meals. 01/05/21    Annita Brod, MD  HYDROcodone-acetaminophen (NORCO/VICODIN) 5-325 MG tablet Take 1-2 tablets by mouth every 6 (six) hours as needed for moderate pain. 01/02/21   Duanne Guess, PA-C  insulin regular (NOVOLIN R) 100 units/mL injection Inject 3 Units into the skin 3 (three) times daily before meals.    [provider]  Melatonin 3 MG CAPS Take 3 mg by mouth at bedtime as needed.    [provider]  metFORMIN (GLUCOPHAGE) 500 MG tablet Take 1 tablet (500 mg total) by mouth daily with breakfast. 09/18/19 09/17/20  Georgette Shell, MD  methocarbamol (ROBAXIN) 500 MG tablet Take 1 tablet (500 mg total) by mouth every 6 (six) hours as needed for muscle spasms. 01/05/21   Annita Brod, MD  Multiple Vitamins-Minerals (MULTIVITAMIN ADULT, MINERALS,) TABS Take 1 tablet by mouth daily. Patient not taking: No sig reported 05/27/20   [provider]  omeprazole (PRILOSEC) 20 MG capsule Take 20 mg by mouth daily.    [provider]  ondansetron (ZOFRAN) 4 MG tablet Take 1 tablet (4 mg total) by mouth every 6 (six) hours as needed for nausea. 09/16/19   Reche Dixon, PA-C  polyethylene glycol powder (GLYCOLAX/MIRALAX) 17 GM/SCOOP powder Take 17 g by mouth daily. MIX 17GM (1 CAPFUL) BY MOUTH ONCE EVERY DAY MIX 17 GRAMS (USE BOTTLE CAP) IN LIQUID OF CHOICE AS DIRECTED TO PREVENT CONSTIPATION 09/17/20  [provider]  senna-docusate (SENOKOT-S) 8.6-50 MG tablet Take 1 tablet by mouth at bedtime as needed for mild constipation. 09/18/19   Georgette Shell, MD  tamsulosin (FLOMAX) 0.4 MG CAPS capsule Take 0.4 mg by mouth daily.    [provider]      Allergies    Patient has no known allergies.    Review of Systems   Review of Systems  Constitutional:  Positive for fever.  Respiratory:  Positive for cough and shortness of breath.   All other systems reviewed and are negative.  Physical Exam Updated Vital Signs BP (!) 101/51   Pulse (!) 113    Temp 98.3 F (36.8 C) (Temporal)   Resp (!) 40   Wt 48.3 kg   SpO2 98%   BMI 16.68 kg/m  Physical Exam Vitals and nursing note reviewed.  Constitutional:      Appearance: He is well-developed.     Comments: Elderly, chronically ill-appearing  HENT:     Head: Normocephalic and atraumatic.     Mouth/Throat:     Mouth: Mucous membranes are dry.  Eyes:     Pupils: Pupils are equal, round, and reactive to light.  Cardiovascular:     Rate and Rhythm: Regular rhythm. Tachycardia present.     Heart sounds: Normal heart sounds. No murmur heard. Pulmonary:     Effort: Pulmonary effort is normal. No respiratory distress.     Breath sounds: Rales present. No wheezing.  Abdominal:     General: Bowel sounds are normal.     Palpations: Abdomen is soft.     Tenderness: There is no abdominal tenderness. There is no rebound.     Comments: Ostomy left mid abdomen, brown stool noted  Musculoskeletal:     Cervical back: Neck supple.  Lymphadenopathy:     Cervical: No cervical adenopathy.  Skin:    General: Skin is warm and dry.  Neurological:     Mental Status: He is alert and oriented to person, place, and time.  Psychiatric:        Mood and Affect: Mood normal.    ED Results / Procedures / Treatments   Labs (all labs ordered are listed, but only abnormal results are displayed) Labs Reviewed  LACTIC ACID, PLASMA - Abnormal; Notable for the following components:      Result Value   Lactic Acid, Venous 2.0 (*)    All other components within normal limits  COMPREHENSIVE METABOLIC PANEL - Abnormal; Notable for the following components:   Sodium 129 (*)    CO2 20 (*)    Glucose, Bld 215 (*)    BUN 36 (*)    Creatinine, Ser 2.43 (*)    Calcium 8.0 (*)    Total Protein 6.1 (*)    Albumin 2.6 (*)    AST 92 (*)    Total Bilirubin 2.0 (*)    GFR, Estimated 26 (*)    All other components within normal limits  CBC WITH DIFFERENTIAL/PLATELET - Abnormal; Notable for the following  components:   WBC 10.9 (*)    RBC 2.75 (*)    Hemoglobin 9.4 (*)    HCT 27.0 (*)    MCH 34.2 (*)    RDW 20.1 (*)    Platelets 79 (*)    Neutro Abs 9.4 (*)    Lymphs Abs 0.4 (*)    Monocytes Absolute 1.1 (*)    Abs Immature Granulocytes 0.08 (*)    All other components within normal  limits  PROTIME-INR - Abnormal; Notable for the following components:   Prothrombin Time 24.7 (*)    INR 2.3 (*)    All other components within normal limits  APTT - Abnormal; Notable for the following components:   aPTT 41 (*)    All other components within normal limits  HEPARIN LEVEL (UNFRACTIONATED) - Abnormal; Notable for the following components:   Heparin Unfractionated >1.10 (*)    All other components within normal limits  TROPONIN I (HIGH SENSITIVITY) - Abnormal; Notable for the following components:   Troponin I (High Sensitivity) 8,353 (*)    All other components within normal limits  RESP PANEL BY RT-PCR (FLU A&B, COVID) ARPGX2  CULTURE, BLOOD (ROUTINE X 2)  CULTURE, BLOOD (ROUTINE X 2)  URINE CULTURE  LACTIC ACID, PLASMA  URINALYSIS, ROUTINE W REFLEX MICROSCOPIC  APTT  TROPONIN I (HIGH SENSITIVITY)    EKG EKG Interpretation  Date/Time:  Tuesday Jul 07 2021 04:29:05 EDT Ventricular Rate:  110 PR Interval:  202 QRS Duration: 103 QT Interval:  321 QTC Calculation: 435 R Axis:   28 Text Interpretation: Sinus tachycardia Anterior infarct, old Borderline repolarization abnormality Minimal ST elevation, inferior leads Confirmed by Thayer Jew ([id number]) on 07/07/2021 4:29:55 AM  Radiology DG Chest Port 1 View  Result Date: 07/07/2021 CLINICAL DATA:  Questionable sepsis. EXAM: PORTABLE CHEST 1 VIEW COMPARISON:  12/30/2020. FINDINGS: The heart is enlarged and the mediastinal contours within normal limits. Atherosclerotic calcification of the aorta is noted. The distal tip of right chest port terminates at the cavoatrial junction. Lung volumes are low. Mild atelectasis is noted at  the lung bases bilaterally. There is a trace right pleural effusion. No pneumothorax. No acute osseous abnormality. IMPRESSION: 1. Low lung volumes with mild atelectasis at the lung bases. 2. Trace right pleural effusion. 3. Cardiomegaly. Electronically Signed   By: Brett Fairy M.D.   On: 07/07/2021 04:18    Procedures .Critical Care Performed by: Merryl Hacker, MD Authorized by: Merryl Hacker, MD   Critical care provider statement:    Critical care time (minutes):  70   Critical care was necessary to treat or prevent imminent or life-threatening deterioration of the following conditions:  Sepsis and cardiac failure   Critical care was time spent personally by me on the following activities:  Development of treatment plan with patient or surrogate, discussions with consultants, evaluation of patient's response to treatment, examination of patient, ordering and review of laboratory studies, ordering and review of radiographic studies, ordering and performing treatments and interventions, pulse oximetry, re-evaluation of patient's condition and review of old charts    Medications Ordered in ED Medications  lactated ringers infusion ( Intravenous New Bag/Given 07/07/21 0425)  lactated ringers bolus 1,000 mL (1,000 mLs Intravenous New Bag/Given 07/07/21 0424)    And  lactated ringers bolus 1,000 mL (has no administration in time range)  ceFEPIme (MAXIPIME) 2 g in sodium chloride 0.9 % 100 mL IVPB (2 g Intravenous New Bag/Given 07/07/21 0427)  metroNIDAZOLE (FLAGYL) IVPB 500 mg (has no administration in time range)  vancomycin (VANCOREADY) IVPB 1250 mg/250 mL (has no administration in time range)    ED Course/ Medical Decision Making/ A&P Clinical Course as of 07/07/21 0635  Tue Jul 07, 2021  0615 Additional normal saline bolus ordered given borderline blood pressures and hyponatremia with a sodium of 129. [CH]  0618 On recheck, blood pressure 104/51.  Technically MAP greater than 65.   However, patient is multifactorial.  He has  evidence of endorgan damage including AKI and likely has some shock.  Also has a significantly increased troponin.  We will repeat.  Will discuss with intensivist. [CH]  [phone] Spoke to Dr. Ander Slade, critical care given borderline blood pressures and mixed picture.  He agreed with continued resuscitation.  Did not feel he needed to come to the ICU but could go to the stepdown unit under the hospitalist.  Repeat troponin is pending.  Cardiology is following. [CH]    Clinical Course User Index [CH] Shane Melby, Barbette Hair, MD                           Medical Decision Making Amount and/or Complexity of Data Reviewed Labs: ordered. Radiology: ordered. ECG/medicine tests: ordered.  Risk Prescription drug management. Decision regarding hospitalization.   This patient presents to the ED for concern of STEMI, fever, this involves an extensive number of treatment options, and is a complaint that carries with it a high risk of complications and morbidity.  I considered the following differential and admission for this acute, potentially life threatening condition.  The differential diagnosis includes sepsis, acute ischemia, demand ischemia, pneumonia  MDM:    This is an 81 year old male who presents with both fever and concern for STEMI.  He was evaluated upon arrival.  He does have some inferior ST changes; however, his primary complaint is not chest pain and he has a temperature of 103.5.  He was evaluated by cardiology and the STEMI doc.  STEMI was canceled given mixed picture.  Will trend troponins.  Sepsis work-up was initiated.  Unclear source.  He was given broad-spectrum antibiotics.  He was given 30 cc/kg of lactated Ringer's.  Labs notable for slight hyponatremia at 129.  He has an AKI with creatinine of 2.5.  Troponin is greater than 8000.  He does not have any ongoing chest pain but is complaining primarily of back pain.  He cannot have a contrasted CT  scan.  I did get a noncontrasted CT scan of the chest abdomen pelvis to look at the caliber of the aorta and to rule out any other possible intra-abdominal process.  There is no obvious AAA or dissection on this limited study.  I personally reviewed the films.  There is no obvious pneumonia on chest x-ray.  Urinalysis is pending.  I did discuss briefly with critical care.  His blood pressures have remained borderline even with fluid resuscitation.  Technically maps have been greater than 65.  They recommend admission to the stepdown unit with ongoing resuscitation.  Repeat troponin is pending.  Cardiology is following.  (Labs, imaging, consults)  Labs: I Ordered, and personally interpreted labs.  The pertinent results include: Troponin greater than 8000, creatinine 2.5, leukocytosis to 10, sodium 129  Imaging Studies ordered: I ordered imaging studies including CT chest abdomen and pelvis, chest x-ray I independently visualized and interpreted imaging. I agree with the radiologist interpretation  Additional history obtained from prior evaluations.  External records from outside source obtained and reviewed including including admissions  Cardiac Monitoring: The patient was maintained on a cardiac monitor.  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[place])      Medicines Meds ordered this encounter  Medications   lactated ringers infusion   AND Linked Order Group    lactated ringers bolus 1,000 mL     Order Specific Question:   Enter  Patient Weight in Kilograms     Answer:   59    lactated ringers bolus 1,000 mL     Order Specific Question:   Enter Patient Weight in Kilograms     Answer:   59   ceFEPIme (MAXIPIME) 2 g in sodium chloride 0.9 % 100 mL IVPB    Order Specific Question:   Antibiotic Indication:    Answer:   Other Indication (list below)    Order Specific Question:   Other Indication:    Answer:   Unknown source   metroNIDAZOLE (FLAGYL) IVPB 500 mg    Order Specific Question:   Antibiotic Indication:    Answer:   Other Indication (list below)    Order Specific Question:   Other Indication:    Answer:   Unknown source   DISCONTD: vancomycin (VANCOCIN) IVPB 1000 mg/200 mL premix    Order Specific Question:   Indication:    Answer:   Other Indication (list below)    Order Specific Question:   Other Indication:    Answer:   Unknown source   vancomycin (VANCOREADY) IVPB 1250 mg/250 mL    Order Specific Question:   Indication:    Answer:   Other Indication (list below)    Order Specific Question:   Other Indication:    Answer:   Unknown source   fentaNYL (SUBLIMAZE) injection 50 mcg   heparin ADULT infusion 100 units/mL (25000 units/211mL)   sodium chloride 0.9 % bolus 1,000 mL    I have reviewed the patients home medicines and have made adjustments as needed  Problem List / ED Course: Problem List Items Addressed This Visit   None Visit Diagnoses     Severe sepsis (HCC)    -  Primary   NSTEMI (non-ST elevated myocardial infarction) ([place])       Relevant Medications   heparin ADULT infusion 100 units/mL (25000 units/271mL)   AKI (acute kidney injury) ([place])                       Final Clinical Impression(s) / ED Diagnoses Final diagnoses:  None    Rx / DC Orders ED Discharge Orders     None         Merryl Hacker, MD 07/07/21 [phone]

## 2021-07-07 NOTE — Progress Notes (Signed)
Inpatient Diabetes Program Recommendations  AACE/ADA: New Consensus Statement on Inpatient Glycemic Control (2015)  Target Ranges:  Prepandial:   less than 140 mg/dL      Peak postprandial:   less than 180 mg/dL (1-2 hours)      Critically ill patients:  140 - 180 mg/dL   Lab Results  Component Value Date   GLUCAP 226 (H) 07/07/2021   HGBA1C 7.5 (H) 07/07/2021    Latest Reference Range & Units 07/07/21 03:47  GFR, Estimated >60 mL/min 26 (L)  (L): Data is abnormally low  Review of Glycemic Control  Diabetes history: DM2 Outpatient Diabetes medications: Metformin 500 mg qd Current orders for Inpatient glycemic control: Novolog 3 units tid meal coverage, Novolog 0-6 units tid correction  Inpatient Diabetes Program Recommendations:   Patient in ED Noted GFR 26-consider D/C of Metformin on discharge Will follow during hospitalization if admitted.  Thank you, Nani Gasser. Cieanna Stormes, RN, MSN, CDE  Diabetes Coordinator Inpatient Glycemic Control Team Team Pager [phone] (8am-5pm) 07/07/2021 12:58 PM

## 2021-07-07 NOTE — Progress Notes (Signed)
Requested IV team consult to assess the patiens port site.  It is bleeding and per his report has been going on since for a couple weeks now.  Band aid placed prior to arrival to department.    Richardean Canal RN, BSN, CCRN-K

## 2021-07-07 NOTE — ED Triage Notes (Signed)
Pt from [facility] by [facility] for labored breathing and back pain. Noted to have fever at facility, given "2 tylenol" Reports productive cough x 2 weeks, on eliquis for afib. EMS EKG showing st elevation in inferior leads. Colostomy noted to LLQ; 16 g to L AC. EMS gave 577ml NS, nitro 1

## 2021-07-07 NOTE — Sepsis Progress Note (Signed)
Elink following Code Sepsis. 

## 2021-07-07 NOTE — ED Notes (Signed)
Pt switched to hospital bed for comfort. [place] at bedside

## 2021-07-07 NOTE — Consult Note (Signed)
Cardiology Consultation:   Patient ID: Corey Gonzalez MRN: [id number]; DOB: December 03, 1940  Admit date: 07/07/2021 Date of Consult: 07/07/2021  PCP:  Wendie Agreste, NP   [facility] Providers Cardiologist:  None        Patient Profile:   Corey Gonzalez is a 81 y.o. male with a hx of Atrial fibrillation on eilquis, s/p left hip fracture in 2022, T2DM, HTN, CKD, hx of tobacco and alcohol use who is being seen 07/07/2021 for the evaluation of abnormal ECG at the request of Dr. Dina Rich.  History of Present Illness:   Mr. Crilly was transferred from OSH for "STEMI" alert. Patient is seen at bedside, reports not feeling well (feverish, cough, shortness of breath) since this past Friday. Patient says whenever he coughs, he started having epigastric sharp pain. EMS was called to his facility tonight due to worsening back pain. They run a rhythm strip which demonstrated STE inferior lead with STD lateral leads. Patient's last dose of eliquis was 5/22 pm. Denies syncope, heart palpitations, nausea, vomiting, dysuria, diarrhea, or any bleeding events.  Labs pending  Past Medical History:  Diagnosis Date   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[place])    Diabetes mellitus without complication ([place])    Hypertension     Past Surgical History:  Procedure Laterality Date   COLOSTOMY     INTRAMEDULLARY (IM) NAIL INTERTROCHANTERIC Left 09/15/2019   Procedure: INTRAMEDULLARY (IM) NAIL INTERTROCHANTRIC;  Surgeon: Leim Fabry, MD;  Location: [facility] ORS;  Service: Orthopedics;  Laterality: Left;   JOINT REPLACEMENT     bilateral knees   JOINT REPLACEMENT Right    hip   ORIF FEMUR FRACTURE Left 12/31/2020   Procedure: OPEN REDUCTION INTERNAL FIXATION (ORIF) DISTAL FEMUR FRACTURE - PERIPROSTHETIC FRACTURE;  Surgeon: Hessie Knows, MD;  Location: [facility] ORS;  Service: Orthopedics;  Laterality: Left;       Inpatient Medications: Scheduled Meds:  Continuous Infusions:  ceFEPime (MAXIPIME) IV 2 g (07/07/21 0427)   lactated ringers  1,000 mL (07/07/21 0424)   And   lactated ringers     lactated ringers 150 mL/hr at 07/07/21 0425   metronidazole 500 mg (07/07/21 0430)   vancomycin 1,250 mg (07/07/21 0433)   PRN Meds:   Allergies:   No Known Allergies  Social History:   Social History   Socioeconomic History   Marital status: Single    Spouse name: Not on file   Number of children: Not on file   Years of education: Not on file   Highest education level: Not on file  Occupational History   Not on file  Tobacco Use   Smoking status: Some Days   Smokeless tobacco: Never   Tobacco comments:    ~5 per week  Substance and Sexual Activity   Alcohol use: Not Currently    Alcohol/week: 35.0 standard drinks    Types: 35 Standard drinks or equivalent per week   Drug use: Not on file   Sexual activity: Not on file  Other Topics Concern   Not on file  Social History Narrative   Not on file   Social Determinants of Health   Financial Resource Strain: Not on file  Food Insecurity: Not on file  Transportation Needs: Not on file  Physical Activity: Not on file  Stress: Not on file  Social Connections: Not on file  Intimate Partner Violence: Not on file    Family History:    Family History  Problem Relation Age of Onset   Diabetes Other  ROS:  Please see the history of present illness.   All other ROS reviewed and negative.     Physical Exam/Data:   Vitals:   07/07/21 0400 07/07/21 0404 07/07/21 0434  BP: (!) 101/51    Pulse: (!) 113    Resp: (!) 40    Temp: 98.3 F (36.8 C)  (!) 102.1 F (38.9 C)  TempSrc: Temporal  Rectal  SpO2: 98%    Weight:  48.3 kg    No intake or output data in the 24 hours ending 07/07/21 0435    07/07/2021    4:04 AM 03/27/2021   11:27 AM 01/11/2021    9:06 AM  Last 3 Weights  Weight (lbs) 106 lb 7.7 oz 130 lb 1.1 oz 129 lb  Weight (kg) 48.3 kg 59 kg 58.514 kg     Body mass index is 16.68 kg/m.  General:  Well nourished, well developed, in no acute  distress HEENT: normal Neck: no JVD Vascular: No carotid bruits; Distal pulses 2+ bilaterally Cardiac:  tenderness on palpation (epigastric area), normal S1, S2; RRR; no murmur  Lungs:  clear to auscultation bilaterally, no wheezing, rhonchi or rales  Abd: soft, nontender, no hepatomegaly  Ext: 2+ pitting edema Musculoskeletal:  No deformities, BUE and BLE strength normal and equal Skin: warm and dry  Neuro:  CNs 2-12 intact, no focal abnormalities noted Psych:  Normal affect    Relevant CV Studies:   Laboratory Data:  High Sensitivity Troponin:  No results for input(s): TROPONINIHS in the last 720 hours.   ChemistryNo results for input(s): NA, K, CL, CO2, GLUCOSE, BUN, CREATININE, CALCIUM, MG, GFRNONAA, GFRAA, ANIONGAP in the last 168 hours.  No results for input(s): PROT, ALBUMIN, AST, ALT, ALKPHOS, BILITOT in the last 168 hours. Lipids No results for input(s): CHOL, TRIG, HDL, LABVLDL, LDLCALC, CHOLHDL in the last 168 hours.  Hematology Recent Labs  Lab 07/07/21 0347  WBC 10.9*  RBC 2.75*  HGB 9.4*  HCT 27.0*  MCV 98.2  MCH 34.2*  MCHC 34.8  RDW 20.1*  PLT PENDING   Thyroid No results for input(s): TSH, FREET4 in the last 168 hours.  BNPNo results for input(s): BNP, PROBNP in the last 168 hours.  DDimer No results for input(s): DDIMER in the last 168 hours.   Radiology/Studies:  [facility] Chest Port 1 View  Result Date: 07/07/2021 CLINICAL DATA:  Questionable sepsis. EXAM: PORTABLE CHEST 1 VIEW COMPARISON:  12/30/2020. FINDINGS: The heart is enlarged and the mediastinal contours within normal limits. Atherosclerotic calcification of the aorta is noted. The distal tip of right chest port terminates at the cavoatrial junction. Lung volumes are low. Mild atelectasis is noted at the lung bases bilaterally. There is a trace right pleural effusion. No pneumothorax. No acute osseous abnormality. IMPRESSION: 1. Low lung volumes with mild atelectasis at the lung bases. 2. Trace right  pleural effusion. 3. Cardiomegaly. Electronically Signed   By: Brett Fairy M.D.   On: 07/07/2021 04:18     Assessment and Plan:   #Abnormal EKG #Chest pain -his chest pain appears pleuritic, EKG on admission demonstrated STE lead III/aVF with deep Q wave. Patient's temperature 103 degree. -discussed with Dr. Burt Knack, canceled STEMI alert -acquire a TTE am -continue Telemetry -continue trend troponins -risk stratification with A1C, TSH and lipid panel -keep heparin gtt for 48 hours.   Risk Assessment/Risk Scores:     HEAR Score (for undifferentiated chest pain):  HEAR Score: 3{     For questions or updates,  please contact [place] Please consult [email or web address] for contact info under    Signed, Laurice Record, MD  07/07/2021 4:35 AM

## 2021-07-08 ENCOUNTER — Inpatient Hospital Stay (HOSPITAL_COMMUNITY): Payer: No Typology Code available for payment source

## 2021-07-08 ENCOUNTER — Encounter (HOSPITAL_COMMUNITY): Payer: Self-pay | Admitting: Internal Medicine

## 2021-07-08 DIAGNOSIS — R6520 Severe sepsis without septic shock: Secondary | ICD-10-CM | POA: Diagnosis not present

## 2021-07-08 DIAGNOSIS — N179 Acute kidney failure, unspecified: Secondary | ICD-10-CM | POA: Diagnosis not present

## 2021-07-08 DIAGNOSIS — R778 Other specified abnormalities of plasma proteins: Secondary | ICD-10-CM | POA: Diagnosis not present

## 2021-07-08 DIAGNOSIS — I248 Other forms of acute ischemic heart disease: Secondary | ICD-10-CM | POA: Diagnosis not present

## 2021-07-08 DIAGNOSIS — A419 Sepsis, unspecified organism: Secondary | ICD-10-CM | POA: Diagnosis not present

## 2021-07-08 LAB — COMPREHENSIVE METABOLIC PANEL
ALT: 27 U/L (ref 0–44)
AST: 85 U/L — ABNORMAL HIGH (ref 15–41)
Albumin: 2.2 g/dL — ABNORMAL LOW (ref 3.5–5.0)
Alkaline Phosphatase: 76 U/L (ref 38–126)
Anion gap: 10 (ref 5–15)
BUN: 45 mg/dL — ABNORMAL HIGH (ref 8–23)
CO2: 18 mmol/L — ABNORMAL LOW (ref 22–32)
Calcium: 8 mg/dL — ABNORMAL LOW (ref 8.9–10.3)
Chloride: 102 mmol/L (ref 98–111)
Creatinine, Ser: 3.2 mg/dL — ABNORMAL HIGH (ref 0.61–1.24)
GFR, Estimated: 19 mL/min — ABNORMAL LOW (ref >60)
Glucose, Bld: 117 mg/dL — ABNORMAL HIGH (ref 70–99)
Potassium: 4.6 mmol/L (ref 3.5–5.1)
Sodium: 130 mmol/L — ABNORMAL LOW (ref 135–145)
Total Bilirubin: 1.7 mg/dL — ABNORMAL HIGH (ref 0.3–1.2)
Total Protein: 5.6 g/dL — ABNORMAL LOW (ref 6.5–8.1)

## 2021-07-08 LAB — APTT
aPTT: 146 seconds — ABNORMAL HIGH (ref 24–36)
aPTT: 81 seconds — ABNORMAL HIGH (ref 24–36)
aPTT: 91 seconds — ABNORMAL HIGH (ref 24–36)

## 2021-07-08 LAB — URINE CULTURE: Culture: NO GROWTH

## 2021-07-08 LAB — GLUCOSE, CAPILLARY
Glucose-Capillary: 124 mg/dL — ABNORMAL HIGH (ref 70–99)
Glucose-Capillary: 144 mg/dL — ABNORMAL HIGH (ref 70–99)
Glucose-Capillary: 189 mg/dL — ABNORMAL HIGH (ref 70–99)
Glucose-Capillary: 127 mg/dL — ABNORMAL HIGH (ref 70–99)

## 2021-07-08 LAB — CBC
HCT: 26.3 % — ABNORMAL LOW (ref 39.0–52.0)
Hemoglobin: 8.8 g/dL — ABNORMAL LOW (ref 13.0–17.0)
MCH: 33.3 pg (ref 26.0–34.0)
MCHC: 33.5 g/dL (ref 30.0–36.0)
MCV: 99.6 fL (ref 80.0–100.0)
Platelets: 51 10*3/uL — ABNORMAL LOW (ref 150–400)
RBC: 2.64 MIL/uL — ABNORMAL LOW (ref 4.22–5.81)
RDW: 20.7 % — ABNORMAL HIGH (ref 11.5–15.5)
WBC: 10.2 10*3/uL (ref 4.0–10.5)
nRBC: 0 % (ref 0.0–0.2)

## 2021-07-08 LAB — HEPARIN LEVEL (UNFRACTIONATED): Heparin Unfractionated: >1.1 IU/mL — ABNORMAL HIGH (ref 0.30–0.70)

## 2021-07-08 LAB — MRSA NEXT GEN BY PCR, NASAL: MRSA by PCR Next Gen: DETECTED — AB

## 2021-07-08 MED ORDER — ATORVASTATIN CALCIUM 40 MG PO TABS
40.0000 mg | ORAL_TABLET | Freq: Every day | ORAL | Status: DC
  Administered 2021-07-08 – 2021-07-09 (×2): 40 mg via ORAL
  Filled 2021-07-08 (×2): qty 1

## 2021-07-08 MED ORDER — SODIUM CHLORIDE 0.9 % IV SOLN
8.0000 mg/kg | INTRAVENOUS | Status: DC
  Administered 2021-07-08: 550 mg via INTRAVENOUS
  Filled 2021-07-08 (×2): qty 11

## 2021-07-08 MED ORDER — DAPTOMYCIN 500 MG IV SOLR
8.0000 mg/kg | Freq: Every day | INTRAVENOUS | Status: DC
  Filled 2021-07-08: qty 11

## 2021-07-08 MED ORDER — CHLORHEXIDINE GLUCONATE CLOTH 2 % EX PADS
6.0000 | MEDICATED_PAD | Freq: Every day | CUTANEOUS | Status: DC
Start: 2021-07-09 — End: 2021-07-10
  Administered 2021-07-09 – 2021-07-10 (×2): 6 via TOPICAL

## 2021-07-08 MED ORDER — VANCOMYCIN VARIABLE DOSE PER UNSTABLE RENAL FUNCTION (PHARMACIST DOSING): Status: DC

## 2021-07-08 MED ORDER — SODIUM CHLORIDE 0.9 % IV SOLN
INTRAVENOUS | Status: DC | PRN
Start: 2021-07-08 — End: 2021-07-10

## 2021-07-08 MED ORDER — MUPIROCIN 2 % EX OINT
1.0000 "application " | TOPICAL_OINTMENT | Freq: Two times a day (BID) | CUTANEOUS | Status: DC
  Administered 2021-07-09 – 2021-07-10 (×4): 1 via NASAL
  Filled 2021-07-08: qty 22

## 2021-07-08 MED ORDER — ASPIRIN 81 MG PO TBEC: 81.0000 mg | DELAYED_RELEASE_TABLET | Freq: Every day | ORAL | Status: DC

## 2021-07-08 NOTE — Progress Notes (Signed)
   07/08/21 0829  Assess: MEWS Score  Temp 98 F (36.7 C)  BP 99/65  Pulse Rate 90  ECG Heart Rate 91  Resp (!) 24  SpO2 98 %  Assess: MEWS Score  MEWS Temp 0  MEWS Systolic 1  MEWS Pulse 0  MEWS RR 1  MEWS LOC 1  MEWS Score 3  MEWS Score Color Yellow  Assess: if the MEWS score is Yellow or Red  Were vital signs taken at a resting state? Yes  Focused Assessment No change from prior assessment  Early Detection of Sepsis Score *See Row Information* Low  MEWS guidelines implemented *See Row Information* Yes  Treat  MEWS Interventions Other (Comment) (no new interventions at this time)  Take Vital Signs  Increase Vital Sign Frequency  Yellow: Q 2hr X 2 then Q 4hr X 2, if remains yellow, continue Q 4hrs  Escalate  MEWS: Escalate Yellow: discuss with charge nurse/RN and consider discussing with provider and RRT  Notify: Charge Nurse/RN  Name of Charge Nurse/RN Notified Brooke RN  Date Charge Nurse/RN Notified 07/08/21  Time Charge Nurse/RN Notified 1037  Notify: Provider  Provider Name/Title DR. Dwyane Dee  Date Provider Notified 07/08/21  Time Provider Notified 1040  Method of Notification Page  Notification Reason Other (Comment) (yellow MEWS)  Provider response No new orders  Date of Provider Response 07/08/21  Time of Provider Response 1040  Document  Patient Outcome Other (Comment) (remains stable)  Progress note created (see row info) Yes

## 2021-07-08 NOTE — Progress Notes (Addendum)
Progress Note  Patient Name: Corey Gonzalez Date of Encounter: 07/08/2021  [facility] Cardiologist: None   Subjective   Complains of upper back pain and pleuritic chest pain this morning.   Inpatient Medications    Scheduled Meds:  vitamin C  500 mg Oral Daily   atorvastatin  20 mg Oral QHS   docusate sodium  100 mg Oral BID   DULoxetine  20 mg Oral Daily   ferrous sulfate  325 mg Oral Q breakfast   insulin aspart  0-6 Units Subcutaneous TID WC   insulin aspart  3 Units Subcutaneous TID WC   melatonin  3 mg Oral QHS   pantoprazole  40 mg Oral Daily   polyethylene glycol  17 g Oral Daily   sodium chloride flush  3 mL Intravenous Q12H   tamsulosin  0.4 mg Oral Daily   vancomycin variable dose per unstable renal function (pharmacist dosing)   Does not apply See admin instructions   Continuous Infusions:  ceFEPime (MAXIPIME) IV 2 g (07/08/21 0837)   heparin 450 Units/hr (07/08/21 0830)   metronidazole Stopped (07/08/21 0458)   PRN Meds: acetaminophen **OR** acetaminophen, albuterol, bisacodyl, HYDROcodone-acetaminophen, magic mouthwash w/lidocaine, ondansetron **OR** ondansetron (ZOFRAN) IV, senna-docusate   Vital Signs    Vitals:   07/08/21 0449 07/08/21 0512 07/08/21 0803 07/08/21 0829  BP: 94/67 102/63 109/67 99/65  Pulse: 87 86 93 90  Resp: 11 18 20  (!) 24  Temp: 97.6 F (36.4 C)  98 F (36.7 C)   TempSrc: Oral  Oral   SpO2: 98% 99% 96% 98%  Weight: 71.4 kg     Height:        Intake/Output Summary (Last 24 hours) at 07/08/2021 0902 Last data filed at 07/08/2021 0830 Gross per 24 hour  Intake 2431.53 ml  Output 225 ml  Net 2206.53 ml      07/08/2021    4:49 AM 07/07/2021   12:38 PM 07/07/2021    4:04 AM  Last 3 Weights  Weight (lbs) 157 lb 6.5 oz 106 lb 7.7 oz 106 lb 7.7 oz  Weight (kg) 71.4 kg 48.3 kg 48.3 kg      Telemetry    Sinus Rhythm - Personally Reviewed  ECG    No new tracing  Physical Exam   GEN: No acute distress.   Neck: No  JVD Cardiac: RRR, no murmurs, rubs, or gallops.  Respiratory: Clear to auscultation bilaterally. GI: Soft, nontender, non-distended  MS: No edema; No deformity. Neuro:  Nonfocal  Psych: Normal affect   Labs    High Sensitivity Troponin:   Recent Labs  Lab 07/07/21 0347 07/07/21 0608 07/07/21 1134  TROPONINIHS 8,353* 7,977* 10,105*     Chemistry Recent Labs  Lab 07/07/21 0347 07/08/21 0133  NA 129* 130*  K 4.0 4.6  CL 99 102  CO2 20* 18*  GLUCOSE 215* 117*  BUN 36* 45*  CREATININE 2.43* 3.20*  CALCIUM 8.0* 8.0*  MG 1.5*  --   PROT 6.1* 5.6*  ALBUMIN 2.6* 2.2*  AST 92* 85*  ALT 33 27  ALKPHOS 80 76  BILITOT 2.0* 1.7*  GFRNONAA 26* 19*  ANIONGAP 10 10    Lipids No results for input(s): CHOL, TRIG, HDL, LABVLDL, LDLCALC, CHOLHDL in the last 168 hours.  Hematology Recent Labs  Lab 07/07/21 0347 07/08/21 0133  WBC 10.9* 10.2  RBC 2.75* 2.64*  HGB 9.4* 8.8*  HCT 27.0* 26.3*  MCV 98.2 99.6  MCH 34.2* 33.3  MCHC  34.8 33.5  RDW 20.1* 20.7*  PLT 79* 51*   Thyroid No results for input(s): TSH, FREET4 in the last 168 hours.  BNPNo results for input(s): BNP, PROBNP in the last 168 hours.  DDimer No results for input(s): DDIMER in the last 168 hours.   Radiology    DG Chest Port 1 View  Result Date: 07/07/2021 CLINICAL DATA:  Questionable sepsis. EXAM: PORTABLE CHEST 1 VIEW COMPARISON:  12/30/2020. FINDINGS: The heart is enlarged and the mediastinal contours within normal limits. Atherosclerotic calcification of the aorta is noted. The distal tip of right chest port terminates at the cavoatrial junction. Lung volumes are low. Mild atelectasis is noted at the lung bases bilaterally. There is a trace right pleural effusion. No pneumothorax. No acute osseous abnormality. IMPRESSION: 1. Low lung volumes with mild atelectasis at the lung bases. 2. Trace right pleural effusion. 3. Cardiomegaly. Electronically Signed   By: Brett Fairy M.D.   On: 07/07/2021 04:18    ECHOCARDIOGRAM COMPLETE  Result Date: 07/07/2021    ECHOCARDIOGRAM REPORT   Patient Name:   Corey Gonzalez Date of Exam: 07/07/2021 Medical Rec #:  [id number]    Height:       67.0 in Accession #:    [id number]   Weight:       106.5 lb Date of Birth:  1940-03-16     BSA:          1.547 m Patient Age:    81 years     BP:           99/68 mmHg Patient Gender: M            HR:           93 bpm. Exam Location:  Inpatient Procedure: 2D Echo, Cardiac Doppler and Color Doppler Indications:    Elevated troponin  History:        Patient has no prior history of Echocardiogram examinations.  Sonographer:    Perry Referring Phys: [id number] [place]  1. Left ventricular ejection fraction, by estimation, is 45-50%. The left ventricle is mildly decreased. The left ventricle demonstrates regional wall motion abnormalities (mid-apical septal hypokinesis). 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 Left ventricular diastolic parameters are consistent with Grade I diastolic dysfunction (impaired relaxation).  2. Right ventricular systolic function is normal. The right ventricular size is mildly enlarged.  3. The mitral valve is grossly normal. Mild to moderate mitral valve regurgitation. Moderate mitral annular calcification. Pulmonary vein blunting, there are two jets.  4. Tricuspid valve regurgitation is mild-moderate, eccentric, and functional.  5. The aortic valve is tricuspid. Aortic valve regurgitation is not visualized. No aortic stenosis is present. Comparison(s): No prior Echocardiogram. FINDINGS  Left Ventricle: Left ventricular ejection fraction, by estimation, is 45 to 50%. The left ventricle has mildly decreased function. The left ventricle demonstrates regional wall motion abnormalities. The left ventricular internal cavity size was normal in size. 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 Left ventricular  diastolic parameters are consistent with Grade I diastolic dysfunction (impaired relaxation).  LV Wall Scoring: The mid and distal anterior septum and mid inferoseptal segment are hypokinetic. Right Ventricle: The right ventricular size is mildly enlarged. No increase in right ventricular wall thickness. Right ventricular systolic function is normal. Left Atrium: Left atrial size was normal in size. Right Atrium: Right atrial size was normal in size. Pericardium: There is no evidence of pericardial effusion.  Mitral Valve: The mitral valve is grossly normal. Moderate mitral annular calcification. Mild to moderate mitral valve regurgitation. Tricuspid Valve: Eccentric. The tricuspid valve is normal in structure. Tricuspid valve regurgitation is mild to moderate. Aortic Valve: The aortic valve is tricuspid. Aortic valve regurgitation is not visualized. No aortic stenosis is present. Aortic valve mean gradient measures 4.0 mmHg. Aortic valve peak gradient measures 8.0 mmHg. Aortic valve area, by VTI measures 2.02 cm. Pulmonic Valve: The pulmonic valve was not well visualized. Pulmonic valve regurgitation is not visualized. No evidence of pulmonic stenosis. Aorta: The aortic root and ascending aorta are structurally normal, with no evidence of dilitation. IAS/Shunts: No atrial level shunt detected by color flow Doppler.  LEFT VENTRICLE PLAX 2D LVIDd:         4.30 cm     Diastology LVIDs:         3.30 cm     LV e' medial:    6.09 cm/s LV PW:         1.30 cm     LV E/e' medial:  15.3 LV IVS:        1.60 cm     LV e' lateral:   8.16 cm/s LVOT diam:     2.20 cm     LV E/e' lateral: 11.4 LV SV:         49 LV SV Index:   32 LVOT Area:     3.80 cm  LV Volumes (MOD) LV vol d, MOD A2C: 92.7 ml LV vol d, MOD A4C: 92.5 ml LV vol s, MOD A2C: 45.6 ml LV vol s, MOD A4C: 33.0 ml LV SV MOD A2C:     47.1 ml LV SV MOD A4C:     92.5 ml LV SV MOD BP:      53.9 ml RIGHT VENTRICLE RV Basal diam:  4.40 cm RV Mid diam:    4.00 cm RV S prime:      7.51 cm/s TAPSE (M-mode): 1.7 cm LEFT ATRIUM             Index        RIGHT ATRIUM           Index LA diam:        2.30 cm 1.49 cm/m   RA Area:     17.60 cm LA Vol (A2C):   32.6 ml 21.07 ml/m  RA Volume:   39.50 ml  25.53 ml/m LA Vol (A4C):   40.0 ml 25.86 ml/m LA Biplane Vol: 38.2 ml 24.69 ml/m  AORTIC VALVE                    PULMONIC VALVE AV Area (Vmax):    1.97 cm     PV Vmax:       0.86 m/s AV Area (Vmean):   1.90 cm     PV Peak grad:  3.0 mmHg AV Area (VTI):     2.02 cm AV Vmax:           141.00 cm/s AV Vmean:          95.800 cm/s AV VTI:            0.243 m AV Peak Grad:      8.0 mmHg AV Mean Grad:      4.0 mmHg LVOT Vmax:         73.20 cm/s LVOT Vmean:        47.900 cm/s LVOT VTI:  0.129 m LVOT/AV VTI ratio: 0.53  AORTA Ao Root diam: 3.20 cm Ao Asc diam:  3.40 cm MITRAL VALVE                TRICUSPID VALVE MV Area (PHT): 7.74 cm     TR Peak grad:   30.9 mmHg MV Decel Time: 98 msec      TR Vmax:        278.00 cm/s MR Peak grad: 78.5 mmHg MR Mean grad: 50.0 mmHg     SHUNTS MR Vmax:      443.00 cm/s   Systemic VTI:  0.13 m MR Vmean:     328.0 cm/s    Systemic Diam: 2.20 cm MV E velocity: 93.10 cm/s MV A velocity: 108.00 cm/s MV E/A ratio:  0.86 Rudean Haskell MD Electronically signed by Rudean Haskell MD Signature Date/Time: 07/07/2021/12:18:58 PM    Final    CT CHEST ABDOMEN PELVIS WO CONTRAST  Result Date: 07/07/2021 CLINICAL DATA:  Chest wall pain, nontraumatic, with infection or inflammation suspected. Fever EXAM: CT CHEST, ABDOMEN AND PELVIS WITHOUT CONTRAST TECHNIQUE: Multidetector CT imaging of the chest, abdomen and pelvis was performed following the standard protocol without IV contrast. RADIATION DOSE REDUCTION: This exam was performed according to the departmental dose-optimization program which includes automated exposure control, adjustment of the mA and/or kV according to patient size and/or use of iterative reconstruction technique. COMPARISON:  None Available.  FINDINGS: CT CHEST FINDINGS Cardiovascular: Normal heart size. No pericardial effusion. Extensive atheromatous calcification of the aorta and coronaries. Right-sided porta catheter with tip at the upper cavoatrial junction. Mediastinum/Nodes: Negative for mass or adenopathy Lungs/Pleura: Dependent atelectasis. There is no edema, consolidation, effusion, or pneumothorax. Musculoskeletal: No acute finding. Gynecomastia. CT ABDOMEN PELVIS FINDINGS Hepatobiliary: Lobulated liver with large caudate lobe and fissures. Cholelithiasis without findings of acute cholecystitis. Pancreas: Generalized atrophy. Spleen: Mildly enlarged with 14 cm craniocaudal span. Adrenals/Urinary Tract: 2.8 cm right adrenal mass. Heterogeneity of renal cortex with patchy high-density areas bilaterally. Contrast is being excreted from unknown prior study. Negative bladder. Stomach/Bowel: Descending colostomy. Negative for bowel obstruction or visible inflammation. Vascular/Lymphatic: Atheromatous calcification of the aorta and branch vessels. 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 IMPRESSION: 1. No acute finding. 2. 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 History of lung cancer, recommend follow-up with prior staging scans. 3. Recent contrast administration from unknown procedure. Patchy renal cortical density could be from ATN or scarring. 4. Cirrhotic appearance of the liver. Please correlate for risk factors. 5.  Aortic Atherosclerosis (ICD10-I70.0). 6. Cholelithiasis. Electronically Signed   By: Jorje Guild M.D.   On: 07/07/2021 06:03   US Abdomen Limited RUQ (LIVER/GB)  Result Date: 07/07/2021 CLINICAL DATA:  Evaluate for choledocholithiasis. EXAM: ULTRASOUND ABDOMEN LIMITED RIGHT UPPER QUADRANT COMPARISON:  CT 07/07/2021 FINDINGS: Gallbladder:  Large stone within the gallbladder measures 1.4 cm. Debris/sludge noted within the gallbladder. Mild gallbladder wall thickening measures 4.4 mm. Negative sonographic Murphy's sign. Common bile duct: Diameter: 5 mm Liver: Increased parenchymal echogenicity. Portal vein is patent on color Doppler imaging with normal direction of blood flow towards the liver. Other: None. IMPRESSION: 1. Gallstone and sludge noted within gallbladder. Mild gallbladder wall thickening. Note: In the setting of cirrhosis (as suggested on CT from earlier today), gallbladder wall thickening may be a nonspecific finding. If there is a high suspicion for acute cholecystitis consider  further investigation with nuclear medicine hepatic biliary scan. 2. Increased parenchymal echogenicity suggestive of hepatic steatosis. 3. No biliary ductal dilatation identified. Electronically Signed   By: Kerby Moors M.D.   On: 07/07/2021 10:04    Cardiac Studies   Echo: 07/07/21  IMPRESSIONS     1. Left ventricular ejection fraction, by estimation, is 45-50%. The left  ventricle is mildly decreased. The left ventricle demonstrates regional  wall motion abnormalities (mid-apical septal hypokinesis). There is severe  asymmetric left ventricular  hypertrophy of the basal-septal segment. Left ventricular diastolic  parameters are consistent with Grade I diastolic dysfunction (impaired  relaxation).   2. Right ventricular systolic function is normal. The right ventricular  size is mildly enlarged.   3. The mitral valve is grossly normal. Mild to moderate mitral valve  regurgitation. Moderate mitral annular calcification. Pulmonary vein  blunting, there are two jets.   4. Tricuspid valve regurgitation is mild-moderate, eccentric, and  functional.   5. The aortic valve is tricuspid. Aortic valve regurgitation is not  visualized. No aortic stenosis is present.   Comparison(s): No prior Echocardiogram.   FINDINGS   Left Ventricle: Left  ventricular ejection fraction, by estimation, is 45  to 50%. The left ventricle has mildly decreased function. The left  ventricle demonstrates regional wall motion abnormalities. The left  ventricular internal cavity size was normal  in size. There is severe asymmetric left ventricular hypertrophy of the  basal-septal segment. Left ventricular diastolic parameters are consistent  with Grade I diastolic dysfunction (impaired relaxation).      LV Wall Scoring:  The mid and distal anterior septum and mid inferoseptal segment are  hypokinetic.   Right Ventricle: The right ventricular size is mildly enlarged. No  increase in right ventricular wall thickness. Right ventricular systolic  function is normal.   Left Atrium: Left atrial size was normal in size.   Right Atrium: Right atrial size was normal in size.   Pericardium: There is no evidence of pericardial effusion.   Mitral Valve: The mitral valve is grossly normal. Moderate mitral annular  calcification. Mild to moderate mitral valve regurgitation.   Tricuspid Valve: Eccentric. The tricuspid valve is normal in structure.  Tricuspid valve regurgitation is mild to moderate.   Aortic Valve: The aortic valve is tricuspid. Aortic valve regurgitation is  not visualized. No aortic stenosis is present. Aortic valve mean gradient  measures 4.0 mmHg. Aortic valve peak gradient measures 8.0 mmHg. Aortic  valve area, by VTI measures 2.02  cm.   Pulmonic Valve: The pulmonic valve was not well visualized. Pulmonic valve  regurgitation is not visualized. No evidence of pulmonic stenosis.   Aorta: The aortic root and ascending aorta are structurally normal, with  no evidence of dilitation.   IAS/Shunts: No atrial level shunt detected by color flow Doppler.   Patient Profile     81 y.o. male  with a hx of Atrial fibrillation on eilquis, s/p left hip fracture in 2022, T2DM, HTN, CKD, hx of tobacco and alcohol use who was seen 07/07/2021  for the evaluation of abnormal ECG at the request of Dr. Dina Rich.  Assessment & Plan    Abnormal EKG/Chest pain/NSTEMI: initial EKG concerning for inferior ST changes. CODE STEMI canceled. Recommendations were to treat medically. hsTn [id number]>>[id number]>>[id number]. Follow up echo showed LVEF 45-50% with mid and distal anterior septum and mid inferoseptal segment hypokinesis. Not a candidate for invasive work up at this time with febrile illness, worsening renal function, thrombocytopenia, etc -- has been  on IV heparin, planned for total of 48hrs, add 81mg  ASA, continue statin. Blood pressures remain borderline soft, will defer addition of BB at this time.   Sepsis/hypotension: febrile on admission, source unclear. MRSA bacteremia -- Recent abd CT at the [place] with potential choledocholithiasis with recs for MRCP if warranted -- treated with IV antibiotics and IVFs, suspect ID to see today   AKI with CKD stage IIIa: previously Cr 1.3, Cr 2.43>>3.2 on admission. Recent CT at the [place]  DM: Hgb A1c 7.5 -- SSI while inpatient  Hx of Afib: notes indicate he was on eliquis PTA -- last dose 5/22 -- remains in sinus rhythm   Metastatic Colon Ca s/p sigmoidectomy: had PET scan on 2/23 concerning for metastatic disease with enlarging pulmonary nodule. Lung nodules not amenable to biopsy, presumed to have pulmonary metastases -- currently undergoing chemotherapy  Thrombocytopenia: Platelets 79>>51 today -- follow  Cholelithiasis: recent scan from th [place] with possible choledocholithiasis. AST 92. -- RUQ [place] with gallstone and sludge noted within gallbladder. Mild gallbladder wall thickening. Recs to consider further imaging   For questions or updates, please contact [place] Please consult [email or web address] for contact info under        Signed, Reino Bellis, NP  07/08/2021, 9:02 AM    I have examined the patient and reviewed assessment and plan and discussed with patient.  Agree with above as stated.     S/p troponin elevation- likely demand ischemia.  COmplete 48 hours of heparin.  Not a candidate for cath with worsening renal function.  Continue supportive care. Hemodynamically stable at this time.   Larae Grooms

## 2021-07-08 NOTE — Progress Notes (Signed)
ANTICOAGULATION CONSULT NOTE -  Pharmacy Consult for Heparin Indication: chest pain/ACS and atrial fibrillation   No Known Allergies  Patient Measurements: Height: 5\' 7"  (170.2 cm) Weight: 71.4 kg (157 lb 6.5 oz) IBW/kg (Calculated) : 66.1  Vital Signs: Temp: 98 F (36.7 C) (05/24 0829) Temp Source: Oral (05/24 0829) BP: 99/65 (05/24 0829) Pulse Rate: 90 (05/24 0829)  Labs: Recent Labs    07/07/21 0347 07/07/21 0608 07/07/21 1134 07/07/21 1338 07/07/21 2230 07/08/21 0133 07/08/21 0925  HGB 9.4*  --   --   --   --  8.8*  --   HCT 27.0*  --   --   --   --  26.3*  --   PLT 79*  --   --   --   --  51*  --   APTT 41*  --   --  103* 146*  --  81*  LABPROT 24.7*  --   --   --   --   --   --   INR 2.3*  --   --   --   --   --   --   HEPARINUNFRC >1.10*  --   --   --   --  >1.10*  --   CREATININE 2.43*  --   --   --   --  3.20*  --   CKTOTAL  --   --  531*  --   --   --   --   TROPONINIHS 8,353* 7,977* 10,105*  --   --   --   --      Estimated Creatinine Clearance: 17.2 mL/min (A) (by C-G formula based on SCr of 3.2 mg/dL (H)).   Assessment: 81 y.o. male admitted with chest pain, h/o Afib and Eliquis on hold (last dose 5/22 1700), on heparin.   -aPTT at goal -hg= 8.8, plt= 50 (trend down; noted with recent Chemo)   Goal of Therapy:  aPTT 66-102 sec Heparin level 0.3-0.7 units/ml Monitor platelets by anticoagulation protocol: Yes   Plan:  -Continue heparin at 450 units/hr -aPTT in 8 hrs -Plans are for 48hrs heparin to stop on 5/25 at [place], PharmD Clinical Pharmacist **Pharmacist phone directory can now be found on [email or web address] ([email or web address]).  Listed under [place].

## 2021-07-08 NOTE — Progress Notes (Signed)
ANTICOAGULATION CONSULT NOTE -  Pharmacy Consult for Heparin Indication: chest pain/ACS and atrial fibrillation   No Known Allergies  Patient Measurements: Height: 5\' 7"  (170.2 cm) Weight: 71.4 kg (157 lb 6.5 oz) IBW/kg (Calculated) : 66.1  Vital Signs: Temp: 98 F (36.7 C) (05/24 1559) Temp Source: Oral (05/24 1559) BP: 100/60 (05/24 1559) Pulse Rate: 86 (05/24 1559)  Labs: Recent Labs    07/07/21 0347 07/07/21 0608 07/07/21 1134 07/07/21 1338 07/07/21 2230 07/08/21 0133 07/08/21 0925  HGB 9.4*  --   --   --   --  8.8*  --   HCT 27.0*  --   --   --   --  26.3*  --   PLT 79*  --   --   --   --  51*  --   APTT 41*  --   --  103* 146*  --  81*  LABPROT 24.7*  --   --   --   --   --   --   INR 2.3*  --   --   --   --   --   --   HEPARINUNFRC >1.10*  --   --   --   --  >1.10*  --   CREATININE 2.43*  --   --   --   --  3.20*  --   CKTOTAL  --   --  531*  --   --   --   --   TROPONINIHS 8,353* 7,977* 10,105*  --   --   --   --      Estimated Creatinine Clearance: 17.2 mL/min (A) (by C-G formula based on SCr of 3.2 mg/dL (H)).   Assessment: 81 y.o. male admitted with chest pain, h/o Afib and Eliquis on hold (last dose 5/22 1700), on heparin.    Aptt 91 sec (on heparin 450 units/hr)   Goal of Therapy:  aPTT 66-102 sec Heparin level 0.3-0.7 units/ml Monitor platelets by anticoagulation protocol: Yes   Plan:  Continue heparin 450 units/hr Plans are for 48hrs heparin to stop on 5/25 at 7am  Thank you for allowing pharmacy to be a part of this patient's care.  Donnald Garre, PharmD Clinical Pharmacist  Please check AMION for all [place] numbers After 10:00 PM, call [place] [phone]

## 2021-07-08 NOTE — Progress Notes (Signed)
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  Patient reported symptoms started after he has received chemotherapy.  Patient was seen as STEMI code in the ED due to ST elevation on initila EKG but then later canceled by cardiologist Dr. Burt Knack.  Patient presented with signs of severe sepsis (fever 102, HR 113, RR 40, BP 80/63 which improved after IV fluid boluses.  Patient was started on empiric antibiotics( vancomycin metronidazole and cefepime).  Cardiology is also consulted for elevated troponin and started on heparin drip.  Cardiology recommended cardiac work-up once patient is hemodynamically optimized.  Assessment & Plan:   Principal Problem:   Severe sepsis ([place]) Active Problems:   NSTEMI (non-ST elevated myocardial infarction) ([place])   Hypotension   Acute kidney injury superimposed on chronic kidney disease ([place])   Uncontrolled type 2 diabetes mellitus with hyperglycemia, with long-term current use of insulin (HCC)   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HCC)   Macrocytic anemia   Alcohol dependence (HCC)   Tobacco abuse   Gastro-esophageal reflux disease without esophagitis   Transaminitis   Hyperbilirubinemia   Thrombocytopenia (HCC)  Severe sepsis secondary to unknown source: Patient presented with fever 102.9, tachycardia, tachypnea, high lactic acid which improved with fluid boluses.  Patient noted to have intermittent productive cough, but which appears unchanged from baseline.   Chest x-ray without any abnormality.  UA: no signs of infection. Continue empiric  antibiotics(vancomycin, cefepime, Flagyl). There is a possibility of cholangitis based on CT at [place] that was done yesterday. Follow-up blood and urine cultures. Infectious disease consulted recommended IR consult for port removal. Obtain TEE to evaluate for endocarditis. MRI left knee/femur to evaluate joint or hardware for infection. Antibiotics changed from vancomycin to daptomycin given AKI. Flagyl and cefepime discontinued.  NSTEMI: Patient reports chest discomfort with some difficulty breathing. Initial labs revealed high-sensitivity troponin trended up to 10,000. Continue heparin for 48 hours. Echocardiogram shows normal inferior wall motion abnormality.   Cardiology consulted and recommended not a candidate for cath given his infection and acute renal failure.  Hypotension: Patient initially noted to have low blood pressure which improved with fluid resuscitation. Continue to monitor, continue IV hydration.  AKI on CKD stage IIIb: Baseline serum creatinine 1.4 last checked in [facility].   Serum creatinine on admission 2.3 >2.43 > 3.20 Likely secondary to nausea and vomiting after last chemotherapy. Continue IV hydration, avoid nephrotoxic medications.  Type 2 diabetes uncontrolled. Last hemoglobin A1c 7.5 Continue sliding scale.  Avoid p.o. diabetic medications.  Metastatic colon cancer: Patient with known metastatic colon cancer s/p resection currently receiving chemotherapy.   Discussed with oncologist recommended to hold chemo in the setting of acute infection.  Macrocytic anemia Chronic.  Hemoglobin 9.4 , denies any obvious visible bleeding  Hyperlipidemia: Continue atorvastatin  Hyperbilirubinemia / transaminitis: No acute finding noted on CT abdomen/ pelvis. Continue to monitor  Cholelithiasis: [facility] at [facility] had noted concern for possible choledocholelithiasis Repeat CT abdomen does not report cholelithiasis.  Thrombocytopenia: Chronic but is stable.  No  signs of any bleeding.  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  BPH: Flomax 0.4  mg daily  GERD: Continue Protonix 40 mg daily  DVT prophylaxis: Heparin gtt. Code Status: Full code Family Communication: No family at bedside Disposition Plan:   Status is: Inpatient Remains inpatient appropriate because: Admitted for severe sepsis secondary to unknown source, elevated troponin likely due to sepsis cardiology consulted, recommended patient is not stable for cardiac work-up given sepsis.   Consultants:  Cardiology Infectious diseases  Procedures: CT chest, abdomen and pelvis Antimicrobials:   Anti-infectives (From admission, onward)    Start     Dose/Rate Route Frequency Ordered Stop   07/09/21 1000  vancomycin (VANCOREADY) IVPB 500 mg/100 mL  Status:  Discontinued        500 mg 100 mL/hr over 60 Minutes Intravenous Every 48 hours 07/07/21 0705 07/08/21 0725   07/08/21 2000  DAPTOmycin (CUBICIN) 550 mg in sodium chloride 0.9 % IVPB  Status:  Discontinued        8 mg/kg  71.4 kg 122 mL/hr over 30 Minutes Intravenous Daily 07/08/21 1000 07/08/21 1427   07/08/21 2000  DAPTOmycin (CUBICIN) 550 mg in sodium chloride 0.9 % IVPB        8 mg/kg  71.4 kg 122 mL/hr over 30 Minutes Intravenous Every 48 hours 07/08/21 1427     07/08/21 0800  ceFEPIme (MAXIPIME) 2 g in sodium chloride 0.9 % 100 mL IVPB  Status:  Discontinued        2 g 200 mL/hr over 30 Minutes Intravenous Every 24 hours 07/07/21 0705 07/08/21 1206   07/08/21 0725  vancomycin variable dose per unstable renal function (pharmacist dosing)  Status:  Discontinued         Does not apply See admin instructions 07/08/21 0725 07/08/21 1000   07/07/21 1500  metroNIDAZOLE (FLAGYL) IVPB 500 mg  Status:  Discontinued        500 mg 100 mL/hr over 60 Minutes Intravenous Every 12 hours 07/07/21 0650 07/08/21 1206   07/07/21 0415  ceFEPIme (MAXIPIME) 2 g in sodium chloride 0.9 % 100 mL IVPB        2 g 200 mL/hr over 30  Minutes Intravenous  Once 07/07/21 0400 07/07/21 0505   07/07/21 0415  metroNIDAZOLE (FLAGYL) IVPB 500 mg        500 mg 100 mL/hr over 60 Minutes Intravenous  Once 07/07/21 0400 07/07/21 0545   07/07/21 0415  vancomycin (VANCOCIN) IVPB 1000 mg/200 mL premix  Status:  Discontinued        1,000 mg 200 mL/hr over 60 Minutes Intravenous  Once 07/07/21 0400 07/07/21 0402   07/07/21 0415  vancomycin (VANCOREADY) IVPB 1250 mg/250 mL        1,250 mg 166.7 mL/hr over 90 Minutes Intravenous  Once 07/07/21 0402 07/07/21 6789        Subjective: Patient was seen and examined at bedside.  Overnight events noted. Patient denies any chest pain.  Patient has significant dryness dandruff noted on face.   Objective: Vitals:   07/08/21 0803 07/08/21 0829 07/08/21 1015 07/08/21 1215  BP: 109/67 99/65 94/60  93/62  Pulse: 93 90 96 91  Resp: 20 (!) 24 18 (!) 21  Temp: 98 F (36.7 C) 98 F (36.7 C) 97.6 F (36.4 C) 97.9 F (36.6 C)  TempSrc: Oral Oral Oral Oral  SpO2: 96% 98% 98% 98%  Weight:      Height:        Intake/Output Summary (Last 24 hours) at 07/08/2021 1500 Last data filed at 07/08/2021 1217 Gross per 24 hour  Intake  2096.9 ml  Output 225 ml  Net 1871.9 ml   Filed Weights   07/07/21 0404 07/07/21 1238 07/08/21 0449  Weight: 48.3 kg 48.3 kg 71.4 kg    Examination:  General exam: Appears comfortable, not in any acute distress.  Deconditioned. Respiratory system: Clear bilaterally, no wheezing, no crackles, normal respiratory effort. Cardiovascular system: S1 & S2 heard, regular rate and rhythm, no murmur. Gastrointestinal system: Abdomen is soft, non tender, non distended, normal bowel sounds , colostomy noted.   Central nervous system: Alert and oriented x 3 . No focal neurological deficits. Extremities: No edema, no cyanosis, no clubbing. Skin: No rashes, lesions or ulcers Psychiatry: Judgement and insight appear normal. Mood & affect appropriate.     Data Reviewed: I  have personally reviewed following labs and imaging studies  CBC: Recent Labs  Lab 07/07/21 0347 07/08/21 0133  WBC 10.9* 10.2  NEUTROABS 9.4*  --   HGB 9.4* 8.8*  HCT 27.0* 26.3*  MCV 98.2 99.6  PLT 79* 51*   Basic Metabolic Panel: Recent Labs  Lab 07/07/21 0347 07/08/21 0133  NA 129* 130*  K 4.0 4.6  CL 99 102  CO2 20* 18*  GLUCOSE 215* 117*  BUN 36* 45*  CREATININE 2.43* 3.20*  CALCIUM 8.0* 8.0*  MG 1.5*  --    GFR: Estimated Creatinine Clearance: 17.2 mL/min (A) (by C-G formula based on SCr of 3.2 mg/dL (H)). Liver Function Tests: Recent Labs  Lab 07/07/21 0347 07/08/21 0133  AST 92* 85*  ALT 33 27  ALKPHOS 80 76  BILITOT 2.0* 1.7*  PROT 6.1* 5.6*  ALBUMIN 2.6* 2.2*   No results for input(s): LIPASE, AMYLASE in the last 168 hours. No results for input(s): AMMONIA in the last 168 hours. Coagulation Profile: Recent Labs  Lab 07/07/21 0347  INR 2.3*   Cardiac Enzymes: Recent Labs  Lab 07/07/21 1134  CKTOTAL 531*   BNP (last 3 results) No results for input(s): PROBNP in the last 8760 hours. HbA1C: Recent Labs    07/07/21 0347  HGBA1C 7.5*   CBG: Recent Labs  Lab 07/07/21 1002 07/07/21 1240 07/07/21 1735 07/07/21 2116 07/08/21 0802  GLUCAP 185* 226* 179* 138* 124*   Lipid Profile: No results for input(s): CHOL, HDL, LDLCALC, TRIG, CHOLHDL, LDLDIRECT in the last 72 hours. Thyroid Function Tests: No results for input(s): TSH, T4TOTAL, FREET4, T3FREE, THYROIDAB in the last 72 hours. Anemia Panel: No results for input(s): VITAMINB12, FOLATE, FERRITIN, TIBC, IRON, RETICCTPCT in the last 72 hours. Sepsis Labs: Recent Labs  Lab 07/07/21 0347 07/07/21 0608 07/07/21 0836 07/07/21 1134 07/07/21 1643 07/07/21 1903  PROCALCITON 27.09  --   --   --   --   --   LATICACIDVEN 2.0*   < > 2.6* 2.8* 3.3* 1.7   < > = values in this interval not displayed.    Recent Results (from the past 240 hour(s))  Blood Culture (routine x 2)     Status:  Abnormal (Preliminary result)   Collection Time: 07/07/21  4:00 AM   Specimen: BLOOD RIGHT HAND  Result Value Ref Range Status   Specimen Description BLOOD RIGHT HAND  Final   Special Requests   Final    BOTTLES DRAWN AEROBIC AND ANAEROBIC Blood Culture results may not be optimal due to an inadequate volume of blood received in culture bottles   Culture  Setup Time   Final    GRAM POSITIVE COCCI IN CLUSTERS IN BOTH AEROBIC AND ANAEROBIC BOTTLES CRITICAL RESULT  CALLED TO, READ BACK BY AND VERIFIED WITH: PHARMD EDEN BREWINGTON ON 07/07/21 @ 1640 BY DRT    Culture (A)  Final    STAPHYLOCOCCUS AUREUS SUSCEPTIBILITIES TO FOLLOW Performed at [facility], [place] [address], [place], [place] [address]    Report Status PENDING  Incomplete  Blood Culture ID Panel (Reflexed)     Status: Abnormal   Collection Time: 07/07/21  4:00 AM  Result Value Ref Range Status   Enterococcus faecalis NOT DETECTED NOT DETECTED Final   Enterococcus Faecium NOT DETECTED NOT DETECTED Final   Listeria monocytogenes NOT DETECTED NOT DETECTED Final   Staphylococcus species DETECTED (A) NOT DETECTED Final    Comment: CRITICAL RESULT CALLED TO, READ BACK BY AND VERIFIED WITH: PHARMD EDEN BREWINGTON ON 07/07/21 @ 1640 BY DRT    Staphylococcus aureus (BCID) DETECTED (A) NOT DETECTED Final    Comment: Methicillin (oxacillin)-resistant Staphylococcus aureus (MRSA). MRSA is predictably resistant to beta-lactam antibiotics (except ceftaroline). Preferred therapy is vancomycin unless clinically contraindicated. Patient requires contact precautions if  hospitalized. CRITICAL RESULT CALLED TO, READ BACK BY AND VERIFIED WITH: PHARMD EDEN BREWINGTON ON 07/07/21 @ 1640 BY DRT    Staphylococcus epidermidis NOT DETECTED NOT DETECTED Final   Staphylococcus lugdunensis NOT DETECTED NOT DETECTED Final   Streptococcus species NOT DETECTED NOT DETECTED Final   Streptococcus agalactiae NOT DETECTED NOT DETECTED Final    Streptococcus pneumoniae NOT DETECTED NOT DETECTED Final   Streptococcus pyogenes NOT DETECTED NOT DETECTED Final   A.calcoaceticus-baumannii NOT DETECTED NOT DETECTED Final   Bacteroides fragilis NOT DETECTED NOT DETECTED Final   Enterobacterales NOT DETECTED NOT DETECTED Final   Enterobacter cloacae complex NOT DETECTED NOT DETECTED Final   Escherichia coli NOT DETECTED NOT DETECTED Final   Klebsiella aerogenes NOT DETECTED NOT DETECTED Final   Klebsiella oxytoca NOT DETECTED NOT DETECTED Final   Klebsiella pneumoniae NOT DETECTED NOT DETECTED Final   Proteus species NOT DETECTED NOT DETECTED Final   Salmonella species NOT DETECTED NOT DETECTED Final   Serratia marcescens NOT DETECTED NOT DETECTED Final   Haemophilus influenzae NOT DETECTED NOT DETECTED Final   Neisseria meningitidis NOT DETECTED NOT DETECTED Final   Pseudomonas aeruginosa NOT DETECTED NOT DETECTED Final   Stenotrophomonas maltophilia NOT DETECTED NOT DETECTED Final   Candida albicans NOT DETECTED NOT DETECTED Final   Candida auris NOT DETECTED NOT DETECTED Final   Candida glabrata NOT DETECTED NOT DETECTED Final   Candida krusei NOT DETECTED NOT DETECTED Final   Candida parapsilosis NOT DETECTED NOT DETECTED Final   Candida tropicalis NOT DETECTED NOT DETECTED Final   Cryptococcus neoformans/gattii NOT DETECTED NOT DETECTED Final   Meth resistant mecA/C and MREJ DETECTED (A) NOT DETECTED Final    Comment: CRITICAL RESULT CALLED TO, READ BACK BY AND VERIFIED WITH: PHARMD EDEN BREWINGTON ON 07/07/21 @ 1640 BY DRT Performed at [facility], [address], [place], [place] [address]   Resp Panel by RT-PCR (Flu A&B, Covid) Nasopharyngeal Swab     Status: None   Collection Time: 07/07/21  4:06 AM   Specimen: Nasopharyngeal Swab; Nasopharyngeal(NP) swabs in vial transport medium  Result Value Ref Range Status   SARS Coronavirus 2 by RT PCR NEGATIVE NEGATIVE Final    Comment: (NOTE) SARS-CoV-2 target nucleic acids  are NOT DETECTED.  The SARS-CoV-2 RNA is generally detectable in upper respiratory specimens during the acute phase of infection. The lowest concentration of SARS-CoV-2 viral copies this assay can detect is 138 copies/mL. A negative  result does not preclude SARS-Cov-2 infection and should not be used as the sole basis for treatment or other patient management decisions. A negative result may occur with  improper specimen collection/handling, submission of specimen other than nasopharyngeal swab, presence of viral mutation(s) within the areas targeted by this assay, and inadequate number of viral copies(<138 copies/mL). A negative result must be combined with clinical observations, patient history, and epidemiological information. The expected result is Negative.  Fact Sheet for Patients:  EntrepreneurPulse.com.au  Fact Sheet for Healthcare Providers:  IncredibleEmployment.be  This test is no t yet approved or cleared by the [place] FDA and  has been authorized for detection and/or diagnosis of SARS-CoV-2 by FDA under an Emergency Use Authorization (EUA). This EUA will remain  in effect (meaning this test can be used) for the duration of the COVID-19 declaration under Section 564(b)(1) of the Act, 21 U.S.C.section 360bbb-3(b)(1), unless the authorization is terminated  or revoked sooner.       Influenza A by PCR NEGATIVE NEGATIVE Final   Influenza B by PCR NEGATIVE NEGATIVE Final    Comment: (NOTE) The Xpert Xpress SARS-CoV-2/FLU/RSV plus assay is intended as an aid in the diagnosis of influenza from Nasopharyngeal swab specimens and should not be used as a sole basis for treatment. Nasal washings and aspirates are unacceptable for Xpert Xpress SARS-CoV-2/FLU/RSV testing.  Fact Sheet for Patients: EntrepreneurPulse.com.au  Fact Sheet for Healthcare Providers: IncredibleEmployment.be  This test is  not yet approved or cleared by the [place] FDA and has been authorized for detection and/or diagnosis of SARS-CoV-2 by FDA under an Emergency Use Authorization (EUA). This EUA will remain in effect (meaning this test can be used) for the duration of the COVID-19 declaration under Section 564(b)(1) of the Act, 21 U.S.C. section 360bbb-3(b)(1), unless the authorization is terminated or revoked.  Performed at [facility], [place] [address] [place], [place] [address]   Blood Culture (routine x 2)     Status: Abnormal (Preliminary result)   Collection Time: 07/07/21  4:15 AM   Specimen: BLOOD  Result Value Ref Range Status   Specimen Description BLOOD LEFT ANTECUBITAL  Final   Special Requests   Final    BOTTLES DRAWN AEROBIC AND ANAEROBIC Blood Culture adequate volume   Culture  Setup Time   Final    GRAM POSITIVE COCCI IN CLUSTERS IN BOTH AEROBIC AND ANAEROBIC BOTTLES CRITICAL VALUE NOTED.  VALUE IS CONSISTENT WITH PREVIOUSLY REPORTED AND CALLED VALUE. CRITICAL RESULT CALLED TO, READ BACK BY AND VERIFIED WITH: Lorin Mercy [id number] @1729  FH   Performed at [facility], [place] [address] [place], [place] [address]    Culture STAPHYLOCOCCUS AUREUS (A)  Final   Report Status PENDING  Incomplete  Urine Culture     Status: None   Collection Time: 07/07/21  7:42 AM   Specimen: In/Out Cath Urine  Result Value Ref Range Status   Specimen Description IN/OUT CATH URINE  Final   Special Requests NONE  Final   Culture   Final    NO GROWTH Performed at [facility], [place] [address] [place], [place] [address]    Report Status 07/08/2021 FINAL  Final         Radiology Studies: DG Chest Port 1 View  Result Date: 07/07/2021 CLINICAL DATA:  Questionable sepsis. EXAM: PORTABLE CHEST 1 VIEW COMPARISON:  12/30/2020. FINDINGS: The heart is enlarged and the mediastinal contours within normal limits. Atherosclerotic calcification of the aorta is  noted. The distal tip of  right chest port terminates at the cavoatrial junction. Lung volumes are low. Mild atelectasis is noted at the lung bases bilaterally. There is a trace right pleural effusion. No pneumothorax. No acute osseous abnormality. IMPRESSION: 1. Low lung volumes with mild atelectasis at the lung bases. 2. Trace right pleural effusion. 3. Cardiomegaly. Electronically Signed   By: Brett Fairy M.D.   On: 07/07/2021 04:18   ECHOCARDIOGRAM COMPLETE  Result Date: 07/07/2021    ECHOCARDIOGRAM REPORT   Patient Name:   Corey Gonzalez Date of Exam: 07/07/2021 Medical Rec #:  [id number]    Height:       67.0 in Accession #:    [id number]   Weight:       106.5 lb Date of Birth:  07/28/1940     BSA:          1.547 m Patient Age:    49 years     BP:           99/68 mmHg Patient Gender: M            HR:           93 bpm. Exam Location:  Inpatient Procedure: 2D Echo, Cardiac Doppler and Color Doppler Indications:    Elevated troponin  History:        Patient has no prior history of Echocardiogram examinations.  Sonographer:    Mar[place] Referring Phys: [id number] [place]  1. Left ventricular ejection fraction, by estimation, is 45-50%. The left ventricle is mildly decreased. The left ventricle demonstrates regional wall motion abnormalities (mid-apical septal hypokinesis). 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 Left ventricular diastolic parameters are consistent with Grade I diastolic dysfunction (impaired relaxation).  2. Right ventricular systolic function is normal. The right ventricular size is mildly enlarged.  3. The mitral valve is grossly normal. Mild to moderate mitral valve regurgitation. Moderate mitral annular calcification. Pulmonary vein blunting, there are two jets.  4. Tricuspid valve regurgitation is mild-moderate, eccentric, and functional.  5. The aortic valve is tricuspid. Aortic valve regurgitation is not visualized. No aortic stenosis is  present. Comparison(s): No prior Echocardiogram. FINDINGS  Left Ventricle: Left ventricular ejection fraction, by estimation, is 45 to 50%. The left ventricle has mildly decreased function. The left ventricle demonstrates regional wall motion abnormalities. The left ventricular internal cavity size was normal in size. 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 Left ventricular diastolic parameters are consistent with Grade I diastolic dysfunction (impaired relaxation).  LV Wall Scoring: The mid and distal anterior septum and mid inferoseptal segment are hypokinetic. Right Ventricle: The right ventricular size is mildly enlarged. No increase in right ventricular wall thickness. Right ventricular systolic function is normal. Left Atrium: Left atrial size was normal in size. Right Atrium: Right atrial size was normal in size. Pericardium: There is no evidence of pericardial effusion. Mitral Valve: The mitral valve is grossly normal. Moderate mitral annular calcification. Mild to moderate mitral valve regurgitation. Tricuspid Valve: Eccentric. The tricuspid valve is normal in structure. Tricuspid valve regurgitation is mild to moderate. Aortic Valve: The aortic valve is tricuspid. Aortic valve regurgitation is not visualized. No aortic stenosis is present. Aortic valve mean gradient measures 4.0 mmHg. Aortic valve peak gradient measures 8.0 mmHg. Aortic valve area, by VTI measures 2.02 cm. Pulmonic Valve: The pulmonic valve was not well visualized. Pulmonic valve regurgitation is not visualized. No evidence of pulmonic  stenosis. Aorta: The aortic root and ascending aorta are structurally normal, with no evidence of dilitation. IAS/Shunts: No atrial level shunt detected by color flow Doppler.  LEFT VENTRICLE PLAX 2D LVIDd:         4.30 cm     Diastology LVIDs:         3.30 cm     LV e' medial:    6.09 cm/s LV PW:         1.30 cm     LV E/e' medial:  15.3 LV IVS:        1.60 cm      LV e' lateral:   8.16 cm/s LVOT diam:     2.20 cm     LV E/e' lateral: 11.4 LV SV:         49 LV SV Index:   32 LVOT Area:     3.80 cm  LV Volumes (MOD) LV vol d, MOD A2C: 92.7 ml LV vol d, MOD A4C: 92.5 ml LV vol s, MOD A2C: 45.6 ml LV vol s, MOD A4C: 33.0 ml LV SV MOD A2C:     47.1 ml LV SV MOD A4C:     92.5 ml LV SV MOD BP:      53.9 ml RIGHT VENTRICLE RV Basal diam:  4.40 cm RV Mid diam:    4.00 cm RV S prime:     7.51 cm/s TAPSE (M-mode): 1.7 cm LEFT ATRIUM             Index        RIGHT ATRIUM           Index LA diam:        2.30 cm 1.49 cm/m   RA Area:     17.60 cm LA Vol (A2C):   32.6 ml 21.07 ml/m  RA Volume:   39.50 ml  25.53 ml/m LA Vol (A4C):   40.0 ml 25.86 ml/m LA Biplane Vol: 38.2 ml 24.69 ml/m  AORTIC VALVE                    PULMONIC VALVE AV Area (Vmax):    1.97 cm     PV Vmax:       0.86 m/s AV Area (Vmean):   1.90 cm     PV Peak grad:  3.0 mmHg AV Area (VTI):     2.02 cm AV Vmax:           141.00 cm/s AV Vmean:          95.800 cm/s AV VTI:            0.243 m AV Peak Grad:      8.0 mmHg AV Mean Grad:      4.0 mmHg LVOT Vmax:         73.20 cm/s LVOT Vmean:        47.900 cm/s LVOT VTI:          0.129 m LVOT/AV VTI ratio: 0.53  AORTA Ao Root diam: 3.20 cm Ao Asc diam:  3.40 cm MITRAL VALVE                TRICUSPID VALVE MV Area (PHT): 7.74 cm     TR Peak grad:   30.9 mmHg MV Decel Time: 98 msec      TR Vmax:        278.00 cm/s MR Peak grad: 78.5 mmHg MR Mean grad: 50.0 mmHg     SHUNTS MR Vmax:  443.00 cm/s   Systemic VTI:  0.13 m MR Vmean:     328.0 cm/s    Systemic Diam: 2.20 cm MV E velocity: 93.10 cm/s MV A velocity: 108.00 cm/s MV E/A ratio:  0.86 Rudean Haskell MD Electronically signed by Rudean Haskell MD Signature Date/Time: 07/07/2021/12:18:58 PM    Final    CT CHEST ABDOMEN PELVIS WO CONTRAST  Result Date: 07/07/2021 CLINICAL DATA:  Chest wall pain, nontraumatic, with infection or inflammation suspected. Fever EXAM: CT CHEST, ABDOMEN AND PELVIS WITHOUT  CONTRAST TECHNIQUE: Multidetector CT imaging of the chest, abdomen and pelvis was performed following the standard protocol without IV contrast. RADIATION DOSE REDUCTION: This exam was performed according to the departmental dose-optimization program which includes automated exposure control, adjustment of the mA and/or kV according to patient size and/or use of iterative reconstruction technique. COMPARISON:  None Available. FINDINGS: CT CHEST FINDINGS Cardiovascular: Normal heart size. No pericardial effusion. Extensive atheromatous calcification of the aorta and coronaries. Right-sided porta catheter with tip at the upper cavoatrial junction. Mediastinum/Nodes: Negative for mass or adenopathy Lungs/Pleura: Dependent atelectasis. There is no edema, consolidation, effusion, or pneumothorax. Musculoskeletal: No acute finding. Gynecomastia. CT ABDOMEN PELVIS FINDINGS Hepatobiliary: Lobulated liver with large caudate lobe and fissures. Cholelithiasis without findings of acute cholecystitis. Pancreas: Generalized atrophy. Spleen: Mildly enlarged with 14 cm craniocaudal span. Adrenals/Urinary Tract: 2.8 cm right adrenal mass. Heterogeneity of renal cortex with patchy high-density areas bilaterally. Contrast is being excreted from unknown prior study. Negative bladder. Stomach/Bowel: Descending colostomy. Negative for bowel obstruction or visible inflammation. Vascular/Lymphatic: Atheromatous calcification of the aorta and branch vessels. 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 IMPRESSION: 1. No acute finding. 2. 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 History of lung cancer, recommend follow-up with prior staging scans. 3. Recent contrast administration from unknown procedure. Patchy renal cortical density could be from ATN or  scarring. 4. Cirrhotic appearance of the liver. Please correlate for risk factors. 5.  Aortic Atherosclerosis (ICD10-I70.0). 6. Cholelithiasis. Electronically Signed   By: Jorje Guild M.D.   On: 07/07/2021 06:03   US Abdomen Limited RUQ (LIVER/GB)  Result Date: 07/07/2021 CLINICAL DATA:  Evaluate for choledocholithiasis. EXAM: ULTRASOUND ABDOMEN LIMITED RIGHT UPPER QUADRANT COMPARISON:  CT 07/07/2021 FINDINGS: Gallbladder: Large stone within the gallbladder measures 1.4 cm. Debris/sludge noted within the gallbladder. Mild gallbladder wall thickening measures 4.4 mm. Negative sonographic Murphy's sign. Common bile duct: Diameter: 5 mm Liver: Increased parenchymal echogenicity. Portal vein is patent on color Doppler imaging with normal direction of blood flow towards the liver. Other: None. IMPRESSION: 1. Gallstone and sludge noted within gallbladder. Mild gallbladder wall thickening. Note: In the setting of cirrhosis (as suggested on CT from earlier today), gallbladder wall thickening may be a nonspecific finding. If there is a high suspicion for acute cholecystitis consider further investigation with nuclear medicine hepatic biliary scan. 2. Increased parenchymal echogenicity suggestive of hepatic steatosis. 3. No biliary ductal dilatation identified. Electronically Signed   By: Kerby Moors M.D.   On: 07/07/2021 10:04    Scheduled Meds:  vitamin C  500 mg Oral Daily   atorvastatin  40 mg Oral QHS   docusate sodium  100 mg Oral BID   DULoxetine  20 mg Oral Daily   ferrous sulfate  325 mg Oral Q breakfast   insulin aspart  0-6 Units Subcutaneous TID WC   insulin  aspart  3 Units Subcutaneous TID WC   melatonin  3 mg Oral QHS   pantoprazole  40 mg Oral Daily   polyethylene glycol  17 g Oral Daily   sodium chloride flush  3 mL Intravenous Q12H   tamsulosin  0.4 mg Oral Daily   Continuous Infusions:  DAPTOmycin (CUBICIN)  IV     heparin 450 Units/hr (07/08/21 1217)     LOS: 1 day    Time  spent: 50 mins    Kesley Mullens, MD [facility]   If 7PM-7AM, please contact night-coverage

## 2021-07-08 NOTE — Progress Notes (Signed)
ANTICOAGULATION CONSULT NOTE -  Pharmacy Consult for Heparin Indication: chest pain/ACS and atrial fibrillation Brief A/P: aPTT supratherapeutic Decrease Heparin rate  No Known Allergies  Patient Measurements: Height: 5\' 7"  (170.2 cm) Weight: 48.3 kg (106 lb 7.7 oz) IBW/kg (Calculated) : 66.1  Vital Signs: Temp: 98.3 F (36.8 C) (05/24 0025) Temp Source: Oral (05/24 0025) BP: 99/64 (05/24 0025) Pulse Rate: 92 (05/24 0025)  Labs: Recent Labs    07/07/21 0347 07/07/21 0608 07/07/21 1134 07/07/21 1338 07/07/21 2230  HGB 9.4*  --   --   --   --   HCT 27.0*  --   --   --   --   PLT 79*  --   --   --   --   APTT 41*  --   --  103* 146*  LABPROT 24.7*  --   --   --   --   INR 2.3*  --   --   --   --   HEPARINUNFRC >1.10*  --   --   --   --   CREATININE 2.43*  --   --   --   --   CKTOTAL  --   --  531*  --   --   TROPONINIHS 8,353* 7,977* 10,105*  --   --      Estimated Creatinine Clearance: 16.6 mL/min (A) (by C-G formula based on SCr of 2.43 mg/dL (H)).   Assessment: 81 y.o. male admitted with chest pain, h/o Afib and Eliquis on hold, for heparin.     Goal of Therapy:  aPTT 66-102 sec Heparin level 0.3-0.7 units/ml Monitor platelets by anticoagulation protocol: Yes   Plan:  Decrease Heparin 450 units/hr Check aPTT in 8 hours   Jazzlyn Huizenga, Bronson Curb 07/08/2021,2:11 AM

## 2021-07-08 NOTE — Consult Note (Addendum)
[facility] for Infectious Disease    Date of Admission:  07/07/2021     Total days of antibiotics 1  Vancomycin   Cefepime   Flagyl          Reason for Consult: MRSA bacteremia     Referring Provider: auto-consult  Primary Care Provider: Wendie Agreste, NP   Assessment: Corey Gonzalez is a 81 y.o. 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 Blood cultures growing out MRSA.   According to him fevers started within 24h of last access of port for bevacizumab infusion 5/18 and has trouble with it bleeding since it was inserted about 5 weeks ago. On exam the port itself does not look great - no purulence but given MRSA in the blood I think the best course of action would be to remove this.   We do have concern over left total knee replacement as well given limited ROM, warmth and swelling.  He had an ortho FU about a month ago with signs hardware was well affixed and femoral fracture healed well; today he has a hard time flexing the knee anywhere near 90 degrees and some TTP. Will order femoral/knee MRI (w/ and w/o preferred) to assess for infection concern given MRSA bacteremia may have seeded his joint.   Acute on chronic kidney disease - CrCl > 20 now; will switch to Daptomycin 25m/kg/48h and follow CK / creatinine for recovery. Send MRSA isolate for susceptibilities to ensure no resistance.   Don't think we need to keep cefepime/flagyl on for now while working up gall bladder findings. I suspect this may be an incidental thing, however. Would not expect MRSA to be related to cholangitis process. H/O cirrhosis / [place] L[place] normal prior to hospitalization, Alk phos normal with 2x elevation of AST, TBili 2; ?More related to septic picture.      Plan: Recommend consulting IR for port removal Recommend TEE to evaluate for endocarditis MRI of left knee / femur to eval joint/hardware for infection Change vancomycin to Daptomycin given AKI Send cultures for susceptibilities for Dapto   Hold on central access if possible  Repeat blood cultures for clearance Stop cefepime + flagyl for now  Further GB work up per primary team (?nuclear study)   Principal Problem:   Severe sepsis ([place] Active Problems:   Alcohol dependence ([place]   Tobacco abuse   Gastro-esophageal reflux disease without esophagitis   NSTEMI (non-ST elevated myocardial infarction) (HCC)   Hypotension   Acute kidney injury superimposed on chronic kidney disease ([place]   Uncontrolled type 2 diabetes mellitus with hyperglycemia, with long-term current use of insulin (HCC)   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HCC)   Transaminitis   Hyperbilirubinemia   Macrocytic anemia   Thrombocytopenia (HCC)    vitamin C  500 mg Oral Daily   atorvastatin  40 mg Oral QHS   docusate sodium  100 mg Oral BID   DULoxetine  20 mg Oral Daily   ferrous sulfate  325 mg Oral Q breakfast   insulin aspart  0-6 Units Subcutaneous TID WC   insulin aspart  3 Units Subcutaneous TID WC   melatonin  3 mg Oral QHS   pantoprazole  40 mg Oral Daily   polyethylene glycol  17 g Oral  Daily   sodium chloride flush  3 mL Intravenous Q12H   tamsulosin  0.4 mg Oral Daily    HPI: Corey Gonzalez is a 81 y.o. male admitted from [facility] for labored breathing and back pain. He has had fever at the facility recently and cough for 2 weeks.  Transferred to [facility] for possible STEMI with abnormal EKG changes   Mr. Culp states he has had trouble with his port site since insertion April 18th 2023. Has never healed and has been bleeding recently. He had port accessed for chemo infusion last Thursday and then had fevers/chills Friday. He has noticed some sharp epigastric pains when he coughs (noted in chart to oncology  provider). Further evaluation of chest pain seemed to be more pleuritic in nature and Code STEMI cancelled after further d/w Dr. Burt Knack with Cardiology.  Started on broad spectrum abx with Vanc, cefepime and flagyl. In the interim his blood cultures have returned positive for MRSA on BCID. 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  Saw ortho at [facility] in April 2023 - remarked that this appeared to be healing of the previous fracture and no femoral loosening. IM rod without breakdown. Knee ROM was 5-105 with some mid flexion instability. He says that he has noticed some swelling and stiffness to the left knee. Has always had some problems with flexion since the femoral break but can get around OK with walking aids.    Review of Systems: Review of Systems  Constitutional:  Positive for chills and fever.  Respiratory:  Positive for cough. Negative for sputum production.   Gastrointestinal:  Positive for nausea and vomiting.  Genitourinary: Negative.   Musculoskeletal:  Positive for joint pain (left knee). Negative for back pain.  Skin:        Bleeding of chest port a cath    Past Medical History:  Diagnosis Date   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[place])    Diabetes mellitus without complication ([place])    Hypertension    Lung cancer ([place])     Social History   Tobacco Use   Smoking status: Some Days   Smokeless tobacco: Never   Tobacco comments:    ~5 per week  Substance Use Topics   Alcohol use: Not Currently    Alcohol/week: 35.0 standard drinks    Types: 35 Standard drinks or equivalent per week    Family History  Problem Relation Age of Onset   Diabetes Other    No Known Allergies  OBJECTIVE: Blood pressure 93/62, pulse 91, temperature 97.9 F (36.6 C), temperature source Oral, resp. rate (!) 21, height '5\' 7"'  (1.702 m), weight 71.4 kg, SpO2 98 %.  Physical Exam Constitutional:      Appearance:  Normal appearance. He is not ill-appearing.  HENT:     Mouth/Throat:     Mouth: Mucous membranes are moist.     Pharynx: Oropharynx is clear.  Eyes:     Pupils: Pupils are equal, round, and reactive to light.  Cardiovascular:     Rate and Rhythm: Normal rate and regular rhythm.  Pulmonary:     Effort: Pulmonary effort is normal.     Breath sounds: Normal breath sounds.  Abdominal:     General: Bowel sounds are normal. There is no distension.     Tenderness: There is no abdominal tenderness.  Musculoskeletal:        General: Swelling (left  knee with TTP, swelling and warmth) present.     Cervical back: Normal range of motion.  Skin:    General: Skin is warm and dry.     Capillary Refill: Capillary refill takes less than 2 seconds.  Neurological:     Mental Status: He is alert and oriented to person, place, and time.    Lab Results Lab Results  Component Value Date   WBC 10.2 07/08/2021   HGB 8.8 (L) 07/08/2021   HCT 26.3 (L) 07/08/2021   MCV 99.6 07/08/2021   PLT 51 (L) 07/08/2021    Lab Results  Component Value Date   CREATININE 3.20 (H) 07/08/2021   BUN 45 (H) 07/08/2021   NA 130 (L) 07/08/2021   K 4.6 07/08/2021   CL 102 07/08/2021   CO2 18 (L) 07/08/2021    Lab Results  Component Value Date   ALT 27 07/08/2021   AST 85 (H) 07/08/2021   ALKPHOS 76 07/08/2021   BILITOT 1.7 (H) 07/08/2021     Microbiology: Recent Results (from the past 240 hour(s))  Blood Culture (routine x 2)     Status: Abnormal (Preliminary result)   Collection Time: 07/07/21  4:00 AM   Specimen: BLOOD RIGHT HAND  Result Value Ref Range Status   Specimen Description BLOOD RIGHT HAND  Final   Special Requests   Final    BOTTLES DRAWN AEROBIC AND ANAEROBIC Blood Culture results may not be optimal due to an inadequate volume of blood received in culture bottles   Culture  Setup Time   Final    GRAM POSITIVE COCCI IN CLUSTERS IN BOTH AEROBIC AND ANAEROBIC BOTTLES CRITICAL RESULT CALLED  TO, READ BACK BY AND VERIFIED WITH: PHARMD EDEN BREWINGTON ON 07/07/21 @ 1640 BY DRT    Culture (A)  Final    STAPHYLOCOCCUS AUREUS SUSCEPTIBILITIES TO FOLLOW Performed at [facility], [place] [address], [place], [place] [address]    Report Status PENDING  Incomplete  Blood Culture ID Panel (Reflexed)     Status: Abnormal   Collection Time: 07/07/21  4:00 AM  Result Value Ref Range Status   Enterococcus faecalis NOT DETECTED NOT DETECTED Final   Enterococcus Faecium NOT DETECTED NOT DETECTED Final   Listeria monocytogenes NOT DETECTED NOT DETECTED Final   Staphylococcus species DETECTED (A) NOT DETECTED Final    Comment: CRITICAL RESULT CALLED TO, READ BACK BY AND VERIFIED WITH: PHARMD EDEN BREWINGTON ON 07/07/21 @ 1640 BY DRT    Staphylococcus aureus (BCID) DETECTED (A) NOT DETECTED Final    Comment: Methicillin (oxacillin)-resistant Staphylococcus aureus (MRSA). MRSA is predictably resistant to beta-lactam antibiotics (except ceftaroline). Preferred therapy is vancomycin unless clinically contraindicated. Patient requires contact precautions if  hospitalized. CRITICAL RESULT CALLED TO, READ BACK BY AND VERIFIED WITH: PHARMD EDEN BREWINGTON ON 07/07/21 @ 1640 BY DRT    Staphylococcus epidermidis NOT DETECTED NOT DETECTED Final   Staphylococcus lugdunensis NOT DETECTED NOT DETECTED Final   Streptococcus species NOT DETECTED NOT DETECTED Final   Streptococcus agalactiae NOT DETECTED NOT DETECTED Final   Streptococcus pneumoniae NOT DETECTED NOT DETECTED Final   Streptococcus pyogenes NOT DETECTED NOT DETECTED Final   A.calcoaceticus-baumannii NOT DETECTED NOT DETECTED Final   Bacteroides fragilis NOT DETECTED NOT DETECTED Final   Enterobacterales NOT DETECTED NOT DETECTED Final   Enterobacter cloacae complex NOT DETECTED NOT DETECTED Final   Escherichia coli NOT DETECTED NOT DETECTED Final   Klebsiella aerogenes NOT DETECTED NOT DETECTED Final   Klebsiella oxytoca NOT DETECTED  NOT  DETECTED Final   Klebsiella pneumoniae NOT DETECTED NOT DETECTED Final   Proteus species NOT DETECTED NOT DETECTED Final   Salmonella species NOT DETECTED NOT DETECTED Final   Serratia marcescens NOT DETECTED NOT DETECTED Final   Haemophilus influenzae NOT DETECTED NOT DETECTED Final   Neisseria meningitidis NOT DETECTED NOT DETECTED Final   Pseudomonas aeruginosa NOT DETECTED NOT DETECTED Final   Stenotrophomonas maltophilia NOT DETECTED NOT DETECTED Final   Candida albicans NOT DETECTED NOT DETECTED Final   Candida auris NOT DETECTED NOT DETECTED Final   Candida glabrata NOT DETECTED NOT DETECTED Final   Candida krusei NOT DETECTED NOT DETECTED Final   Candida parapsilosis NOT DETECTED NOT DETECTED Final   Candida tropicalis NOT DETECTED NOT DETECTED Final   Cryptococcus neoformans/gattii NOT DETECTED NOT DETECTED Final   Meth resistant mecA/C and MREJ DETECTED (A) NOT DETECTED Final    Comment: CRITICAL RESULT CALLED TO, READ BACK BY AND VERIFIED WITH: PHARMD EDEN BREWINGTON ON 07/07/21 @ 1640 BY DRT Performed at [facility], [address] [place], [place] [address]   Resp Panel by RT-PCR (Flu A&B, Covid) Nasopharyngeal Swab     Status: None   Collection Time: 07/07/21  4:06 AM   Specimen: Nasopharyngeal Swab; Nasopharyngeal(NP) swabs in vial transport medium  Result Value Ref Range Status   SARS Coronavirus 2 by RT PCR NEGATIVE NEGATIVE Final    Comment: (NOTE) SARS-CoV-2 target nucleic acids are NOT DETECTED.  The SARS-CoV-2 RNA is generally detectable in upper respiratory specimens during the acute phase of infection. The lowest concentration of SARS-CoV-2 viral copies this assay can detect is 138 copies/mL. A negative result does not preclude SARS-Cov-2 infection and should not be used as the sole basis for treatment or other patient management decisions. A negative result may occur with  improper specimen collection/handling, submission of specimen other than  nasopharyngeal swab, presence of viral mutation(s) within the areas targeted by this assay, and inadequate number of viral copies(<138 copies/mL). A negative result must be combined with clinical observations, patient history, and epidemiological information. The expected result is Negative.  Fact Sheet for Patients:  EntrepreneurPulse.com.au  Fact Sheet for Healthcare Providers:  IncredibleEmployment.be  This test is no t yet approved or cleared by the [place] FDA and  has been authorized for detection and/or diagnosis of SARS-CoV-2 by FDA under an Emergency Use Authorization (EUA). This EUA will remain  in effect (meaning this test can be used) for the duration of the COVID-19 declaration under Section 564(b)(1) of the Act, 21 U.S.C.section 360bbb-3(b)(1), unless the authorization is terminated  or revoked sooner.       Influenza A by PCR NEGATIVE NEGATIVE Final   Influenza B by PCR NEGATIVE NEGATIVE Final    Comment: (NOTE) The Xpert Xpress SARS-CoV-2/FLU/RSV plus assay is intended as an aid in the diagnosis of influenza from Nasopharyngeal swab specimens and should not be used as a sole basis for treatment. Nasal washings and aspirates are unacceptable for Xpert Xpress SARS-CoV-2/FLU/RSV testing.  Fact Sheet for Patients: EntrepreneurPulse.com.au  Fact Sheet for Healthcare Providers: IncredibleEmployment.be  This test is not yet approved or cleared by the [place] FDA and has been authorized for detection and/or diagnosis of SARS-CoV-2 by FDA under an Emergency Use Authorization (EUA). This EUA will remain in effect (meaning this test can be used) for the duration of the COVID-19 declaration under Section 564(b)(1) of the Act, 21 U.S.C. section 360bbb-3(b)(1), unless the authorization is terminated or revoked.  Performed at [facility], [place] [address], [place], [place] [address]    Blood Culture (routine x 2)     Status: Abnormal (Preliminary result)   Collection Time: 07/07/21  4:15 AM   Specimen: BLOOD  Result Value Ref Range Status   Specimen Description BLOOD LEFT ANTECUBITAL  Final   Special Requests   Final    BOTTLES DRAWN AEROBIC AND ANAEROBIC Blood Culture adequate volume   Culture  Setup Time   Final    GRAM POSITIVE COCCI IN CLUSTERS IN BOTH AEROBIC AND ANAEROBIC BOTTLES CRITICAL VALUE NOTED.  VALUE IS CONSISTENT WITH PREVIOUSLY REPORTED AND CALLED VALUE. CRITICAL RESULT CALLED TO, READ BACK BY AND VERIFIED WITH: Lorin Mercy [id number] [id number]'  FH   Performed at [facility], [place] [address], [place], [place] [address]    Culture STAPHYLOCOCCUS AUREUS (A)  Final   Report Status PENDING  Incomplete  Urine Culture     Status: None   Collection Time: 07/07/21  7:42 AM   Specimen: In/Out Cath Urine  Result Value Ref Range Status   Specimen Description IN/OUT CATH URINE  Final   Special Requests NONE  Final   Culture   Final    NO GROWTH Performed at [facility], [place] [address] [place], [place] [address]    Report Status 07/08/2021 FINAL  Final    Janene Madeira, MSN, NP-C [facility] [email or web address] Pager: [phone] Office: [phone] [facility] Main Line: [place] Communication Welcome

## 2021-07-09 ENCOUNTER — Inpatient Hospital Stay (HOSPITAL_COMMUNITY): Payer: No Typology Code available for payment source

## 2021-07-09 DIAGNOSIS — R778 Other specified abnormalities of plasma proteins: Secondary | ICD-10-CM | POA: Diagnosis not present

## 2021-07-09 DIAGNOSIS — R6520 Severe sepsis without septic shock: Secondary | ICD-10-CM | POA: Diagnosis not present

## 2021-07-09 DIAGNOSIS — I248 Other forms of acute ischemic heart disease: Secondary | ICD-10-CM | POA: Diagnosis not present

## 2021-07-09 DIAGNOSIS — A419 Sepsis, unspecified organism: Secondary | ICD-10-CM | POA: Diagnosis not present

## 2021-07-09 DIAGNOSIS — N179 Acute kidney failure, unspecified: Secondary | ICD-10-CM | POA: Diagnosis not present

## 2021-07-09 HISTORY — PX: IR REMOVAL TUN ACCESS W/ PORT W/O FL MOD SED: IMG2290

## 2021-07-09 LAB — CBC
HCT: 27 % — ABNORMAL LOW (ref 39.0–52.0)
Hemoglobin: 9.2 g/dL — ABNORMAL LOW (ref 13.0–17.0)
MCH: 33.9 pg (ref 26.0–34.0)
MCHC: 34.1 g/dL (ref 30.0–36.0)
MCV: 99.6 fL (ref 80.0–100.0)
Platelets: 85 10*3/uL — ABNORMAL LOW (ref 150–400)
RBC: 2.71 MIL/uL — ABNORMAL LOW (ref 4.22–5.81)
RDW: 21.2 % — ABNORMAL HIGH (ref 11.5–15.5)
WBC: 9.1 10*3/uL (ref 4.0–10.5)
nRBC: 0 % (ref 0.0–0.2)

## 2021-07-09 LAB — COMPREHENSIVE METABOLIC PANEL
ALT: 26 U/L (ref 0–44)
AST: 62 U/L — ABNORMAL HIGH (ref 15–41)
Albumin: 2.4 g/dL — ABNORMAL LOW (ref 3.5–5.0)
Alkaline Phosphatase: 79 U/L (ref 38–126)
Anion gap: 10 (ref 5–15)
BUN: 59 mg/dL — ABNORMAL HIGH (ref 8–23)
CO2: 20 mmol/L — ABNORMAL LOW (ref 22–32)
Calcium: 8.2 mg/dL — ABNORMAL LOW (ref 8.9–10.3)
Chloride: 98 mmol/L (ref 98–111)
Creatinine, Ser: 4.56 mg/dL — ABNORMAL HIGH (ref 0.61–1.24)
GFR, Estimated: 12 mL/min — ABNORMAL LOW (ref >60)
Glucose, Bld: 125 mg/dL — ABNORMAL HIGH (ref 70–99)
Potassium: 4.1 mmol/L (ref 3.5–5.1)
Sodium: 128 mmol/L — ABNORMAL LOW (ref 135–145)
Total Bilirubin: 1.3 mg/dL — ABNORMAL HIGH (ref 0.3–1.2)
Total Protein: 5.9 g/dL — ABNORMAL LOW (ref 6.5–8.1)

## 2021-07-09 LAB — PHOSPHORUS: Phosphorus: 4 mg/dL (ref 2.5–4.6)

## 2021-07-09 LAB — APTT: aPTT: 104 seconds — ABNORMAL HIGH (ref 24–36)

## 2021-07-09 LAB — CK: Total CK: 187 U/L (ref 49–397)

## 2021-07-09 LAB — GLUCOSE, CAPILLARY
Glucose-Capillary: 140 mg/dL — ABNORMAL HIGH (ref 70–99)
Glucose-Capillary: 141 mg/dL — ABNORMAL HIGH (ref 70–99)
Glucose-Capillary: 143 mg/dL — ABNORMAL HIGH (ref 70–99)
Glucose-Capillary: 134 mg/dL — ABNORMAL HIGH (ref 70–99)

## 2021-07-09 LAB — UREA NITROGEN, URINE: Urea Nitrogen, Ur: 558 mg/dL

## 2021-07-09 LAB — HEPARIN LEVEL (UNFRACTIONATED): Heparin Unfractionated: >1.1 IU/mL — ABNORMAL HIGH (ref 0.30–0.70)

## 2021-07-09 LAB — MAGNESIUM: Magnesium: 1.7 mg/dL (ref 1.7–2.4)

## 2021-07-09 IMAGING — MR MR KNEE*L* W/O CM
7 series · 40 of 40 positions shown · non-contrast
Comparison: None Available.

CLINICAL DATA: History of knee replacement. Evaluate for infection.

EXAM:
MRI OF THE LEFT KNEE WITHOUT CONTRAST
TECHNIQUE: Multiplanar, multisequence MR imaging of the knee was performed. No
intravenous contrast was administered.

[Series 5: T2 fat-sat · axial · left · 4.0mm · 0.44mm/px · z∈[-86,+59]mm · 4 of 30 slices shown]
[im 1/30]
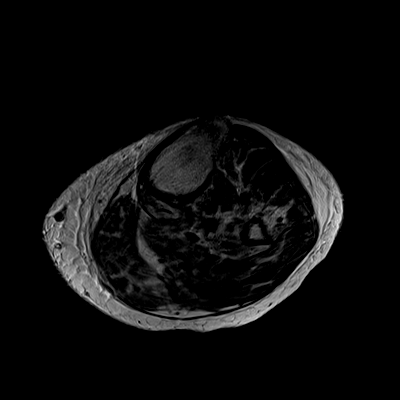
[im 10/30]
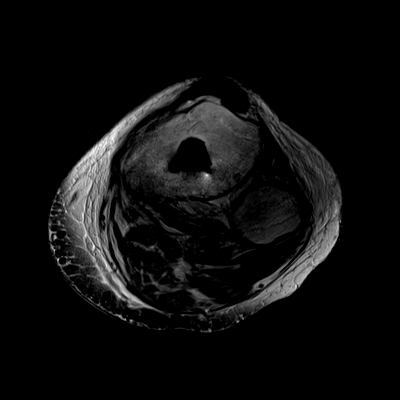
[im 20/30]
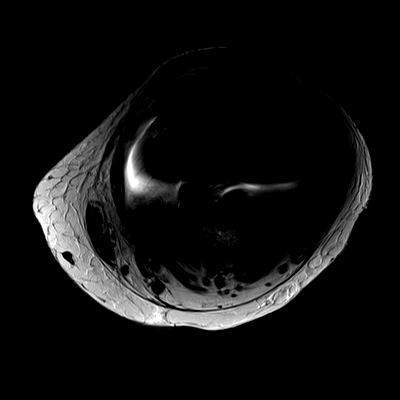
[im 30/30]
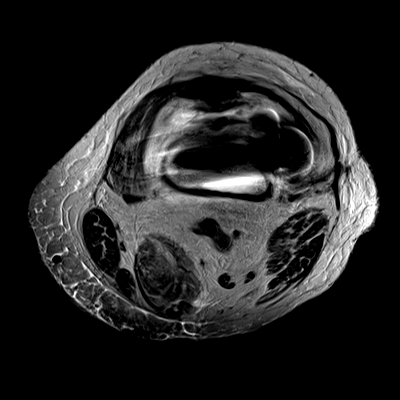

[Series 6: T1 · coronal · left · 4.0mm · 0.62mm/px · 5 of 30 slices shown]
[im 1/30]
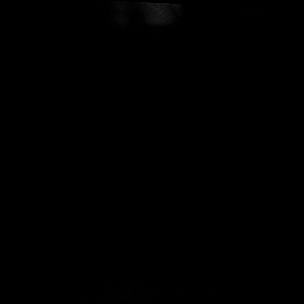
[im 8/30]
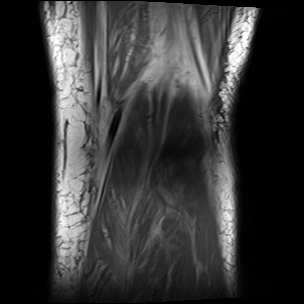
[im 15/30]
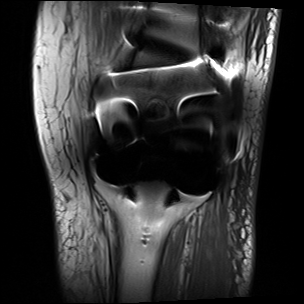
[im 22/30]
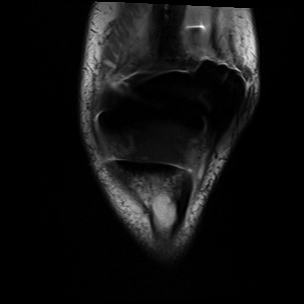
[im 30/30]
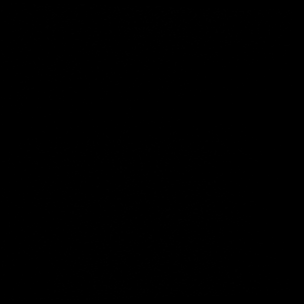

[Series 7: PD fat-sat · coronal · left · 3.0mm · 0.59mm/px · 6 of 38 slices shown]
[im 1/38]
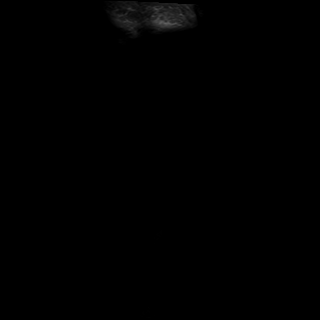
[im 8/38]
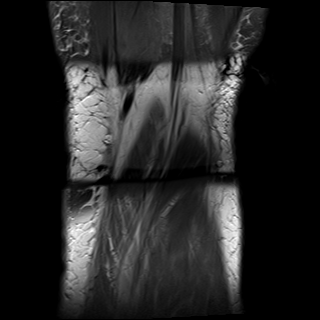
[im 15/38]
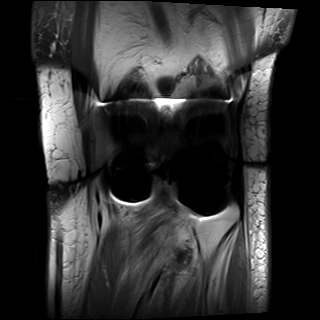
[im 23/38]
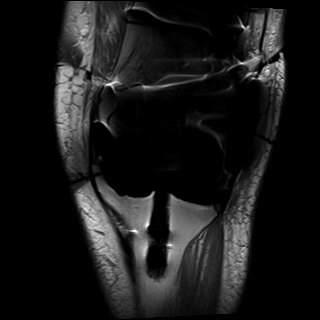
[im 30/38]
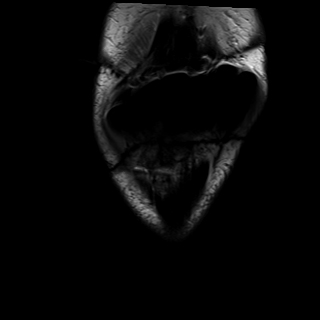
[im 38/38]
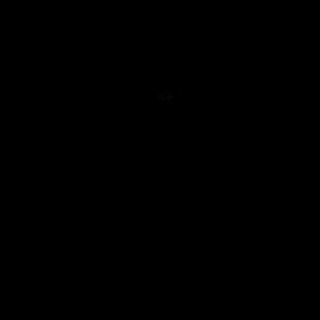

[Series 8: T2 · axial · left · 4.0mm · 0.44mm/px · z∈[-84,+61]mm · 5 of 30 slices shown]
[im 1/30]
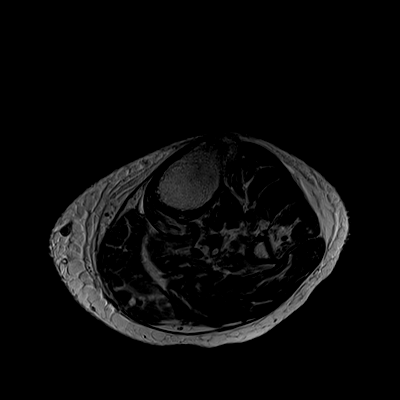
[im 8/30]
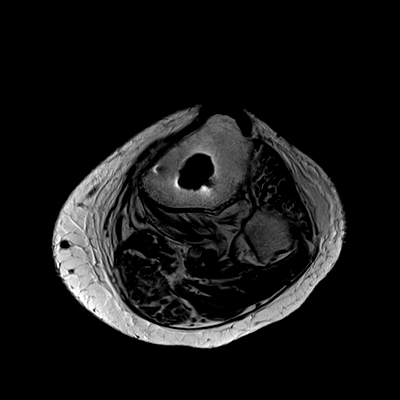
[im 15/30]
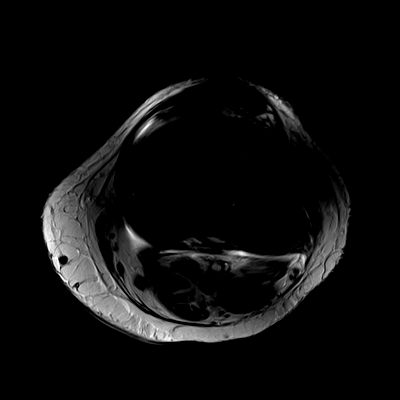
[im 22/30]
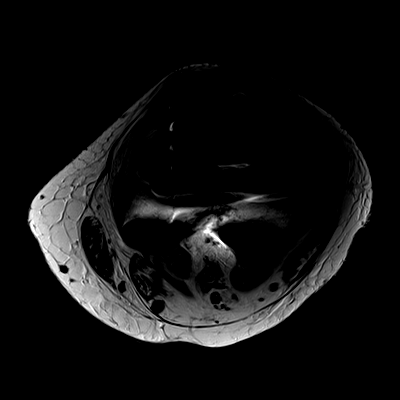
[im 30/30]
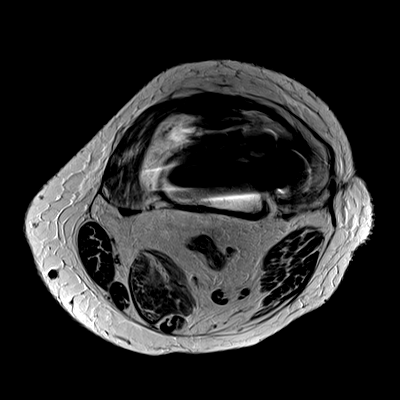

[Series 10: PD · sagittal · left · 3.0mm · 0.59mm/px · 7 of 42 slices shown (1 of 2)]
[im 1/42]
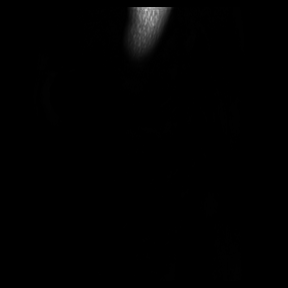
[im 7/42]
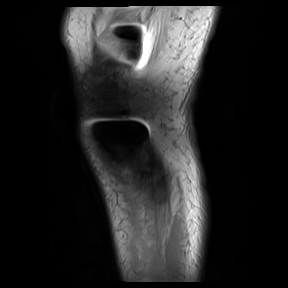
[im 14/42]
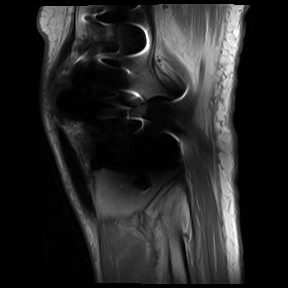
[im 21/42]
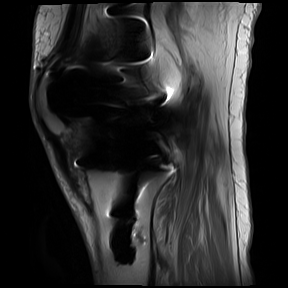
[im 28/42]
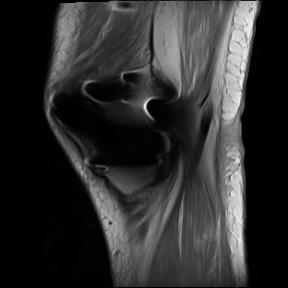
[im 35/42]
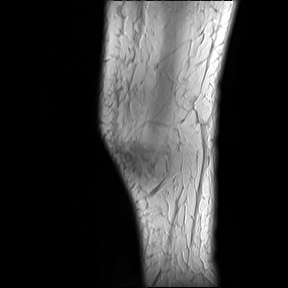
[im 42/42]
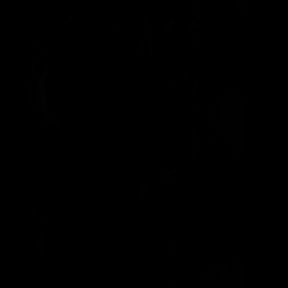

[Series 11: PD · coronal · left · 3.0mm · 0.59mm/px · 6 of 38 slices shown (2 of 2)]
[im 1/38]
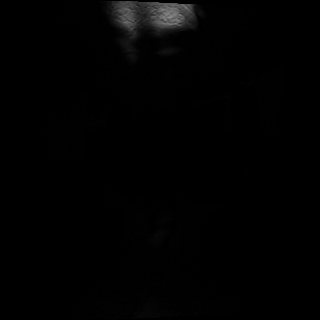
[im 8/38]
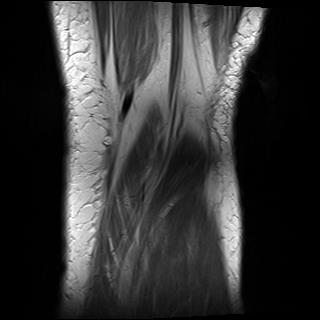
[im 15/38]
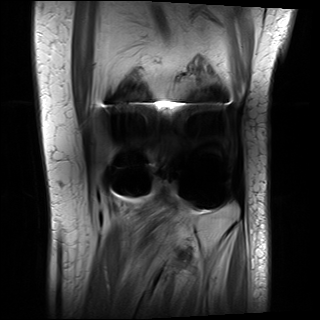
[im 23/38]
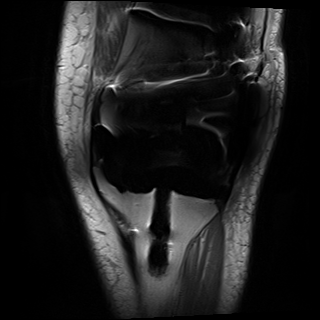
[im 30/38]
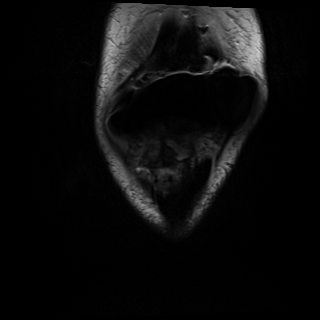
[im 38/38]
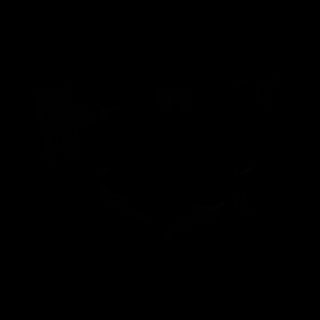

[Series 12: STIR · sagittal · left · 3.0mm · 0.59mm/px · 7 of 42 slices shown]
[im 1/42]
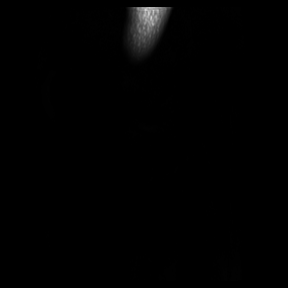
[im 7/42]
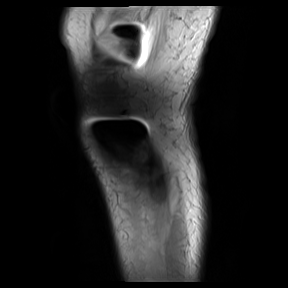
[im 14/42]
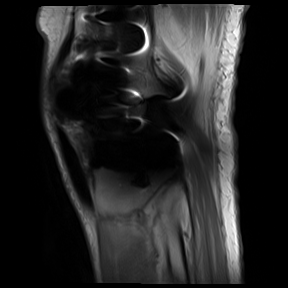
[im 21/42]
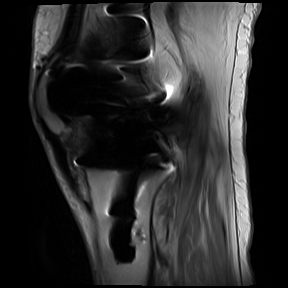
[im 28/42]
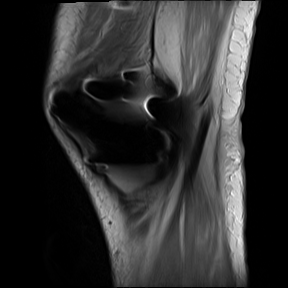
[im 35/42]
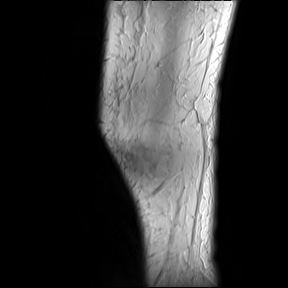
[im 42/42]
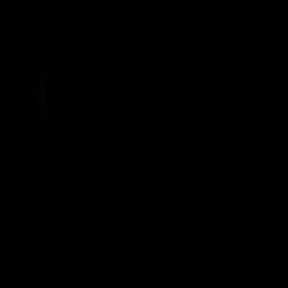

[40 of 40 positions shown; findings below may reference images not displayed]

FINDINGS: Bones/Joint/Cartilage

No acute fracture or dislocation.

Left total knee arthroplasty with susceptibility artifact severely
limiting evaluation of the adjacent soft tissue and osseous
structures. No periarticular fluid collection, hematoma or
osteolysis. No evidence of hardware failure or complication within
limitations of the artifact.

Normal alignment. No joint effusion. No marrow signal abnormality.

Muscles and Tendons

Soft tissue edema involving the distal aspect of the vastus
lateralis muscle concerning for muscle strain versus myositis. No
intramuscular fluid collection or hematoma. Quadriceps tendon and
patellar tendon are intact.

Soft tissue
No fluid collection or hematoma. No soft tissue mass. Generalized
anasarca.
IMPRESSION: 1. Left total knee arthroplasty with susceptibility artifact
severely limiting evaluation of the adjacent soft tissue and osseous
structures. No periarticular fluid collection, hematoma or
osteolysis. No evidence of hardware failure or complication.
2. Soft tissue edema involving the distal aspect of the vastus
lateralis muscle concerning for muscle strain versus myositis.

## 2021-07-09 IMAGING — MR MR FEMUR*L* W/O CM
17 series · 40 of 40 positions shown · non-contrast
Comparison: None Available.

CLINICAL DATA: History of left hip ORIF. Evaluate soft tissue
infection.

EXAM:
MR OF THE LEFT FEMUR WITHOUT CONTRAST
TECHNIQUE: Multiplanar, multisequence MR imaging of the left femur was
performed. No intravenous contrast was administered.

[Series 3: T1 · coronal · left · 5.0mm · 1.15mm/px · 3 of 36 slices shown (1 of 4)]
[im 1/36]
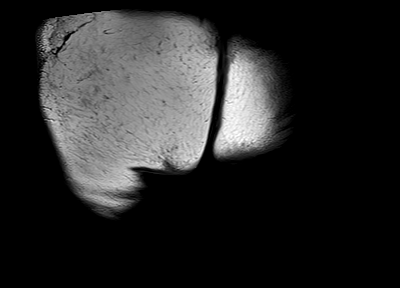
[im 18/36]
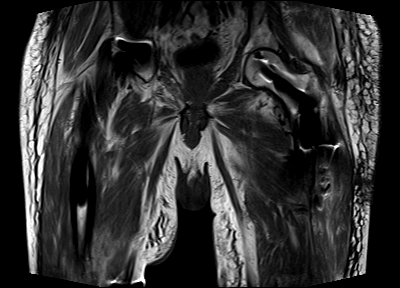
[im 36/36]
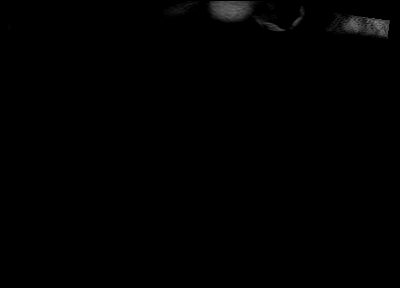

[Series 4: T1 · coronal · left · 5.0mm · 1.15mm/px · 3 of 36 slices shown (2 of 4)]
[im 1/36]
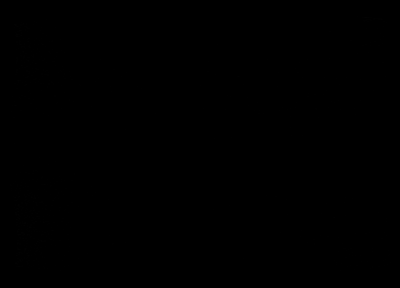
[im 18/36]
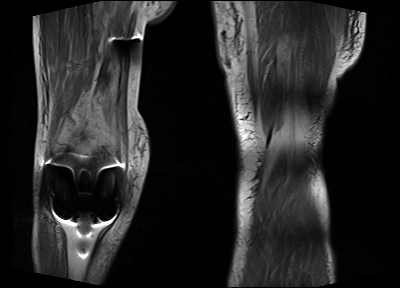
[im 36/36]
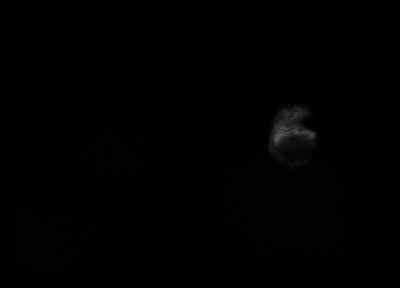

[Series 5: composed cor t1_comp · coronal · left · 6.0mm · 1.15mm/px · 2 of 36 slices shown]
[im 1/36]
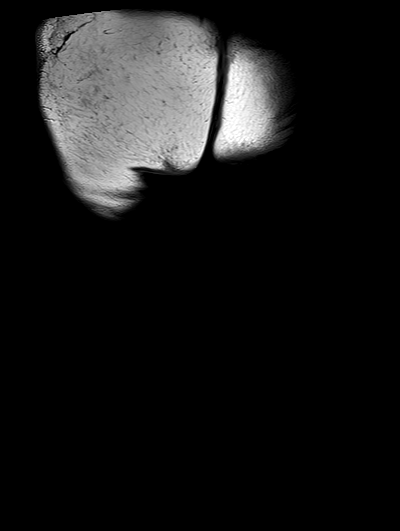
[im 36/36]
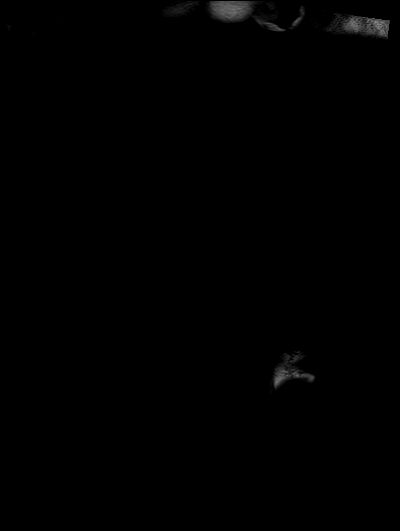

[Series 6: composed cor t1_comp_filt · coronal · left · 6.0mm · 1.15mm/px · 2 of 36 slices shown]
[im 1/36]
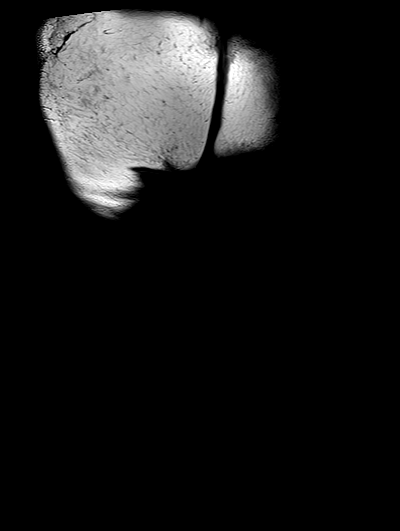
[im 36/36]
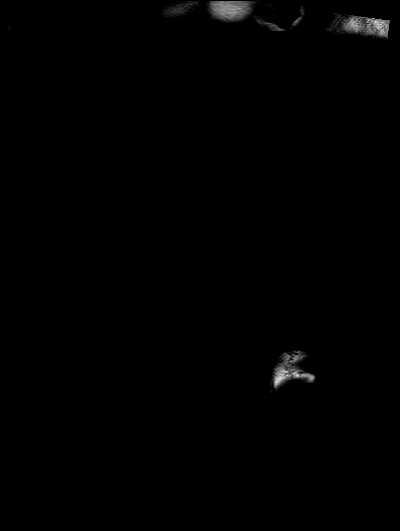

[Series 7: STIR · coronal · left · 5.0mm · 1.25mm/px · 2 of 36 slices shown (1 of 4)]
[im 1/36]
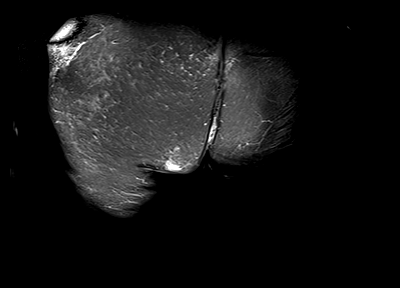
[im 36/36]
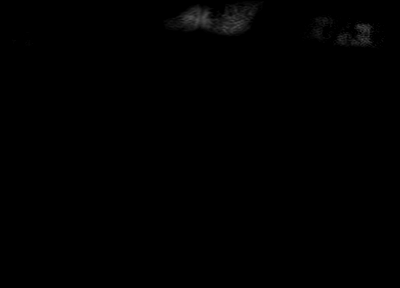

[Series 8: STIR · coronal · left · 5.0mm · 1.25mm/px · 2 of 36 slices shown (2 of 4)]
[im 1/36]
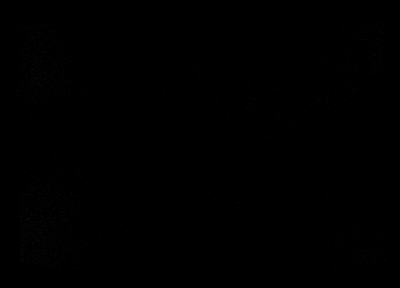
[im 36/36]
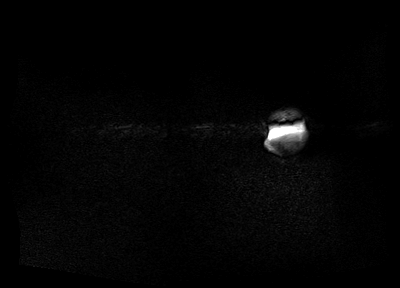

[Series 9: composed cor stir_comp · coronal · left · 6.0mm · 1.25mm/px · 2 of 36 slices shown]
[im 1/36]
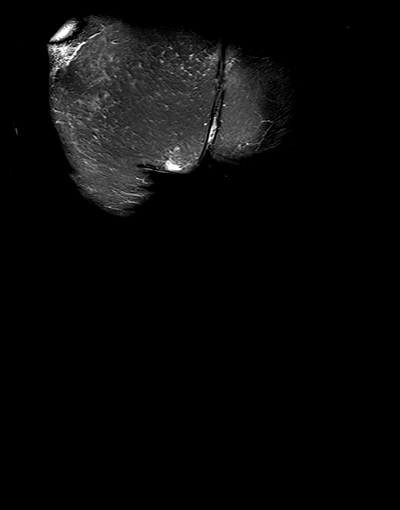
[im 36/36]
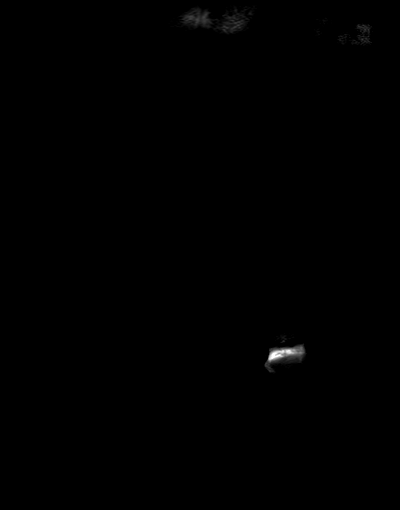

[Series 10: composed cor stir_comp_filt · coronal · left · 6.0mm · 1.25mm/px · 2 of 36 slices shown]
[im 1/36]
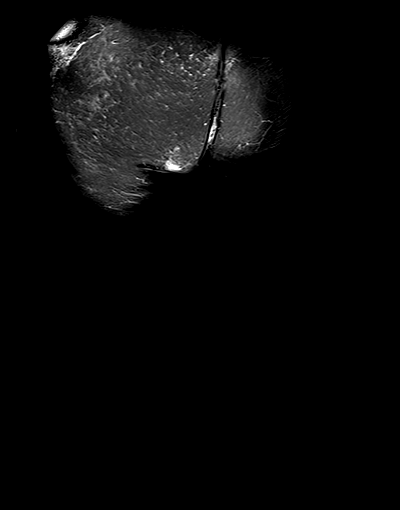
[im 36/36]
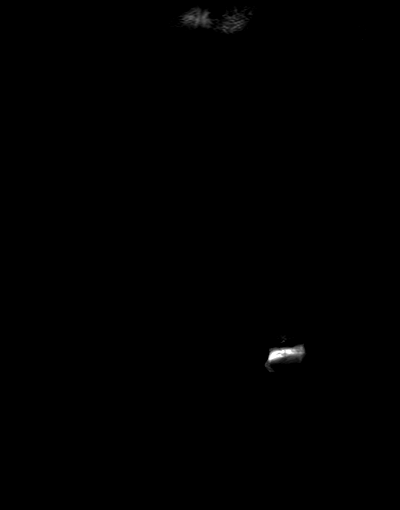

[Series 11: T1 · axial · left · 5.0mm · 0.86mm/px · z∈[+175,+432]mm · 2 of 44 slices shown (3 of 4)]
[im 1/44]
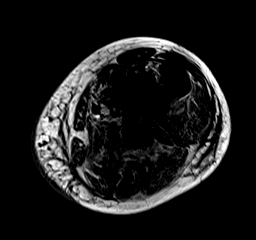
[im 44/44]
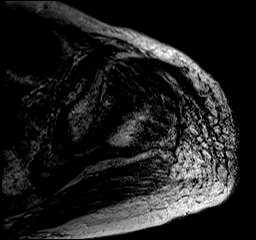

[Series 12: T1 · axial · left · 5.0mm · 0.86mm/px · z∈[-42,+209]mm · 2 of 43 slices shown (4 of 4)]
[im 1/43]
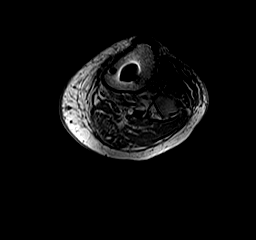
[im 43/43]
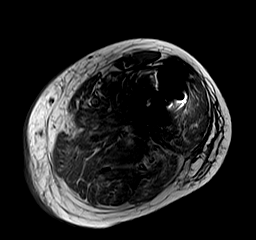

[Series 13: ax t1_comp · axial · left · 5.0mm · 0.86mm/px · z∈[-42,+432]mm · 4 of 79 slices shown]
[im 1/79]
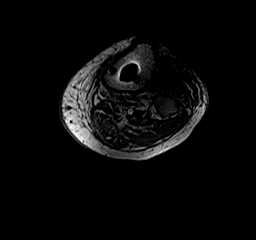
[im 27/79]
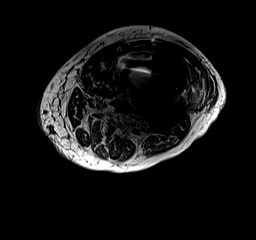
[im 53/79]
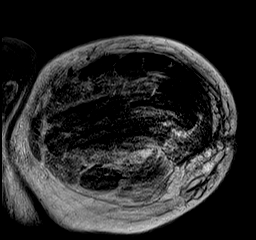
[im 79/79]
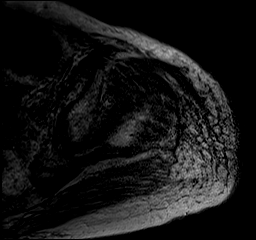

[Series 14: T2 fat-sat · axial · left · 5.0mm · 1.06mm/px · z∈[+175,+432]mm · 2 of 44 slices shown (1 of 2)]
[im 1/44]
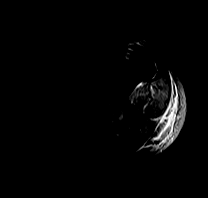
[im 44/44]
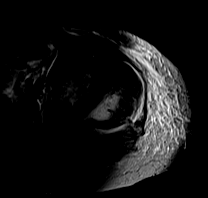

[Series 15: T2 fat-sat · axial · left · 5.0mm · 1.06mm/px · z∈[-45,+212]mm · 2 of 44 slices shown (2 of 2)]
[im 1/44]
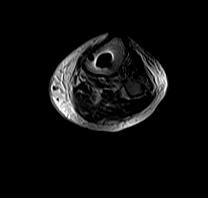
[im 44/44]
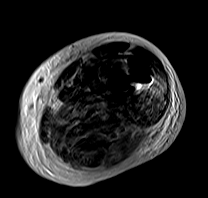

[Series 16: T2 · axial · left · 5.0mm · 1.06mm/px · z∈[-45,+432]mm · 4 of 80 slices shown]
[im 1/80]
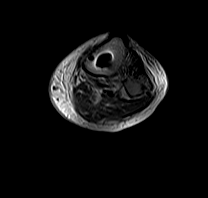
[im 27/80]
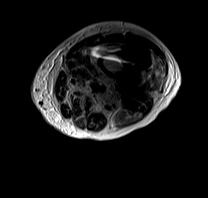
[im 53/80]
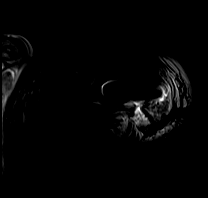
[im 80/80]
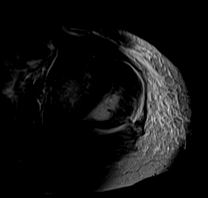

[Series 17: STIR · sagittal · left · 4.0mm · 1.15mm/px · 2 of 40 slices shown (3 of 4)]
[im 1/40]
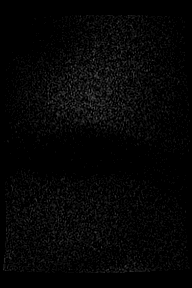
[im 40/40]
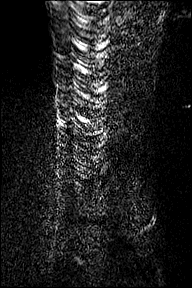

[Series 18: STIR · sagittal · left · 4.0mm · 1.15mm/px · 2 of 42 slices shown (4 of 4)]
[im 1/42]
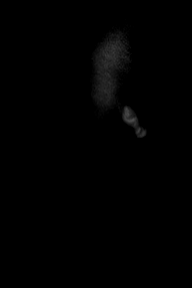
[im 42/42]
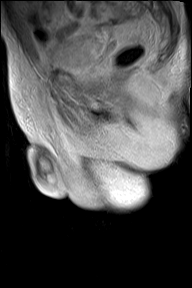

[Series 19: sag stir_comp · sagittal · left · 5.0mm · 1.15mm/px · 2 of 43 slices shown]
[im 1/43]
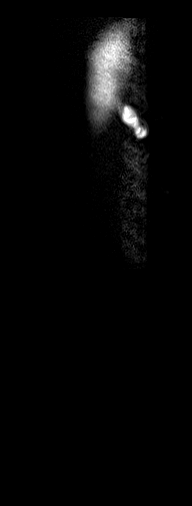
[im 43/43]
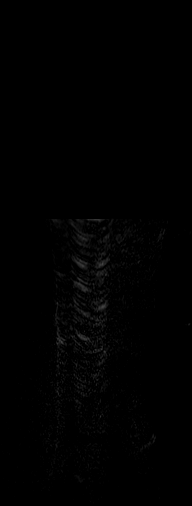

[40 of 40 positions shown; findings below may reference images not displayed]

FINDINGS: Bones/Joint/Cartilage

No acute fracture or dislocation.

Prior ORIF of a healed intertrochanteric fracture with
susceptibility artifact partially obscuring the adjacent soft tissue
and osseous structures. Prior ORIF of a left femoral diaphysis
fracture transfixed with a lateral sideplate and multiple
interlocking screws with susceptibility artifact partially obscuring
the adjacent soft tissue and osseous structures.

No aggressive osseous lesion. Normal alignment. No joint effusion.
No marrow signal abnormality. Partial-thickness cartilage loss of
the left femoral head and acetabulum.

Right total hip arthroplasty partially visualized. No periarticular
fluid collection or osteolysis.

Muscles and Tendons

Mild edema in the tensor fascia lata and vastus intermedius muscle
adjacent to the hip concerning for mild myositis with surrounding
soft tissue edema between the muscle planes. Mild muscle edema in
the distal vastus lateralis muscle concerning for myositis versus
muscle strain. No intramuscular fluid collection or hematoma.

Soft tissue
Generalized anasarca. No soft tissue mass. No fluid collection or
hematoma.
IMPRESSION: 1. No evidence of septic arthritis of the left hip. Mild-moderate
osteoarthritis of the left hip.
2. Mild edema in the tensor fascia lata and vastus intermedius
muscle adjacent to the hip concerning for mild myositis with
surrounding soft tissue edema between the muscle planes. Mild muscle
edema in the distal vastus lateralis muscle concerning for myositis
versus muscle strain. No fluid collection to suggest an abscess.
3. Evaluation for infection related to the hardware is extremely
limited given the degree of artifact resulting from the metallic
hardware.

## 2021-07-09 IMAGING — US US RENAL
1 series · 14 of 25 positions shown · non-contrast
Comparison: CT chest, abdomen, and pelvis [DATE]

CLINICAL DATA: Acute renal failure.

EXAM:
RENAL / URINARY TRACT ULTRASOUND COMPLETE

[Series 1: us renal · 14 of 53 slices shown]
[im 1/53]
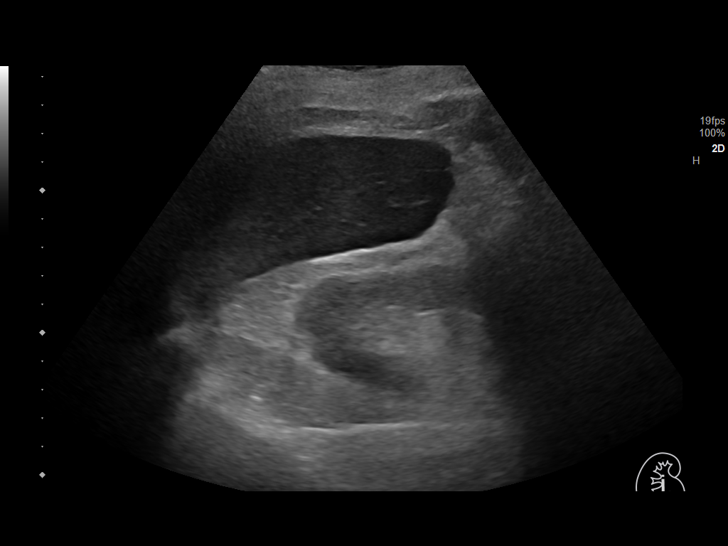
[im 5/53]
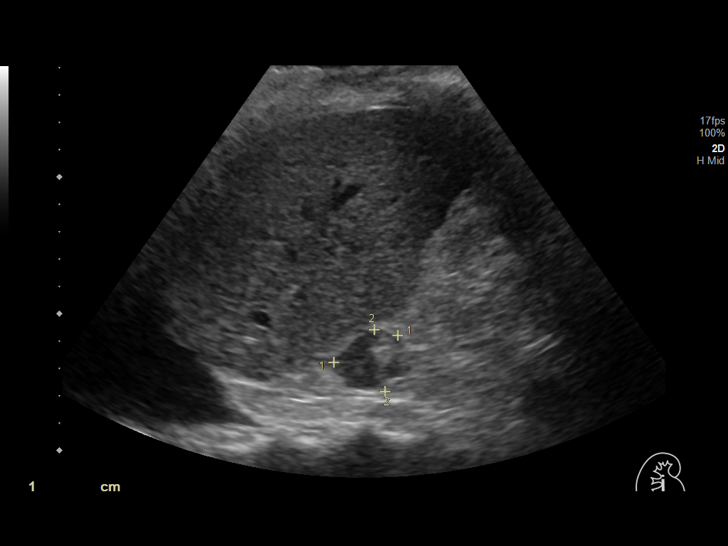
[im 9/53]
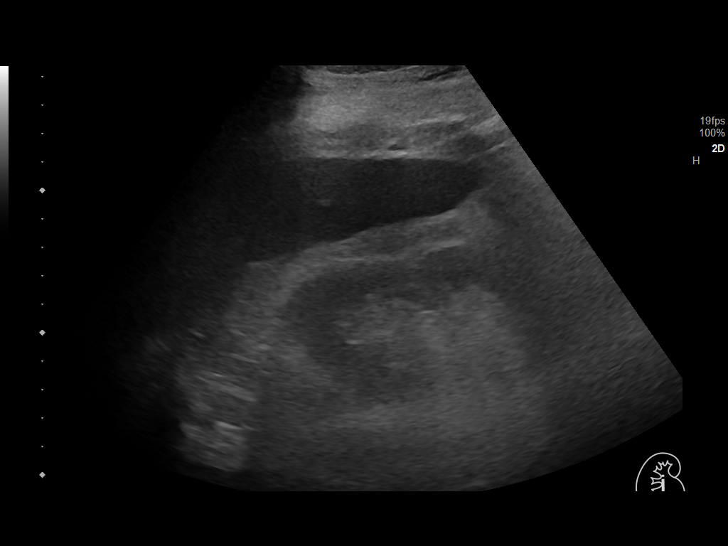
[im 14/53]
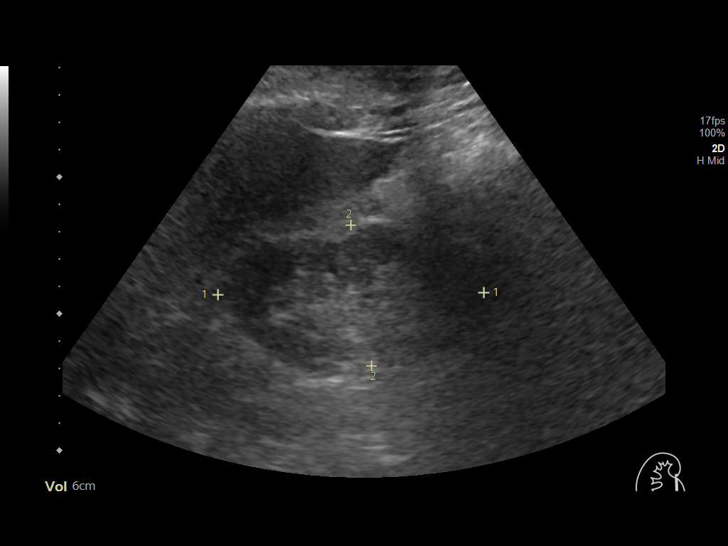
[im 18/53]
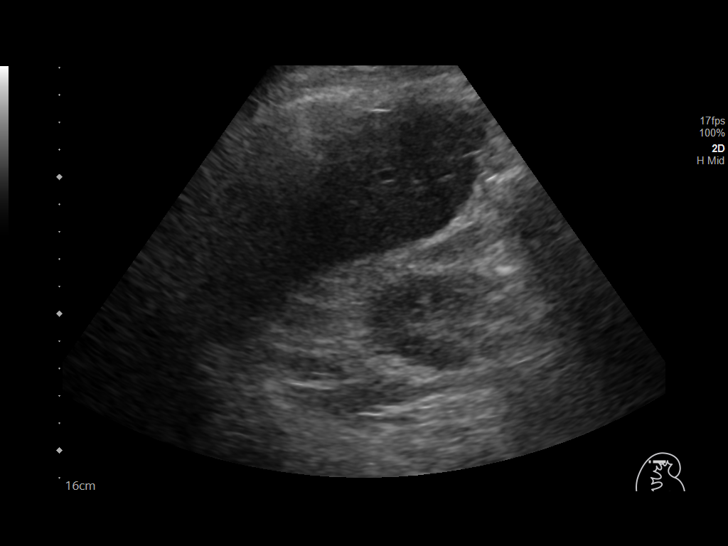
[im 20/53]
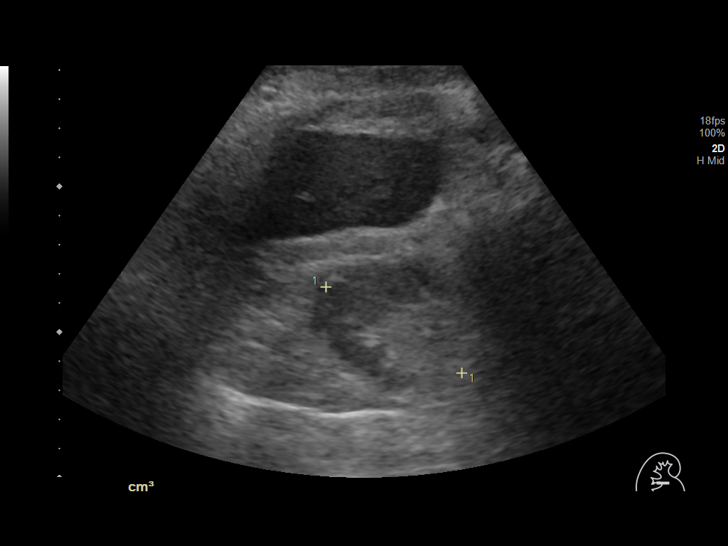
[im 24/53]
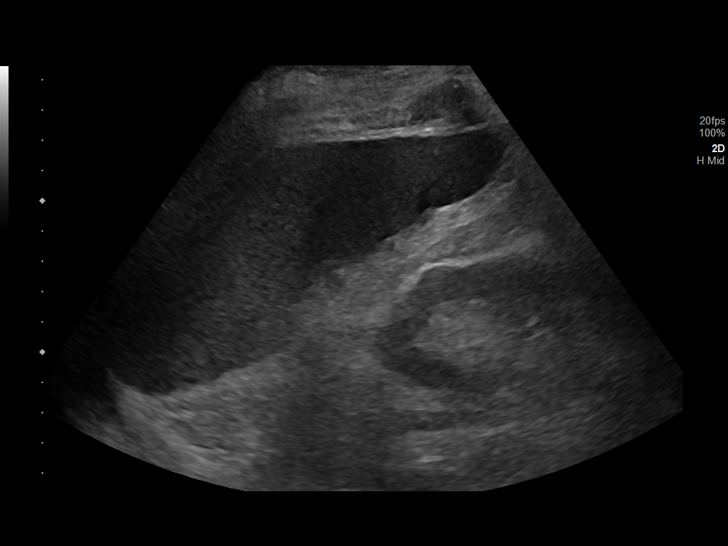
[im 29/53]
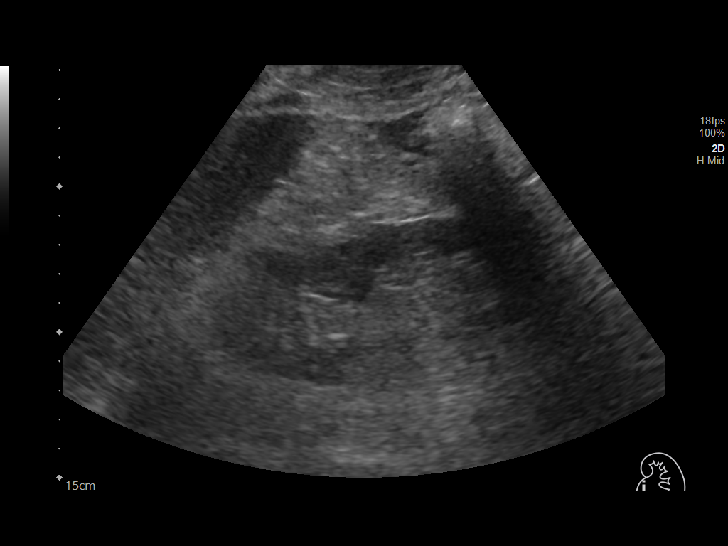
[im 33/53]
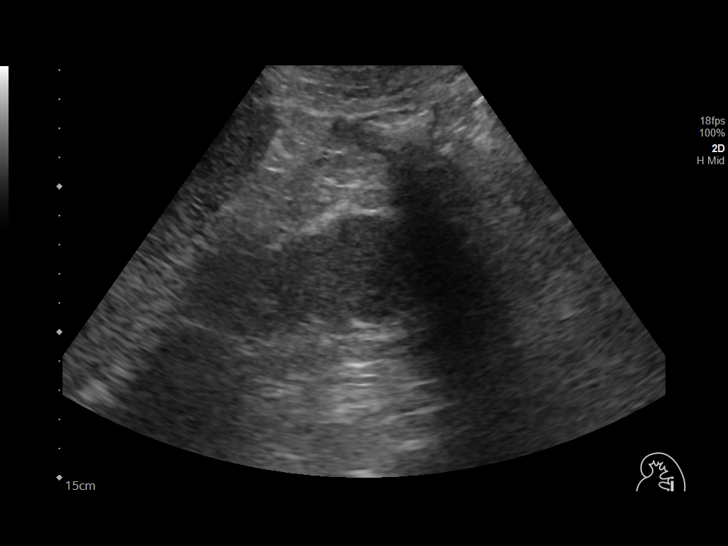
[im 35/53]
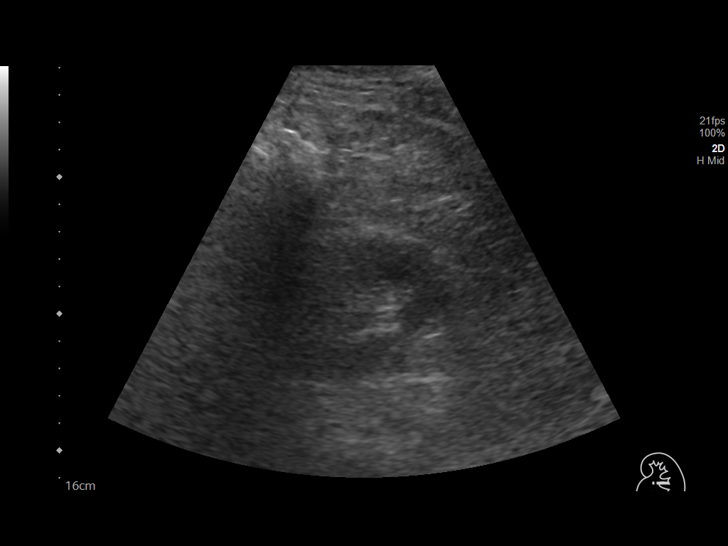
[im 40/53]
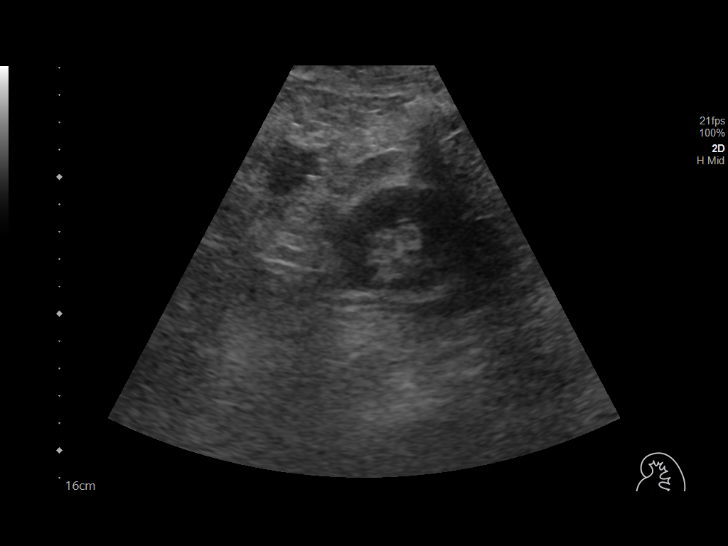
[im 44/53]
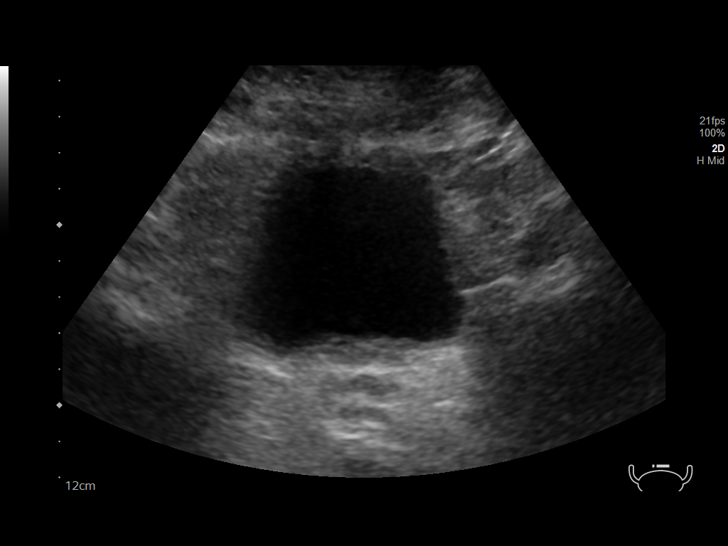
[im 48/53]
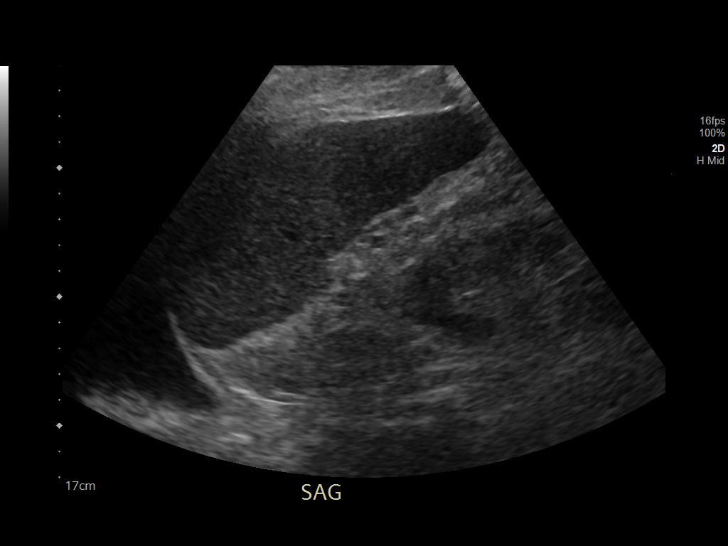
[im 53/53]
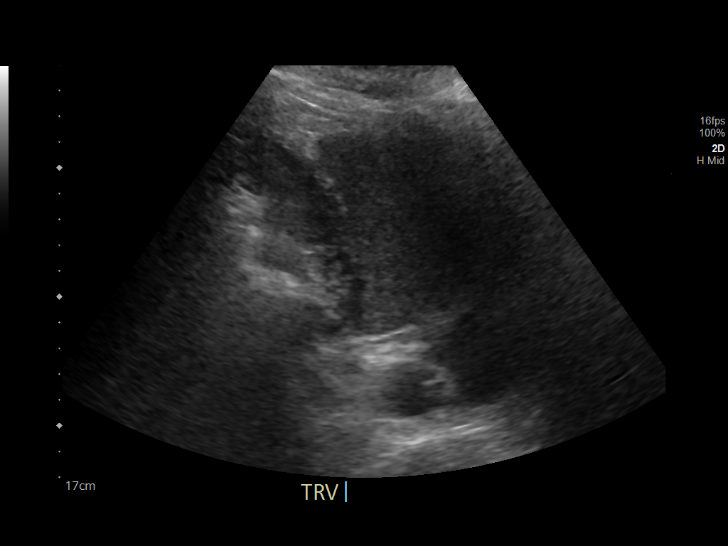

[14 of 25 positions shown; findings below may reference images not displayed]

FINDINGS: Decreased image resolution due to patient body habitus.

Right Kidney:

Renal measurements: 9.7 x 5.2 x 5.5 cm = volume: 146 mL.
Echogenicity within normal limits. No mass or hydronephrosis
visualized.

Left Kidney:

Renal measurements: 9.4 x 4.9 x 5.7 cm = volume: 136 mL.
Echogenicity within normal limits. No mass or hydronephrosis
visualized.

Bladder:

Appears normal for degree of bladder distention.

Other:

Bilateral pleural effusions. Splenomegaly with length of 13.5 cm and
estimated volume of 456 cc. 2.6 cm mass between the liver and right
kidney corresponding to the adrenal lesion on CT.
IMPRESSION: 1. No hydronephrosis.
2. Bilateral pleural effusions.
3. Splenomegaly.
4. 2.6 cm right adrenal mass as described on recent CT.

## 2021-07-09 MED ORDER — SODIUM CHLORIDE 0.9 % IV SOLN: INTRAVENOUS | Status: DC

## 2021-07-09 MED ORDER — LIDOCAINE HCL 1 % IJ SOLN
INTRAMUSCULAR | Status: AC
  Filled 2021-07-09: qty 20

## 2021-07-09 MED ORDER — HYDROMORPHONE HCL 1 MG/ML IJ SOLN
INTRAMUSCULAR | Status: AC | PRN
  Administered 2021-07-09 (×2): .5 mg via INTRAVENOUS

## 2021-07-09 MED ORDER — HYDROMORPHONE HCL 1 MG/ML IJ SOLN
INTRAMUSCULAR | Status: AC
  Filled 2021-07-09: qty 1

## 2021-07-09 MED ORDER — HEPARIN (PORCINE) 25000 UT/250ML-% IV SOLN
450.0000 [IU]/h | INTRAVENOUS | Status: DC
  Administered 2021-07-09: 450 [IU]/h via INTRAVENOUS

## 2021-07-09 MED ORDER — ALPRAZOLAM 0.25 MG PO TABS
0.2500 mg | ORAL_TABLET | Freq: Once | ORAL | Status: AC
  Administered 2021-07-09: 0.25 mg via ORAL
  Filled 2021-07-09: qty 1

## 2021-07-09 MED ORDER — HEPARIN SODIUM (PORCINE) 5000 UNIT/ML IJ SOLN: 5000.0000 [IU] | Freq: Three times a day (TID) | INTRAMUSCULAR | Status: DC

## 2021-07-09 NOTE — Progress Notes (Addendum)
Progress Note  Patient Name: Corey Gonzalez Date of Encounter: 07/09/2021  [facility] Cardiologist: None   Subjective   Laying in bed, sleepy. Just returned from port-a-cath removal  Inpatient Medications    Scheduled Meds:  vitamin C  500 mg Oral Daily   atorvastatin  40 mg Oral QHS   Chlorhexidine Gluconate Cloth  6 each Topical Q0600   docusate sodium  100 mg Oral BID   DULoxetine  20 mg Oral Daily   ferrous sulfate  325 mg Oral Q breakfast   insulin aspart  0-6 Units Subcutaneous TID WC   insulin aspart  3 Units Subcutaneous TID WC   lidocaine       melatonin  3 mg Oral QHS   mupirocin ointment  1 application. Nasal BID   pantoprazole  40 mg Oral Daily   polyethylene glycol  17 g Oral Daily   sodium chloride flush  3 mL Intravenous Q12H   tamsulosin  0.4 mg Oral Daily   Continuous Infusions:  sodium chloride 10 mL/hr at 07/08/21 1804   DAPTOmycin (CUBICIN)  IV 550 mg (07/08/21 2045)   PRN Meds: sodium chloride, acetaminophen **OR** acetaminophen, albuterol, bisacodyl, HYDROcodone-acetaminophen, magic mouthwash w/lidocaine, ondansetron **OR** ondansetron (ZOFRAN) IV, senna-docusate   Vital Signs    Vitals:   07/09/21 0840 07/09/21 0845 07/09/21 0850 07/09/21 0855  BP: 90/60 104/60 (!) 86/61 (!) 90/58  Pulse: 80 81 86 86  Resp: (!) 30 18 18 15   Temp:      TempSrc:      SpO2: 97% 98% 99% 99%  Weight:      Height:        Intake/Output Summary (Last 24 hours) at 07/09/2021 0939 Last data filed at 07/09/2021 0700 Gross per 24 hour  Intake 584.35 ml  Output 170 ml  Net 414.35 ml      07/09/2021    7:00 AM 07/08/2021    4:49 AM 07/07/2021   12:38 PM  Last 3 Weights  Weight (lbs) 176 lb 9.6 oz 157 lb 6.5 oz 106 lb 7.7 oz  Weight (kg) 80.105 kg 71.4 kg 48.3 kg      Telemetry    Sinus rhythm, 1st degree AVB, junctional beats? - Personally Reviewed  ECG    No new tracing  Physical Exam   GEN: No acute distress.   Neck: No JVD Cardiac: RRR, no  murmurs, rubs, or gallops. Dressing to right upper chest. Respiratory: Clear to auscultation bilaterally. GI: Soft, nontender, non-distended  MS: No edema; No deformity. Neuro:  Nonfocal  Psych: Normal affect   Labs    High Sensitivity Troponin:   Recent Labs  Lab 07/07/21 0347 07/07/21 0608 07/07/21 1134  TROPONINIHS 8,353* 7,977* 10,105*     Chemistry Recent Labs  Lab 07/07/21 0347 07/08/21 0133 07/09/21 0340  NA 129* 130* 128*  K 4.0 4.6 4.1  CL 99 102 98  CO2 20* 18* 20*  GLUCOSE 215* 117* 125*  BUN 36* 45* 59*  CREATININE 2.43* 3.20* 4.56*  CALCIUM 8.0* 8.0* 8.2*  MG 1.5*  --  1.7  PROT 6.1* 5.6* 5.9*  ALBUMIN 2.6* 2.2* 2.4*  AST 92* 85* 62*  ALT 33 27 26  ALKPHOS 80 76 79  BILITOT 2.0* 1.7* 1.3*  GFRNONAA 26* 19* 12*  ANIONGAP 10 10 10     Lipids No results for input(s): CHOL, TRIG, HDL, LABVLDL, LDLCALC, CHOLHDL in the last 168 hours.  Hematology Recent Labs  Lab 07/07/21 [phone] 07/08/21 0133  07/09/21 0340  WBC 10.9* 10.2 9.1  RBC 2.75* 2.64* 2.71*  HGB 9.4* 8.8* 9.2*  HCT 27.0* 26.3* 27.0*  MCV 98.2 99.6 99.6  MCH 34.2* 33.3 33.9  MCHC 34.8 33.5 34.1  RDW 20.1* 20.7* 21.2*  PLT 79* 51* 85*   Thyroid No results for input(s): TSH, FREET4 in the last 168 hours.  BNPNo results for input(s): BNP, PROBNP in the last 168 hours.  DDimer No results for input(s): DDIMER in the last 168 hours.   Radiology    MR FEMUR LEFT WO CONTRAST  Result Date: 07/09/2021 CLINICAL DATA:  History of left hip ORIF. Evaluate soft tissue infection. EXAM: MR OF THE LEFT FEMUR WITHOUT CONTRAST TECHNIQUE: Multiplanar, multisequence MR imaging of the left femur was performed. No intravenous contrast was administered. COMPARISON:  None Available. FINDINGS: Bones/Joint/Cartilage No acute fracture or dislocation. Prior ORIF of a healed intertrochanteric fracture with susceptibility artifact partially obscuring the adjacent soft tissue and osseous structures. Prior ORIF of a left  femoral diaphysis fracture transfixed with a lateral sideplate and multiple interlocking screws with susceptibility artifact partially obscuring the adjacent soft tissue and osseous structures. No aggressive osseous lesion. Normal alignment. No joint effusion. No marrow signal abnormality. Partial-thickness cartilage loss of the left femoral head and acetabulum. Right total hip arthroplasty partially visualized. No periarticular fluid collection or osteolysis. Muscles and Tendons Mild edema in the tensor fascia lata and vastus intermedius muscle adjacent to the hip concerning for mild myositis with surrounding soft tissue edema between the muscle planes. Mild muscle edema in the distal vastus lateralis muscle concerning for myositis versus muscle strain. No intramuscular fluid collection or hematoma. Soft tissue Generalized anasarca. No soft tissue mass. No fluid collection or hematoma. IMPRESSION: 1. No evidence of septic arthritis of the left hip. Mild-moderate osteoarthritis of the left hip. 2. Mild edema in the tensor fascia lata and vastus intermedius muscle adjacent to the hip concerning for mild myositis with surrounding soft tissue edema between the muscle planes. Mild muscle edema in the distal vastus lateralis muscle concerning for myositis versus muscle strain. No fluid collection to suggest an abscess. 3. Evaluation for infection related to the hardware is extremely limited given the degree of artifact resulting from the metallic hardware. Electronically Signed   By: Kathreen Devoid M.D.   On: 07/09/2021 08:34   MR KNEE LEFT WO CONTRAST  Result Date: 07/09/2021 CLINICAL DATA:  History of knee replacement. Evaluate for infection. EXAM: MRI OF THE LEFT KNEE WITHOUT CONTRAST TECHNIQUE: Multiplanar, multisequence MR imaging of the knee was performed. No intravenous contrast was administered. COMPARISON:  None Available. FINDINGS: Bones/Joint/Cartilage No acute fracture or dislocation. Left total knee  arthroplasty with susceptibility artifact severely limiting evaluation of the adjacent soft tissue and osseous structures. No periarticular fluid collection, hematoma or osteolysis. No evidence of hardware failure or complication within limitations of the artifact. Normal alignment. No joint effusion. No marrow signal abnormality. Muscles and Tendons Soft tissue edema involving the distal aspect of the vastus lateralis muscle concerning for muscle strain versus myositis. No intramuscular fluid collection or hematoma. Quadriceps tendon and patellar tendon are intact. Soft tissue No fluid collection or hematoma. No soft tissue mass. Generalized anasarca. IMPRESSION: 1. 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 No periarticular fluid collection, hematoma or osteolysis. No evidence of hardware failure or complication. 2. Soft tissue edema involving the distal aspect of the vastus lateralis muscle concerning for muscle strain versus  myositis. Electronically Signed   By: Kathreen Devoid M.D.   On: 07/09/2021 08:37   ECHOCARDIOGRAM COMPLETE  Result Date: 07/07/2021    ECHOCARDIOGRAM REPORT   Patient Name:   LEVERNE AMRHEIN Date of Exam: 07/07/2021 Medical Rec #:  [id number]    Height:       67.0 in Accession #:    [id number]   Weight:       106.5 lb Date of Birth:  Nov 28, 1940     BSA:          1.547 m Patient Age:    30 years     BP:           99/68 mmHg Patient Gender: M            HR:           93 bpm. Exam Location:  Inpatient Procedure: 2D Echo, Cardiac Doppler and Color Doppler Indications:    Elevated troponin  History:        Patient has no prior history of Echocardiogram examinations.  Sonographer:    McCurtain Referring Phys: [id number] Macy  1. Left ventricular ejection fraction, by estimation, is 45-50%. The left ventricle is mildly decreased. The left ventricle demonstrates regional wall  motion abnormalities (mid-apical septal hypokinesis). 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 Left ventricular diastolic parameters are consistent with Grade I diastolic dysfunction (impaired relaxation).  2. Right ventricular systolic function is normal. The right ventricular size is mildly enlarged.  3. The mitral valve is grossly normal. Mild to moderate mitral valve regurgitation. Moderate mitral annular calcification. Pulmonary vein blunting, there are two jets.  4. Tricuspid valve regurgitation is mild-moderate, eccentric, and functional.  5. The aortic valve is tricuspid. Aortic valve regurgitation is not visualized. No aortic stenosis is present. Comparison(s): No prior Echocardiogram. FINDINGS  Left Ventricle: Left ventricular ejection fraction, by estimation, is 45 to 50%. The left ventricle has mildly decreased function. The left ventricle demonstrates regional wall motion abnormalities. The left ventricular internal cavity size was normal in size. 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 Left ventricular diastolic parameters are consistent with Grade I diastolic dysfunction (impaired relaxation).  LV Wall Scoring: The mid and distal anterior septum and mid inferoseptal segment are hypokinetic. Right Ventricle: The right ventricular size is mildly enlarged. No increase in right ventricular wall thickness. Right ventricular systolic function is normal. Left Atrium: Left atrial size was normal in size. Right Atrium: Right atrial size was normal in size. Pericardium: There is no evidence of pericardial effusion. Mitral Valve: The mitral valve is grossly normal. Moderate mitral annular calcification. Mild to moderate mitral valve regurgitation. Tricuspid Valve: Eccentric. The tricuspid valve is normal in structure. Tricuspid valve regurgitation is mild to moderate. Aortic Valve: The aortic valve is tricuspid. Aortic valve  regurgitation is not visualized. No aortic stenosis is present. Aortic valve mean gradient measures 4.0 mmHg. Aortic valve peak gradient measures 8.0 mmHg. Aortic valve area, by VTI measures 2.02 cm. Pulmonic Valve: The pulmonic valve was not well visualized. Pulmonic valve regurgitation is not visualized. No evidence of pulmonic stenosis. Aorta: The aortic root and ascending aorta are structurally normal, with no evidence of dilitation. IAS/Shunts: No atrial level shunt detected by color flow Doppler.  LEFT VENTRICLE PLAX 2D LVIDd:         4.30 cm     Diastology LVIDs:         3.30  cm     LV e' medial:    6.09 cm/s LV PW:         1.30 cm     LV E/e' medial:  15.3 LV IVS:        1.60 cm     LV e' lateral:   8.16 cm/s LVOT diam:     2.20 cm     LV E/e' lateral: 11.4 LV SV:         49 LV SV Index:   32 LVOT Area:     3.80 cm  LV Volumes (MOD) LV vol d, MOD A2C: 92.7 ml LV vol d, MOD A4C: 92.5 ml LV vol s, MOD A2C: 45.6 ml LV vol s, MOD A4C: 33.0 ml LV SV MOD A2C:     47.1 ml LV SV MOD A4C:     92.5 ml LV SV MOD BP:      53.9 ml RIGHT VENTRICLE RV Basal diam:  4.40 cm RV Mid diam:    4.00 cm RV S prime:     7.51 cm/s TAPSE (M-mode): 1.7 cm LEFT ATRIUM             Index        RIGHT ATRIUM           Index LA diam:        2.30 cm 1.49 cm/m   RA Area:     17.60 cm LA Vol (A2C):   32.6 ml 21.07 ml/m  RA Volume:   39.50 ml  25.53 ml/m LA Vol (A4C):   40.0 ml 25.86 ml/m LA Biplane Vol: 38.2 ml 24.69 ml/m  AORTIC VALVE                    PULMONIC VALVE AV Area (Vmax):    1.97 cm     PV Vmax:       0.86 m/s AV Area (Vmean):   1.90 cm     PV Peak grad:  3.0 mmHg AV Area (VTI):     2.02 cm AV Vmax:           141.00 cm/s AV Vmean:          95.800 cm/s AV VTI:            0.243 m AV Peak Grad:      8.0 mmHg AV Mean Grad:      4.0 mmHg LVOT Vmax:         73.20 cm/s LVOT Vmean:        47.900 cm/s LVOT VTI:          0.129 m LVOT/AV VTI ratio: 0.53  AORTA Ao Root diam: 3.20 cm Ao Asc diam:  3.40 cm MITRAL VALVE                 TRICUSPID VALVE MV Area (PHT): 7.74 cm     TR Peak grad:   30.9 mmHg MV Decel Time: 98 msec      TR Vmax:        278.00 cm/s MR Peak grad: 78.5 mmHg MR Mean grad: 50.0 mmHg     SHUNTS MR Vmax:      443.00 cm/s   Systemic VTI:  0.13 m MR Vmean:     328.0 cm/s    Systemic Diam: 2.20 cm MV E velocity: 93.10 cm/s MV A velocity: 108.00 cm/s MV E/A ratio:  0.86 Rudean Haskell MD Electronically signed by Rudean Haskell MD Signature Date/Time: 07/07/2021/12:18:58 PM  Final    US Abdomen Limited RUQ (LIVER/GB)  Result Date: 07/07/2021 CLINICAL DATA:  Evaluate for choledocholithiasis. EXAM: ULTRASOUND ABDOMEN LIMITED RIGHT UPPER QUADRANT COMPARISON:  CT 07/07/2021 FINDINGS: Gallbladder: Large stone within the gallbladder measures 1.4 cm. Debris/sludge noted within the gallbladder. Mild gallbladder wall thickening measures 4.4 mm. Negative sonographic Murphy's sign. Common bile duct: Diameter: 5 mm Liver: Increased parenchymal echogenicity. Portal vein is patent on color Doppler imaging with normal direction of blood flow towards the liver. Other: None. IMPRESSION: 1. Gallstone and sludge noted within gallbladder. Mild gallbladder wall thickening. Note: In the setting of cirrhosis (as suggested on CT from earlier today), gallbladder wall thickening may be a nonspecific finding. If there is a high suspicion for acute cholecystitis consider further investigation with nuclear medicine hepatic biliary scan. 2. Increased parenchymal echogenicity suggestive of hepatic steatosis. 3. No biliary ductal dilatation identified. Electronically Signed   By: Kerby Moors M.D.   On: 07/07/2021 10:04    Cardiac Studies   Echo: 07/07/21   IMPRESSIONS     1. Left ventricular ejection fraction, by estimation, is 45-50%. The left  ventricle is mildly decreased. The left ventricle demonstrates regional  wall motion abnormalities (mid-apical septal hypokinesis). There is severe  asymmetric left ventricular   hypertrophy of the basal-septal segment. Left ventricular diastolic  parameters are consistent with Grade I diastolic dysfunction (impaired  relaxation).   2. Right ventricular systolic function is normal. The right ventricular  size is mildly enlarged.   3. The mitral valve is grossly normal. Mild to moderate mitral valve  regurgitation. Moderate mitral annular calcification. Pulmonary vein  blunting, there are two jets.   4. Tricuspid valve regurgitation is mild-moderate, eccentric, and  functional.   5. The aortic valve is tricuspid. Aortic valve regurgitation is not  visualized. No aortic stenosis is present.   Comparison(s): No prior Echocardiogram.   FINDINGS   Left Ventricle: Left ventricular ejection fraction, by estimation, is 45  to 50%. The left ventricle has mildly decreased function. The left  ventricle demonstrates regional wall motion abnormalities. The left  ventricular internal cavity size was normal  in size. There is severe asymmetric left ventricular hypertrophy of the  basal-septal segment. Left ventricular diastolic parameters are consistent  with Grade I diastolic dysfunction (impaired relaxation).      LV Wall Scoring:  The mid and distal anterior septum and mid inferoseptal segment are  hypokinetic.   Right Ventricle: The right ventricular size is mildly enlarged. No  increase in right ventricular wall thickness. Right ventricular systolic  function is normal.   Left Atrium: Left atrial size was normal in size.   Right Atrium: Right atrial size was normal in size.   Pericardium: There is no evidence of pericardial effusion.   Mitral Valve: The mitral valve is grossly normal. Moderate mitral annular  calcification. Mild to moderate mitral valve regurgitation.   Tricuspid Valve: Eccentric. The tricuspid valve is normal in structure.  Tricuspid valve regurgitation is mild to moderate.   Aortic Valve: The aortic valve is tricuspid. Aortic valve  regurgitation is  not visualized. No aortic stenosis is present. Aortic valve mean gradient  measures 4.0 mmHg. Aortic valve peak gradient measures 8.0 mmHg. Aortic  valve area, by VTI measures 2.02  cm.   Pulmonic Valve: The pulmonic valve was not well visualized. Pulmonic valve  regurgitation is not visualized. No evidence of pulmonic stenosis.   Aorta: The aortic root and ascending aorta are structurally normal, with  no evidence of  dilitation.   IAS/Shunts: No atrial level shunt detected by color flow Doppler.   Patient Profile     81 y.o. male with a hx of Atrial fibrillation on eilquis, s/p left hip fracture in 2022, T2DM, HTN, CKD, hx of tobacco and alcohol use who was seen 07/07/2021 for the evaluation of abnormal ECG at the request of Dr. Dina Rich.  Assessment & Plan    Abnormal EKG/Chest pain/NSTEMI: initial EKG concerning for inferior ST changes. CODE STEMI canceled. Recommendations were to treat medically. hsTn [id number]. Follow up echo showed LVEF 45-50% with mid and distal anterior septum and mid inferoseptal segment hypokinesis. Not a candidate for invasive work up at this time with febrile illness, worsening renal function, thrombocytopenia, etc.  -- has been on IV heparin 48hrs (now completed), continue 81mg  ASA, and statin. Blood pressures remain borderline soft, will defer addition of BB at this time.    Sepsis/hypotension: febrile on admission, source unclear. MRSA bacteremia, ID following. Recs to remove port-a-cath -- treated with IV antibiotics and IVFs -- per primary/ID -- will need TEE, scheduled for tomorrow. He received pain meds this morning to have port-a-cath removed and unable to consent at this time. Will follow up.   AKI with CKD stage IIIa: previously Cr 1.3, Cr 2.43>>3.2>>4.5, unfortunately Cr continues to worsen. 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 -- Recent CT at the [place]   DM: Hgb A1c 7.5 -- SSI while inpatient   Hx of Afib: notes indicate he was on  eliquis PTA -- last dose 5/22, heparin completed today. No plans for cardiac cath at this time.  -- appears to be mostly in sinus rhythm, suspect junctional beats are being interpreted at afib on telemetry -- will continue IV heparin for now as his renal function has worsened, unclear whether he will need other invasive procedures this admission.   Metastatic Colon Ca s/p sigmoidectomy: had PET scan on 2/23 concerning for metastatic disease with enlarging pulmonary nodule. 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 AST 92. -- RUQ [place] with gallstone and sludge noted within gallbladder. Mild gallbladder wall thickening. Recs to consider further imaging   For questions or updates, please contact [facility] Please consult [email or web address] for contact info under        Signed, Reino Bellis, NP  07/09/2021, 9:39 AM    I have examined the patient and reviewed assessment and plan and discussed with patient.  Agree with above as stated.  Worsening renal function in the setting of sepsis with underlying liver disease/colon CA. No further cardiac testing planned- not currently a candidate for cath. COntinue supportive care.   [facility] has been requested to perform a transesophageal echocardiogram on 5/26 for bacteremia.  After careful review of history and examination, the risks and benefits of transesophageal echocardiogram have been explained including risks of esophageal damage, perforation (1:10,000 risk), bleeding, pharyngeal hematoma as well as other potential complications associated with conscious sedation including aspiration, arrhythmia, respiratory failure and death. Alternatives to treatment were discussed, questions were answered. Patient is willing to  proceed.   Reino Bellis, NP-C 07/09/2021 5:14 PM

## 2021-07-09 NOTE — Progress Notes (Signed)
Chief Complaint: Patient was seen in consultation today for port removal   Referring Physician(s): Shawna Clamp MD Janene Madeira NP (ID)  Supervising Physician: Mir, Sharen Heck  Patient Status: [facility] - In-pt  History of Present Illness: Corey Gonzalez is a 81 y.o. male with hx of colon cancer s/p resection and chemotherapy. Port placed at outside facility last year. Admitted with fever and + bacteremia. Some drainage from port site recently. Also recent hx of LE surgery. ID recommends port removal PMHx, meds, labs, imaging, allergies reviewed. Has been NPO today as directed.    Past Medical History:  Diagnosis Date   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[place])    Diabetes mellitus without complication ([place])    Hypertension    Lung cancer ([place])     Past Surgical History:  Procedure Laterality Date   COLOSTOMY     INTRAMEDULLARY (IM) NAIL INTERTROCHANTERIC Left 09/15/2019   Procedure: INTRAMEDULLARY (IM) NAIL INTERTROCHANTRIC;  Surgeon: Leim Fabry, MD;  Location: [facility] ORS;  Service: Orthopedics;  Laterality: Left;   JOINT REPLACEMENT     bilateral knees   JOINT REPLACEMENT Right    hip   ORIF FEMUR FRACTURE Left 12/31/2020   Procedure: OPEN REDUCTION INTERNAL FIXATION (ORIF) DISTAL FEMUR FRACTURE - PERIPROSTHETIC FRACTURE;  Surgeon: Hessie Knows, MD;  Location: [facility] ORS;  Service: Orthopedics;  Laterality: Left;    Allergies: Patient has no known allergies.  Medications:  Current Facility-Administered Medications:    0.9 %  sodium chloride infusion, , Intravenous, PRN, Shawna Clamp, MD, Last Rate: 10 mL/hr at 07/08/21 1804, Infusion Verify at 07/08/21 1804   acetaminophen (TYLENOL) tablet 650 mg, 650 mg, Oral, Q6H PRN **OR** acetaminophen (TYLENOL) suppository 650 mg, 650 mg, Rectal, Q6H PRN, Howerter, Justin B, DO   albuterol (PROVENTIL) (2.5 MG/3ML) 0.083% nebulizer solution 2.5 mg, 2.5 mg, Nebulization, Q6H PRN, Tamala Julian, Rondell A, MD   ascorbic acid (VITAMIN C) tablet 500 mg, 500 mg,  Oral, Daily, Smith, Rondell A, MD, 500 mg at 07/08/21 0838   atorvastatin (LIPITOR) tablet 40 mg, 40 mg, Oral, QHS, Reino Bellis B, NP, 40 mg at 07/08/21 2155   bisacodyl (DULCOLAX) suppository 10 mg, 10 mg, Rectal, Daily PRN, Tamala Julian, Rondell A, MD   Chlorhexidine Gluconate Cloth 2 % PADS 6 each, 6 each, Topical, Q0600, Shawna Clamp, MD, 6 each at 07/09/21 0641   DAPTOmycin (CUBICIN) 550 mg in sodium chloride 0.9 % IVPB, 8 mg/kg, Intravenous, Q48H, [place], [place], [place], Last Rate: 122 mL/hr at 07/08/21 2045, 550 mg at 07/08/21 2045   docusate sodium (COLACE) capsule 100 mg, 100 mg, Oral, BID, Smith, Rondell A, MD, 100 mg at 07/08/21 2156   DULoxetine (CYMBALTA) DR capsule 20 mg, 20 mg, Oral, Daily, Smith, Rondell A, MD, 20 mg at 07/08/21 8756   ferrous sulfate tablet 325 mg, 325 mg, Oral, Q breakfast, Tamala Julian, Rondell A, MD, 325 mg at 07/08/21 4332   HYDROcodone-acetaminophen (NORCO/VICODIN) 5-325 MG per tablet 1-2 tablet, 1-2 tablet, Oral, Q6H PRN, Fuller Plan A, MD, 2 tablet at 07/09/21 0310   insulin aspart (novoLOG) injection 0-6 Units, 0-6 Units, Subcutaneous, TID WC, Smith, Rondell A, MD, 1 Units at 07/08/21 1711   insulin aspart (novoLOG) injection 3 Units, 3 Units, Subcutaneous, TID WC, Smith, Rondell A, MD, 3 Units at 07/08/21 1712   magic mouthwash w/lidocaine, 5 mL, Oral, TID PRN, Tamala Julian, Rondell A, MD, 5 mL at 07/07/21 1511   melatonin tablet 3 mg, 3 mg, Oral, QHS, Smith, Rondell A, MD, 3 mg at  07/08/21 2155   mupirocin ointment (BACTROBAN) 2 % 1 application., 1 application., Nasal, BID, Shawna Clamp, MD, 1 application. at 07/09/21 0330   ondansetron (ZOFRAN) tablet 4 mg, 4 mg, Oral, Q6H PRN **OR** ondansetron (ZOFRAN) injection 4 mg, 4 mg, Intravenous, Q6H PRN, Smith, Rondell A, MD   pantoprazole (PROTONIX) EC tablet 40 mg, 40 mg, Oral, Daily, Smith, Rondell A, MD, 40 mg at 07/08/21 1749   polyethylene glycol (MIRALAX / GLYCOLAX) packet 17 g, 17 g, Oral, Daily, Smith, Rondell  A, MD, 17 g at 07/08/21 4496   senna-docusate (Senokot-S) tablet 1 tablet, 1 tablet, Oral, QHS PRN, Tamala Julian, Rondell A, MD   sodium chloride flush (NS) 0.9 % injection 3 mL, 3 mL, Intravenous, Q12H, Smith, Rondell A, MD, 3 mL at 07/08/21 2156   tamsulosin (FLOMAX) capsule 0.4 mg, 0.4 mg, Oral, Daily, Tamala Julian, Rondell A, MD, 0.4 mg at 07/08/21 7591    Family History  Problem Relation Age of Onset   Diabetes Other     Social History   Socioeconomic History   Marital status: Single    Spouse name: Not on file   Number of children: Not on file   Years of education: Not on file   Highest education level: Not on file  Occupational History   Not on file  Tobacco Use   Smoking status: Some Days   Smokeless tobacco: Never   Tobacco comments:    ~5 per week  Vaping Use   Vaping Use: Never used  Substance and Sexual Activity   Alcohol use: Not Currently    Alcohol/week: 35.0 standard drinks    Types: 35 Standard drinks or equivalent per week   Drug use: Not on file   Sexual activity: Not on file  Other Topics Concern   Not on file  Social History Narrative   Not on file   Social Determinants of Health   Financial Resource Strain: Not on file  Food Insecurity: Not on file  Transportation Needs: Not on file  Physical Activity: Not on file  Stress: Not on file  Social Connections: Not on file    Review of Systems: A 12 point ROS discussed and pertinent positives are indicated in the HPI above.  All other systems are negative.  Review of Systems  Vital Signs: BP (!) 97/50 (BP Location: Right Arm)   Pulse 95   Temp 97.9 F (36.6 C) (Oral)   Resp 19   Ht 5\' 7"  (1.702 m)   Wt 80.1 kg   SpO2 100%   BMI 27.66 kg/m   Physical Exam Constitutional:      Appearance: He is ill-appearing. He is not toxic-appearing.  Cardiovascular:     Rate and Rhythm: Normal rate and regular rhythm.     Heart sounds: Normal heart sounds.  Pulmonary:     Effort: Pulmonary effort is normal.  No respiratory distress.     Breath sounds: Normal breath sounds.  Skin:    Comments: (R)upper chest port site with tender erythema. Some dry crusting at at incision line.  Neurological:     General: No focal deficit present.     Mental Status: He is alert and oriented to person, place, and time.    Imaging: DG Chest Port 1 View  Result Date: 07/07/2021 CLINICAL DATA:  Questionable sepsis. EXAM: PORTABLE CHEST 1 VIEW COMPARISON:  12/30/2020. FINDINGS: The heart is enlarged and the mediastinal contours within normal limits. Atherosclerotic calcification of the aorta is noted. The distal tip of  right chest port terminates at the cavoatrial junction. Lung volumes are low. Mild atelectasis is noted at the lung bases bilaterally. There is a trace right pleural effusion. No pneumothorax. No acute osseous abnormality. IMPRESSION: 1. Low lung volumes with mild atelectasis at the lung bases. 2. Trace right pleural effusion. 3. Cardiomegaly. Electronically Signed   By: Brett Fairy M.D.   On: 07/07/2021 04:18   ECHOCARDIOGRAM COMPLETE  Result Date: 07/07/2021    ECHOCARDIOGRAM REPORT   Patient Name:   ALARIC GLADWIN Date of Exam: 07/07/2021 Medical Rec #:  [id number]    Height:       67.0 in Accession #:    [id number]   Weight:       106.5 lb Date of Birth:  12/02/1940     BSA:          1.547 m Patient Age:    50 years     BP:           99/68 mmHg Patient Gender: M            HR:           93 bpm. Exam Location:  Inpatient Procedure: 2D Echo, Cardiac Doppler and Color Doppler Indications:    Elevated troponin  History:        Patient has no prior history of Echocardiogram examinations.  Sonographer:    Lafe Referring Phys: [id number] [place]  1. Left ventricular ejection fraction, by estimation, is 45-50%. The left ventricle is mildly decreased. The left ventricle demonstrates regional wall motion abnormalities (mid-apical septal hypokinesis). There is severe asymmetric left  ventricular hypertrophy of the basal-septal segment. Left ventricular diastolic parameters are consistent with Grade I diastolic dysfunction (impaired relaxation).  2. Right ventricular systolic function is normal. The right ventricular size is mildly enlarged.  3. The mitral valve is grossly normal. Mild to moderate mitral valve regurgitation. Moderate mitral annular calcification. Pulmonary vein blunting, there are two jets.  4. Tricuspid valve regurgitation is mild-moderate, eccentric, and functional.  5. The aortic valve is tricuspid. Aortic valve regurgitation is not visualized. No aortic stenosis is present. Comparison(s): No prior Echocardiogram. FINDINGS  Left Ventricle: Left ventricular ejection fraction, by estimation, is 45 to 50%. The left ventricle has mildly decreased function. The left ventricle demonstrates regional wall motion abnormalities. The left ventricular internal cavity size was normal in size. 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 Left ventricular diastolic parameters are consistent with Grade I diastolic dysfunction (impaired relaxation).  LV Wall Scoring: The mid and distal anterior septum and mid inferoseptal segment are hypokinetic. Right Ventricle: The right ventricular size is mildly enlarged. No increase in right ventricular wall thickness. Right ventricular systolic function is normal. Left Atrium: Left atrial size was normal in size. Right Atrium: Right atrial size was normal in size. Pericardium: There is no evidence of pericardial effusion. Mitral Valve: The mitral valve is grossly normal. Moderate mitral annular calcification. Mild to moderate mitral valve regurgitation. Tricuspid Valve: Eccentric. The tricuspid valve is normal in structure. Tricuspid valve regurgitation is mild to moderate. Aortic Valve: The aortic valve is tricuspid. Aortic valve regurgitation is not visualized. No aortic stenosis is present. Aortic valve mean  gradient measures 4.0 mmHg. Aortic valve peak gradient measures 8.0 mmHg. Aortic valve area, by VTI measures 2.02 cm. Pulmonic Valve: The pulmonic valve was not well visualized. Pulmonic valve regurgitation is not visualized. No evidence of pulmonic stenosis. Aorta: The aortic root  and ascending aorta are structurally normal, with no evidence of dilitation. IAS/Shunts: No atrial level shunt detected by color flow Doppler.  LEFT VENTRICLE PLAX 2D LVIDd:         4.30 cm     Diastology LVIDs:         3.30 cm     LV e' medial:    6.09 cm/s LV PW:         1.30 cm     LV E/e' medial:  15.3 LV IVS:        1.60 cm     LV e' lateral:   8.16 cm/s LVOT diam:     2.20 cm     LV E/e' lateral: 11.4 LV SV:         49 LV SV Index:   32 LVOT Area:     3.80 cm  LV Volumes (MOD) LV vol d, MOD A2C: 92.7 ml LV vol d, MOD A4C: 92.5 ml LV vol s, MOD A2C: 45.6 ml LV vol s, MOD A4C: 33.0 ml LV SV MOD A2C:     47.1 ml LV SV MOD A4C:     92.5 ml LV SV MOD BP:      53.9 ml RIGHT VENTRICLE RV Basal diam:  4.40 cm RV Mid diam:    4.00 cm RV S prime:     7.51 cm/s TAPSE (M-mode): 1.7 cm LEFT ATRIUM             Index        RIGHT ATRIUM           Index LA diam:        2.30 cm 1.49 cm/m   RA Area:     17.60 cm LA Vol (A2C):   32.6 ml 21.07 ml/m  RA Volume:   39.50 ml  25.53 ml/m LA Vol (A4C):   40.0 ml 25.86 ml/m LA Biplane Vol: 38.2 ml 24.69 ml/m  AORTIC VALVE                    PULMONIC VALVE AV Area (Vmax):    1.97 cm     PV Vmax:       0.86 m/s AV Area (Vmean):   1.90 cm     PV Peak grad:  3.0 mmHg AV Area (VTI):     2.02 cm AV Vmax:           141.00 cm/s AV Vmean:          95.800 cm/s AV VTI:            0.243 m AV Peak Grad:      8.0 mmHg AV Mean Grad:      4.0 mmHg LVOT Vmax:         73.20 cm/s LVOT Vmean:        47.900 cm/s LVOT VTI:          0.129 m LVOT/AV VTI ratio: 0.53  AORTA Ao Root diam: 3.20 cm Ao Asc diam:  3.40 cm MITRAL VALVE                TRICUSPID VALVE MV Area (PHT): 7.74 cm     TR Peak grad:   30.9 mmHg MV Decel  Time: 98 msec      TR Vmax:        278.00 cm/s MR Peak grad: 78.5 mmHg MR Mean grad: 50.0 mmHg     SHUNTS MR Vmax:  443.00 cm/s   Systemic VTI:  0.13 m MR Vmean:     328.0 cm/s    Systemic Diam: 2.20 cm MV E velocity: 93.10 cm/s MV A velocity: 108.00 cm/s MV E/A ratio:  0.86 Rudean Haskell MD Electronically signed by Rudean Haskell MD Signature Date/Time: 07/07/2021/12:18:58 PM    Final    CT CHEST ABDOMEN PELVIS WO CONTRAST  Result Date: 07/07/2021 CLINICAL DATA:  Chest wall pain, nontraumatic, with infection or inflammation suspected. Fever EXAM: CT CHEST, ABDOMEN AND PELVIS WITHOUT CONTRAST TECHNIQUE: Multidetector CT imaging of the chest, abdomen and pelvis was performed following the standard protocol without IV contrast. RADIATION DOSE REDUCTION: This exam was performed according to the departmental dose-optimization program which includes automated exposure control, adjustment of the mA and/or kV according to patient size and/or use of iterative reconstruction technique. COMPARISON:  None Available. FINDINGS: CT CHEST FINDINGS Cardiovascular: Normal heart size. No pericardial effusion. Extensive atheromatous calcification of the aorta and coronaries. Right-sided porta catheter with tip at the upper cavoatrial junction. Mediastinum/Nodes: Negative for mass or adenopathy Lungs/Pleura: Dependent atelectasis. There is no edema, consolidation, effusion, or pneumothorax. Musculoskeletal: No acute finding. Gynecomastia. CT ABDOMEN PELVIS FINDINGS Hepatobiliary: Lobulated liver with large caudate lobe and fissures. Cholelithiasis without findings of acute cholecystitis. Pancreas: Generalized atrophy. Spleen: Mildly enlarged with 14 cm craniocaudal span. Adrenals/Urinary Tract: 2.8 cm right adrenal mass. Heterogeneity of renal cortex with patchy high-density areas bilaterally. Contrast is being excreted from unknown prior study. Negative bladder. Stomach/Bowel: Descending colostomy. Negative for  bowel obstruction or visible inflammation. Vascular/Lymphatic: Atheromatous calcification of the aorta and branch vessels. 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 IMPRESSION: 1. No acute finding. 2. 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 History of lung cancer, recommend follow-up with prior staging scans. 3. Recent contrast administration from unknown procedure. Patchy renal cortical density could be from ATN or scarring. 4. Cirrhotic appearance of the liver. Please correlate for risk factors. 5.  Aortic Atherosclerosis (ICD10-I70.0). 6. Cholelithiasis. Electronically Signed   By: Jorje Guild M.D.   On: 07/07/2021 06:03   US Abdomen Limited RUQ (LIVER/GB)  Result Date: 07/07/2021 CLINICAL DATA:  Evaluate for choledocholithiasis. EXAM: ULTRASOUND ABDOMEN LIMITED RIGHT UPPER QUADRANT COMPARISON:  CT 07/07/2021 FINDINGS: Gallbladder: Large stone within the gallbladder measures 1.4 cm. Debris/sludge noted within the gallbladder. Mild gallbladder wall thickening measures 4.4 mm. Negative sonographic Murphy's sign. Common bile duct: Diameter: 5 mm Liver: Increased parenchymal echogenicity. Portal vein is patent on color Doppler imaging with normal direction of blood flow towards the liver. Other: None. IMPRESSION: 1. Gallstone and sludge noted within gallbladder. Mild gallbladder wall thickening. Note: In the setting of cirrhosis (as suggested on CT from earlier today), gallbladder wall thickening may be a nonspecific finding. If there is a high suspicion for acute cholecystitis consider further investigation with nuclear medicine hepatic biliary scan. 2. Increased parenchymal echogenicity suggestive of hepatic steatosis. 3. No biliary ductal dilatation identified. Electronically Signed   By: Kerby Moors M.D.   On:  07/07/2021 10:04    Labs:  CBC: Recent Labs    03/27/21 1333 07/07/21 0347 07/08/21 0133 07/09/21 0340  WBC 5.6 10.9* 10.2 9.1  HGB 9.0* 9.4* 8.8* 9.2*  HCT 28.8* 27.0* 26.3* 27.0*  PLT 154 79* 51* 85*    COAGS: Recent Labs    03/27/21 1333 07/07/21 0347 07/07/21 1338 07/07/21 2230 07/08/21  1937 07/08/21 1800 07/09/21 0340  INR 1.3* 2.3*  --   --   --   --   --   APTT  --  41*   < > 146* 81* 91* 104*   < > = values in this interval not displayed.    BMP: Recent Labs    03/27/21 1333 07/07/21 0347 07/08/21 0133 07/09/21 0340  NA 137 129* 130* 128*  K 4.2 4.0 4.6 4.1  CL 105 99 102 98  CO2 25 20* 18* 20*  GLUCOSE 184* 215* 117* 125*  BUN 30* 36* 45* 59*  CALCIUM 8.8* 8.0* 8.0* 8.2*  CREATININE 1.38* 2.43* 3.20* 4.56*  GFRNONAA 52* 26* 19* 12*    LIVER FUNCTION TESTS: Recent Labs    03/27/21 1333 07/07/21 0347 07/08/21 0133 07/09/21 0340  BILITOT 1.0 2.0* 1.7* 1.3*  AST 22 92* 85* 62*  ALT 17 33 27 26  ALKPHOS 105 80 76 79  PROT 6.8 6.1* 5.6* 5.9*  ALBUMIN 3.1* 2.6* 2.2* 2.4*    TUMOR MARKERS: No results for input(s): AFPTM, CEA, CA199, CHROMGRNA in the last 8760 hours.  Assessment and Plan: Bacteremia For port removal. Has PIV Risks and benefits of port-a-catheter removal was discussed with the patient including, but not limited to bleeding, infection, or fibrin sheath development and need for additional procedures.  All of the patient's questions were answered, patient is agreeable to proceed. Consent signed and in chart.   Thank you for this interesting consult.  I greatly enjoyed meeting DEROY NOAH and look forward to participating in their care.  A copy of this report was sent to the requesting provider on this date.  Electronically Signed: Ascencion Dike, PA-C 07/09/2021, 8:15 AM   I spent a total of 20 minutes in face to face in clinical consultation, greater than 50% of which was counseling/coordinating care for port  removal

## 2021-07-09 NOTE — Progress Notes (Signed)
During bedside shift report went into pt's room pt has been yelling out for someone to come into room. Pt was found hysterically crying inconsolably stating, "I want to die, and I am tired". Attempted to console pt and explained to pt will get Chaplain to come and be with pt and page Hospitalist as well.   2025- Chaplain updated on details of pt's current prognosis and situation and came to be with pt at bedside. Received call back from Dr. Hal Hope to given pt a one time dose of Xanax 0.25 mg PO. Informed pt about medication and pt was receptive to take.   Will continue to monitor pt very closely.

## 2021-07-09 NOTE — Progress Notes (Signed)
ANTICOAGULATION CONSULT NOTE -  Pharmacy Consult for Heparin Indication: chest pain/ACS and atrial fibrillation   No Known Allergies  Patient Measurements: Height: 5\' 7"  (170.2 cm) Weight: 80.1 kg (176 lb 9.6 oz) IBW/kg (Calculated) : 66.1  Vital Signs: Temp: 97.9 F (36.6 C) (05/25 0622) Temp Source: Oral (05/25 0622) BP: 90/58 (05/25 0855) Pulse Rate: 86 (05/25 0855)  Labs: Recent Labs    07/07/21 0347 07/07/21 0349 07/07/21 1134 07/07/21 1338 07/08/21 0133 07/08/21 0925 07/08/21 1800 07/09/21 0340  HGB 9.4*  --   --   --  8.8*  --   --  9.2*  HCT 27.0*  --   --   --  26.3*  --   --  27.0*  PLT 79*  --   --   --  51*  --   --  85*  APTT 41*  --   --    < >  --  81* 91* 104*  LABPROT 24.7*  --   --   --   --   --   --   --   INR 2.3*  --   --   --   --   --   --   --   HEPARINUNFRC >1.10*  --   --   --  >1.10*  --   --  >1.10*  CREATININE 2.43*  --   --   --  3.20*  --   --  4.56*  CKTOTAL  --   --  531*  --   --   --   --  187  TROPONINIHS 8,353* 7,977* 10,105*  --   --   --   --   --    < > = values in this interval not displayed.     Estimated Creatinine Clearance: 13.1 mL/min (A) (by C-G formula based on SCr of 4.56 mg/dL (H)).   Assessment: 81 y.o. male admitted with chest pain, h/o Afib and Eliquis on hold (last dose 5/22 1700), on heparin.  Plans were for 48 hours of heparin with low platelet count but trend up   -Aptt 104 sec (on heparin 450 units/hr) this morning -Plt= 85   Goal of Therapy:  aPTT 66-102 sec Heparin level 0.3-0.7 units/ml Monitor platelets by anticoagulation protocol: Yes   Plan:  Restart heparin 450 units/hr Daily heparin level, aPTT and CBC  Thank you for allowing pharmacy to be a part of this patient's care.  Hildred Laser, PharmD Clinical Pharmacist **Pharmacist phone directory can now be found on [email or web address] ([email or web address]).  Listed under [email or web address].

## 2021-07-09 NOTE — Progress Notes (Signed)
[facility]  Date of Admission:  07/07/2021      Total days of antibiotics 4  Daptomycin 5/24 >> current          ASSESSMENT: Corey Gonzalez is a 81 y.o. male admitted with MRSA bacteremia likely from infected / bleeding chest port that was placed about a month ago.  Bacteremia - continue with daptomycin. Port has been removed today. Repeated blood cultures no growth at < 48h. Concern that he may have seeded left total knee. Recommend TEE to ensure no endocarditis given vascular port infection.   Left knee pain / swelling, S/P TKR - MRI reveals myositis but otherwise significant artifact obscuring study. Would recommend ortho to see him for consideration of arthrocentesis to determine if this is infected. Would send for cell count and culture if enough fluid can be aspirated.   Acute on Chronic Kidney Disease - worsening. Nephrology following and presume post contrast induce nephropathy and sepsis mediated. Renal u/s pending. Will continue daptomycin for tx and follow [facility].     PLAN: Will need TEE to rule out endocarditis Will need ortho consult for concern over left knee clinical exam in setting of MRSA bacteremia to determine if joint is infected  Follow repeat blood cultures  Hold off on central access for now.    Principal Problem:   Severe sepsis ([place]) Active Problems:   Alcohol dependence ([place])   Tobacco abuse   Gastro-esophageal reflux disease without esophagitis   NSTEMI (non-ST elevated myocardial infarction) (HCC)   Hypotension   Acute kidney injury superimposed on chronic kidney disease ([place])   Uncontrolled type 2 diabetes mellitus with hyperglycemia, with long-term current use of insulin (HCC)   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HCC)   Transaminitis   Hyperbilirubinemia   Macrocytic anemia   Thrombocytopenia (HCC)    vitamin C  500 mg Oral Daily   atorvastatin  40 mg Oral QHS   Chlorhexidine Gluconate Cloth  6 each Topical Q0600   docusate  sodium  100 mg Oral BID   DULoxetine  20 mg Oral Daily   ferrous sulfate  325 mg Oral Q breakfast   insulin aspart  0-6 Units Subcutaneous TID WC   insulin aspart  3 Units Subcutaneous TID WC   lidocaine       melatonin  3 mg Oral QHS   mupirocin ointment  1 application. Nasal BID   pantoprazole  40 mg Oral Daily   polyethylene glycol  17 g Oral Daily   sodium chloride flush  3 mL Intravenous Q12H   tamsulosin  0.4 mg Oral Daily    SUBJECTIVE: Had port removed today. Remembers our team and that he is in the hospital.  No new concerns over night.  Afebrile, WBC normalized.    Review of Systems: Review of Systems  Constitutional:  Negative for chills and fever.  Gastrointestinal:  Negative for abdominal pain (pain only overlying ostomy site), nausea and vomiting.  Genitourinary: Negative.   Musculoskeletal:  Positive for joint pain (lt knee).  Skin:  Negative for rash.  Neurological:  Negative for dizziness and headaches.    No Known Allergies   OBJECTIVE: Vitals:   07/09/21 0840 07/09/21 0845 07/09/21 0850 07/09/21 0855  BP: 90/60 104/60 (!) 86/61 (!) 90/58  Pulse: 80 81 86 86  Resp: (!) 30 18 18 15   Temp:      TempSrc:      SpO2: 97% 98% 99% 99%  Weight:      Height:       Body mass index is 27.66 kg/m.   Physical Exam Constitutional:      Appearance: Normal appearance. He is not ill-appearing.  HENT:     Mouth/Throat:     Mouth: Mucous membranes are moist.     Pharynx: Oropharynx is clear.  Cardiovascular:     Rate and Rhythm: Normal rate. Rhythm irregular.     Heart sounds: No murmur heard. Pulmonary:     Effort: Pulmonary effort is normal. No respiratory distress.     Breath sounds: Normal breath sounds.  Abdominal:     General: There is no distension.     Palpations: Abdomen is soft.  Musculoskeletal:        General: Swelling and tenderness present.  Skin:    General: Skin is warm and dry.     Capillary Refill: Capillary refill takes less than  2 seconds.     Comments: Rt chest port site clean and dry s/p removal   Neurological:     Mental Status: He is alert.     Comments: Seems a little disoriented today after his procedure.      Lab Results Lab Results  Component Value Date   WBC 9.1 07/09/2021   HGB 9.2 (L) 07/09/2021   HCT 27.0 (L) 07/09/2021   MCV 99.6 07/09/2021   PLT 85 (L) 07/09/2021    Lab Results  Component Value Date   CREATININE 4.56 (H) 07/09/2021   BUN 59 (H) 07/09/2021   NA 128 (L) 07/09/2021   K 4.1 07/09/2021   CL 98 07/09/2021   CO2 20 (L) 07/09/2021    Lab Results  Component Value Date   ALT 26 07/09/2021   AST 62 (H) 07/09/2021   ALKPHOS 79 07/09/2021   BILITOT 1.3 (H) 07/09/2021     Microbiology: Recent Results (from the past 240 hour(s))  Blood Culture (routine x 2)     Status: Abnormal (Preliminary result)   Collection Time: 07/07/21  4:00 AM   Specimen: BLOOD RIGHT HAND  Result Value Ref Range Status   Specimen Description BLOOD RIGHT HAND  Final   Special Requests   Final    BOTTLES DRAWN AEROBIC AND ANAEROBIC Blood Culture results may not be optimal due to an inadequate volume of blood received in culture bottles   Culture  Setup Time   Final    GRAM POSITIVE COCCI IN CLUSTERS IN BOTH AEROBIC AND ANAEROBIC BOTTLES CRITICAL RESULT CALLED TO, READ BACK BY AND VERIFIED WITH: PHARMD EDEN BREWINGTON ON 07/07/21 @ 1640 BY DRT    Culture (A)  Final    METHICILLIN RESISTANT STAPHYLOCOCCUS AUREUS Sent to [place] for further susceptibility testing. Performed at [facility], [place] [address] [place], [place] [address]    Report Status PENDING  Incomplete   Organism ID, Bacteria METHICILLIN RESISTANT STAPHYLOCOCCUS AUREUS  Final      Susceptibility   Methicillin resistant staphylococcus aureus - MIC*    CIPROFLOXACIN >=8 RESISTANT Resistant     ERYTHROMYCIN RESISTANT Resistant     GENTAMICIN <=0.5 SENSITIVE Sensitive     OXACILLIN >=4 RESISTANT Resistant     TETRACYCLINE <=1  SENSITIVE Sensitive     VANCOMYCIN <=0.5 SENSITIVE Sensitive     TRIMETH/SULFA <=10 SENSITIVE Sensitive     CLINDAMYCIN RESISTANT Resistant     RIFAMPIN <=0.5 SENSITIVE Sensitive     Inducible Clindamycin POSITIVE Resistant     * METHICILLIN RESISTANT STAPHYLOCOCCUS AUREUS  Blood Culture ID Panel (Reflexed)     Status: Abnormal   Collection Time: 07/07/21  4:00 AM  Result Value Ref Range Status   Enterococcus faecalis NOT DETECTED NOT DETECTED Final   Enterococcus Faecium NOT DETECTED NOT DETECTED Final   Listeria monocytogenes NOT DETECTED NOT DETECTED Final   Staphylococcus species DETECTED (A) NOT DETECTED Final    Comment: CRITICAL RESULT CALLED TO, READ BACK BY AND VERIFIED WITH: PHARMD EDEN BREWINGTON ON 07/07/21 @ 1640 BY DRT    Staphylococcus aureus (BCID) DETECTED (A) NOT DETECTED Final    Comment: Methicillin (oxacillin)-resistant Staphylococcus aureus (MRSA). MRSA is predictably resistant to beta-lactam antibiotics (except ceftaroline). Preferred therapy is vancomycin unless clinically contraindicated. Patient requires contact precautions if  hospitalized. CRITICAL RESULT CALLED TO, READ BACK BY AND VERIFIED WITH: PHARMD EDEN BREWINGTON ON 07/07/21 @ 1640 BY DRT    Staphylococcus epidermidis NOT DETECTED NOT DETECTED Final   Staphylococcus lugdunensis NOT DETECTED NOT DETECTED Final   Streptococcus species NOT DETECTED NOT DETECTED Final   Streptococcus agalactiae NOT DETECTED NOT DETECTED Final   Streptococcus pneumoniae NOT DETECTED NOT DETECTED Final   Streptococcus pyogenes NOT DETECTED NOT DETECTED Final   A.calcoaceticus-baumannii NOT DETECTED NOT DETECTED Final   Bacteroides fragilis NOT DETECTED NOT DETECTED Final   Enterobacterales NOT DETECTED NOT DETECTED Final   Enterobacter cloacae complex NOT DETECTED NOT DETECTED Final   Escherichia coli NOT DETECTED NOT DETECTED Final   Klebsiella aerogenes NOT DETECTED NOT DETECTED Final   Klebsiella oxytoca NOT DETECTED  NOT DETECTED Final   Klebsiella pneumoniae NOT DETECTED NOT DETECTED Final   Proteus species NOT DETECTED NOT DETECTED Final   Salmonella species NOT DETECTED NOT DETECTED Final   Serratia marcescens NOT DETECTED NOT DETECTED Final   Haemophilus influenzae NOT DETECTED NOT DETECTED Final   Neisseria meningitidis NOT DETECTED NOT DETECTED Final   Pseudomonas aeruginosa NOT DETECTED NOT DETECTED Final   Stenotrophomonas maltophilia NOT DETECTED NOT DETECTED Final   Candida albicans NOT DETECTED NOT DETECTED Final   Candida auris NOT DETECTED NOT DETECTED Final   Candida glabrata NOT DETECTED NOT DETECTED Final   Candida krusei NOT DETECTED NOT DETECTED Final   Candida parapsilosis NOT DETECTED NOT DETECTED Final   Candida tropicalis NOT DETECTED NOT DETECTED Final   Cryptococcus neoformans/gattii NOT DETECTED NOT DETECTED Final   Meth resistant mecA/C and MREJ DETECTED (A) NOT DETECTED Final    Comment: CRITICAL RESULT CALLED TO, READ BACK BY AND VERIFIED WITH: PHARMD EDEN BREWINGTON ON 07/07/21 @ 1640 BY DRT Performed at [facility], [address] [place], [place] [address]   Resp Panel by RT-PCR (Flu A&B, Covid) Nasopharyngeal Swab     Status: None   Collection Time: 07/07/21  4:06 AM   Specimen: Nasopharyngeal Swab; Nasopharyngeal(NP) swabs in vial transport medium  Result Value Ref Range Status   SARS Coronavirus 2 by RT PCR NEGATIVE NEGATIVE Final    Comment: (NOTE) SARS-CoV-2 target nucleic acids are NOT DETECTED.  The SARS-CoV-2 RNA is generally detectable in upper respiratory specimens during the acute phase of infection. The lowest concentration of SARS-CoV-2 viral copies this assay can detect is 138 copies/mL. A negative result does not preclude SARS-Cov-2 infection and should not be used as the sole basis for treatment or other patient management decisions. A negative result may occur with  improper specimen collection/handling, submission of specimen other than  nasopharyngeal swab, presence of viral mutation(s) within the areas targeted by this assay, and inadequate number of  viral copies(<138 copies/mL). A negative result must be combined with clinical observations, patient history, and epidemiological information. The expected result is Negative.  Fact Sheet for Patients:  EntrepreneurPulse.com.au  Fact Sheet for Healthcare Providers:  IncredibleEmployment.be  This test is no t yet approved or cleared by the [place] FDA and  has been authorized for detection and/or diagnosis of SARS-CoV-2 by FDA under an Emergency Use Authorization (EUA). This EUA will remain  in effect (meaning this test can be used) for the duration of the COVID-19 declaration under Section 564(b)(1) of the Act, 21 U.S.C.section 360bbb-3(b)(1), unless the authorization is terminated  or revoked sooner.       Influenza A by PCR NEGATIVE NEGATIVE Final   Influenza B by PCR NEGATIVE NEGATIVE Final    Comment: (NOTE) The Xpert Xpress SARS-CoV-2/FLU/RSV plus assay is intended as an aid in the diagnosis of influenza from Nasopharyngeal swab specimens and should not be used as a sole basis for treatment. Nasal washings and aspirates are unacceptable for Xpert Xpress SARS-CoV-2/FLU/RSV testing.  Fact Sheet for Patients: EntrepreneurPulse.com.au  Fact Sheet for Healthcare Providers: IncredibleEmployment.be  This test is not yet approved or cleared by the [place] FDA and has been authorized for detection and/or diagnosis of SARS-CoV-2 by FDA under an Emergency Use Authorization (EUA). This EUA will remain in effect (meaning this test can be used) for the duration of the COVID-19 declaration under Section 564(b)(1) of the Act, 21 U.S.C. section 360bbb-3(b)(1), unless the authorization is terminated or revoked.  Performed at [facility], [place] [address], [place], [place] [address]    Blood Culture (routine x 2)     Status: Abnormal (Preliminary result)   Collection Time: 07/07/21  4:15 AM   Specimen: BLOOD  Result Value Ref Range Status   Specimen Description BLOOD LEFT ANTECUBITAL  Final   Special Requests   Final    BOTTLES DRAWN AEROBIC AND ANAEROBIC Blood Culture adequate volume   Culture  Setup Time   Final    GRAM POSITIVE COCCI IN CLUSTERS IN BOTH AEROBIC AND ANAEROBIC BOTTLES CRITICAL VALUE NOTED.  VALUE IS CONSISTENT WITH PREVIOUSLY REPORTED AND CALLED VALUE. CRITICAL RESULT CALLED TO, READ BACK BY AND VERIFIED WITH: PHARMD ERicki Miller [id number] @1729  FH      Culture (A)  Final    STAPHYLOCOCCUS AUREUS SUSCEPTIBILITIES PERFORMED ON PREVIOUS CULTURE WITHIN THE LAST 5 DAYS. Performed at [facility], [place] [address], [place], [place] [address]    Report Status PENDING  Incomplete  Urine Culture     Status: None   Collection Time: 07/07/21  7:42 AM   Specimen: In/Out Cath Urine  Result Value Ref Range Status   Specimen Description IN/OUT CATH URINE  Final   Special Requests NONE  Final   Culture   Final    NO GROWTH Performed at [facility], [place] [address] [place], [place] [address]    Report Status 07/08/2021 FINAL  Final  Culture, blood (Routine X 2) w Reflex to ID Panel     Status: None (Preliminary result)   Collection Time: 07/08/21  9:25 AM   Specimen: BLOOD LEFT HAND  Result Value Ref Range Status   Specimen Description BLOOD LEFT HAND  Final   Special Requests   Final    BOTTLES DRAWN AEROBIC AND ANAEROBIC Blood Culture adequate volume   Culture   Final    NO GROWTH 1 DAY Performed at [facility], [place] [address]  [place], [place] [address]    Report Status PENDING  Incomplete  Culture, blood (Routine X 2) w Reflex to ID Panel     Status: None (Preliminary result)   Collection Time: 07/08/21  9:25 AM   Specimen: BLOOD LEFT HAND  Result Value Ref Range Status   Specimen Description BLOOD LEFT HAND  Final   Special  Requests   Final    BOTTLES DRAWN AEROBIC AND ANAEROBIC Blood Culture adequate volume   Culture   Final    NO GROWTH 1 DAY Performed at [facility], [place] [address] [place], [place] [address]    Report Status PENDING  Incomplete  MRSA Next Gen by PCR, Nasal     Status: Abnormal   Collection Time: 07/08/21  3:07 PM   Specimen: Nasal Mucosa; Nasal Swab  Result Value Ref Range Status   MRSA by PCR Next Gen DETECTED (A) NOT DETECTED Final    Comment: RESULT CALLED TO, READ BACK BY AND VERIFIED WITH: L MITCHELL,RN@2132  07/08/21 [place] (NOTE) The GeneXpert MRSA Assay (FDA approved for NASAL specimens only), is one component of a comprehensive MRSA colonization surveillance program. It is not intended to diagnose MRSA infection nor to guide or monitor treatment for MRSA infections. Test performance is not FDA approved in patients less than 58 years old. Performed at [facility], [place] [address] [place], [place] [address]      Janene Madeira, MSN, NP-C [facility] [email or web address] Pager: [phone] Office: [phone] [facility] Main Line: [place] Communication Welcome

## 2021-07-09 NOTE — Consult Note (Signed)
Reason for Consult: Renal failure Referring Physician:  Dr. 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 U/A negative for proteinuria with a FeNA is 0.28%. which can be seen with contrast nephropathy or CHF exacerbation. His case is complicated by sepsis as well from the infected port. There are no absolute indications for dialysis at this time but certainly possible in the next 24-48 hrs. Renal failure likely a combination of contrast induced nephropathy + sepsis. Port was removed 5/25. - No absolute indication for RRT and the patient appears to be  comfortable. - Will check a renal ultrasound to rule out obstruction, check for cortical thickness and size of kidneys. - Will also check a bladder post void scan. - BP very soft and not convinced this is a CHF exacerbation and will not challenge with Lasix for now.  -Monitor Daily I/Os, Daily weight  -Maintain MAP>65 for optimal renal perfusion.  -Avoid nephrotoxic medications including NSAIDs  Hypotension which responded somewhat to IVF resuscitation. Hold antihypertensives.  DM - on ISS per primary.  NSTEMI - uptrend in troponin but no cath because of infection and AKI. Anemia - no obvious bleed; transfuse as needed. 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 Recently received the 4th cycle of capecitabine and bevacizumab at the [place] on 5/18. Patient then developed chills, myagias, malaise, nausea and vomitingl He also had shortness of breath with a chronic cough. He denied hemoptysis, dysuria, obstructive like symptoms, rashes. He also denied any NSAID use. Of note he did have a CT AP with contrast at the [place] on 5/22. Patient was noted to have fevers with temps as high as 102, tachycardic and  tachypneic. BUN/Cr was 36/2.43 w/ an elevated troponin. Patient was started on antibiotics with vancomycin, metronidazole and Cefepime. + fluid resuscitation.    BP's has run from 90-100's/50-60's during this hospitalization. Initial BUN/Cr was 36/2.43 on evening of 5/22. Patient is +3306 mL during this hospitalization.  ROS Pertinent items are noted in HPI.  Chemistry and CBC: Creatinine, Ser  Date/Time Value Ref Range Status  07/09/2021 03:40 AM 4.56 (H) 0.61 - 1.24 mg/dL Final  07/08/2021 01:33 AM 3.20 (H) 0.61 - 1.24 mg/dL Final  07/07/2021 03:47 AM 2.43 (H) 0.61 - 1.24 mg/dL Final  03/27/2021 01:33 PM 1.38 (H) 0.61 - 1.24 mg/dL Final  01/04/2021 05:30 AM 0.93 0.61 - 1.24 mg/dL Final  01/02/2021 05:57 AM 1.21 0.61 - 1.24 mg/dL Final  01/01/2021 04:59 AM 1.40 (H) 0.61 - 1.24 mg/dL Final  12/30/2020 02:43 PM 1.15 0.61 - 1.24 mg/dL Final  09/18/2019 04:51 AM 1.17 0.61 - 1.24 mg/dL Final  09/17/2019 04:23 AM 1.41 (H) 0.61 - 1.24 mg/dL Final  09/16/2019 05:31 AM 1.31 (H) 0.61 - 1.24 mg/dL Final  09/15/2019 05:52 PM 1.26 (H) 0.61 - 1.24 mg/dL Final  09/15/2019 04:24 AM 1.22 0.61 - 1.24 mg/dL Final  09/14/2019 08:02 PM 1.41 (H) 0.61 - 1.24 mg/dL Final   Recent Labs  Lab 07/07/21 0347 07/08/21 0133 07/09/21 0340  NA 129* 130* 128*  K 4.0 4.6 4.1  CL 99 102 98  CO2 20* 18* 20*  GLUCOSE 215* 117* 125*  BUN 36* 45* 59*  CREATININE 2.43* 3.20* 4.56*  CALCIUM 8.0* 8.0* 8.2*  PHOS  --   --  4.0   Recent Labs  Lab 07/07/21 0347 07/08/21 0133 07/09/21 0340  WBC 10.9* 10.2 9.1  NEUTROABS 9.4*  --   --   HGB 9.4* 8.8* 9.2*  HCT 27.0* 26.3* 27.0*  MCV 98.2 99.6 99.6  PLT 79* 51* 85*   Liver Function Tests: Recent Labs  Lab 07/07/21 0347 07/08/21 0133 07/09/21 0340  AST 92* 85* 62*  ALT 33 27 26  ALKPHOS 80 76 79  BILITOT 2.0* 1.7* 1.3*  PROT 6.1* 5.6* 5.9*  ALBUMIN 2.6* 2.2* 2.4*   No results for input(s): LIPASE, AMYLASE in the last 168 hours. No results for  input(s): AMMONIA in the last 168 hours. Cardiac Enzymes: Recent Labs  Lab 07/07/21 1134 07/09/21 0340  CKTOTAL 531* 187   Iron Studies: No results for input(s): IRON, TIBC, TRANSFERRIN, FERRITIN in the last 72 hours. PT/INR: @LABRCNTIP (inr:5)  Xrays/Other Studies: ) Results for orders placed or performed during the hospital encounter of 07/07/21 (from the past 48 hour(s))  Lactic acid, plasma     Status: Abnormal   Collection Time: 07/07/21 11:34 AM  Result Value Ref Range   Lactic Acid, Venous 2.8 (HH) 0.5 - 1.9 mmol/L    Comment: CRITICAL VALUE NOTED.  VALUE IS CONSISTENT WITH PREVIOUSLY REPORTED AND CALLED VALUE. Performed at [facility], [place] [address], [place], [place] [address]   Troponin I (High Sensitivity)     Status: Abnormal   Collection Time: 07/07/21 11:34 AM  Result Value Ref Range   Troponin I (High Sensitivity) 10,105 (HH) <18 ng/L    Comment: CRITICAL VALUE NOTED.  VALUE IS CONSISTENT WITH PREVIOUSLY REPORTED AND CALLED VALUE. (NOTE) Elevated high sensitivity troponin I (hsTnI) values and significant  changes across serial measurements may suggest ACS but many other  chronic and acute conditions are known to elevate hsTnI results.  Refer to the Links section for chest pain algorithms and additional  guidance. Performed at [facility], [place] [address], [place], [place] [address]   CK     Status: Abnormal   Collection Time: 07/07/21 11:34 AM  Result Value Ref Range   Total CK 531 (H) 49 - 397 U/L    Comment: Performed at [facility], [place] [address] [place], [place] [address]  CBG monitoring, ED     Status: Abnormal   Collection Time: 07/07/21 12:40 PM  Result Value Ref Range   Glucose-Capillary 226 (H) 70 - 99 mg/dL    Comment: Glucose reference range applies only to samples taken after fasting for at least 8 hours.  APTT     Status: Abnormal   Collection Time: 07/07/21  1:38 PM  Result Value Ref Range   aPTT 103 (H) 24 - 36  seconds    Comment:        IF BASELINE aPTT IS ELEVATED, SUGGEST PATIENT RISK ASSESSMENT BE USED TO DETERMINE APPROPRIATE ANTICOAGULANT THERAPY. Performed at [facility], [place] [address] [place], [place] [address]   Lactic acid, plasma     Status: Abnormal   Collection Time: 07/07/21  4:43 PM  Result Value Ref Range   Lactic Acid, Venous 3.3 (HH) 0.5 - 1.9 mmol/L    Comment: CRITICAL VALUE NOTED.  VALUE IS CONSISTENT WITH PREVIOUSLY REPORTED AND CALLED VALUE. Performed at [facility], [place] [address], [place], [place] [address]   Glucose, capillary     Status: Abnormal   Collection Time: 07/07/21  5:35 PM  Result Value Ref Range  Glucose-Capillary 179 (H) 70 - 99 mg/dL    Comment: Glucose reference range applies only to samples taken after fasting for at least 8 hours.  Lactic acid, plasma     Status: None   Collection Time: 07/07/21  7:03 PM  Result Value Ref Range   Lactic Acid, Venous 1.7 0.5 - 1.9 mmol/L    Comment: Performed at [facility], [place] [address], [place], [place] [address]  Glucose, capillary     Status: Abnormal   Collection Time: 07/07/21  9:16 PM  Result Value Ref Range   Glucose-Capillary 138 (H) 70 - 99 mg/dL    Comment: Glucose reference range applies only to samples taken after fasting for at least 8 hours.  APTT     Status: Abnormal   Collection Time: 07/07/21 10:30 PM  Result Value Ref Range   aPTT 146 (H) 24 - 36 seconds    Comment:        IF BASELINE aPTT IS ELEVATED, SUGGEST PATIENT RISK ASSESSMENT BE USED TO DETERMINE APPROPRIATE ANTICOAGULANT THERAPY. Performed at [facility], [place] [address] [place], [place] [address]   Heparin level (unfractionated)     Status: Abnormal   Collection Time: 07/08/21  1:33 AM  Result Value Ref Range   Heparin Unfractionated >1.10 (H) 0.30 - 0.70 IU/mL    Comment: (NOTE) The clinical reportable range upper limit is being lowered to >1.10 to align with the FDA approved guidance for  the current laboratory assay.  If heparin results are below expected values, and patient dosage has  been confirmed, suggest follow up testing of antithrombin III levels. Performed at [facility], [place] [address], [place], [place] [address]   CBC     Status: Abnormal   Collection Time: 07/08/21  1:33 AM  Result Value Ref Range   WBC 10.2 4.0 - 10.5 K/uL   RBC 2.64 (L) 4.22 - 5.81 MIL/uL   Hemoglobin 8.8 (L) 13.0 - 17.0 g/dL   HCT 26.3 (L) 39.0 - 52.0 %   MCV 99.6 80.0 - 100.0 fL   MCH 33.3 26.0 - 34.0 pg   MCHC 33.5 30.0 - 36.0 g/dL   RDW 20.7 (H) 11.5 - 15.5 %   Platelets 51 (L) 150 - 400 K/uL    Comment: Immature Platelet Fraction may be clinically indicated, consider ordering this additional test [id number] REPEATED TO VERIFY    nRBC 0.0 0.0 - 0.2 %    Comment: Performed at [facility], [place] [address], [place], [place] [address]  Comprehensive metabolic panel     Status: Abnormal   Collection Time: 07/08/21  1:33 AM  Result Value Ref Range   Sodium 130 (L) 135 - 145 mmol/L   Potassium 4.6 3.5 - 5.1 mmol/L   Chloride 102 98 - 111 mmol/L   CO2 18 (L) 22 - 32 mmol/L   Glucose, Bld 117 (H) 70 - 99 mg/dL    Comment: Glucose reference range applies only to samples taken after fasting for at least 8 hours.   BUN 45 (H) 8 - 23 mg/dL   Creatinine, Ser 3.20 (H) 0.61 - 1.24 mg/dL   Calcium 8.0 (L) 8.9 - 10.3 mg/dL   Total Protein 5.6 (L) 6.5 - 8.1 g/dL   Albumin 2.2 (L) 3.5 - 5.0 g/dL   AST 85 (H) 15 - 41 U/L   ALT 27 0 - 44 U/L   Alkaline Phosphatase 76 38 - 126 U/L  Total Bilirubin 1.7 (H) 0.3 - 1.2 mg/dL   GFR, Estimated 19 (L) >60 mL/min    Comment: (NOTE) Calculated using the CKD-EPI Creatinine Equation (2021)    Anion gap 10 5 - 15    Comment: Performed at [facility] [address] [place], [place] [address]  Glucose, capillary     Status: Abnormal   Collection Time: 07/08/21  8:02 AM  Result Value Ref Range   Glucose-Capillary 124 (H) 70 -  99 mg/dL    Comment: Glucose reference range applies only to samples taken after fasting for at least 8 hours.  APTT     Status: Abnormal   Collection Time: 07/08/21  9:25 AM  Result Value Ref Range   aPTT 81 (H) 24 - 36 seconds    Comment:        IF BASELINE aPTT IS ELEVATED, SUGGEST PATIENT RISK ASSESSMENT BE USED TO DETERMINE APPROPRIATE ANTICOAGULANT THERAPY. Performed at [facility], [place] [address] [place], [place] [address]   Culture, blood (Routine X 2) w Reflex to ID Panel     Status: None (Preliminary result)   Collection Time: 07/08/21  9:25 AM   Specimen: BLOOD LEFT HAND  Result Value Ref Range   Specimen Description BLOOD LEFT HAND    Special Requests      BOTTLES DRAWN AEROBIC AND ANAEROBIC Blood Culture adequate volume   Culture      NO GROWTH < 12 HOURS Performed at [facility], [place] [address] [place], [place] [address]    Report Status PENDING   Culture, blood (Routine X 2) w Reflex to ID Panel     Status: None (Preliminary result)   Collection Time: 07/08/21  9:25 AM   Specimen: BLOOD LEFT HAND  Result Value Ref Range   Specimen Description BLOOD LEFT HAND    Special Requests      BOTTLES DRAWN AEROBIC AND ANAEROBIC Blood Culture adequate volume   Culture      NO GROWTH < 12 HOURS Performed at [facility], [place] [address], [place], [place] [address]    Report Status PENDING   Glucose, capillary     Status: Abnormal   Collection Time: 07/08/21 11:31 AM  Result Value Ref Range   Glucose-Capillary 144 (H) 70 - 99 mg/dL    Comment: Glucose reference range applies only to samples taken after fasting for at least 8 hours.  MRSA Next Gen by PCR, Nasal     Status: Abnormal   Collection Time: 07/08/21  3:07 PM   Specimen: Nasal Mucosa; Nasal Swab  Result Value Ref Range   MRSA by PCR Next Gen DETECTED (A) NOT DETECTED    Comment: RESULT CALLED TO, READ BACK BY AND VERIFIED WITH: L MITCHELL,RN@[id number]  07/08/21 [place] (NOTE) The GeneXpert MRSA  Assay (FDA approved for NASAL specimens only), is one component of a comprehensive MRSA colonization surveillance program. It is not intended to diagnose MRSA infection nor to guide or monitor treatment for MRSA infections. Test performance is not FDA approved in patients less than 27 years old. Performed at [facility], [place] [address] [place], [place] [address]   Glucose, capillary     Status: Abnormal   Collection Time: 07/08/21  4:01 PM  Result Value Ref Range   Glucose-Capillary 189 (H) 70 - 99 mg/dL    Comment: Glucose reference range applies only to samples taken after fasting for at least 8 hours.  APTT  Status: Abnormal   Collection Time: 07/08/21  6:00 PM  Result Value Ref Range   aPTT 91 (H) 24 - 36 seconds    Comment:        IF BASELINE aPTT IS ELEVATED, SUGGEST PATIENT RISK ASSESSMENT BE USED TO DETERMINE APPROPRIATE ANTICOAGULANT THERAPY. Performed at [facility], [place] [address] [place], [place] [address]   Glucose, capillary     Status: Abnormal   Collection Time: 07/08/21  9:36 PM  Result Value Ref Range   Glucose-Capillary 127 (H) 70 - 99 mg/dL    Comment: Glucose reference range applies only to samples taken after fasting for at least 8 hours.  Heparin level (unfractionated)     Status: Abnormal   Collection Time: 07/09/21  3:40 AM  Result Value Ref Range   Heparin Unfractionated >1.10 (H) 0.30 - 0.70 IU/mL    Comment: (NOTE) The clinical reportable range upper limit is being lowered to >1.10 to align with the FDA approved guidance for the current laboratory assay.  If heparin results are below expected values, and patient dosage has  been confirmed, suggest follow up testing of antithrombin III levels. Performed at [facility], [place] [address] [place], [place] [address]   CBC     Status: Abnormal   Collection Time: 07/09/21  3:40 AM  Result Value Ref Range   WBC 9.1 4.0 - 10.5 K/uL   RBC 2.71 (L) 4.22 - 5.81 MIL/uL    Hemoglobin 9.2 (L) 13.0 - 17.0 g/dL   HCT 27.0 (L) 39.0 - 52.0 %   MCV 99.6 80.0 - 100.0 fL   MCH 33.9 26.0 - 34.0 pg   MCHC 34.1 30.0 - 36.0 g/dL   RDW 21.2 (H) 11.5 - 15.5 %   Platelets 85 (L) 150 - 400 K/uL    Comment: Immature Platelet Fraction may be clinically indicated, consider ordering this additional test [id number] CONSISTENT WITH PREVIOUS RESULT REPEATED TO VERIFY    nRBC 0.0 0.0 - 0.2 %    Comment: Performed at [facility], [place] [address] [place], [place] [address]  APTT     Status: Abnormal   Collection Time: 07/09/21  3:40 AM  Result Value Ref Range   aPTT 104 (H) 24 - 36 seconds    Comment:        IF BASELINE aPTT IS ELEVATED, SUGGEST PATIENT RISK ASSESSMENT BE USED TO DETERMINE APPROPRIATE ANTICOAGULANT THERAPY. Performed at [facility], [place] [address], [place], [place] [address]   CK     Status: None   Collection Time: 07/09/21  3:40 AM  Result Value Ref Range   Total CK 187 49 - 397 U/L    Comment: Performed at [facility], [place] [address], [place], [place] [address]  Comprehensive metabolic panel     Status: Abnormal   Collection Time: 07/09/21  3:40 AM  Result Value Ref Range   Sodium 128 (L) 135 - 145 mmol/L   Potassium 4.1 3.5 - 5.1 mmol/L   Chloride 98 98 - 111 mmol/L   CO2 20 (L) 22 - 32 mmol/L   Glucose, Bld 125 (H) 70 - 99 mg/dL    Comment: Glucose reference range applies only to samples taken after fasting for at least 8 hours.   BUN 59 (H) 8 - 23 mg/dL   Creatinine, Ser 4.56 (H) 0.61 - 1.24 mg/dL   Calcium 8.2 (L) 8.9 - 10.3 mg/dL   Total Protein 5.9 (L)  6.5 - 8.1 g/dL   Albumin 2.4 (L) 3.5 - 5.0 g/dL   AST 62 (H) 15 - 41 U/L   ALT 26 0 - 44 U/L   Alkaline Phosphatase 79 38 - 126 U/L   Total Bilirubin 1.3 (H) 0.3 - 1.2 mg/dL   GFR, Estimated 12 (L) >60 mL/min    Comment: (NOTE) Calculated using the CKD-EPI Creatinine Equation (2021)    Anion gap 10 5 - 15    Comment: Performed at [facility]  [address], [place], [place] [address]  Magnesium     Status: None   Collection Time: 07/09/21  3:40 AM  Result Value Ref Range   Magnesium 1.7 1.7 - 2.4 mg/dL    Comment: Performed at [facility] [address] [place], [place] [address]  Phosphorus     Status: None   Collection Time: 07/09/21  3:40 AM  Result Value Ref Range   Phosphorus 4.0 2.5 - 4.6 mg/dL    Comment: Performed at [facility] [address], [place], [place] [address]  Glucose, capillary     Status: Abnormal   Collection Time: 07/09/21  8:00 AM  Result Value Ref Range   Glucose-Capillary 140 (H) 70 - 99 mg/dL    Comment: Glucose reference range applies only to samples taken after fasting for at least 8 hours.   MR FEMUR LEFT WO CONTRAST  Result Date: 07/09/2021 CLINICAL DATA:  History of left hip ORIF. Evaluate soft tissue infection. EXAM: MR OF THE LEFT FEMUR WITHOUT CONTRAST TECHNIQUE: Multiplanar, multisequence MR imaging of the left femur was performed. No intravenous contrast was administered. COMPARISON:  None Available. FINDINGS: Bones/Joint/Cartilage No acute fracture or dislocation. Prior ORIF of a healed intertrochanteric fracture with susceptibility artifact partially obscuring the adjacent soft tissue and osseous structures. Prior ORIF of a left femoral diaphysis fracture transfixed with a lateral sideplate and multiple interlocking screws with susceptibility artifact partially obscuring the adjacent soft tissue and osseous structures. No aggressive osseous lesion. Normal alignment. No joint effusion. No marrow signal abnormality. Partial-thickness cartilage loss of the left femoral head and acetabulum. Right total hip arthroplasty partially visualized. No periarticular fluid collection or osteolysis. Muscles and Tendons Mild edema in the tensor fascia lata and vastus intermedius muscle adjacent to the hip concerning for mild myositis with surrounding soft tissue edema between the muscle planes. Mild muscle  edema in the distal vastus lateralis muscle concerning for myositis versus muscle strain. No intramuscular fluid collection or hematoma. Soft tissue Generalized anasarca. No soft tissue mass. No fluid collection or hematoma. IMPRESSION: 1. No evidence of septic arthritis of the left hip. Mild-moderate osteoarthritis of the left hip. 2. Mild edema in the tensor fascia lata and vastus intermedius muscle adjacent to the hip concerning for mild myositis with surrounding soft tissue edema between the muscle planes. Mild muscle edema in the distal vastus lateralis muscle concerning for myositis versus muscle strain. No fluid collection to suggest an abscess. 3. Evaluation for infection related to the hardware is extremely limited given the degree of artifact resulting from the metallic hardware. Electronically Signed   By: Kathreen Devoid M.D.   On: 07/09/2021 08:34   MR KNEE LEFT WO CONTRAST  Result Date: 07/09/2021 CLINICAL DATA:  History of knee replacement. Evaluate for infection. EXAM: MRI OF THE LEFT KNEE WITHOUT CONTRAST TECHNIQUE: Multiplanar, multisequence MR imaging of the knee was performed. No intravenous contrast was administered. COMPARISON:  None Available. FINDINGS: Bones/Joint/Cartilage No acute  fracture or dislocation. 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 No periarticular fluid collection, hematoma or osteolysis. No evidence of hardware failure or complication within limitations of the artifact. Normal alignment. No joint effusion. No marrow signal abnormality. Muscles and Tendons Soft tissue edema involving the distal aspect of the vastus lateralis muscle concerning for muscle strain versus myositis. No intramuscular fluid collection or hematoma. Quadriceps tendon and patellar tendon are intact. Soft tissue No fluid collection or hematoma. No soft tissue mass. Generalized anasarca. IMPRESSION: 1. Left  total knee arthroplasty with susceptibility artifact severely limiting evaluation of the adjacent soft tissue and osseous structures. No periarticular fluid collection, hematoma or osteolysis. No evidence of hardware failure or complication. 2. Soft tissue edema involving the distal aspect of the vastus lateralis muscle concerning for muscle strain versus myositis. Electronically Signed   By: Kathreen Devoid M.D.   On: 07/09/2021 08:37   ECHOCARDIOGRAM COMPLETE  Result Date: 07/07/2021    ECHOCARDIOGRAM REPORT   Patient Name:   Corey Gonzalez Date of Exam: 07/07/2021 Medical Rec #:  [id number]    Height:       67.0 in Accession #:    [id number]   Weight:       106.5 lb Date of Birth:  04/26/1940     BSA:          1.547 m Patient Age:    49 years     BP:           99/68 mmHg Patient Gender: M            HR:           93 bpm. Exam Location:  Inpatient Procedure: 2D Echo, Cardiac Doppler and Color Doppler Indications:    Elevated troponin  History:        Patient has no prior history of Echocardiogram examinations.  Sonographer:    [place] Referring Phys: [id number] [place]  1. Left ventricular ejection fraction, by estimation, is 45-50%. The left ventricle is mildly decreased. The left ventricle demonstrates regional wall motion abnormalities (mid-apical septal hypokinesis). 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 Left ventricular diastolic parameters are consistent with Grade I diastolic dysfunction (impaired relaxation).  2. Right ventricular systolic function is normal. The right ventricular size is mildly enlarged.  3. The mitral valve is grossly normal. Mild to moderate mitral valve regurgitation. Moderate mitral annular calcification. Pulmonary vein blunting, there are two jets.  4. Tricuspid valve regurgitation is mild-moderate, eccentric, and functional.  5. The aortic valve is tricuspid. Aortic valve regurgitation is not visualized. No  aortic stenosis is present. Comparison(s): No prior Echocardiogram. FINDINGS  Left Ventricle: Left ventricular ejection fraction, by estimation, is 45 to 50%. The left ventricle has mildly decreased function. The left ventricle demonstrates regional wall motion abnormalities. The left ventricular internal cavity size was normal in size. 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 Left ventricular diastolic parameters are consistent with Grade I diastolic dysfunction (impaired relaxation).  LV Wall Scoring: The mid and distal anterior septum and mid inferoseptal segment are hypokinetic. Right Ventricle: The right ventricular size is mildly enlarged. No increase in right ventricular wall thickness. Right ventricular systolic function is normal. Left Atrium: Left atrial size was normal in size. Right Atrium: Right atrial size was normal in size. Pericardium: There is no evidence of pericardial effusion. Mitral Valve: The  mitral valve is grossly normal. Moderate mitral annular calcification. Mild to moderate mitral valve regurgitation. Tricuspid Valve: Eccentric. The tricuspid valve is normal in structure. Tricuspid valve regurgitation is mild to moderate. Aortic Valve: The aortic valve is tricuspid. Aortic valve regurgitation is not visualized. No aortic stenosis is present. Aortic valve mean gradient measures 4.0 mmHg. Aortic valve peak gradient measures 8.0 mmHg. Aortic valve area, by VTI measures 2.02 cm. Pulmonic Valve: The pulmonic valve was not well visualized. Pulmonic valve regurgitation is not visualized. No evidence of pulmonic stenosis. Aorta: The aortic root and ascending aorta are structurally normal, with no evidence of dilitation. IAS/Shunts: No atrial level shunt detected by color flow Doppler.  LEFT VENTRICLE PLAX 2D LVIDd:         4.30 cm     Diastology LVIDs:         3.30 cm     LV e' medial:    6.09 cm/s LV PW:         1.30 cm     LV E/e' medial:  15.3 LV  IVS:        1.60 cm     LV e' lateral:   8.16 cm/s LVOT diam:     2.20 cm     LV E/e' lateral: 11.4 LV SV:         49 LV SV Index:   32 LVOT Area:     3.80 cm  LV Volumes (MOD) LV vol d, MOD A2C: 92.7 ml LV vol d, MOD A4C: 92.5 ml LV vol s, MOD A2C: 45.6 ml LV vol s, MOD A4C: 33.0 ml LV SV MOD A2C:     47.1 ml LV SV MOD A4C:     92.5 ml LV SV MOD BP:      53.9 ml RIGHT VENTRICLE RV Basal diam:  4.40 cm RV Mid diam:    4.00 cm RV S prime:     7.51 cm/s TAPSE (M-mode): 1.7 cm LEFT ATRIUM             Index        RIGHT ATRIUM           Index LA diam:        2.30 cm 1.49 cm/m   RA Area:     17.60 cm LA Vol (A2C):   32.6 ml 21.07 ml/m  RA Volume:   39.50 ml  25.53 ml/m LA Vol (A4C):   40.0 ml 25.86 ml/m LA Biplane Vol: 38.2 ml 24.69 ml/m  AORTIC VALVE                    PULMONIC VALVE AV Area (Vmax):    1.97 cm     PV Vmax:       0.86 m/s AV Area (Vmean):   1.90 cm     PV Peak grad:  3.0 mmHg AV Area (VTI):     2.02 cm AV Vmax:           141.00 cm/s AV Vmean:          95.800 cm/s AV VTI:            0.243 m AV Peak Grad:      8.0 mmHg AV Mean Grad:      4.0 mmHg LVOT Vmax:         73.20 cm/s LVOT Vmean:        47.900 cm/s LVOT VTI:          0.129  m LVOT/AV VTI ratio: 0.53  AORTA Ao Root diam: 3.20 cm Ao Asc diam:  3.40 cm MITRAL VALVE                TRICUSPID VALVE MV Area (PHT): 7.74 cm     TR Peak grad:   30.9 mmHg MV Decel Time: 98 msec      TR Vmax:        278.00 cm/s MR Peak grad: 78.5 mmHg MR Mean grad: 50.0 mmHg     SHUNTS MR Vmax:      443.00 cm/s   Systemic VTI:  0.13 m MR Vmean:     328.0 cm/s    Systemic Diam: 2.20 cm MV E velocity: 93.10 cm/s MV A velocity: 108.00 cm/s MV E/A ratio:  0.86 Rudean Haskell MD Electronically signed by Rudean Haskell MD Signature Date/Time: 07/07/2021/12:18:58 PM    Final     PMH:   Past Medical History:  Diagnosis Date   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[place])    Diabetes mellitus without complication ([place])    Hypertension    Lung cancer ([place])     PSH:   Past Surgical  History:  Procedure Laterality Date   COLOSTOMY     INTRAMEDULLARY (IM) NAIL INTERTROCHANTERIC Left 09/15/2019   Procedure: INTRAMEDULLARY (IM) NAIL INTERTROCHANTRIC;  Surgeon: Leim Fabry, MD;  Location: [facility] OR[facility];  Service: Orthopedics;  Laterality: Left;   JOINT REPLACEMENT     bilateral knees   JOINT REPLACEMENT Right    hip   ORIF FEMUR FRACTURE Left 12/31/2020   Procedure: OPEN REDUCTION INTERNAL FIXATION (ORIF) DISTAL FEMUR FRACTURE - PERIPROSTHETIC FRACTURE;  Surgeon: Hessie Knows, MD;  Location: [facility];  Service: Orthopedics;  Laterality: Left;    Allergies: No Known Allergies  Medications:   Prior to Admission medications   Medication Sig Start Date End Date Taking? Authorizing Provider  acetaminophen (TYLENOL) 325 MG tablet Take 650 mg by mouth every 6 (six) hours as needed for mild pain. 03/20/18  Yes [provider]  ADMELOG 100 UNIT/ML injection Inject 3 Units into the skin in the morning, at noon, and at bedtime. 06/16/21  Yes [provider]  apixaban (ELIQUIS) 5 MG TABS tablet Take 5 mg by mouth 2 (two) times daily.   Yes [provider]  atorvastatin (LIPITOR) 20 MG tablet Take 20 mg by mouth at bedtime. 09/17/20  Yes [provider]  bisacodyl (DULCOLAX) 10 MG suppository Place 1 suppository (10 mg total) rectally daily as needed for moderate constipation. 09/18/19  Yes Georgette Shell, MD  capecitabine (XELODA) 500 MG tablet Take 1,500 mg by mouth at bedtime.   Yes [provider]  cholecalciferol (VITAMIN D3) 25 MCG (1000 UNIT) tablet Take 1,000 Units by mouth daily.   Yes [provider]  docusate sodium (COLACE) 100 MG capsule Take 1 capsule (100 mg total) by mouth 2 (two) times daily. 09/18/19  Yes Georgette Shell, MD  DULoxetine (CYMBALTA) 20 MG capsule Take 20 mg by mouth daily.   Yes [provider]  emollient (BIAFINE) cream Apply 1 application. topically daily as needed (dry skin). Arms chest  back legs   Yes [provider]  ferrous sulfate 325 (65 FE) MG tablet Take 325 mg by mouth daily with breakfast.   Yes [provider]  HYDROcodone-acetaminophen (NORCO/VICODIN) 5-325 MG tablet Take 1-2 tablets by mouth every 6 (six) hours as needed for moderate pain. 01/02/21  Yes Duanne Guess, PA-C  Melatonin 2.5 MG CHEW Chew  2.5 mg by mouth at bedtime.   Yes [provider]  Menthol-Zinc Oxide (CALMOSEPTINE) 0.44-20.6 % OINT Apply 1 application. topically every 8 (eight) hours as needed (itching). Apply to buttocks   Yes [provider]  methocarbamol (ROBAXIN) 500 MG tablet Take 1 tablet (500 mg total) by mouth every 6 (six) hours as needed for muscle spasms. 01/05/21  Yes Annita Brod, MD  ondansetron (ZOFRAN-ODT) 8 MG disintegrating tablet Take 8 mg by mouth every 8 (eight) hours as needed for nausea or vomiting.   Yes [provider]  pantoprazole (PROTONIX) 40 MG tablet Take 40 mg by mouth daily.   Yes [provider]  polyethylene glycol powder (GLYCOLAX/MIRALAX) 17 GM/SCOOP powder Take 17 g by mouth daily. MIX 17GM (1 CAPFUL) BY MOUTH ONCE EVERY DAY MIX 17 GRAMS (USE BOTTLE CAP) IN LIQUID OF CHOICE AS DIRECTED TO PREVENT CONSTIPATION 09/17/20  Yes [provider]  prochlorperazine (COMPAZINE) 10 MG tablet Take 10 mg by mouth every 4 (four) hours as needed for nausea or vomiting.   Yes [provider]  senna-docusate (SENOKOT-S) 8.6-50 MG tablet Take 1 tablet by mouth at bedtime as needed for mild constipation. 09/18/19  Yes Georgette Shell, MD  tamsulosin (FLOMAX) 0.4 MG CAPS capsule Take 0.4 mg by mouth daily.   Yes [provider]  vitamin C (ASCORBIC ACID) 500 MG tablet Take 500 mg by mouth daily.   Yes [provider]  Ensure Max Protein (ENSURE MAX PROTEIN) LIQD Take 330 mLs (11 oz total) by mouth at bedtime. Patient not taking: Reported on 07/07/2021 01/05/21   Annita Brod, MD   feeding supplement, GLUCERNA SHAKE, (GLUCERNA SHAKE) LIQD Take 237 mLs by mouth 2 (two) times daily between meals. Patient not taking: Reported on 07/07/2021 01/05/21   Annita Brod, MD  metFORMIN (GLUCOPHAGE) 500 MG tablet Take 1 tablet (500 mg total) by mouth daily with breakfast. 09/18/19 09/17/20  Georgette Shell, MD  ondansetron (ZOFRAN) 4 MG tablet Take 1 tablet (4 mg total) by mouth every 6 (six) hours as needed for nausea. Patient not taking: Reported on 07/07/2021 09/16/19   Reche Dixon, PA-C    Discontinued Meds:   Medications Discontinued During This Encounter  Medication Reason   vancomycin (VANCOCIN) IVPB 1000 mg/200 mL premix    insulin regular (NOVOLIN R) 100 units/mL injection Change in therapy   omeprazole (PRILOSEC) 20 MG capsule Discontinued by provider   Multiple Vitamins-Minerals (MULTIVITAMIN ADULT, MINERALS,) TABS Completed Course   lactated ringers infusion    emollient (BIAFINE) cream 1 application.    Menthol-Zinc Oxide 4.78-29.5 % OINT 1 application.    vancomycin (VANCOREADY) IVPB 500 mg/100 mL    atorvastatin (LIPITOR) tablet 20 mg    aspirin EC tablet 81 mg    vancomycin variable dose per unstable renal function (pharmacist dosing)    metroNIDAZOLE (FLAGYL) IVPB 500 mg    ceFEPIme (MAXIPIME) 2 g in sodium chloride 0.9 % 100 mL IVPB    DAPTOmycin (CUBICIN) 550 mg in sodium chloride 0.9 % IVPB    heparin ADULT infusion 100 units/mL (25000 units/277mL)    heparin injection 5,000 Units     Social History:  reports that he has been smoking. He has never used smokeless tobacco. He reports that he does not currently use alcohol after a past usage of about 35.0 standard drinks per week. No history on file for drug use.  Family History:   Family History  Problem Relation Age of  Onset   Diabetes Other     Blood pressure (!) 90/58, pulse 86, temperature 97.9 F (36.6 C), temperature source Oral, resp. rate 15, height 5\' 7"  (1.702 m), weight 80.1 kg, SpO2  99 %. General appearance: alert, cooperative, and appears stated age Head: Normocephalic, without obvious abnormality, atraumatic Eyes: conj pallor Neck: no adenopathy, no carotid bruit, no JVD, supple, symmetrical, trachea midline, and thyroid not enlarged, symmetric, no tenderness/mass/nodules Back: symmetric, no curvature. ROM normal. No CVA tenderness. Resp: clear to auscultation bilaterally and poor effort Cardio: irregularly irregular rhythm GI: soft, non-tender; bowel sounds normal; no masses,  no organomegaly Extremities: edema tr Pulses: 2+ and symmetric       Kayline Sheer, Hunt Oris, MD 07/09/2021, 10:09 AM

## 2021-07-09 NOTE — Significant Event (Signed)
Patient stated to the nurse that he wants to change his CODE STATUS to DNR.  I discussed with the patient about his CODE STATUS.  Patient states that if his heart stops he does not want to be on a ventilator or have any CPR.  He wants to continue the present medications and treatment.  Patient verbalizes understanding of the decision.  I explained to him about his decision and he confirms.  Patient is a DNR.  Gean Birchwood

## 2021-07-09 NOTE — Plan of Care (Signed)
  Problem: Clinical Measurements: Goal: Respiratory complications will improve Outcome: Progressing Goal: Cardiovascular complication will be avoided Outcome: Progressing   Problem: Activity: Goal: Risk for activity intolerance will decrease Outcome: Progressing   Problem: Elimination: Goal: Will not experience complications related to urinary retention Outcome: Progressing   Problem: Pain Managment: Goal: General experience of comfort will improve Outcome: Progressing   Problem: Safety: Goal: Ability to remain free from injury will improve Outcome: Progressing   Problem: Skin Integrity: Goal: Risk for impaired skin integrity will decrease Outcome: Progressing

## 2021-07-09 NOTE — Progress Notes (Signed)
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  Patient reported symptoms started after he has received chemotherapy.  Patient was seen as STEMI code in the ED due to ST elevation on initila EKG but then later canceled by cardiologist Dr. Burt Knack.  Patient presented with signs of severe sepsis (fever 102, HR 113, RR 40, BP 80/63 which improved after IV fluid boluses.  Patient was started on empiric antibiotics( vancomycin metronidazole and cefepime).  Cardiology is also consulted for elevated troponin and started on heparin drip.  Cardiology recommended cardiac work-up once patient is hemodynamically optimized.  Infectious disease consulted recommended TEE and removal of port.  Assessment & Plan:   Principal Problem:   Severe sepsis ([place]) Active Problems:   NSTEMI (non-ST elevated myocardial infarction) ([place])   Hypotension   Acute kidney injury superimposed on chronic kidney disease ([place])   Uncontrolled type 2 diabetes mellitus with hyperglycemia, with long-term current use of insulin (HCC)   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HCC)   Macrocytic anemia   Alcohol dependence (HCC)   Tobacco abuse   Gastro-esophageal reflux disease without esophagitis   Transaminitis   Hyperbilirubinemia   Thrombocytopenia (HCC)  Severe sepsis secondary to unknown source: MRSA bacteremia: Patient presented with fever 102.9, tachycardia, tachypnea, high lactic acid which improved with fluid boluses.  Patient noted to have intermittent productive cough, but which appears unchanged from baseline.   Chest x-ray without  any abnormality.  UA: no signs of infection. Continue empiric antibiotics(vancomycin, cefepime, Flagyl). There is a possibility of cholangitis based on CT at [place] that was done yesterday. Cultures grew MRSA Infectious disease consulted recommended IR consult for port removal. Chemo-Port removed.  Tolerated well. Patient is scheduled to have TEE tomorrow to evaluate for endocarditis. MRI left knee/femur to evaluate joint or hardware for infection. Antibiotics changed from vancomycin to daptomycin given AKI. Flagyl and cefepime discontinued.  NSTEMI: Patient reports chest discomfort with some difficulty breathing. Initial labs revealed high-sensitivity troponin trended up to 10,000. Continue heparin for 48 hours. Echocardiogram shows normal inferior wall motion abnormality.   Cardiology consulted and recommended not a candidate for cath given his infection and acute renal failure.  Hypotension: Patient initially noted to have low blood pressure which improved with fluid resuscitation. Continue to monitor, continue IV hydration.  AKI on CKD stage IIIb: Baseline serum creatinine 1.4 last checked in [facility].   Serum creatinine on admission 2.3 >2.43 > 3.20>4.50 Likely secondary to nausea and vomiting after last chemotherapy. Continue IV hydration, avoid nephrotoxic medications. Disease consulted.  Patient is not a candidate for RRT at this time but potentially require in 24 to 48 hours.  Type 2 diabetes uncontrolled. Last hemoglobin A1c 7.5 Continue sliding scale.  Avoid p.o. diabetic medications.  Metastatic colon cancer: Patient with known metastatic colon cancer s/p resection currently receiving chemotherapy.   Discussed with oncologist recommended to hold chemo in the setting of acute infection.  Macrocytic anemia Chronic.  Hemoglobin 9.4 , denies any obvious visible bleeding  Hyperlipidemia: Continue atorvastatin  Hyperbilirubinemia / transaminitis: No acute finding  noted on CT abdomen/ pelvis. Continue to monitor  Cholelithiasis: CT  scan at [facility] had noted concern for possible choledocholelithiasis Repeat CT abdomen does not report cholelithiasis.  Thrombocytopenia: Chronic but is stable.  No signs of any bleeding.  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  BPH: Flomax 0.4 mg daily  GERD: Continue Protonix 40 mg daily  DVT prophylaxis: Heparin gtt. Code Status: Full code Family Communication: No family at bedside Disposition Plan:   Status is: Inpatient Remains inpatient appropriate because: Admitted for severe sepsis secondary to unknown source, cultures positive for MRSA , elevated troponin likely due to sepsis cardiology consulted, recommended patient is not stable for cardiac work-up given sepsis.  Chemo-Port removed.  Scheduled for TEE tomorrow   Consultants:  Cardiology Infectious diseases  Procedures: CT chest, abdomen and pelvis Antimicrobials:   Anti-infectives (From admission, onward)    Start     Dose/Rate Route Frequency Ordered Stop   07/09/21 1000  vancomycin (VANCOREADY) IVPB 500 mg/100 mL  Status:  Discontinued        500 mg 100 mL/hr over 60 Minutes Intravenous Every 48 hours 07/07/21 0705 07/08/21 0725   07/08/21 2000  DAPTOmycin (CUBICIN) 550 mg in sodium chloride 0.9 % IVPB  Status:  Discontinued        8 mg/kg  71.4 kg 122 mL/hr over 30 Minutes Intravenous Daily 07/08/21 1000 07/08/21 1427   07/08/21 2000  DAPTOmycin (CUBICIN) 550 mg in sodium chloride 0.9 % IVPB        8 mg/kg  71.4 kg 122 mL/hr over 30 Minutes Intravenous Every 48 hours 07/08/21 1427     07/08/21 0800  ceFEPIme (MAXIPIME) 2 g in sodium chloride 0.9 % 100 mL IVPB  Status:  Discontinued        2 g 200 mL/hr over 30 Minutes Intravenous Every 24 hours 07/07/21 0705 07/08/21 1206   07/08/21 0725  vancomycin variable dose per unstable renal function (pharmacist dosing)  Status:  Discontinued         Does not apply See admin instructions  07/08/21 0725 07/08/21 1000   07/07/21 1500  metroNIDAZOLE (FLAGYL) IVPB 500 mg  Status:  Discontinued        500 mg 100 mL/hr over 60 Minutes Intravenous Every 12 hours 07/07/21 0650 07/08/21 1206   07/07/21 0415  ceFEPIme (MAXIPIME) 2 g in sodium chloride 0.9 % 100 mL IVPB        2 g 200 mL/hr over 30 Minutes Intravenous  Once 07/07/21 0400 07/07/21 0505   07/07/21 0415  metroNIDAZOLE (FLAGYL) IVPB 500 mg        500 mg 100 mL/hr over 60 Minutes Intravenous  Once 07/07/21 0400 07/07/21 0545   07/07/21 0415  vancomycin (VANCOCIN) IVPB 1000 mg/200 mL premix  Status:  Discontinued        1,000 mg 200 mL/hr over 60 Minutes Intravenous  Once 07/07/21 0400 07/07/21 0402   07/07/21 0415  vancomycin (VANCOREADY) IVPB 1250 mg/250 mL        1,250 mg 166.7 mL/hr over 90 Minutes Intravenous  Once 07/07/21 0402 07/07/21 9629        Subjective: Patient was seen and examined at bedside.  Overnight events noted. Patient reports feeling tired after having Chemo-Port removed.   He is going to have TEE tomorrow.  Denies any chest pain.  Objective: Vitals:   07/09/21 0840 07/09/21 0845 07/09/21 0850 07/09/21 0855  BP: 90/60 104/60 (!) 86/61 (!) 90/58  Pulse: 80 81 86 86  Resp: (!) 30 18 18 15   Temp:  TempSrc:      SpO2: 97% 98% 99% 99%  Weight:      Height:        Intake/Output Summary (Last 24 hours) at 07/09/2021 1446 Last data filed at 07/09/2021 0700 Gross per 24 hour  Intake 348.98 ml  Output 50 ml  Net 298.98 ml   Filed Weights   07/07/21 1238 07/08/21 0449 07/09/21 0700  Weight: 48.3 kg 71.4 kg 80.1 kg    Examination:  General exam: Appears comfortable, not in any acute distress.  Deconditioned Respiratory system: Clear bilaterally, no wheezing, no crackles, normal respiratory effort. Cardiovascular system: S1-S2 heard, regular rate and rhythm, no murmur. Gastrointestinal system: Abdomen is soft, non tender, non distended, normal bowel sounds , colostomy noted.   Central  nervous system: Alert and oriented x 3 . No focal neurological deficits. Extremities: No edema, no cyanosis, no clubbing. Skin: No rashes, lesions or ulcers Psychiatry: Judgement and insight appear normal. Mood & affect appropriate.     Data Reviewed: I have personally reviewed following labs and imaging studies  CBC: Recent Labs  Lab 07/07/21 0347 07/08/21 0133 07/09/21 0340  WBC 10.9* 10.2 9.1  NEUTROABS 9.4*  --   --   HGB 9.4* 8.8* 9.2*  HCT 27.0* 26.3* 27.0*  MCV 98.2 99.6 99.6  PLT 79* 51* 85*   Basic Metabolic Panel: Recent Labs  Lab 07/07/21 0347 07/08/21 0133 07/09/21 0340  NA 129* 130* 128*  K 4.0 4.6 4.1  CL 99 102 98  CO2 20* 18* 20*  GLUCOSE 215* 117* 125*  BUN 36* 45* 59*  CREATININE 2.43* 3.20* 4.56*  CALCIUM 8.0* 8.0* 8.2*  MG 1.5*  --  1.7  PHOS  --   --  4.0   GFR: Estimated Creatinine Clearance: 13.1 mL/min (A) (by C-G formula based on SCr of 4.56 mg/dL (H)). Liver Function Tests: Recent Labs  Lab 07/07/21 0347 07/08/21 0133 07/09/21 0340  AST 92* 85* 62*  ALT 33 27 26  ALKPHOS 80 76 79  BILITOT 2.0* 1.7* 1.3*  PROT 6.1* 5.6* 5.9*  ALBUMIN 2.6* 2.2* 2.4*   No results for input(s): LIPASE, AMYLASE in the last 168 hours. No results for input(s): AMMONIA in the last 168 hours. Coagulation Profile: Recent Labs  Lab 07/07/21 0347  INR 2.3*   Cardiac Enzymes: Recent Labs  Lab 07/07/21 1134 07/09/21 0340  CKTOTAL 531* 187   BNP (last 3 results) No results for input(s): PROBNP in the last 8760 hours. HbA1C: Recent Labs    07/07/21 0347  HGBA1C 7.5*   CBG: Recent Labs  Lab 07/08/21 1131 07/08/21 1601 07/08/21 2136 07/09/21 0800 07/09/21 1204  GLUCAP 144* 189* 127* 140* 141*   Lipid Profile: No results for input(s): CHOL, HDL, LDLCALC, TRIG, CHOLHDL, LDLDIRECT in the last 72 hours. Thyroid Function Tests: No results for input(s): TSH, T4TOTAL, FREET4, T3FREE, THYROIDAB in the last 72 hours. Anemia Panel: No results  for input(s): VITAMINB12, FOLATE, FERRITIN, TIBC, IRON, RETICCTPCT in the last 72 hours. Sepsis Labs: Recent Labs  Lab 07/07/21 0347 07/07/21 0608 07/07/21 0836 07/07/21 1134 07/07/21 1643 07/07/21 1903  PROCALCITON 27.09  --   --   --   --   --   LATICACIDVEN 2.0*   < > 2.6* 2.8* 3.3* 1.7   < > = values in this interval not displayed.    Recent Results (from the past 240 hour(s))  Blood Culture (routine x 2)     Status: Abnormal (Preliminary result)  Collection Time: 07/07/21  4:00 AM   Specimen: BLOOD RIGHT HAND  Result Value Ref Range Status   Specimen Description BLOOD RIGHT HAND  Final   Special Requests   Final    BOTTLES DRAWN AEROBIC AND ANAEROBIC Blood Culture results may not be optimal due to an inadequate volume of blood received in culture bottles   Culture  Setup Time   Final    GRAM POSITIVE COCCI IN CLUSTERS IN BOTH AEROBIC AND ANAEROBIC BOTTLES CRITICAL RESULT CALLED TO, READ BACK BY AND VERIFIED WITH: PHARMD EDEN BREWINGTON ON 07/07/21 @ 1640 BY DRT    Culture (A)  Final    METHICILLIN RESISTANT STAPHYLOCOCCUS AUREUS Sent to [facility] for further susceptibility testing. Performed at [facility], [place] [address] [place], [place] [address]    Report Status PENDING  Incomplete   Organism ID, Bacteria METHICILLIN RESISTANT STAPHYLOCOCCUS AUREUS  Final      Susceptibility   Methicillin resistant staphylococcus aureus - MIC*    CIPROFLOXACIN >=8 RESISTANT Resistant     ERYTHROMYCIN RESISTANT Resistant     GENTAMICIN <=0.5 SENSITIVE Sensitive     OXACILLIN >=4 RESISTANT Resistant     TETRACYCLINE <=1 SENSITIVE Sensitive     VANCOMYCIN <=0.5 SENSITIVE Sensitive     TRIMETH/SULFA <=10 SENSITIVE Sensitive     CLINDAMYCIN RESISTANT Resistant     RIFAMPIN <=0.5 SENSITIVE Sensitive     Inducible Clindamycin POSITIVE Resistant     * METHICILLIN RESISTANT STAPHYLOCOCCUS AUREUS  Blood Culture ID Panel (Reflexed)     Status: Abnormal   Collection Time: 07/07/21   4:00 AM  Result Value Ref Range Status   Enterococcus faecalis NOT DETECTED NOT DETECTED Final   Enterococcus Faecium NOT DETECTED NOT DETECTED Final   Listeria monocytogenes NOT DETECTED NOT DETECTED Final   Staphylococcus species DETECTED (A) NOT DETECTED Final    Comment: CRITICAL RESULT CALLED TO, READ BACK BY AND VERIFIED WITH: PHARMD EDEN BREWINGTON ON 07/07/21 @ 1640 BY DRT    Staphylococcus aureus (BCID) DETECTED (A) NOT DETECTED Final    Comment: Methicillin (oxacillin)-resistant Staphylococcus aureus (MRSA). MRSA is predictably resistant to beta-lactam antibiotics (except ceftaroline). Preferred therapy is vancomycin unless clinically contraindicated. Patient requires contact precautions if  hospitalized. CRITICAL RESULT CALLED TO, READ BACK BY AND VERIFIED WITH: PHARMD EDEN BREWINGTON ON 07/07/21 @ 1640 BY DRT    Staphylococcus epidermidis NOT DETECTED NOT DETECTED Final   Staphylococcus lugdunensis NOT DETECTED NOT DETECTED Final   Streptococcus species NOT DETECTED NOT DETECTED Final   Streptococcus agalactiae NOT DETECTED NOT DETECTED Final   Streptococcus pneumoniae NOT DETECTED NOT DETECTED Final   Streptococcus pyogenes NOT DETECTED NOT DETECTED Final   A.calcoaceticus-baumannii NOT DETECTED NOT DETECTED Final   Bacteroides fragilis NOT DETECTED NOT DETECTED Final   Enterobacterales NOT DETECTED NOT DETECTED Final   Enterobacter cloacae complex NOT DETECTED NOT DETECTED Final   Escherichia coli NOT DETECTED NOT DETECTED Final   Klebsiella aerogenes NOT DETECTED NOT DETECTED Final   Klebsiella oxytoca NOT DETECTED NOT DETECTED Final   Klebsiella pneumoniae NOT DETECTED NOT DETECTED Final   Proteus species NOT DETECTED NOT DETECTED Final   Salmonella species NOT DETECTED NOT DETECTED Final   Serratia marcescens NOT DETECTED NOT DETECTED Final   Haemophilus influenzae NOT DETECTED NOT DETECTED Final   Neisseria meningitidis NOT DETECTED NOT DETECTED Final   Pseudomonas  aeruginosa NOT DETECTED NOT DETECTED Final   Stenotrophomonas maltophilia NOT DETECTED NOT DETECTED Final   Candida albicans NOT DETECTED NOT  DETECTED Final   Candida auris NOT DETECTED NOT DETECTED Final   Candida glabrata NOT DETECTED NOT DETECTED Final   Candida krusei NOT DETECTED NOT DETECTED Final   Candida parapsilosis NOT DETECTED NOT DETECTED Final   Candida tropicalis NOT DETECTED NOT DETECTED Final   Cryptococcus neoformans/gattii NOT DETECTED NOT DETECTED Final   Meth resistant mecA/C and MREJ DETECTED (A) NOT DETECTED Final    Comment: CRITICAL RESULT CALLED TO, READ BACK BY AND VERIFIED WITH: PHARMD EDEN BREWINGTON ON 07/07/21 @ 1640 BY DRT Performed at [facility], [place] [address], [place], [place] [address]   Resp Panel by RT-PCR (Flu A&B, Covid) Nasopharyngeal Swab     Status: None   Collection Time: 07/07/21  4:06 AM   Specimen: Nasopharyngeal Swab; Nasopharyngeal(NP) swabs in vial transport medium  Result Value Ref Range Status   SARS Coronavirus 2 by RT PCR NEGATIVE NEGATIVE Final    Comment: (NOTE) SARS-CoV-2 target nucleic acids are NOT DETECTED.  The SARS-CoV-2 RNA is generally detectable in upper respiratory specimens during the acute phase of infection. The lowest concentration of SARS-CoV-2 viral copies this assay can detect is 138 copies/mL. A negative result does not preclude SARS-Cov-2 infection and should not be used as the sole basis for treatment or other patient management decisions. A negative result may occur with  improper specimen collection/handling, submission of specimen other than nasopharyngeal swab, presence of viral mutation(s) within the areas targeted by this assay, and inadequate number of viral copies(<138 copies/mL). A negative result must be combined with clinical observations, patient history, and epidemiological information. The expected result is Negative.  Fact Sheet for Patients:   EntrepreneurPulse.com.au  Fact Sheet for Healthcare Providers:  IncredibleEmployment.be  This test is no t yet approved or cleared by the [place] FDA and  has been authorized for detection and/or diagnosis of SARS-CoV-2 by FDA under an Emergency Use Authorization (EUA). This EUA will remain  in effect (meaning this test can be used) for the duration of the COVID-19 declaration under Section 564(b)(1) of the Act, 21 U.S.C.section 360bbb-3(b)(1), unless the authorization is terminated  or revoked sooner.       Influenza A by PCR NEGATIVE NEGATIVE Final   Influenza B by PCR NEGATIVE NEGATIVE Final    Comment: (NOTE) The Xpert Xpress SARS-CoV-2/FLU/RSV plus assay is intended as an aid in the diagnosis of influenza from Nasopharyngeal swab specimens and should not be used as a sole basis for treatment. Nasal washings and aspirates are unacceptable for Xpert Xpress SARS-CoV-2/FLU/RSV testing.  Fact Sheet for Patients: EntrepreneurPulse.com.au  Fact Sheet for Healthcare Providers: IncredibleEmployment.be  This test is not yet approved or cleared by the [place] FDA and has been authorized for detection and/or diagnosis of SARS-CoV-2 by FDA under an Emergency Use Authorization (EUA). This EUA will remain in effect (meaning this test can be used) for the duration of the COVID-19 declaration under Section 564(b)(1) of the Act, 21 U.S.C. section 360bbb-3(b)(1), unless the authorization is terminated or revoked.  Performed at [facility], [place] [address], [place], [place] [address]   Blood Culture (routine x 2)     Status: Abnormal (Preliminary result)   Collection Time: 07/07/21  4:15 AM   Specimen: BLOOD  Result Value Ref Range Status   Specimen Description BLOOD LEFT ANTECUBITAL  Final   Special Requests   Final    BOTTLES DRAWN AEROBIC AND ANAEROBIC Blood Culture adequate volume   Culture   Setup Time  Final    GRAM POSITIVE COCCI IN CLUSTERS IN BOTH AEROBIC AND ANAEROBIC BOTTLES CRITICAL VALUE NOTED.  VALUE IS CONSISTENT WITH PREVIOUSLY REPORTED AND CALLED VALUE. CRITICAL RESULT CALLED TO, READ BACK BY AND VERIFIED WITH: PHARMD ERicki Miller [id number] @1729  FH      Culture (A)  Final    STAPHYLOCOCCUS AUREUS SUSCEPTIBILITIES PERFORMED ON PREVIOUS CULTURE WITHIN THE LAST 5 DAYS. Performed at [facility], [place] [address], [place], [place] [address]    Report Status PENDING  Incomplete  Urine Culture     Status: None   Collection Time: 07/07/21  7:42 AM   Specimen: In/Out Cath Urine  Result Value Ref Range Status   Specimen Description IN/OUT CATH URINE  Final   Special Requests NONE  Final   Culture   Final    NO GROWTH Performed at [facility], [place] [address] [place], [place] [address]    Report Status 07/08/2021 FINAL  Final  Culture, blood (Routine X 2) w Reflex to ID Panel     Status: None (Preliminary result)   Collection Time: 07/08/21  9:25 AM   Specimen: BLOOD LEFT HAND  Result Value Ref Range Status   Specimen Description BLOOD LEFT HAND  Final   Special Requests   Final    BOTTLES DRAWN AEROBIC AND ANAEROBIC Blood Culture adequate volume   Culture   Final    NO GROWTH 1 DAY Performed at [facility], [place] [address] [place], [place] [address]    Report Status PENDING  Incomplete  Culture, blood (Routine X 2) w Reflex to ID Panel     Status: None (Preliminary result)   Collection Time: 07/08/21  9:25 AM   Specimen: BLOOD LEFT HAND  Result Value Ref Range Status   Specimen Description BLOOD LEFT HAND  Final   Special Requests   Final    BOTTLES DRAWN AEROBIC AND ANAEROBIC Blood Culture adequate volume   Culture   Final    NO GROWTH 1 DAY Performed at [facility], [place] [address] [place], [place] [address]    Report Status PENDING  Incomplete  MRSA Next Gen by PCR, Nasal     Status: Abnormal   Collection Time: 07/08/21  3:07  PM   Specimen: Nasal Mucosa; Nasal Swab  Result Value Ref Range Status   MRSA by PCR Next Gen DETECTED (A) NOT DETECTED Final    Comment: RESULT CALLED TO, READ BACK BY AND VERIFIED WITH: L MITCHELL,RN@2132  07/08/21 [place] (NOTE) The GeneXpert MRSA Assay (FDA approved for NASAL specimens only), is one component of a comprehensive MRSA colonization surveillance program. It is not intended to diagnose MRSA infection nor to guide or monitor treatment for MRSA infections. Test performance is not FDA approved in patients less than 5 years old. Performed at [facility], [place] [address] [place], [place] [address]          Radiology Studies: MR FEMUR LEFT WO CONTRAST  Result Date: 07/09/2021 CLINICAL DATA:  History of left hip ORIF. Evaluate soft tissue infection. EXAM: MR OF THE LEFT FEMUR WITHOUT CONTRAST TECHNIQUE: Multiplanar, multisequence MR imaging of the left femur was performed. No intravenous contrast was administered. COMPARISON:  None Available. FINDINGS: Bones/Joint/Cartilage No acute fracture or dislocation. Prior ORIF of a healed intertrochanteric fracture with susceptibility artifact partially obscuring the adjacent soft tissue and osseous structures. Prior ORIF of a left femoral diaphysis fracture transfixed with a lateral sideplate and multiple interlocking screws  with susceptibility artifact partially obscuring the adjacent soft tissue and osseous structures. No aggressive osseous lesion. Normal alignment. No joint effusion. No marrow signal abnormality. Partial-thickness cartilage loss of the left femoral head and acetabulum. Right total hip arthroplasty partially visualized. No periarticular fluid collection or osteolysis. Muscles and Tendons Mild edema in the tensor fascia lata and vastus intermedius muscle adjacent to the hip concerning for mild myositis with surrounding soft tissue edema between the muscle planes. Mild muscle edema in the distal vastus lateralis muscle  concerning for myositis versus muscle strain. No intramuscular fluid collection or hematoma. Soft tissue Generalized anasarca. No soft tissue mass. No fluid collection or hematoma. IMPRESSION: 1. No evidence of septic arthritis of the left hip. Mild-moderate osteoarthritis of the left hip. 2. Mild edema in the tensor fascia lata and vastus intermedius muscle adjacent to the hip concerning for mild myositis with surrounding soft tissue edema between the muscle planes. Mild muscle edema in the distal vastus lateralis muscle concerning for myositis versus muscle strain. No fluid collection to suggest an abscess. 3. Evaluation for infection related to the hardware is extremely limited given the degree of artifact resulting from the metallic hardware. Electronically Signed   By: Kathreen Devoid M.D.   On: 07/09/2021 08:34   MR KNEE LEFT WO CONTRAST  Result Date: 07/09/2021 CLINICAL DATA:  History of knee replacement. Evaluate for infection. EXAM: MRI OF THE LEFT KNEE WITHOUT CONTRAST TECHNIQUE: Multiplanar, multisequence MR imaging of the knee was performed. No intravenous contrast was administered. COMPARISON:  None Available. FINDINGS: Bones/Joint/Cartilage No acute fracture or dislocation. 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 No periarticular fluid collection, hematoma or osteolysis. No evidence of hardware failure or complication within limitations of the artifact. Normal alignment. No joint effusion. No marrow signal abnormality. Muscles and Tendons Soft tissue edema involving the distal aspect of the vastus lateralis muscle concerning for muscle strain versus myositis. No intramuscular fluid collection or hematoma. Quadriceps tendon and patellar tendon are intact. Soft tissue No fluid collection or hematoma. No soft tissue mass. Generalized anasarca. IMPRESSION: 1. Left total knee arthroplasty with susceptibility  artifact severely limiting evaluation of the adjacent soft tissue and osseous structures. No periarticular fluid collection, hematoma or osteolysis. No evidence of hardware failure or complication. 2. Soft tissue edema involving the distal aspect of the vastus lateralis muscle concerning for muscle strain versus myositis. Electronically Signed   By: Kathreen Devoid M.D.   On: 07/09/2021 08:37    Scheduled Meds:  vitamin C  500 mg Oral Daily   atorvastatin  40 mg Oral QHS   Chlorhexidine Gluconate Cloth  6 each Topical Q0600   docusate sodium  100 mg Oral BID   DULoxetine  20 mg Oral Daily   ferrous sulfate  325 mg Oral Q breakfast   insulin aspart  0-6 Units Subcutaneous TID WC   insulin aspart  3 Units Subcutaneous TID WC   lidocaine       melatonin  3 mg Oral QHS   mupirocin ointment  1 application. Nasal BID   pantoprazole  40 mg Oral Daily   polyethylene glycol  17 g Oral Daily   sodium chloride flush  3 mL Intravenous Q12H   tamsulosin  0.4 mg Oral Daily   Continuous Infusions:  sodium chloride 10 mL/hr at 07/08/21 1804   DAPTOmycin (CUBICIN)  IV 550 mg (07/08/21 2045)   heparin 450 Units/hr (07/09/21 1145)  LOS: 2 days    Time spent: 35 mins    Loyola Santino, MD [facility]   If 7PM-7AM, please contact night-coverage

## 2021-07-09 NOTE — Progress Notes (Signed)
   07/09/21 1934  Clinical Encounter Type  Visited With Health care provider;Patient Corey Skeeters D. Alroy Dust, RN)  Visit Type Psychological support;Initial  Referral From Nurse Corey Skeeters D. Alroy Dust, RN)  Consult/Referral To Chaplain Corey Gonzalez)  Spiritual Encounters  Spiritual Needs Emotional   Spiritual Care Consultation page to meet with Mr. Corey Gonzalez. [place], because:        "Patient needing comfort, very distraught and crying. Going through a lot with metastatic cancer  treatments and now hospitalization."  Upon arrival met with patient's evening nurse, Corey Gonzalez, Therapist, sports. She reported that Mr. Corey Gonzalez was very distraught and stating that he just wants to die.  Met Mr. Corey Gonzalez at patient's bedside. I asked Mr. Corey Gonzalez if he would like me to sit with him. Chaplain initiated conversation with Mr. Corey Gonzalez with open ended questions; inviting him to talk about the reason for his sadness. What made today worse than other days. I asked if he was having physical pain? I asked Mr. Corey Gonzalez to share his life story. Mr. Corey Gonzalez response was a repeated:  "Take Me! I want to die!" We sat silently for approximately 20 minutes. Every once in a while he would open his eyes to see if I had left, then close his eyes again. I told Mr. Corey Gonzalez if he changed his mind a wanted to talk, that he should have the nurse page chaplain services. Staff was not surprised by his responses.  Chaplain will be available for further consultation she Mr. Corey Gonzalez request. Chaplain Melvenia Beam, M. Min., [phone].

## 2021-07-09 NOTE — TOC Initial Note (Signed)
Transition of Care [facility]) - Initial/Assessment Note    Patient Details  Name: Corey Gonzalez MRN: [id number] Date of Birth: [birth year]  Transition [facility]) CM/SW Contact:    Bethena Roys, RN Phone Number: 07/09/2021, 3:37 PM  Clinical Narrative:  Risk for readmission assessment completed. PTA patient was from [facility]. Case Manager did call the [facility] to make them aware of hospitalization and awaiting call back. CSW did reach out to [facility] and the patient's bed is still available. Patient can return back to the facility over the weekend. Please call [facility] Admissions work cell @ [phone] if he discharges over the weekend.   Expected Discharge Plan: Skilled Nursing Facility Barriers to Discharge: Continued Medical Work up   Patient Goals and CMS Choice Patient states their goals for this hospitalization and ongoing recovery are:: return to facility      Expected Discharge Plan and Services Expected Discharge Plan: Skilled Nursing Facility In-house Referral: Clinical Social Work Discharge Planning Services: CM Consult Post Acute Care Choice: [place] Living arrangements for the past 2 months: [place]                   DME Agency: NA     Prior Living Arrangements/Services Living arrangements for the past 2 months: [place] Lives with:: Facility Resident Patient language and need for interpreter reviewed:: Yes Do you feel safe going back to the place where you live?: Yes      Need for Family Participation in Patient Care: Yes (Comment) Care giver support system in place?: Yes (comment)   Criminal Activity/Legal Involvement Pertinent to Current Situation/Hospitalization: No - Comment as needed  Activities of Daily Living Home Assistive Devices/Equipment: None ADL Screening (condition at time of admission) Patient's cognitive ability adequate to safely complete  daily activities?: Yes Is the patient deaf or have difficulty hearing?: No Does the patient have difficulty seeing, even when wearing glasses/contacts?: No Does the patient have difficulty concentrating, remembering, or making decisions?: No Patient able to express need for assistance with ADLs?: Yes Does the patient have difficulty dressing or bathing?: Yes Independently performs ADLs?: No Communication: Independent Dressing (OT): Dependent Is this a change from baseline?: Pre-admission baseline Grooming: Dependent Is this a change from baseline?: Pre-admission baseline Feeding: Needs assistance Is this a change from baseline?: Pre-admission baseline Bathing: Dependent Is this a change from baseline?: Pre-admission baseline Toileting: Dependent Is this a change from baseline?: Pre-admission baseline In/Out Bed: Dependent Is this a change from baseline?: Pre-admission baseline Walks in Home: Dependent Is this a change from baseline?: Pre-admission baseline Does the patient have difficulty walking or climbing stairs?: Yes Weakness of Legs: Both Weakness of Arms/Hands: Both  Permission Sought/Granted Permission sought to share information with : Case Manager                Emotional Assessment Appearance:: Appears stated age Attitude/Demeanor/Rapport: Engaged Affect (typically observed): Appropriate   Alcohol / Substance Use: Not Applicable Psych Involvement: No (comment)  Admission diagnosis:  NSTEMI (non-ST elevated myocardial infarction) (Corey Gonzalez) [I21.4] AKI (acute kidney injury) (Corey Gonzalez) [N17.9] Severe sepsis (Corey Gonzalez) [A41.9, R65.20] Patient Active Problem List   Diagnosis Date Noted   Severe sepsis (Corey Gonzalez) 07/07/2021   NSTEMI (non-ST elevated myocardial infarction) (Corey Gonzalez) 07/07/2021   Hypotension 07/07/2021   Acute kidney injury superimposed on chronic kidney disease ([name]) 07/07/2021   Uncontrolled type 2 diabetes mellitus with hyperglycemia, with long-term current use of  insulin (Corey Gonzalez)  07/07/2021   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Corey Gonzalez) 07/07/2021   Transaminitis 07/07/2021   Hyperbilirubinemia 07/07/2021   Macrocytic anemia 07/07/2021   Thrombocytopenia (Corey Gonzalez) 07/07/2021   COVID-19 virus infection 01/05/2021   Malnutrition of moderate degree 01/02/2021   Fall at home, initial encounter 12/30/2020   Tobacco abuse 12/30/2020   Gastro-esophageal reflux disease without esophagitis 12/30/2020   Femur fracture, left (Corey Gonzalez) 12/30/2020   Closed left hip fracture, initial encounter (Corey Gonzalez) 09/14/2019   Diabetes mellitus without complication (Corey Gonzalez)    Chronic kidney disease, stage 3a (Corey Gonzalez)    Hypertension    Alcohol dependence (Corey Gonzalez)    PCP:  Wendie Agreste, NP Pharmacy:   [facility] [address] [place] [address] Phone: [phone] Fax: [phone]   Readmission Risk Interventions    07/09/2021    3:35 PM  Readmission Risk Prevention Plan  Transportation Screening Complete  Medication Review (RN Care Manager) Complete  PCP or Specialist appointment within 3-5 days of discharge Complete  HRI or Home Care Consult Complete  SW Recovery Care/Counseling Consult Complete  Palliative Care Screening Not [place] Complete

## 2021-07-09 NOTE — Procedures (Signed)
  Procedure:  Removal of the infected Rt IJ chest port Preprocedure diagnosis: Infected Rt IJ chest port Postprocedure diagnosis: same EBL:    minimal Complications:   none immediate  See full dictation in [facility].  Frazier Richards, MD Main # [phone] Mobile [phone]

## 2021-07-10 ENCOUNTER — Other Ambulatory Visit (HOSPITAL_COMMUNITY): Payer: No Typology Code available for payment source

## 2021-07-10 DIAGNOSIS — Z515 Encounter for palliative care: Secondary | ICD-10-CM | POA: Diagnosis not present

## 2021-07-10 DIAGNOSIS — A419 Sepsis, unspecified organism: Secondary | ICD-10-CM | POA: Diagnosis not present

## 2021-07-10 DIAGNOSIS — I248 Other forms of acute ischemic heart disease: Secondary | ICD-10-CM | POA: Diagnosis not present

## 2021-07-10 DIAGNOSIS — Z66 Do not resuscitate: Secondary | ICD-10-CM

## 2021-07-10 DIAGNOSIS — Z7189 Other specified counseling: Secondary | ICD-10-CM | POA: Diagnosis not present

## 2021-07-10 DIAGNOSIS — N179 Acute kidney failure, unspecified: Secondary | ICD-10-CM | POA: Diagnosis not present

## 2021-07-10 DIAGNOSIS — R778 Other specified abnormalities of plasma proteins: Secondary | ICD-10-CM | POA: Diagnosis not present

## 2021-07-10 DIAGNOSIS — R6520 Severe sepsis without septic shock: Secondary | ICD-10-CM | POA: Diagnosis not present

## 2021-07-10 LAB — MISC LABCORP TEST (SEND OUT): Labcorp test code: 88005

## 2021-07-10 LAB — GLUCOSE, CAPILLARY
Glucose-Capillary: 133 mg/dL — ABNORMAL HIGH (ref 70–99)
Glucose-Capillary: 150 mg/dL — ABNORMAL HIGH (ref 70–99)

## 2021-07-10 LAB — BASIC METABOLIC PANEL
Anion gap: 13 (ref 5–15)
BUN: 69 mg/dL — ABNORMAL HIGH (ref 8–23)
CO2: 16 mmol/L — ABNORMAL LOW (ref 22–32)
Calcium: 8.1 mg/dL — ABNORMAL LOW (ref 8.9–10.3)
Chloride: 100 mmol/L (ref 98–111)
Creatinine, Ser: 5.99 mg/dL — ABNORMAL HIGH (ref 0.61–1.24)
GFR, Estimated: 9 mL/min — ABNORMAL LOW (ref >60)
Glucose, Bld: 137 mg/dL — ABNORMAL HIGH (ref 70–99)
Potassium: 4.8 mmol/L (ref 3.5–5.1)
Sodium: 129 mmol/L — ABNORMAL LOW (ref 135–145)

## 2021-07-10 LAB — CBC
HCT: 28.7 % — ABNORMAL LOW (ref 39.0–52.0)
Hemoglobin: 9.4 g/dL — ABNORMAL LOW (ref 13.0–17.0)
MCH: 33.9 pg (ref 26.0–34.0)
MCHC: 32.8 g/dL (ref 30.0–36.0)
MCV: 103.6 fL — ABNORMAL HIGH (ref 80.0–100.0)
Platelets: 107 10*3/uL — ABNORMAL LOW (ref 150–400)
RBC: 2.77 MIL/uL — ABNORMAL LOW (ref 4.22–5.81)
RDW: 21.7 % — ABNORMAL HIGH (ref 11.5–15.5)
WBC: 7.2 10*3/uL (ref 4.0–10.5)
nRBC: 0.3 % — ABNORMAL HIGH (ref 0.0–0.2)

## 2021-07-10 LAB — APTT: aPTT: 87 seconds — ABNORMAL HIGH (ref 24–36)

## 2021-07-10 LAB — HEPARIN LEVEL (UNFRACTIONATED): Heparin Unfractionated: >1.1 IU/mL — ABNORMAL HIGH (ref 0.30–0.70)

## 2021-07-10 SURGERY — ECHOCARDIOGRAM, TRANSESOPHAGEAL
Anesthesia: Monitor Anesthesia Care

## 2021-07-10 MED ORDER — POLYVINYL ALCOHOL 1.4 % OP SOLN
1.0000 [drp] | Freq: Four times a day (QID) | OPHTHALMIC | Status: DC | PRN
  Filled 2021-07-10: qty 15

## 2021-07-10 MED ORDER — ONDANSETRON HCL 4 MG PO TABS: 4.0000 mg | ORAL_TABLET | Freq: Four times a day (QID) | ORAL | 0 refills | Status: AC | PRN

## 2021-07-10 MED ORDER — HYDROMORPHONE HCL 1 MG/ML IJ SOLN
0.5000 mg | Freq: Four times a day (QID) | INTRAMUSCULAR | Status: DC
Start: 2021-07-10 — End: 2021-07-11

## 2021-07-10 MED ORDER — LORAZEPAM 2 MG/ML IJ SOLN
0.5000 mg | INTRAMUSCULAR | Status: DC | PRN
  Filled 2021-07-10: qty 1

## 2021-07-10 MED ORDER — HYDROMORPHONE HCL 1 MG/ML IJ SOLN
0.5000 mg | INTRAMUSCULAR | Status: DC | PRN
  Administered 2021-07-10 (×2): 0.5 mg via INTRAVENOUS
  Filled 2021-07-10 (×2): qty 1

## 2021-07-10 MED ORDER — BIOTENE DRY MOUTH MT LIQD: 15.0000 mL | OROMUCOSAL | Status: DC | PRN

## 2021-07-10 NOTE — Progress Notes (Signed)
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  Patient reported symptoms started after he has received chemotherapy.  Patient was seen as STEMI code in the ED due to ST elevation on initila EKG but then later canceled by cardiologist Dr. Burt Knack.  Patient presented with signs of severe sepsis (fever 102, HR 113, RR 40, BP 80/63 which improved after IV fluid boluses.  Patient was started on empiric antibiotics( vancomycin metronidazole and cefepime).  Cardiology is also consulted for elevated troponin and started on heparin drip.  Cardiology recommended cardiac work-up once patient is hemodynamically optimized.  Infectious disease consulted recommended TEE and removal of port.  Chemo-Port removed.  Serum creatinine going up, patient does not want aggressive intervention.  Family has decided comfort care.  Assessment & Plan:   Principal Problem:   Severe sepsis ([place]) Active Problems:   NSTEMI (non-ST elevated myocardial infarction) ([place])   Hypotension   Acute kidney injury superimposed on chronic kidney disease ([place])   Uncontrolled type 2 diabetes mellitus with hyperglycemia, with long-term current use of insulin (HCC)   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HCC)   Macrocytic anemia   Alcohol dependence (HCC)   Tobacco abuse   Gastro-esophageal reflux disease without esophagitis   Transaminitis   Hyperbilirubinemia   Thrombocytopenia (HCC)  Severe sepsis secondary to unknown source: MRSA bacteremia: Patient presented with fever 102.9, tachycardia, tachypnea, high lactic acid which improved with fluid  boluses.   Chest x-ray without any abnormality.  UA: no signs of infection. Continue empiric antibiotics(vancomycin, cefepime, Flagyl). There is a possibility of cholangitis based on CT at [place] that was done yesterday. Cultures grew MRSA Infectious disease consulted recommended IR consult for port removal. Chemo-Port removed.  Tolerated well. Patient was scheduled to have TEE today to evaluate for endocarditis. MRI left knee/femur to evaluate joint or hardware for infection. Antibiotics changed from vancomycin to daptomycin given AKI. Flagyl and cefepime discontinued. Patient not stable for TEE, palliative care consulted to discuss goals of care. Family has decided comfort care. [facility] care notified for hospice eligibility  NSTEMI: Patient reports chest discomfort with some difficulty breathing. Initial labs revealed high-sensitivity troponin trended up to 10,000. Continue heparin for 48 hours. Echocardiogram shows normal inferior wall motion abnormality.   Cardiology consulted and recommended not a candidate for cath given his infection and acute renal failure. TEE canceled.  Care transitioned to comfort care.  Hypotension: Patient initially noted to have low blood pressure which improved with fluid resuscitation. Continue to monitor, continue IV hydration.  AKI on CKD stage IIIb: Baseline serum creatinine 1.4 last checked in [facility].   Serum creatinine on admission 2.3 >2.43 > 3.20>4.50 >5.99 Likely secondary to nausea and vomiting after last chemotherapy. Continue IV hydration, avoid nephrotoxic medications. Nephrology consulted.  Patient is not a candidate for RRT at this time but potentially require in 24 to 48 hours. Care transitioned to [place].  Type 2 diabetes uncontrolled. Last hemoglobin A1c 7.5 Continue sliding scale.  Avoid p.o. diabetic medications.  Metastatic colon cancer: Patient with known metastatic colon cancer s/p resection currently receiving  chemotherapy.   Discussed with oncologist recommended  to hold chemo in the setting of acute infection.  Macrocytic anemia Chronic.  Hemoglobin 9.4 , denies any obvious visible bleeding  Hyperlipidemia: Discontinue atorvastatin.  Hyperbilirubinemia / transaminitis: No acute finding noted on CT abdomen/ pelvis. Continue to monitor  Cholelithiasis: CT scan at [facility] had noted concern for possible choledocholelithiasis Repeat CT abdomen does not report cholelithiasis.  Thrombocytopenia: Chronic but is stable.  No signs of any bleeding.  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  BPH: DC Flomax 0.4 mg daily  GERD: Continue Protonix 40 mg daily  DVT prophylaxis: Heparin gtt. Code Status: Full code Family Communication: No family at bedside Disposition Plan:   Status is: Inpatient Remains inpatient appropriate because: Admitted for severe sepsis secondary to unknown source, cultures positive for MRSA , elevated troponin likely due to sepsis cardiology consulted, recommended patient is not stable for cardiac work-up given sepsis.  Chemo-Port removed.  Care transitioned to comfort care.   Consultants:  Cardiology Infectious diseases  Procedures: CT chest, abdomen and pelvis Antimicrobials:   Anti-infectives (From admission, onward)    Start     Dose/Rate Route Frequency Ordered Stop   07/09/21 1000  vancomycin (VANCOREADY) IVPB 500 mg/100 mL  Status:  Discontinued        500 mg 100 mL/hr over 60 Minutes Intravenous Every 48 hours 07/07/21 0705 07/08/21 0725   07/08/21 2000  DAPTOmycin (CUBICIN) 550 mg in sodium chloride 0.9 % IVPB  Status:  Discontinued        8 mg/kg  71.4 kg 122 mL/hr over 30 Minutes Intravenous Daily 07/08/21 1000 07/08/21 1427   07/08/21 2000  DAPTOmycin (CUBICIN) 550 mg in sodium chloride 0.9 % IVPB  Status:  Discontinued        8 mg/kg  71.4 kg 122 mL/hr over 30 Minutes Intravenous Every 48 hours 07/08/21 1427 07/10/21 1215   07/08/21 0800   ceFEPIme (MAXIPIME) 2 g in sodium chloride 0.9 % 100 mL IVPB  Status:  Discontinued        2 g 200 mL/hr over 30 Minutes Intravenous Every 24 hours 07/07/21 0705 07/08/21 1206   07/08/21 0725  vancomycin variable dose per unstable renal function (pharmacist dosing)  Status:  Discontinued         Does not apply See admin instructions 07/08/21 0725 07/08/21 1000   07/07/21 1500  metroNIDAZOLE (FLAGYL) IVPB 500 mg  Status:  Discontinued        500 mg 100 mL/hr over 60 Minutes Intravenous Every 12 hours 07/07/21 0650 07/08/21 1206   07/07/21 0415  ceFEPIme (MAXIPIME) 2 g in sodium chloride 0.9 % 100 mL IVPB        2 g 200 mL/hr over 30 Minutes Intravenous  Once 07/07/21 0400 07/07/21 0505   07/07/21 0415  metroNIDAZOLE (FLAGYL) IVPB 500 mg        500 mg 100 mL/hr over 60 Minutes Intravenous  Once 07/07/21 0400 07/07/21 0545   07/07/21 0415  vancomycin (VANCOCIN) IVPB 1000 mg/200 mL premix  Status:  Discontinued        1,000 mg 200 mL/hr over 60 Minutes Intravenous  Once 07/07/21 0400 07/07/21 0402   07/07/21 0415  vancomycin (VANCOREADY) IVPB 1250 mg/250 mL        1,250 mg 166.7 mL/hr over 90 Minutes Intravenous  Once 07/07/21 0402 07/07/21 1062        Subjective: Patient was seen and examined at bedside.  Overnight events noted. Patient appears lethargic, opens eyes on calling his name  but otherwise no response. Palliative care consulted to discuss goals of care.  Family decided comfort care.  Objective: Vitals:   07/10/21 0340 07/10/21 0405 07/10/21 0618 07/10/21 0836  BP:  115/90  (!) 89/43  Pulse:  (!) 44  (!) 47  Resp: 16 14  17   Temp:    (!) 97.5 F (36.4 C)  TempSrc:    Oral  SpO2:  98%  96%  Weight:   79.8 kg   Height:        Intake/Output Summary (Last 24 hours) at 07/10/2021 1408 Last data filed at 07/10/2021 0600 Gross per 24 hour  Intake 238.81 ml  Output 100 ml  Net 138.81 ml   Filed Weights   07/08/21 0449 07/09/21 0700 07/10/21 0618  Weight: 71.4 kg 80.1  kg 79.8 kg    Examination:  General exam: Appears lethargic, opens eyes on calling name.  Appears chronically ill looking. Respiratory system: Decreased breath sounds, respiratory effort normal. Cardiovascular system: S1-S2 heard, regular rate and rhythm, no murmur. Gastrointestinal system: Abdomen is soft, non tender, non distended, normal bowel sounds , colostomy noted.   Central nervous system: Arousable, lethargic.Marland Kitchen No focal neurological deficits. Extremities: No edema, no cyanosis, no clubbing. Skin: No rashes, lesions or ulcers Psychiatry: Not assessed.    Data Reviewed: I have personally reviewed following labs and imaging studies  CBC: Recent Labs  Lab 07/07/21 0347 07/08/21 0133 07/09/21 0340 07/10/21 0244  WBC 10.9* 10.2 9.1 7.2  NEUTROABS 9.4*  --   --   --   HGB 9.4* 8.8* 9.2* 9.4*  HCT 27.0* 26.3* 27.0* 28.7*  MCV 98.2 99.6 99.6 103.6*  PLT 79* 51* 85* 992*   Basic Metabolic Panel: Recent Labs  Lab 07/07/21 0347 07/08/21 0133 07/09/21 0340 07/10/21 0244  NA 129* 130* 128* 129*  K 4.0 4.6 4.1 4.8  CL 99 102 98 100  CO2 20* 18* 20* 16*  GLUCOSE 215* 117* 125* 137*  BUN 36* 45* 59* 69*  CREATININE 2.43* 3.20* 4.56* 5.99*  CALCIUM 8.0* 8.0* 8.2* 8.1*  MG 1.5*  --  1.7  --   PHOS  --   --  4.0  --    GFR: Estimated Creatinine Clearance: 10 mL/min (A) (by C-G formula based on SCr of 5.99 mg/dL (H)). Liver Function Tests: Recent Labs  Lab 07/07/21 0347 07/08/21 0133 07/09/21 0340  AST 92* 85* 62*  ALT 33 27 26  ALKPHOS 80 76 79  BILITOT 2.0* 1.7* 1.3*  PROT 6.1* 5.6* 5.9*  ALBUMIN 2.6* 2.2* 2.4*   No results for input(s): LIPASE, AMYLASE in the last 168 hours. No results for input(s): AMMONIA in the last 168 hours. Coagulation Profile: Recent Labs  Lab 07/07/21 0347  INR 2.3*   Cardiac Enzymes: Recent Labs  Lab 07/07/21 1134 07/09/21 0340  CKTOTAL 531* 187   BNP (last 3 results) No results for input(s): PROBNP in the last 8760  hours. HbA1C: No results for input(s): HGBA1C in the last 72 hours.  CBG: Recent Labs  Lab 07/09/21 1204 07/09/21 1542 07/09/21 2208 07/10/21 0803 07/10/21 1134  GLUCAP 141* 143* 134* 133* 150*   Lipid Profile: No results for input(s): CHOL, HDL, LDLCALC, TRIG, CHOLHDL, LDLDIRECT in the last 72 hours. Thyroid Function Tests: No results for input(s): TSH, T4TOTAL, FREET4, T3FREE, THYROIDAB in the last 72 hours. Anemia Panel: No results for input(s): VITAMINB12, FOLATE, FERRITIN, TIBC, IRON, RETICCTPCT in the last 72 hours. Sepsis Labs: Recent Labs  Lab 07/07/21 [phone]  07/07/21 0608 07/07/21 0836 07/07/21 1134 07/07/21 1643 07/07/21 1903  PROCALCITON 27.09  --   --   --   --   --   LATICACIDVEN 2.0*   < > 2.6* 2.8* 3.3* 1.7   < > = values in this interval not displayed.    Recent Results (from the past 240 hour(s))  Blood Culture (routine x 2)     Status: Abnormal (Preliminary result)   Collection Time: 07/07/21  4:00 AM   Specimen: BLOOD RIGHT HAND  Result Value Ref Range Status   Specimen Description BLOOD RIGHT HAND  Final   Special Requests   Final    BOTTLES DRAWN AEROBIC AND ANAEROBIC Blood Culture results may not be optimal due to an inadequate volume of blood received in culture bottles   Culture  Setup Time   Final    GRAM POSITIVE COCCI IN CLUSTERS IN BOTH AEROBIC AND ANAEROBIC BOTTLES CRITICAL RESULT CALLED TO, READ BACK BY AND VERIFIED WITH: PHARMD EDEN BREWINGTON ON 07/07/21 @ 1640 BY DRT    Culture (A)  Final    METHICILLIN RESISTANT STAPHYLOCOCCUS AUREUS Sent to [facility] for further susceptibility testing. Performed at [facility], [place] [address] [place], [place] [address]    Report Status PENDING  Incomplete   Organism ID, Bacteria METHICILLIN RESISTANT STAPHYLOCOCCUS AUREUS  Final      Susceptibility   Methicillin resistant staphylococcus aureus - MIC*    CIPROFLOXACIN >=8 RESISTANT Resistant     ERYTHROMYCIN RESISTANT Resistant      GENTAMICIN <=0.5 SENSITIVE Sensitive     OXACILLIN >=4 RESISTANT Resistant     TETRACYCLINE <=1 SENSITIVE Sensitive     VANCOMYCIN <=0.5 SENSITIVE Sensitive     TRIMETH/SULFA <=10 SENSITIVE Sensitive     CLINDAMYCIN RESISTANT Resistant     RIFAMPIN <=0.5 SENSITIVE Sensitive     Inducible Clindamycin POSITIVE Resistant     * METHICILLIN RESISTANT STAPHYLOCOCCUS AUREUS  Blood Culture ID Panel (Reflexed)     Status: Abnormal   Collection Time: 07/07/21  4:00 AM  Result Value Ref Range Status   Enterococcus faecalis NOT DETECTED NOT DETECTED Final   Enterococcus Faecium NOT DETECTED NOT DETECTED Final   Listeria monocytogenes NOT DETECTED NOT DETECTED Final   Staphylococcus species DETECTED (A) NOT DETECTED Final    Comment: CRITICAL RESULT CALLED TO, READ BACK BY AND VERIFIED WITH: PHARMD EDEN BREWINGTON ON 07/07/21 @ 1640 BY DRT    Staphylococcus aureus (BCID) DETECTED (A) NOT DETECTED Final    Comment: Methicillin (oxacillin)-resistant Staphylococcus aureus (MRSA). MRSA is predictably resistant to beta-lactam antibiotics (except ceftaroline). Preferred therapy is vancomycin unless clinically contraindicated. Patient requires contact precautions if  hospitalized. CRITICAL RESULT CALLED TO, READ BACK BY AND VERIFIED WITH: PHARMD EDEN BREWINGTON ON 07/07/21 @ 1640 BY DRT    Staphylococcus epidermidis NOT DETECTED NOT DETECTED Final   Staphylococcus lugdunensis NOT DETECTED NOT DETECTED Final   Streptococcus species NOT DETECTED NOT DETECTED Final   Streptococcus agalactiae NOT DETECTED NOT DETECTED Final   Streptococcus pneumoniae NOT DETECTED NOT DETECTED Final   Streptococcus pyogenes NOT DETECTED NOT DETECTED Final   A.calcoaceticus-baumannii NOT DETECTED NOT DETECTED Final   Bacteroides fragilis NOT DETECTED NOT DETECTED Final   Enterobacterales NOT DETECTED NOT DETECTED Final   Enterobacter cloacae complex NOT DETECTED NOT DETECTED Final   Escherichia coli NOT DETECTED NOT DETECTED  Final   Klebsiella aerogenes NOT DETECTED NOT DETECTED Final   Klebsiella oxytoca NOT DETECTED NOT DETECTED Final   Klebsiella  pneumoniae NOT DETECTED NOT DETECTED Final   Proteus species NOT DETECTED NOT DETECTED Final   Salmonella species NOT DETECTED NOT DETECTED Final   Serratia marcescens NOT DETECTED NOT DETECTED Final   Haemophilus influenzae NOT DETECTED NOT DETECTED Final   Neisseria meningitidis NOT DETECTED NOT DETECTED Final   Pseudomonas aeruginosa NOT DETECTED NOT DETECTED Final   Stenotrophomonas maltophilia NOT DETECTED NOT DETECTED Final   Candida albicans NOT DETECTED NOT DETECTED Final   Candida auris NOT DETECTED NOT DETECTED Final   Candida glabrata NOT DETECTED NOT DETECTED Final   Candida krusei NOT DETECTED NOT DETECTED Final   Candida parapsilosis NOT DETECTED NOT DETECTED Final   Candida tropicalis NOT DETECTED NOT DETECTED Final   Cryptococcus neoformans/gattii NOT DETECTED NOT DETECTED Final   Meth resistant mecA/C and MREJ DETECTED (A) NOT DETECTED Final    Comment: CRITICAL RESULT CALLED TO, READ BACK BY AND VERIFIED WITH: PHARMD EDEN BREWINGTON ON 07/07/21 @ 1640 BY DRT Performed at [facility], [address], [place], [place] [address]   Min Inhibitory Conc (2 Drugs)     Status: None (Preliminary result)   Collection Time: 07/07/21  4:00 AM  Result Value Ref Range Status   Min Inhibitory Conc (2 Drugs) Preliminary report  Final    Comment: (NOTE) Performed At: [facility] [place], [place] [id number] Rush Farmer MD [id number]    Source ([place]) PENDING  Incomplete  MIC Results (2 Drugs)     Status: None   Collection Time: 07/07/21  4:00 AM  Result Value Ref Range Status   RESULT 5 MIC (RESULT 1)                Base Name: Staphylococcus aureus  Final    Comment: (NOTE) Identification performed by account, not confirmed by this laboratory. Performed At: [facility] [place], [place]  [id number] Rush Farmer MD VZ:[id number]   Resp Panel by RT-PCR (Flu A&B, Covid) Nasopharyngeal Swab     Status: None   Collection Time: 07/07/21  4:06 AM   Specimen: Nasopharyngeal Swab; Nasopharyngeal(NP) swabs in vial transport medium  Result Value Ref Range Status   SARS Coronavirus 2 by RT PCR NEGATIVE NEGATIVE Final    Comment: (NOTE) SARS-CoV-2 target nucleic acids are NOT DETECTED.  The SARS-CoV-2 RNA is generally detectable in upper respiratory specimens during the acute phase of infection. The lowest concentration of SARS-CoV-2 viral copies this assay can detect is 138 copies/mL. A negative result does not preclude SARS-Cov-2 infection and should not be used as the sole basis for treatment or other patient management decisions. A negative result may occur with  improper specimen collection/handling, submission of specimen other than nasopharyngeal swab, presence of viral mutation(s) within the areas targeted by this assay, and inadequate number of viral copies(<138 copies/mL). A negative result must be combined with clinical observations, patient history, and epidemiological information. The expected result is Negative.  Fact Sheet for Patients:  EntrepreneurPulse.com.au  Fact Sheet for Healthcare Providers:  IncredibleEmployment.be  This test is no t yet approved or cleared by the [place] FDA and  has been authorized for detection and/or diagnosis of SARS-CoV-2 by FDA under an Emergency Use Authorization (EUA). This EUA will remain  in effect (meaning this test can be used) for the duration of the COVID-19 declaration under Section 564(b)(1) of the Act, 21 U.S.C.section 360bbb-3(b)(1), unless the authorization is terminated  or revoked sooner.       Influenza A  by PCR NEGATIVE NEGATIVE Final   Influenza B by PCR NEGATIVE NEGATIVE Final    Comment: (NOTE) The Xpert Xpress SARS-CoV-2/FLU/RSV plus assay is intended as an  aid in the diagnosis of influenza from Nasopharyngeal swab specimens and should not be used as a sole basis for treatment. Nasal washings and aspirates are unacceptable for Xpert Xpress SARS-CoV-2/FLU/RSV testing.  Fact Sheet for Patients: EntrepreneurPulse.com.au  Fact Sheet for Healthcare Providers: IncredibleEmployment.be  This test is not yet approved or cleared by the [place] FDA and has been authorized for detection and/or diagnosis of SARS-CoV-2 by FDA under an Emergency Use Authorization (EUA). This EUA will remain in effect (meaning this test can be used) for the duration of the COVID-19 declaration under Section 564(b)(1) of the Act, 21 U.S.C. section 360bbb-3(b)(1), unless the authorization is terminated or revoked.  Performed at [facility], [place] [address], [place], [place] [address]   Blood Culture (routine x 2)     Status: Abnormal (Preliminary result)   Collection Time: 07/07/21  4:15 AM   Specimen: BLOOD  Result Value Ref Range Status   Specimen Description BLOOD LEFT ANTECUBITAL  Final   Special Requests   Final    BOTTLES DRAWN AEROBIC AND ANAEROBIC Blood Culture adequate volume   Culture  Setup Time   Final    GRAM POSITIVE COCCI IN CLUSTERS IN BOTH AEROBIC AND ANAEROBIC BOTTLES CRITICAL VALUE NOTED.  VALUE IS CONSISTENT WITH PREVIOUSLY REPORTED AND CALLED VALUE. CRITICAL RESULT CALLED TO, READ BACK BY AND VERIFIED WITH: PHARMD ERicki Miller [id number] @1729  FH      Culture (A)  Final    STAPHYLOCOCCUS AUREUS SUSCEPTIBILITIES PERFORMED ON PREVIOUS CULTURE WITHIN THE LAST 5 DAYS. Performed at [facility], [place] [address] [place], [place] [address]    Report Status PENDING  Incomplete  Urine Culture     Status: None   Collection Time: 07/07/21  7:42 AM   Specimen: In/Out Cath Urine  Result Value Ref Range Status   Specimen Description IN/OUT CATH URINE  Final   Special Requests NONE  Final   Culture    Final    NO GROWTH Performed at [facility], [place] [address] [place], [place] [address]    Report Status 07/08/2021 FINAL  Final  Culture, blood (Routine X 2) w Reflex to ID Panel     Status: None (Preliminary result)   Collection Time: 07/08/21  9:25 AM   Specimen: BLOOD LEFT HAND  Result Value Ref Range Status   Specimen Description BLOOD LEFT HAND  Final   Special Requests   Final    BOTTLES DRAWN AEROBIC AND ANAEROBIC Blood Culture adequate volume   Culture   Final    NO GROWTH 2 DAYS Performed at [facility], [place] [address] [place], [place] [address]    Report Status PENDING  Incomplete  Culture, blood (Routine X 2) w Reflex to ID Panel     Status: None (Preliminary result)   Collection Time: 07/08/21  9:25 AM   Specimen: BLOOD LEFT HAND  Result Value Ref Range Status   Specimen Description BLOOD LEFT HAND  Final   Special Requests   Final    BOTTLES DRAWN AEROBIC AND ANAEROBIC Blood Culture adequate volume   Culture   Final    NO GROWTH 2 DAYS Performed at [facility], [place] [address] [place], [place] [address]    Report Status PENDING  Incomplete  MRSA Next Gen  by PCR, Nasal     Status: Abnormal   Collection Time: 07/08/21  3:07 PM   Specimen: Nasal Mucosa; Nasal Swab  Result Value Ref Range Status   MRSA by PCR Next Gen DETECTED (A) NOT DETECTED Final    Comment: RESULT CALLED TO, READ BACK BY AND VERIFIED WITH: L MITCHELL,RN@2132  07/08/21 [place] (NOTE) The GeneXpert MRSA Assay (FDA approved for NASAL specimens only), is one component of a comprehensive MRSA colonization surveillance program. It is not intended to diagnose MRSA infection nor to guide or monitor treatment for MRSA infections. Test performance is not FDA approved in patients less than 65 years old. Performed at [facility], [place] [address], [place], [place] [address]          Radiology Studies: US RENAL  Result Date: 07/09/2021 CLINICAL DATA:  Acute renal failure.  EXAM: RENAL / URINARY TRACT ULTRASOUND COMPLETE COMPARISON:  CT chest, abdomen, and pelvis 07/07/2021 FINDINGS: Decreased image resolution due to patient body habitus. Right Kidney: Renal measurements: 9.7 x 5.2 x 5.5 cm = volume: 146 mL. Echogenicity within normal limits. No mass or hydronephrosis visualized. Left Kidney: Renal measurements: 9.4 x 4.9 x 5.7 cm = volume: 136 mL. Echogenicity within normal limits. No mass or hydronephrosis visualized. Bladder: Appears normal for degree of bladder distention. Other: Bilateral pleural effusions. Splenomegaly with length of 13.5 cm and estimated volume of 456 cc. 2.6 cm mass between the liver and right kidney corresponding to the adrenal lesion on CT. IMPRESSION: 1. No hydronephrosis. 2. Bilateral pleural effusions. 3. Splenomegaly. 4. 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 Electronically Signed   By: Logan Bores M.D.   On: 07/09/2021 14:48   MR FEMUR LEFT WO CONTRAST  Result Date: 07/09/2021 CLINICAL DATA:  History of left hip ORIF. Evaluate soft tissue infection. EXAM: MR OF THE LEFT FEMUR WITHOUT CONTRAST TECHNIQUE: Multiplanar, multisequence MR imaging of the left femur was performed. No intravenous contrast was administered. COMPARISON:  None Available. FINDINGS: Bones/Joint/Cartilage No acute fracture or dislocation. Prior ORIF of a healed intertrochanteric fracture with susceptibility artifact partially obscuring the adjacent soft tissue and osseous structures. Prior ORIF of a left femoral diaphysis fracture transfixed with a lateral sideplate and multiple interlocking screws with susceptibility artifact partially obscuring the adjacent soft tissue and osseous structures. No aggressive osseous lesion. Normal alignment. No joint effusion. No marrow signal abnormality. Partial-thickness cartilage loss of the left femoral head and acetabulum. Right total hip arthroplasty partially visualized. No periarticular fluid collection or osteolysis.  Muscles and Tendons Mild edema in the tensor fascia lata and vastus intermedius muscle adjacent to the hip concerning for mild myositis with surrounding soft tissue edema between the muscle planes. Mild muscle edema in the distal vastus lateralis muscle concerning for myositis versus muscle strain. No intramuscular fluid collection or hematoma. Soft tissue Generalized anasarca. No soft tissue mass. No fluid collection or hematoma. IMPRESSION: 1. No evidence of septic arthritis of the left hip. Mild-moderate osteoarthritis of the left hip. 2. Mild edema in the tensor fascia lata and vastus intermedius muscle adjacent to the hip concerning for mild myositis with surrounding soft tissue edema between the muscle planes. Mild muscle edema in the distal vastus lateralis muscle concerning for myositis versus muscle strain. No fluid collection to suggest an abscess. 3. Evaluation for infection related to the hardware is extremely limited given the degree of artifact resulting from the metallic hardware. Electronically Signed   By: Kathreen Devoid M.D.   On:  07/09/2021 08:34   MR KNEE LEFT WO CONTRAST  Result Date: 07/09/2021 CLINICAL DATA:  History of knee replacement. Evaluate for infection. EXAM: MRI OF THE LEFT KNEE WITHOUT CONTRAST TECHNIQUE: Multiplanar, multisequence MR imaging of the knee was performed. No intravenous contrast was administered. COMPARISON:  None Available. FINDINGS: Bones/Joint/Cartilage No acute fracture or dislocation. 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 No periarticular fluid collection, hematoma or osteolysis. No evidence of hardware failure or complication within limitations of the artifact. Normal alignment. No joint effusion. No marrow signal abnormality. Muscles and Tendons Soft tissue edema involving the distal aspect of the vastus lateralis muscle concerning for muscle strain versus  myositis. No intramuscular fluid collection or hematoma. Quadriceps tendon and patellar tendon are intact. Soft tissue No fluid collection or hematoma. No soft tissue mass. Generalized anasarca. IMPRESSION: 1. 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 No periarticular fluid collection, hematoma or osteolysis. No evidence of hardware failure or complication. 2. Soft tissue edema involving the distal aspect of the vastus lateralis muscle concerning for muscle strain versus myositis. Electronically Signed   By: Kathreen Devoid M.D.   On: 07/09/2021 08:37   IR REMOVAL TUN ACCESS W/ PORT W/O FL MOD SED  Result Date: 07/09/2021 INDICATION: Infection of the port site EXAM: REMOVAL RIGHT IJ VEIN PORT-A-CATH MEDICATIONS: None ANESTHESIA/SEDATION: Moderate (conscious) sedation was not employed during this procedure. Dilaudid 1 mg was administered intravenously by the radiology nurse. The patient's level of consciousness and vital signs were monitored continuously by radiology nursing throughout the procedure under my direct supervision. FLUOROSCOPY: None COMPLICATIONS: None immediate. PROCEDURE: Informed written consent was obtained from the patient after a thorough discussion of the procedural risks, benefits and alternatives. All questions were addressed. Maximal Sterile Barrier Technique was utilized including caps, mask, sterile gowns, sterile gloves, sterile drape, hand hygiene and skin antiseptic. A timeout was performed prior to the initiation of the procedure. The right chest was prepped and draped in a sterile fashion. Lidocaine was utilized for local anesthesia. An incision was made over the previously healed surgical incision. Utilizing blunt dissection, the port catheter and reservoir were removed from the underlying subcutaneous tissue in their entirety. Securing sutures were also removed. The pocket was irrigated  with a copious amount of sterile normal saline. The pocket was closed with interrupted 3-0 Vicryl stitches. The subcutaneous tissue was closed with 3-0 Vicryl interrupted subcutaneous stitches. Dermabond was applied. IMPRESSION: Successful right IJ vein Port-A-Cath explant. Electronically Signed   By: Frazier Richards M.D.   On: 07/09/2021 15:47    Scheduled Meds:   HYDROmorphone (DILAUDID) injection  0.5 mg Intravenous Q6H   polyethylene glycol  17 g Oral Daily   Continuous Infusions:     LOS: 3 days    Time spent: 35 mins    Halei Hanover, MD [facility]   If 7PM-7AM, please contact night-coverage

## 2021-07-10 NOTE — Progress Notes (Signed)
Progress Note  Patient Name: Corey Gonzalez Date of Encounter: 07/10/2021  [facility] Cardiologist: None   Subjective   CODE STATUS was changed yesterday.  Inpatient Medications    Scheduled Meds:  vitamin C  500 mg Oral Daily   atorvastatin  40 mg Oral QHS   Chlorhexidine Gluconate Cloth  6 each Topical Q0600   docusate sodium  100 mg Oral BID   DULoxetine  20 mg Oral Daily   ferrous sulfate  325 mg Oral Q breakfast   insulin aspart  0-6 Units Subcutaneous TID WC   insulin aspart  3 Units Subcutaneous TID WC   melatonin  3 mg Oral QHS   mupirocin ointment  1 application. Nasal BID   pantoprazole  40 mg Oral Daily   polyethylene glycol  17 g Oral Daily   sodium chloride flush  3 mL Intravenous Q12H   tamsulosin  0.4 mg Oral Daily   Continuous Infusions:  sodium chloride 10 mL/hr at 07/08/21 1804   sodium chloride 20 mL/hr at 07/09/21 2209   DAPTOmycin (CUBICIN)  IV 550 mg (07/08/21 2045)   heparin 450 Units/hr (07/09/21 1145)   PRN Meds: sodium chloride, acetaminophen **OR** acetaminophen, albuterol, bisacodyl, HYDROcodone-acetaminophen, magic mouthwash w/lidocaine, ondansetron **OR** ondansetron (ZOFRAN) IV, senna-docusate   Vital Signs    Vitals:   07/10/21 0340 07/10/21 0405 07/10/21 0618 07/10/21 0836  BP:  115/90  (!) 89/43  Pulse:  (!) 44  (!) 47  Resp: 16 14  17   Temp:    (!) 97.5 F (36.4 C)  TempSrc:    Oral  SpO2:  98%  96%  Weight:   79.8 kg   Height:        Intake/Output Summary (Last 24 hours) at 07/10/2021 1023 Last data filed at 07/10/2021 0600 Gross per 24 hour  Intake 238.81 ml  Output 100 ml  Net 138.81 ml      07/10/2021    6:18 AM 07/09/2021    7:00 AM 07/08/2021    4:49 AM  Last 3 Weights  Weight (lbs) 176 lb 176 lb 9.6 oz 157 lb 6.5 oz  Weight (kg) 79.833 kg 80.105 kg 71.4 kg      Telemetry    Normal sinus rhythm- Personally Reviewed  ECG  None recent  Physical Exam   GEN: No acute distress.   Neck: No JVD Cardiac:  RRR, no murmurs, rubs, or gallops.  Respiratory: Clear to auscultation bilaterally. GI: Soft, nontender, non-distended  MS: Trace lower extremity edema; No deformity. Neuro:  Nonfocal  Psych: Flat affect   Labs    High Sensitivity Troponin:   Recent Labs  Lab 07/07/21 0347 07/07/21 0608 07/07/21 1134  TROPONINIHS 8,353* 7,977* 10,105*     Chemistry Recent Labs  Lab 07/07/21 0347 07/08/21 0133 07/09/21 0340 07/10/21 0244  NA 129* 130* 128* 129*  K 4.0 4.6 4.1 4.8  CL 99 102 98 100  CO2 20* 18* 20* 16*  GLUCOSE 215* 117* 125* 137*  BUN 36* 45* 59* 69*  CREATININE 2.43* 3.20* 4.56* 5.99*  CALCIUM 8.0* 8.0* 8.2* 8.1*  MG 1.5*  --  1.7  --   PROT 6.1* 5.6* 5.9*  --   ALBUMIN 2.6* 2.2* 2.4*  --   AST 92* 85* 62*  --   ALT 33 27 26  --   ALKPHOS 80 76 79  --   BILITOT 2.0* 1.7* 1.3*  --   GFRNONAA 26* 19* 12* 9*  ANIONGAP 10  10 10 13     Lipids No results for input(s): CHOL, TRIG, HDL, LABVLDL, LDLCALC, CHOLHDL in the last 168 hours.  Hematology Recent Labs  Lab 07/08/21 0133 07/09/21 0340 07/10/21 0244  WBC 10.2 9.1 7.2  RBC 2.64* 2.71* 2.77*  HGB 8.8* 9.2* 9.4*  HCT 26.3* 27.0* 28.7*  MCV 99.6 99.6 103.6*  MCH 33.3 33.9 33.9  MCHC 33.5 34.1 32.8  RDW 20.7* 21.2* 21.7*  PLT 51* 85* 107*   Thyroid No results for input(s): TSH, FREET4 in the last 168 hours.  BNPNo results for input(s): BNP, PROBNP in the last 168 hours.  DDimer No results for input(s): DDIMER in the last 168 hours.   Radiology    US RENAL  Result Date: 07/09/2021 CLINICAL DATA:  Acute renal failure. EXAM: RENAL / URINARY TRACT ULTRASOUND COMPLETE COMPARISON:  CT chest, abdomen, and pelvis 07/07/2021 FINDINGS: Decreased image resolution due to patient body habitus. Right Kidney: Renal measurements: 9.7 x 5.2 x 5.5 cm = volume: 146 mL. Echogenicity within normal limits. No mass or hydronephrosis visualized. Left Kidney: Renal measurements: 9.4 x 4.9 x 5.7 cm = volume: 136 mL. Echogenicity  within normal limits. No mass or hydronephrosis visualized. Bladder: Appears normal for degree of bladder distention. Other: Bilateral pleural effusions. Splenomegaly with length of 13.5 cm and estimated volume of 456 cc. 2.6 cm mass between the liver and right kidney corresponding to the adrenal lesion on CT. IMPRESSION: 1. No hydronephrosis. 2. Bilateral pleural effusions. 3. Splenomegaly. 4. 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 Electronically Signed   By: Logan Bores M.D.   On: 07/09/2021 14:48   MR FEMUR LEFT WO CONTRAST  Result Date: 07/09/2021 CLINICAL DATA:  History of left hip ORIF. Evaluate soft tissue infection. EXAM: MR OF THE LEFT FEMUR WITHOUT CONTRAST TECHNIQUE: Multiplanar, multisequence MR imaging of the left femur was performed. No intravenous contrast was administered. COMPARISON:  None Available. FINDINGS: Bones/Joint/Cartilage No acute fracture or dislocation. Prior ORIF of a healed intertrochanteric fracture with susceptibility artifact partially obscuring the adjacent soft tissue and osseous structures. Prior ORIF of a left femoral diaphysis fracture transfixed with a lateral sideplate and multiple interlocking screws with susceptibility artifact partially obscuring the adjacent soft tissue and osseous structures. No aggressive osseous lesion. Normal alignment. No joint effusion. No marrow signal abnormality. Partial-thickness cartilage loss of the left femoral head and acetabulum. Right total hip arthroplasty partially visualized. No periarticular fluid collection or osteolysis. Muscles and Tendons Mild edema in the tensor fascia lata and vastus intermedius muscle adjacent to the hip concerning for mild myositis with surrounding soft tissue edema between the muscle planes. Mild muscle edema in the distal vastus lateralis muscle concerning for myositis versus muscle strain. No intramuscular fluid collection or hematoma. Soft tissue Generalized anasarca. No soft tissue mass.  No fluid collection or hematoma. IMPRESSION: 1. No evidence of septic arthritis of the left hip. Mild-moderate osteoarthritis of the left hip. 2. Mild edema in the tensor fascia lata and vastus intermedius muscle adjacent to the hip concerning for mild myositis with surrounding soft tissue edema between the muscle planes. Mild muscle edema in the distal vastus lateralis muscle concerning for myositis versus muscle strain. No fluid collection to suggest an abscess. 3. Evaluation for infection related to the hardware is extremely limited given the degree of artifact resulting from the metallic hardware. Electronically Signed   By: Kathreen Devoid M.D.   On: 07/09/2021 08:34   MR KNEE LEFT WO CONTRAST  Result  Date: 07/09/2021 CLINICAL DATA:  History of knee replacement. Evaluate for infection. EXAM: MRI OF THE LEFT KNEE WITHOUT CONTRAST TECHNIQUE: Multiplanar, multisequence MR imaging of the knee was performed. No intravenous contrast was administered. COMPARISON:  None Available. FINDINGS: Bones/Joint/Cartilage No acute fracture or dislocation. 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 No periarticular fluid collection, hematoma or osteolysis. No evidence of hardware failure or complication within limitations of the artifact. Normal alignment. No joint effusion. No marrow signal abnormality. Muscles and Tendons Soft tissue edema involving the distal aspect of the vastus lateralis muscle concerning for muscle strain versus myositis. No intramuscular fluid collection or hematoma. Quadriceps tendon and patellar tendon are intact. Soft tissue No fluid collection or hematoma. No soft tissue mass. Generalized anasarca. IMPRESSION: 1. 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 No periarticular fluid collection, hematoma or osteolysis.  No evidence of hardware failure or complication. 2. Soft tissue edema involving the distal aspect of the vastus lateralis muscle concerning for muscle strain versus myositis. Electronically Signed   By: Kathreen Devoid M.D.   On: 07/09/2021 08:37   IR REMOVAL TUN ACCESS W/ PORT W/O FL MOD SED  Result Date: 07/09/2021 INDICATION: Infection of the port site EXAM: REMOVAL RIGHT IJ VEIN PORT-A-CATH MEDICATIONS: None ANESTHESIA/SEDATION: Moderate (conscious) sedation was not employed during this procedure. Dilaudid 1 mg was administered intravenously by the radiology nurse. The patient's level of consciousness and vital signs were monitored continuously by radiology nursing throughout the procedure under my direct supervision. FLUOROSCOPY: None COMPLICATIONS: None immediate. PROCEDURE: Informed written consent was obtained from the patient after a thorough discussion of the procedural risks, benefits and alternatives. All questions were addressed. Maximal Sterile Barrier Technique was utilized including caps, mask, sterile gowns, sterile gloves, sterile drape, hand hygiene and skin antiseptic. A timeout was performed prior to the initiation of the procedure. The right chest was prepped and draped in a sterile fashion. Lidocaine was utilized for local anesthesia. An incision was made over the previously healed surgical incision. Utilizing blunt dissection, the port catheter and reservoir were removed from the underlying subcutaneous tissue in their entirety. Securing sutures were also removed. The pocket was irrigated with a copious amount of sterile normal saline. The pocket was closed with interrupted 3-0 Vicryl stitches. The subcutaneous tissue was closed with 3-0 Vicryl interrupted subcutaneous stitches. Dermabond was applied. IMPRESSION: Successful right IJ vein Port-A-Cath explant. Electronically Signed   By: Frazier Richards M.D.   On: 07/09/2021 15:47    Cardiac Studies   EF 45 to 50%  Patient Profile     81  y.o. male with sepsis, elevated troponin, demand ischemia.  Intermittent heart block.     Bacteremia noted.  There was a plan for TEE.  Given his worsening clinical condition and worsening renal failure, I do not think he would be a candidate for anything but IV antibiotic therapy.  Even if there was an abscess from endocarditis noted in his aorta, he would not be a surgical candidate.  Therefore , I am not sure if a TEE changes management.  We will hold off for now.  If there is strong feelings that a TEE would in fact change management, we can reconsider in the future.  He is not a candidate for heart cath.  Continue supportive care.  For questions or updates, please contact [place] Please consult [email or web address] for contact  info under        Signed, Larae Grooms, MD  07/10/2021, 10:23 AM

## 2021-07-10 NOTE — Progress Notes (Signed)
Notified by CCMD that pt briefly went into a 3rd degree HB. Paged Dr. Marcelle Smiling with cardiology about episode. Pt is no back SB with 1 degree HB. Pt is currently resting in bed VS stable at this time. Pt is being monitored closely.

## 2021-07-10 NOTE — Discharge Instructions (Signed)
Patient is being discharged to [facility] hospice facility.

## 2021-07-10 NOTE — Progress Notes (Signed)
ANTICOAGULATION CONSULT NOTE -  Pharmacy Consult for Heparin Indication: chest pain/ACS and atrial fibrillation   No Known Allergies  Patient Measurements: Height: 5\' 7"  (170.2 cm) Weight: 79.8 kg (176 lb) IBW/kg (Calculated) : 66.1  Vital Signs: Temp: 98.3 F (36.8 C) (05/25 2050) Temp Source: Oral (05/25 2050) BP: 115/90 (05/26 0405) Pulse Rate: 44 (05/26 0405)  Labs: Recent Labs    07/07/21 1134 07/07/21 1338 07/08/21 0133 07/08/21 0925 07/08/21 1800 07/09/21 0340 07/10/21 0244  HGB  --    < > 8.8*  --   --  9.2* 9.4*  HCT  --   --  26.3*  --   --  27.0* 28.7*  PLT  --   --  51*  --   --  85* 107*  APTT  --    < >  --    < > 91* 104* 87*  HEPARINUNFRC  --   --  >1.10*  --   --  >1.10* >1.10*  CREATININE  --   --  3.20*  --   --  4.56* 5.99*  CKTOTAL 531*  --   --   --   --  187  --   TROPONINIHS 10,105*  --   --   --   --   --   --    < > = values in this interval not displayed.     Estimated Creatinine Clearance: 10 mL/min (A) (by C-G formula based on SCr of 5.99 mg/dL (H)).   Assessment: 81 y.o. male admitted with chest pain, h/o Afib and Eliquis on hold (last dose 5/22 1700), on heparin.    -Aptt 87 sec (on heparin 450 units/hr) this morning    Goal of Therapy:  aPTT 66-102 sec Heparin level 0.3-0.7 units/ml Monitor platelets by anticoagulation protocol: Yes   Plan:  Continue heparin 450 units/hr Daily heparin level, aPTT and CBC  Thank you for allowing pharmacy to be a part of this patient's care.  Hildred Laser, PharmD Clinical Pharmacist **Pharmacist phone directory can now be found on [email or web address] ([email or web address]).  Listed under [place].

## 2021-07-10 NOTE — Progress Notes (Addendum)
Report given to Newman Nickels at [facility]. [phone]. DNR, discharge summary and medical necessity form ready for transfer. Margit Hanks, visitor earlier today and daughter of patients significant other, notified of patient transfer. Unable to reach her or leave a message Patients sister will be notified of transfer by Hospice. [facility] called for transfer. Paperwork completed with the help of SW and CM.

## 2021-07-10 NOTE — Progress Notes (Addendum)
Follow up page to cardiology paged about pt heart rate being. No new orders received and pt remains asymptomatic at this time. Informed by Dr. Marcelle Smiling to continue to monitor pt for symptoms. Pt being monitored closely.

## 2021-07-10 NOTE — Consult Note (Signed)
Palliative Medicine Inpatient Consult Note  Consulting Provider: Shawna Clamp, MD  Reason for consult:   [name] Palliative Medicine Consult  Reason for Consult? Patient wants comfort care.   07/10/2021  HPI:  Per intake H&P --> Corey Gonzalez is a 81 y.o. 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  He is followed by oncology at the [facility] and had just recently started his fourth cycle of  capecitabine + bevacizumab on 5/18.  Following day patient reported acute onset of chills, malaise, generalized body aches(mostly upper back and across chest), nausea, vomiting, and some increased difficulty breathing.  He reports having a chronic productive cough with grayish sputum related to his history of smoking that he states seem to be less productive.  Denies coughing up any blood, significant leg swelling, abdominal pain, or change in ostomy output.  He reports that he has not had similar symptoms like this previously in the past after taking chemotherapy.  He had a CT abdomen and pelvis with contrast performed at the [facility] yesterday due to him symptoms that showed smaller pulmonary nodule since prior CT and PET/CT, no new growing suspicious lesions of the chest, abdomen, pelvis, and potential choledocholithiasis with mild increased biliary dilation.  Palliative care was asked to get involved to help with goals of care conversations as patients expresses the desire to be made comfortable.   Clinical Assessment/Goals of Care:  *Please note that this is a verbal dictation therefore any spelling or grammatical errors are due to the "[place]"  system interpretation.  I have reviewed medical records including EPIC notes, labs and imaging, received report from bedside RN, assessed the patient who was awake but quite disoriented.    I called patients sister, Corey Gonzalez to further discuss diagnosis prognosis, GOC, EOL wishes, disposition and options.   I introduced Palliative Medicine as specialized medical care for people living with serious illness. It focuses on providing relief from the symptoms and stress of a serious illness. The goal is to improve quality of life for both the patient and the family.  Medical History Review and Understanding:  A review of Corey Gonzalez's past medical history inclusive of his atrial fibrillation, diabetes, metastatic colon cancer for which she is still receiving chemotherapy was had.  Social History:  Corey Gonzalez is from [place], [place].  He was married years ago though had a divorce per his sister likely in the setting of alcohol abuse.  He has had a longtime girlfriend, Corey Gonzalez who he has been with for the past 31 years. Corey Gonzalez never had children.  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  Per his sister his greatest recreational activity was that of drinking unfortunately due to family history he was unable to stop this addictive habit.  Functional and Nutritional State:  Corey Gonzalez lives at [facility] home and requires help with all basic activities of daily living.  His appetite has been variable.  Advance Directives:  A detailed discussion was had today regarding advanced directives.  Patient does not have these completed though he only has 1 living relative his sister, Corey Gonzalez who lives in [place].  By default Corey Gonzalez is a Ambulance person.  Code Status:  Encouraged  patient/family to consider DNR/DNI status understanding evidenced based poor outcomes in similar hospitalized patient, as the cause of arrest is likely associated with advanced chronic/terminal illness  rather than an easily reversible acute cardio-pulmonary event. I explained that DNR/DNI does not change the medical plan and it only comes into effect after a person has arrested (died).  It is a protective measure to keep [place] from harming the patient in their last moments of life.   Patient's sister is agreeable to a DO NOT RESUSCITATE CODE STATUS.  She shares that her brother had been clear throughout his life that he would not want these efforts made  Discussion:  I shared with Corey Gonzalez the concerns regarding Corey Gonzalez's present hospital stay as he is now experiencing multisystem organ dysfunction.  Reviewed that patient has severe sepsis suspected to be related to his Chemo-Port.  Discussed that he has suffered an NSTEMI since hospitalization and has been in heart block.  Reviewed that he has worsening kidney function.  Discussed that at this point in time per review with nephrology he is not someone who is a good candidate for hemodialysis given his metastatic colon cancer.  Patient's Sister Corey Gonzalez was obviously very upset by this information though she shares that she had spoken with both the nephrologist and primary care team and is understanding of these realities.  We reviewed that prior to palliative's involvement he had made multiple statements to nurses and providers that he wanted to be made comfortable.  I shared with Corey Gonzalez that unfortunately his mental state was not 1 whereby I could have this conversation today.  Corey Gonzalez expresses that she knows her brother and agrees that he would not want aggressive measures to sustain life.  She shares that he has been "suffering" for a long time.  We talked about transition to comfort measures in house and what that would entail inclusive of medications to control pain, dyspnea, agitation, nausea, itching, and hiccups.    We discussed stopping all uneccessary measures such as cardiac monitoring, blood draws, needle sticks, and frequent vital signs.    Utilized reflective listening throughout our time together.  Corey Gonzalez is in agreement with this.  She shares that she would like for Addis to go to a hospice home in [place].  Discussed the importance of continued conversation with family and their  medical providers regarding overall plan of care and treatment options, ensuring decisions are within the context of the patients values and GOCs.  Decision Maker: Corey Gonzalez,Corey Gonzalez Sister     [phone] Only living sister   SUMMARY OF RECOMMENDATIONS   DNAR/DNI   Comfort Care  Will add low-dose Dilaudid around-the-clock given patient's abdominal pain  Additional comfort meds per [facility]  Unrestricted visitation -patient's sister plans on visiting from [place] transitions of care team making referral to [facility] hospice home  Ongoing palliative support until discharge  Code Status/Advance Care Planning: DNAR/DNI  Palliative Prophylaxis:  Aspiration, Bowel Regimen, Delirium Protocol, Frequent Pain Assessment, Oral Care, Palliative Wound Care, and Turn Reposition  Additional Recommendations (Limitations, Scope, Preferences): Avoid Hospitalization, comfort care  Psycho-social/Spiritual:  Desire for further Chaplaincy support: No Additional Recommendations: Education on metastatic disease   Prognosis: Given patient's worsening kidney function I suspect he will have days to weeks to live.  Discharge Planning: Discharge plan uncertain though patient will be most optimized at a hospice home whereby symptom relieving efforts could be made earnestly.  Vitals:   07/10/21 0405 07/10/21 0836  BP: 115/90 (!) 89/43  Pulse: Marland Kitchen)  44 (!) 47  Resp: 14 17  Temp:  (!) 97.5 F (36.4 C)  SpO2: 98% 96%    Intake/Output Summary (Last 24 hours) at 07/10/2021 1045 Last data filed at 07/10/2021 0600 Gross per 24 hour  Intake 238.81 ml  Output 100 ml  Net 138.81 ml   Last Weight  Most recent update: 07/10/2021  6:18 AM     Weight  79.8 kg (176 lb)            Gen: Elderly Caucasian male in no acute distress HEENT: Dry mucous membranes CV: Irregular rate and rhythm PULM: On room air ABD: Slight distention with tenderness on deep palpation EXT: No edema Neuro: Disoriented continues to share with me he does not know what is going on  PPS: 10%   This conversation/these recommendations were discussed with patient primary care team, Dr. Dwyane Dee   Billing based on MDM: High  Problems Addressed: One acute or chronic illness or injury that poses a threat to life or bodily function  Amount and/or Complexity of Data: Category 3:Discussion of management or test interpretation with external physician/other qualified health care professional/appropriate source (not separately reported)  Risks: Decision not to resuscitate or to de-escalate care because of poor prognosis ______________________________________________________ [place] Team Team Cell Phone: [phone] Please utilize secure chat with additional questions, if there is no response within 30 minutes please call the above phone number  Palliative Medicine Team providers are available by phone from 7am to 7pm daily and can be reached through the team cell phone.  Should this patient require assistance outside of these hours, please call the patient's attending physician.

## 2021-07-10 NOTE — Progress Notes (Addendum)
Pt's heart rate dropping as low and upper 40's to low 50's cardiology paged and also notified Dr. Hal Hope about this issue. Pt is post port removal today due to possible infection.   Dr. Hal Hope also notified about pt verbalizing change of code status while pt is no longer crying and distraught pt stated does not want live any longer. After a long talk with pt with charge nurse Freddy Finner, RN and with MD pt decided to become a DNR. MD also talked with pt to confirm decision. Pt fully informed in detail of the status of DNR and verbalizes to change code status.    Pt's VS remain stable at this time with low heart rate issues at this time. Awaiting call back from cardiology on pt.

## 2021-07-10 NOTE — Progress Notes (Addendum)
Patient ID: Corey Gonzalez, male   DOB: January 13, 1941, 81 y.o.   MRN: [id number] [facility] Progress Note   Assessment/ Plan:   1. Acute kidney Injury on chronic kidney disease stage IIIa: Baseline creatinine ranging 1.2-1.5 appears to have suffered multifactorial renal injury with initial prerenal insult nausea/vomiting following chemotherapy and complicated by MSSA bacteremia/sepsis likely evolving into ATN.  He is now status post removal of Port-A-Cath and is on antimicrobial coverage with daptomycin for MRSA bacteremia.  Renal function worse overnight with decreasing urine output but without any acute electrolyte abnormality.  Based on the overall clinical picture and his significant underlying comorbidities/limitations, he would not be a candidate for chronic renal replacement therapy.  I appreciate the palliative care service seeing him to help unify goals of care. 2.  Severe sepsis/MRSA bacteremia: Suspected to be catheter related (Port-A-Cath) with source control undertaken by removing catheter-antimicrobial coverage now monotherapy with daptomycin.  Additional evaluation limited by changes in worsening clinical status. 3.  Hyponatremia: Secondary to acute kidney injury/impaired free water handling in this patient with a history of congestive heart failure/cirrhosis/metastatic colon cancer. 4.  Hypotension: Secondary to severe sepsis and improved with intravenous fluids, holding antihypertensive therapy and limiting fluid overload with anuric acute kidney injury.  Subjective:   Yesterday night, spoke to hospitalist and is now DNR status.  Confused during our interaction this morning.   Objective:   BP (!) 89/43 (BP Location: Left Arm)   Pulse (!) 47   Temp (!) 97.5 F (36.4 C) (Oral)   Resp 17   Ht 5\' 7"  (1.702 m)   Wt 79.8 kg   SpO2 96%   BMI 27.57 kg/m   Intake/Output Summary (Last 24 hours) at 07/10/2021 1046 Last data filed at 07/10/2021 0600 Gross per 24 hour  Intake  238.81 ml  Output 100 ml  Net 138.81 ml   Weight change: -0.272 kg  Physical Exam: Gen: Appears uncomfortable sitting up in bed, confused on conversation CVS: Pulse regular bradycardia, S1 and S2 normal Resp: Coarse/transmitted breath sounds bilaterally Abd: Soft, obese, nontender, bowel sounds normal Ext: Trace lower extremity edema  Imaging: US RENAL  Result Date: 07/09/2021 CLINICAL DATA:  Acute renal failure. EXAM: RENAL / URINARY TRACT ULTRASOUND COMPLETE COMPARISON:  CT chest, abdomen, and pelvis 07/07/2021 FINDINGS: Decreased image resolution due to patient body habitus. Right Kidney: Renal measurements: 9.7 x 5.2 x 5.5 cm = volume: 146 mL. Echogenicity within normal limits. No mass or hydronephrosis visualized. Left Kidney: Renal measurements: 9.4 x 4.9 x 5.7 cm = volume: 136 mL. Echogenicity within normal limits. No mass or hydronephrosis visualized. Bladder: Appears normal for degree of bladder distention. Other: Bilateral pleural effusions. Splenomegaly with length of 13.5 cm and estimated volume of 456 cc. 2.6 cm mass between the liver and right kidney corresponding to the adrenal lesion on CT. IMPRESSION: 1. No hydronephrosis. 2. Bilateral pleural effusions. 3. Splenomegaly. 4. 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 Electronically Signed   By: Logan Bores M.D.   On: 07/09/2021 14:48   MR FEMUR LEFT WO CONTRAST  Result Date: 07/09/2021 CLINICAL DATA:  History of left hip ORIF. Evaluate soft tissue infection. EXAM: MR OF THE LEFT FEMUR WITHOUT CONTRAST TECHNIQUE: Multiplanar, multisequence MR imaging of the left femur was performed. No intravenous contrast was administered. COMPARISON:  None Available. FINDINGS: Bones/Joint/Cartilage No acute fracture or dislocation. Prior ORIF of a healed intertrochanteric fracture with susceptibility artifact partially obscuring the adjacent soft tissue and osseous  structures. Prior ORIF of a left femoral diaphysis fracture transfixed  with a lateral sideplate and multiple interlocking screws with susceptibility artifact partially obscuring the adjacent soft tissue and osseous structures. No aggressive osseous lesion. Normal alignment. No joint effusion. No marrow signal abnormality. Partial-thickness cartilage loss of the left femoral head and acetabulum. Right total hip arthroplasty partially visualized. No periarticular fluid collection or osteolysis. Muscles and Tendons Mild edema in the tensor fascia lata and vastus intermedius muscle adjacent to the hip concerning for mild myositis with surrounding soft tissue edema between the muscle planes. Mild muscle edema in the distal vastus lateralis muscle concerning for myositis versus muscle strain. No intramuscular fluid collection or hematoma. Soft tissue Generalized anasarca. No soft tissue mass. No fluid collection or hematoma. IMPRESSION: 1. No evidence of septic arthritis of the left hip. Mild-moderate osteoarthritis of the left hip. 2. Mild edema in the tensor fascia lata and vastus intermedius muscle adjacent to the hip concerning for mild myositis with surrounding soft tissue edema between the muscle planes. Mild muscle edema in the distal vastus lateralis muscle concerning for myositis versus muscle strain. No fluid collection to suggest an abscess. 3. Evaluation for infection related to the hardware is extremely limited given the degree of artifact resulting from the metallic hardware. Electronically Signed   By: Kathreen Devoid M.D.   On: 07/09/2021 08:34   MR KNEE LEFT WO CONTRAST  Result Date: 07/09/2021 CLINICAL DATA:  History of knee replacement. Evaluate for infection. EXAM: MRI OF THE LEFT KNEE WITHOUT CONTRAST TECHNIQUE: Multiplanar, multisequence MR imaging of the knee was performed. No intravenous contrast was administered. COMPARISON:  None Available. FINDINGS: Bones/Joint/Cartilage No acute fracture or dislocation. Left total knee arthroplasty with susceptibility artifact  severely limiting evaluation of the adjacent soft tissue and osseous structures. No periarticular fluid collection, hematoma or osteolysis. No evidence of hardware failure or complication within limitations of the artifact. Normal alignment. No joint effusion. No marrow signal abnormality. Muscles and Tendons Soft tissue edema involving the distal aspect of the vastus lateralis muscle concerning for muscle strain versus myositis. No intramuscular fluid collection or hematoma. Quadriceps tendon and patellar tendon are intact. Soft tissue No fluid collection or hematoma. No soft tissue mass. Generalized anasarca. IMPRESSION: 1. 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 No periarticular fluid collection, hematoma or osteolysis. No evidence of hardware failure or complication. 2. Soft tissue edema involving the distal aspect of the vastus lateralis muscle concerning for muscle strain versus myositis. Electronically Signed   By: Kathreen Devoid M.D.   On: 07/09/2021 08:37   IR REMOVAL TUN ACCESS W/ PORT W/O FL MOD SED  Result Date: 07/09/2021 INDICATION: Infection of the port site EXAM: REMOVAL RIGHT IJ VEIN PORT-A-CATH MEDICATIONS: None ANESTHESIA/SEDATION: Moderate (conscious) sedation was not employed during this procedure. Dilaudid 1 mg was administered intravenously by the radiology nurse. The patient's level of consciousness and vital signs were monitored continuously by radiology nursing throughout the procedure under my direct supervision. FLUOROSCOPY: None COMPLICATIONS: None immediate. PROCEDURE: Informed written consent was obtained from the patient after a thorough discussion of the procedural risks, benefits and alternatives. All questions were addressed. Maximal Sterile Barrier Technique was utilized including caps, mask, sterile gowns, sterile gloves, sterile drape, hand hygiene and skin antiseptic. A timeout  was performed prior to the initiation of the procedure. The right chest was prepped and draped in a sterile fashion. Lidocaine was utilized for local anesthesia. An  incision was made over the previously healed surgical incision. Utilizing blunt dissection, the port catheter and reservoir were removed from the underlying subcutaneous tissue in their entirety. Securing sutures were also removed. The pocket was irrigated with a copious amount of sterile normal saline. The pocket was closed with interrupted 3-0 Vicryl stitches. The subcutaneous tissue was closed with 3-0 Vicryl interrupted subcutaneous stitches. Dermabond was applied. IMPRESSION: Successful right IJ vein Port-A-Cath explant. Electronically Signed   By: Frazier Richards M.D.   On: 07/09/2021 15:47    Labs: BMET Recent Labs  Lab 07/07/21 0347 07/08/21 0133 07/09/21 0340 07/10/21 0244  NA 129* 130* 128* 129*  K 4.0 4.6 4.1 4.8  CL 99 102 98 100  CO2 20* 18* 20* 16*  GLUCOSE 215* 117* 125* 137*  BUN 36* 45* 59* 69*  CREATININE 2.43* 3.20* 4.56* 5.99*  CALCIUM 8.0* 8.0* 8.2* 8.1*  PHOS  --   --  4.0  --    CBC Recent Labs  Lab 07/07/21 0347 07/08/21 0133 07/09/21 0340 07/10/21 0244  WBC 10.9* 10.2 9.1 7.2  NEUTROABS 9.4*  --   --   --   HGB 9.4* 8.8* 9.2* 9.4*  HCT 27.0* 26.3* 27.0* 28.7*  MCV 98.2 99.6 99.6 103.6*  PLT 79* 51* 85* 107*    Medications:     vitamin C  500 mg Oral Daily   atorvastatin  40 mg Oral QHS   Chlorhexidine Gluconate Cloth  6 each Topical Q0600   docusate sodium  100 mg Oral BID   DULoxetine  20 mg Oral Daily   ferrous sulfate  325 mg Oral Q breakfast   insulin aspart  0-6 Units Subcutaneous TID WC   insulin aspart  3 Units Subcutaneous TID WC   melatonin  3 mg Oral QHS   mupirocin ointment  1 application. Nasal BID   pantoprazole  40 mg Oral Daily   polyethylene glycol  17 g Oral Daily   sodium chloride flush  3 mL Intravenous Q12H   tamsulosin  0.4 mg Oral Daily   Elmarie Shiley,  MD 07/10/2021, 10:46 AM

## 2021-07-10 NOTE — Progress Notes (Addendum)
   07/09/21 2050  Assess: MEWS Score  Temp 98.3 F (36.8 C)  BP (!) 100/48  Pulse Rate (!) 47  ECG Heart Rate (!) 48  Resp 15  Level of Consciousness Alert  SpO2 97 %  O2 Device Nasal Cannula  O2 Flow Rate (L/min) 2 L/min  Assess: MEWS Score  MEWS Temp 0  MEWS Systolic 1  MEWS Pulse 1  MEWS RR 0  MEWS LOC 0  MEWS Score 2  MEWS Score Color Yellow  Assess: if the MEWS score is Yellow or Red  Were vital signs taken at a resting state? Yes  Focused Assessment No change from prior assessment  Early Detection of Sepsis Score *See Row Information* Low  MEWS guidelines implemented *See Row Information* No, other (Comment) (Pt's heart rate low since beginnning of shift and SBP at baseline for pt.)  Treat  MEWS Interventions Escalated (See documentation below)  Pain Scale 0-10  Pain Score 6  Pain Type Chronic pain  Pain Location Back  Pain Orientation Posterior;Lower  Pain Descriptors / Indicators Aching  Pain Frequency Constant  Pain Onset On-going  Pain Intervention(s) Refused  Complains of Anxiety;Agitation;Other (Comment) (crying)  Interventions Patient refused medication;Deescalate;Relaxation;Reposition  Take Vital Signs  Increase Vital Sign Frequency  Yellow: Q 2hr X 2 then Q 4hr X 2, if remains yellow, continue Q 4hrs  Escalate  MEWS: Escalate Yellow: discuss with charge nurse/RN and consider discussing with provider and RRT  Notify: Charge Nurse/RN  Name of Charge Nurse/RN Notified Shanell, RN  Date Charge Nurse/RN Notified 07/09/21  Time Charge Nurse/RN Notified 2100  Notify: Provider  Provider Name/Title Dr. Hal Hope and Dr. Marcelle Smiling  Date Provider Notified 07/08/21  Time Provider Notified 2100  Method of Notification Page  Notification Reason Other (Comment) (Low heart rate and SBP)  Provider response No new orders (Awaiting callback from [facility])  Date of Provider Response 07/09/21  Time of Provider Response 2140  Document  Patient Outcome Other  (Comment) (Pt is asymptomatic at this time. Pt is having emotional distress see nurses notes for details.)  Progress note created (see row info) Yes

## 2021-07-10 NOTE — Progress Notes (Addendum)
Manufacturing engineer [facility]) Hospital Liaison Note  Referral received for patient/family interest in [facility]. Chart under review by [facility] physician.   Hospice eligibility confirmed.  Bed offered and accepted for transfer to [facility] today. Unit RN please call report to [phone] prior to patient leaving the unit. Please send signed DNR and paperwork with patient.    Please call with any questions or concerns. Thank you  Roselee Nova, [facility] Liaison [phone]

## 2021-07-10 NOTE — Discharge Summary (Signed)
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  Patient reported symptoms started after he has received chemotherapy.  Patient was seen as STEMI code in the ED due to ST elevation on initila EKG but then later canceled by cardiologist Dr. Burt Knack.  Patient presented with signs of severe sepsis (fever 102, HR 113, RR 40, BP 80/63 which improved after IV fluid boluses.  Patient was started on empiric antibiotics( vancomycin metronidazole and cefepime).  Cardiology is also consulted for elevated troponin and started on heparin drip.  Cardiology recommended cardiac work-up once patient is hemodynamically optimized.  Infectious disease consulted recommended TEE and removal of port.  Chemo-Port removed.  Serum creatinine going up, patient does not want aggressive intervention. Patient doesn't want heroic efforts to prolong his life, He has chosen to be DNR/DNI.  Family  was contacted and palliative care consulted. Family has decided comfort care. Patient is being discharged to [facility] place.  Discharge Diagnoses:  Principal Problem:   Severe sepsis ([place]) Active Problems:   NSTEMI (non-ST elevated myocardial infarction) (HCC)    Hypotension   Acute kidney injury superimposed on chronic kidney disease ([place])   Uncontrolled type 2 diabetes mellitus with hyperglycemia, with long-term current use of insulin (HCC)   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HCC)   Macrocytic anemia   Alcohol dependence (HCC)   Tobacco abuse   Gastro-esophageal reflux disease without esophagitis   Transaminitis   Hyperbilirubinemia   Thrombocytopenia (HCC)    Discharge Instructions  Discharge Instructions     Call MD for:  difficulty breathing, headache or visual disturbances   Complete by: As directed    Call MD for:  persistant dizziness or light-headedness   Complete by: As directed    Call MD for:  persistant nausea and vomiting   Complete by: As directed    Diet - low sodium heart healthy   Complete by: As directed    Increase activity slowly   Complete by: As directed    No wound care   Complete by: As directed       Allergies as of 07/10/2021   No Known Allergies      Medication List     STOP taking these medications    acetaminophen 325 MG tablet Commonly known as: TYLENOL   Admelog 100 UNIT/ML injection Generic drug: insulin lispro   apixaban 5 MG Tabs tablet Commonly known as: ELIQUIS   atorvastatin 20 MG tablet Commonly known as: LIPITOR   bisacodyl 10 MG suppository Commonly known as: DULCOLAX   Calmoseptine 0.44-20.6 % Oint Generic drug: Menthol-Zinc Oxide   capecitabine 500 MG tablet Commonly known as: XELODA   cholecalciferol 25 MCG (1000 UNIT) tablet Commonly known as: VITAMIN D3   docusate sodium 100 MG capsule Commonly known as: COLACE   DULoxetine 20 MG capsule Commonly known as: CYMBALTA   emollient cream  Commonly known as: BIAFINE   Ensure Max Protein Liqd   feeding supplement (GLUCERNA SHAKE) Liqd   ferrous sulfate 325 (65 FE) MG tablet   HYDROcodone-acetaminophen 5-325 MG tablet Commonly known as: NORCO/VICODIN   Melatonin 2.5 MG Chew   metFORMIN 500 MG tablet Commonly known as:  Glucophage   methocarbamol 500 MG tablet Commonly known as: ROBAXIN   ondansetron 8 MG disintegrating tablet Commonly known as: ZOFRAN-ODT   pantoprazole 40 MG tablet Commonly known as: PROTONIX   polyethylene glycol powder 17 GM/SCOOP powder Commonly known as: GLYCOLAX/MIRALAX   prochlorperazine 10 MG tablet Commonly known as: COMPAZINE   senna-docusate 8.6-50 MG tablet Commonly known as: Senokot-S   tamsulosin 0.4 MG Caps capsule Commonly known as: FLOMAX   vitamin C 500 MG tablet Commonly known as: ASCORBIC ACID       TAKE these medications    ondansetron 4 MG tablet Commonly known as: ZOFRAN Take 1 tablet (4 mg total) by mouth every 6 (six) hours as needed for nausea.        No Known Allergies  Consultations: Cardio, Nephro, ID, IR   Procedures/Studies: US RENAL  Result Date: 07/09/2021 CLINICAL DATA:  Acute renal failure. EXAM: RENAL / URINARY TRACT ULTRASOUND COMPLETE COMPARISON:  CT chest, abdomen, and pelvis 07/07/2021 FINDINGS: Decreased image resolution due to patient body habitus. Right Kidney: Renal measurements: 9.7 x 5.2 x 5.5 cm = volume: 146 mL. Echogenicity within normal limits. No mass or hydronephrosis visualized. Left Kidney: Renal measurements: 9.4 x 4.9 x 5.7 cm = volume: 136 mL. Echogenicity within normal limits. No mass or hydronephrosis visualized. Bladder: Appears normal for degree of bladder distention. Other: Bilateral pleural effusions. Splenomegaly with length of 13.5 cm and estimated volume of 456 cc. 2.6 cm mass between the liver and right kidney corresponding to the adrenal lesion on CT. IMPRESSION: 1. No hydronephrosis. 2. Bilateral pleural effusions. 3. Splenomegaly. 4. 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 Electronically Signed   By: Logan Bores M.D.   On: 07/09/2021 14:48   MR FEMUR LEFT WO CONTRAST  Result Date: 07/09/2021 CLINICAL DATA:  History of left hip ORIF. Evaluate soft tissue infection. EXAM: MR OF THE  LEFT FEMUR WITHOUT CONTRAST TECHNIQUE: Multiplanar, multisequence MR imaging of the left femur was performed. No intravenous contrast was administered. COMPARISON:  None Available. FINDINGS: Bones/Joint/Cartilage No acute fracture or dislocation. Prior ORIF of a healed intertrochanteric fracture with susceptibility artifact partially obscuring the adjacent soft tissue and osseous structures. Prior ORIF of a left femoral diaphysis fracture transfixed with a lateral sideplate and multiple interlocking screws with susceptibility artifact partially obscuring the adjacent soft tissue and osseous structures. No aggressive osseous lesion. Normal alignment. No joint effusion. No marrow signal abnormality. Partial-thickness cartilage loss of the left femoral head and acetabulum. Right total hip arthroplasty partially visualized. No periarticular fluid collection or osteolysis. Muscles and Tendons Mild edema in the tensor fascia lata and vastus intermedius muscle adjacent to the hip concerning for mild myositis with surrounding soft tissue edema between the muscle planes. Mild muscle edema in the distal vastus lateralis muscle concerning for myositis versus muscle strain. No intramuscular fluid collection or hematoma. Soft tissue Generalized anasarca. No soft tissue mass. No fluid collection or hematoma. IMPRESSION: 1. No evidence of septic arthritis of the left hip. Mild-moderate osteoarthritis of the left hip. 2. Mild edema in the tensor fascia lata and vastus intermedius muscle adjacent to the hip concerning for mild myositis with surrounding soft tissue edema  between the muscle planes. Mild muscle edema in the distal vastus lateralis muscle concerning for myositis versus muscle strain. No fluid collection to suggest an abscess. 3. Evaluation for infection related to the hardware is extremely limited given the degree of artifact resulting from the metallic hardware. Electronically Signed   By: Kathreen Devoid M.D.   On:  07/09/2021 08:34   MR KNEE LEFT WO CONTRAST  Result Date: 07/09/2021 CLINICAL DATA:  History of knee replacement. Evaluate for infection. EXAM: MRI OF THE LEFT KNEE WITHOUT CONTRAST TECHNIQUE: Multiplanar, multisequence MR imaging of the knee was performed. No intravenous contrast was administered. COMPARISON:  None Available. FINDINGS: Bones/Joint/Cartilage No acute fracture or dislocation. 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 No periarticular fluid collection, hematoma or osteolysis. No evidence of hardware failure or complication within limitations of the artifact. Normal alignment. No joint effusion. No marrow signal abnormality. Muscles and Tendons Soft tissue edema involving the distal aspect of the vastus lateralis muscle concerning for muscle strain versus myositis. No intramuscular fluid collection or hematoma. Quadriceps tendon and patellar tendon are intact. Soft tissue No fluid collection or hematoma. No soft tissue mass. Generalized anasarca. IMPRESSION: 1. 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 No periarticular fluid collection, hematoma or osteolysis. No evidence of hardware failure or complication. 2. Soft tissue edema involving the distal aspect of the vastus lateralis muscle concerning for muscle strain versus myositis. Electronically Signed   By: Kathreen Devoid M.D.   On: 07/09/2021 08:37   IR REMOVAL TUN ACCESS W/ PORT W/O FL MOD SED  Result Date: 07/09/2021 INDICATION: Infection of the port site EXAM: REMOVAL RIGHT IJ VEIN PORT-A-CATH MEDICATIONS: None ANESTHESIA/SEDATION: Moderate (conscious) sedation was not employed during this procedure. Dilaudid 1 mg was administered intravenously by the radiology nurse. The patient's level of consciousness and vital signs were monitored continuously by  radiology nursing throughout the procedure under my direct supervision. FLUOROSCOPY: None COMPLICATIONS: None immediate. PROCEDURE: Informed written consent was obtained from the patient after a thorough discussion of the procedural risks, benefits and alternatives. All questions were addressed. Maximal Sterile Barrier Technique was utilized including caps, mask, sterile gowns, sterile gloves, sterile drape, hand hygiene and skin antiseptic. A timeout was performed prior to the initiation of the procedure. The right chest was prepped and draped in a sterile fashion. Lidocaine was utilized for local anesthesia. An incision was made over the previously healed surgical incision. Utilizing blunt dissection, the port catheter and reservoir were removed from the underlying subcutaneous tissue in their entirety. Securing sutures were also removed. The pocket was irrigated with a copious amount of sterile normal saline. The pocket was closed with interrupted 3-0 Vicryl stitches. The subcutaneous tissue was closed with 3-0 Vicryl interrupted subcutaneous stitches. Dermabond was applied. IMPRESSION: Successful right IJ vein Port-A-Cath explant. Electronically Signed   By: Frazier Richards M.D.   On: 07/09/2021 15:47   DG Chest Port 1 View  Result Date: 07/07/2021 CLINICAL DATA:  Questionable sepsis. EXAM: PORTABLE CHEST 1 VIEW COMPARISON:  12/30/2020. FINDINGS: The heart is enlarged and the mediastinal contours within normal limits. Atherosclerotic calcification of the aorta is noted. The distal tip of right chest port terminates at the cavoatrial junction. Lung volumes are low. Mild atelectasis is noted at the lung bases bilaterally. There is a trace right pleural effusion. No pneumothorax. No acute osseous abnormality. IMPRESSION: 1. Low lung  volumes with mild atelectasis at the lung bases. 2. Trace right pleural effusion. 3. Cardiomegaly. Electronically Signed   By: Brett Fairy M.D.   On: 07/07/2021 04:18    ECHOCARDIOGRAM COMPLETE  Result Date: 07/07/2021    ECHOCARDIOGRAM REPORT   Patient Name:   Corey Gonzalez Date of Exam: 07/07/2021 Medical Rec #:  [id number]    Height:       67.0 in Accession #:    [id number]   Weight:       106.5 lb Date of Birth:  10-25-1940     BSA:          1.547 m Patient Age:    61 years     BP:           99/68 mmHg Patient Gender: M            HR:           93 bpm. Exam Location:  Inpatient Procedure: 2D Echo, Cardiac Doppler and Color Doppler Indications:    Elevated troponin  History:        Patient has no prior history of Echocardiogram examinations.  Sonographer:    [place] Referring Phys: [id number] [place]  1. Left ventricular ejection fraction, by estimation, is 45-50%. The left ventricle is mildly decreased. The left ventricle demonstrates regional wall motion abnormalities (mid-apical septal hypokinesis). 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 Left ventricular diastolic parameters are consistent with Grade I diastolic dysfunction (impaired relaxation).  2. Right ventricular systolic function is normal. The right ventricular size is mildly enlarged.  3. The mitral valve is grossly normal. Mild to moderate mitral valve regurgitation. Moderate mitral annular calcification. Pulmonary vein blunting, there are two jets.  4. Tricuspid valve regurgitation is mild-moderate, eccentric, and functional.  5. The aortic valve is tricuspid. Aortic valve regurgitation is not visualized. No aortic stenosis is present. Comparison(s): No prior Echocardiogram. FINDINGS  Left Ventricle: Left ventricular ejection fraction, by estimation, is 45 to 50%. The left ventricle has mildly decreased function. The left ventricle demonstrates regional wall motion abnormalities. The left ventricular internal cavity size was normal in size. 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 Left ventricular  diastolic parameters are consistent with Grade I diastolic dysfunction (impaired relaxation).  LV Wall Scoring: The mid and distal anterior septum and mid inferoseptal segment are hypokinetic. Right Ventricle: The right ventricular size is mildly enlarged. No increase in right ventricular wall thickness. Right ventricular systolic function is normal. Left Atrium: Left atrial size was normal in size. Right Atrium: Right atrial size was normal in size. Pericardium: There is no evidence of pericardial effusion. Mitral Valve: The mitral valve is grossly normal. Moderate mitral annular calcification. Mild to moderate mitral valve regurgitation. Tricuspid Valve: Eccentric. The tricuspid valve is normal in structure. Tricuspid valve regurgitation is mild to moderate. Aortic Valve: The aortic valve is tricuspid. Aortic valve regurgitation is not visualized. No aortic stenosis is present. Aortic valve mean gradient measures 4.0 mmHg. Aortic valve peak gradient measures 8.0 mmHg. Aortic valve area, by VTI measures 2.02 cm. Pulmonic Valve: The pulmonic valve was not well visualized. Pulmonic valve regurgitation is not visualized. No evidence of pulmonic stenosis. Aorta: The aortic root and ascending aorta are structurally normal, with no evidence of dilitation. IAS/Shunts: No atrial level shunt detected by color flow Doppler.  LEFT VENTRICLE PLAX 2D LVIDd:         4.30 cm  Diastology LVIDs:         3.30 cm     LV e' medial:    6.09 cm/s LV PW:         1.30 cm     LV E/e' medial:  15.3 LV IVS:        1.60 cm     LV e' lateral:   8.16 cm/s LVOT diam:     2.20 cm     LV E/e' lateral: 11.4 LV SV:         49 LV SV Index:   32 LVOT Area:     3.80 cm  LV Volumes (MOD) LV vol d, MOD A2C: 92.7 ml LV vol d, MOD A4C: 92.5 ml LV vol s, MOD A2C: 45.6 ml LV vol s, MOD A4C: 33.0 ml LV SV MOD A2C:     47.1 ml LV SV MOD A4C:     92.5 ml LV SV MOD BP:      53.9 ml RIGHT VENTRICLE RV Basal diam:  4.40 cm RV Mid diam:    4.00 cm RV S prime:      7.51 cm/s TAPSE (M-mode): 1.7 cm LEFT ATRIUM             Index        RIGHT ATRIUM           Index LA diam:        2.30 cm 1.49 cm/m   RA Area:     17.60 cm LA Vol (A2C):   32.6 ml 21.07 ml/m  RA Volume:   39.50 ml  25.53 ml/m LA Vol (A4C):   40.0 ml 25.86 ml/m LA Biplane Vol: 38.2 ml 24.69 ml/m  AORTIC VALVE                    PULMONIC VALVE AV Area (Vmax):    1.97 cm     PV Vmax:       0.86 m/s AV Area (Vmean):   1.90 cm     PV Peak grad:  3.0 mmHg AV Area (VTI):     2.02 cm AV Vmax:           141.00 cm/s AV Vmean:          95.800 cm/s AV VTI:            0.243 m AV Peak Grad:      8.0 mmHg AV Mean Grad:      4.0 mmHg LVOT Vmax:         73.20 cm/s LVOT Vmean:        47.900 cm/s LVOT VTI:          0.129 m LVOT/AV VTI ratio: 0.53  AORTA Ao Root diam: 3.20 cm Ao Asc diam:  3.40 cm MITRAL VALVE                TRICUSPID VALVE MV Area (PHT): 7.74 cm     TR Peak grad:   30.9 mmHg MV Decel Time: 98 msec      TR Vmax:        278.00 cm/s MR Peak grad: 78.5 mmHg MR Mean grad: 50.0 mmHg     SHUNTS MR Vmax:      443.00 cm/s   Systemic VTI:  0.13 m MR Vmean:     328.0 cm/s    Systemic Diam: 2.20 cm MV E velocity: 93.10 cm/s MV A velocity: 108.00 cm/s MV E/A ratio:  0.86 Mahesh Chandrasekhar  MD Electronically signed by Rudean Haskell MD Signature Date/Time: 07/07/2021/12:18:58 PM    Final    CT CHEST ABDOMEN PELVIS WO CONTRAST  Result Date: 07/07/2021 CLINICAL DATA:  Chest wall pain, nontraumatic, with infection or inflammation suspected. Fever EXAM: CT CHEST, ABDOMEN AND PELVIS WITHOUT CONTRAST TECHNIQUE: Multidetector CT imaging of the chest, abdomen and pelvis was performed following the standard protocol without IV contrast. RADIATION DOSE REDUCTION: This exam was performed according to the departmental dose-optimization program which includes automated exposure control, adjustment of the mA and/or kV according to patient size and/or use of iterative reconstruction technique. COMPARISON:  None Available.  FINDINGS: CT CHEST FINDINGS Cardiovascular: Normal heart size. No pericardial effusion. Extensive atheromatous calcification of the aorta and coronaries. Right-sided porta catheter with tip at the upper cavoatrial junction. Mediastinum/Nodes: Negative for mass or adenopathy Lungs/Pleura: Dependent atelectasis. There is no edema, consolidation, effusion, or pneumothorax. Musculoskeletal: No acute finding. Gynecomastia. CT ABDOMEN PELVIS FINDINGS Hepatobiliary: Lobulated liver with large caudate lobe and fissures. Cholelithiasis without findings of acute cholecystitis. Pancreas: Generalized atrophy. Spleen: Mildly enlarged with 14 cm craniocaudal span. Adrenals/Urinary Tract: 2.8 cm right adrenal mass. Heterogeneity of renal cortex with patchy high-density areas bilaterally. Contrast is being excreted from unknown prior study. Negative bladder. Stomach/Bowel: Descending colostomy. Negative for bowel obstruction or visible inflammation. Vascular/Lymphatic: Atheromatous calcification of the aorta and branch vessels. 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 IMPRESSION: 1. No acute finding. 2. 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 History of lung cancer, recommend follow-up with prior staging scans. 3. Recent contrast administration from unknown procedure. Patchy renal cortical density could be from ATN or scarring. 4. Cirrhotic appearance of the liver. Please correlate for risk factors. 5.  Aortic Atherosclerosis (ICD10-I70.0). 6. Cholelithiasis. Electronically Signed   By: Jorje Guild M.D.   On: 07/07/2021 06:03   US Abdomen Limited RUQ (LIVER/GB)  Result Date: 07/07/2021 CLINICAL DATA:  Evaluate for choledocholithiasis. EXAM: ULTRASOUND ABDOMEN LIMITED RIGHT UPPER QUADRANT COMPARISON:  CT 07/07/2021 FINDINGS: Gallbladder:  Large stone within the gallbladder measures 1.4 cm. Debris/sludge noted within the gallbladder. Mild gallbladder wall thickening measures 4.4 mm. Negative sonographic Murphy's sign. Common bile duct: Diameter: 5 mm Liver: Increased parenchymal echogenicity. Portal vein is patent on color Doppler imaging with normal direction of blood flow towards the liver. Other: None. IMPRESSION: 1. Gallstone and sludge noted within gallbladder. Mild gallbladder wall thickening. Note: In the setting of cirrhosis (as suggested on CT from earlier today), gallbladder wall thickening may be a nonspecific finding. If there is a high suspicion for acute cholecystitis consider further investigation with nuclear medicine hepatic biliary scan. 2. Increased parenchymal echogenicity suggestive of hepatic steatosis. 3. No biliary ductal dilatation identified. Electronically Signed   By: Kerby Moors M.D.   On: 07/07/2021 10:04      Subjective: Patient was seen and examined, remains lethargic but comfortable.  Patient is being discharged to [facility] place.  Discharge Exam: Vitals:   07/10/21 0405 07/10/21 0836  BP: 115/90 (!) 89/43  Pulse: (!) 44 (!) 47  Resp: 14 17  Temp:  (!) 97.5 F (36.4 C)  SpO2: 98% 96%   Vitals:   07/10/21 0340 07/10/21 0405 07/10/21 0618 07/10/21 0836  BP:  115/90  (!) 89/43  Pulse:  (!) 44  (!) 47  Resp: 16 14  17   Temp:    (!) 97.5 F (  36.4 C)  TempSrc:    Oral  SpO2:  98%  96%  Weight:   79.8 kg   Height:        General: Pt is lethargic, not in acute distress Cardiovascular: RRR, S1/S2 +, no rubs, no gallops Respiratory: CTA bilaterally, no wheezing, no rhonchi Abdominal: Soft, NT, ND, bowel sounds + Extremities: no edema, no cyanosis    The results of significant diagnostics from this hospitalization (including imaging, microbiology, ancillary and laboratory) are listed below for reference.     Microbiology: Recent Results (from the past 240 hour(s))  Blood Culture  (routine x 2)     Status: Abnormal (Preliminary result)   Collection Time: 07/07/21  4:00 AM   Specimen: BLOOD RIGHT HAND  Result Value Ref Range Status   Specimen Description BLOOD RIGHT HAND  Final   Special Requests   Final    BOTTLES DRAWN AEROBIC AND ANAEROBIC Blood Culture results may not be optimal due to an inadequate volume of blood received in culture bottles   Culture  Setup Time   Final    GRAM POSITIVE COCCI IN CLUSTERS IN BOTH AEROBIC AND ANAEROBIC BOTTLES CRITICAL RESULT CALLED TO, READ BACK BY AND VERIFIED WITH: PHARMD EDEN BREWINGTON ON 07/07/21 @ 1640 BY DRT    Culture (A)  Final    METHICILLIN RESISTANT STAPHYLOCOCCUS AUREUS Sent to [facility] for further susceptibility testing. Performed at [facility], [place] [address], [place], [place] [address]    Report Status PENDING  Incomplete   Organism ID, Bacteria METHICILLIN RESISTANT STAPHYLOCOCCUS AUREUS  Final      Susceptibility   Methicillin resistant staphylococcus aureus - MIC*    CIPROFLOXACIN >=8 RESISTANT Resistant     ERYTHROMYCIN RESISTANT Resistant     GENTAMICIN <=0.5 SENSITIVE Sensitive     OXACILLIN >=4 RESISTANT Resistant     TETRACYCLINE <=1 SENSITIVE Sensitive     VANCOMYCIN <=0.5 SENSITIVE Sensitive     TRIMETH/SULFA <=10 SENSITIVE Sensitive     CLINDAMYCIN RESISTANT Resistant     RIFAMPIN <=0.5 SENSITIVE Sensitive     Inducible Clindamycin POSITIVE Resistant     * METHICILLIN RESISTANT STAPHYLOCOCCUS AUREUS  Blood Culture ID Panel (Reflexed)     Status: Abnormal   Collection Time: 07/07/21  4:00 AM  Result Value Ref Range Status   Enterococcus faecalis NOT DETECTED NOT DETECTED Final   Enterococcus Faecium NOT DETECTED NOT DETECTED Final   Listeria monocytogenes NOT DETECTED NOT DETECTED Final   Staphylococcus species DETECTED (A) NOT DETECTED Final    Comment: CRITICAL RESULT CALLED TO, READ BACK BY AND VERIFIED WITH: PHARMD EDEN BREWINGTON ON 07/07/21 @ 1640 BY DRT    Staphylococcus  aureus (BCID) DETECTED (A) NOT DETECTED Final    Comment: Methicillin (oxacillin)-resistant Staphylococcus aureus (MRSA). MRSA is predictably resistant to beta-lactam antibiotics (except ceftaroline). Preferred therapy is vancomycin unless clinically contraindicated. Patient requires contact precautions if  hospitalized. CRITICAL RESULT CALLED TO, READ BACK BY AND VERIFIED WITH: PHARMD EDEN BREWINGTON ON 07/07/21 @ 1640 BY DRT    Staphylococcus epidermidis NOT DETECTED NOT DETECTED Final   Staphylococcus lugdunensis NOT DETECTED NOT DETECTED Final   Streptococcus species NOT DETECTED NOT DETECTED Final   Streptococcus agalactiae NOT DETECTED NOT DETECTED Final   Streptococcus pneumoniae NOT DETECTED NOT DETECTED Final   Streptococcus pyogenes NOT DETECTED NOT DETECTED Final   A.calcoaceticus-baumannii NOT DETECTED NOT DETECTED Final   Bacteroides fragilis NOT DETECTED NOT DETECTED Final   Enterobacterales NOT DETECTED NOT DETECTED Final  Enterobacter cloacae complex NOT DETECTED NOT DETECTED Final   Escherichia coli NOT DETECTED NOT DETECTED Final   Klebsiella aerogenes NOT DETECTED NOT DETECTED Final   Klebsiella oxytoca NOT DETECTED NOT DETECTED Final   Klebsiella pneumoniae NOT DETECTED NOT DETECTED Final   Proteus species NOT DETECTED NOT DETECTED Final   Salmonella species NOT DETECTED NOT DETECTED Final   Serratia marcescens NOT DETECTED NOT DETECTED Final   Haemophilus influenzae NOT DETECTED NOT DETECTED Final   Neisseria meningitidis NOT DETECTED NOT DETECTED Final   Pseudomonas aeruginosa NOT DETECTED NOT DETECTED Final   Stenotrophomonas maltophilia NOT DETECTED NOT DETECTED Final   Candida albicans NOT DETECTED NOT DETECTED Final   Candida auris NOT DETECTED NOT DETECTED Final   Candida glabrata NOT DETECTED NOT DETECTED Final   Candida krusei NOT DETECTED NOT DETECTED Final   Candida parapsilosis NOT DETECTED NOT DETECTED Final   Candida tropicalis NOT DETECTED NOT  DETECTED Final   Cryptococcus neoformans/gattii NOT DETECTED NOT DETECTED Final   Meth resistant mecA/C and MREJ DETECTED (A) NOT DETECTED Final    Comment: CRITICAL RESULT CALLED TO, READ BACK BY AND VERIFIED WITH: PHARMD EDEN BREWINGTON ON 07/07/21 @ 1640 BY DRT Performed at [facility], [address] [place], [place] [address]   Min Inhibitory Conc (2 Drugs)     Status: None (Preliminary result)   Collection Time: 07/07/21  4:00 AM  Result Value Ref Range Status   Min Inhibitory Conc (2 Drugs) Preliminary report  Final    Comment: (NOTE) Performed At: [facility] [place], [place] [id number] Rush Farmer MD MG:[id number]    Source ([place]) PENDING  Incomplete  MIC Results (2 Drugs)     Status: None   Collection Time: 07/07/21  4:00 AM  Result Value Ref Range Status   RESULT 5 MIC (RESULT 1)                Base Name: Staphylococcus aureus  Final    Comment: (NOTE) Identification performed by account, not confirmed by this laboratory. Performed At: [facility] [place], [place] [id number] Rush Farmer MD UE:[id number]   Resp Panel by RT-PCR (Flu A&B, Covid) Nasopharyngeal Swab     Status: None   Collection Time: 07/07/21  4:06 AM   Specimen: Nasopharyngeal Swab; Nasopharyngeal(NP) swabs in vial transport medium  Result Value Ref Range Status   SARS Coronavirus 2 by RT PCR NEGATIVE NEGATIVE Final    Comment: (NOTE) SARS-CoV-2 target nucleic acids are NOT DETECTED.  The SARS-CoV-2 RNA is generally detectable in upper respiratory specimens during the acute phase of infection. The lowest concentration of SARS-CoV-2 viral copies this assay can detect is 138 copies/mL. A negative result does not preclude SARS-Cov-2 infection and should not be used as the sole basis for treatment or other patient management decisions. A negative result may occur with  improper specimen collection/handling, submission of specimen other than  nasopharyngeal swab, presence of viral mutation(s) within the areas targeted by this assay, and inadequate number of viral copies(<138 copies/mL). A negative result must be combined with clinical observations, patient history, and epidemiological information. The expected result is Negative.  Fact Sheet for Patients:  EntrepreneurPulse.com.au  Fact Sheet for Healthcare Providers:  IncredibleEmployment.be  This test is no t yet approved or cleared by the [place] FDA and  has been authorized for detection and/or diagnosis of SARS-CoV-2 by FDA under an Emergency Use Authorization (EUA). This EUA will remain  in effect (  meaning this test can be used) for the duration of the COVID-19 declaration under Section 564(b)(1) of the Act, 21 U.S.C.section 360bbb-3(b)(1), unless the authorization is terminated  or revoked sooner.       Influenza A by PCR NEGATIVE NEGATIVE Final   Influenza B by PCR NEGATIVE NEGATIVE Final    Comment: (NOTE) The Xpert Xpress SARS-CoV-2/FLU/RSV plus assay is intended as an aid in the diagnosis of influenza from Nasopharyngeal swab specimens and should not be used as a sole basis for treatment. Nasal washings and aspirates are unacceptable for Xpert Xpress SARS-CoV-2/FLU/RSV testing.  Fact Sheet for Patients: EntrepreneurPulse.com.au  Fact Sheet for Healthcare Providers: IncredibleEmployment.be  This test is not yet approved or cleared by the [place] FDA and has been authorized for detection and/or diagnosis of SARS-CoV-2 by FDA under an Emergency Use Authorization (EUA). This EUA will remain in effect (meaning this test can be used) for the duration of the COVID-19 declaration under Section 564(b)(1) of the Act, 21 U.S.C. section 360bbb-3(b)(1), unless the authorization is terminated or revoked.  Performed at [facility], [place] [address], [place], [place] [address]    Blood Culture (routine x 2)     Status: Abnormal (Preliminary result)   Collection Time: 07/07/21  4:15 AM   Specimen: BLOOD  Result Value Ref Range Status   Specimen Description BLOOD LEFT ANTECUBITAL  Final   Special Requests   Final    BOTTLES DRAWN AEROBIC AND ANAEROBIC Blood Culture adequate volume   Culture  Setup Time   Final    GRAM POSITIVE COCCI IN CLUSTERS IN BOTH AEROBIC AND ANAEROBIC BOTTLES CRITICAL VALUE NOTED.  VALUE IS CONSISTENT WITH PREVIOUSLY REPORTED AND CALLED VALUE. CRITICAL RESULT CALLED TO, READ BACK BY AND VERIFIED WITH: PHARMD ERicki Miller [id number] @1729  FH      Culture (A)  Final    STAPHYLOCOCCUS AUREUS SUSCEPTIBILITIES PERFORMED ON PREVIOUS CULTURE WITHIN THE LAST 5 DAYS. Performed at [facility], [place] [address], [place], [place] [address]    Report Status PENDING  Incomplete  Urine Culture     Status: None   Collection Time: 07/07/21  7:42 AM   Specimen: In/Out Cath Urine  Result Value Ref Range Status   Specimen Description IN/OUT CATH URINE  Final   Special Requests NONE  Final   Culture   Final    NO GROWTH Performed at [facility], [place] [address], [place], [place] [address]    Report Status 07/08/2021 FINAL  Final  Culture, blood (Routine X 2) w Reflex to ID Panel     Status: None (Preliminary result)   Collection Time: 07/08/21  9:25 AM   Specimen: BLOOD LEFT HAND  Result Value Ref Range Status   Specimen Description BLOOD LEFT HAND  Final   Special Requests   Final    BOTTLES DRAWN AEROBIC AND ANAEROBIC Blood Culture adequate volume   Culture   Final    NO GROWTH 2 DAYS Performed at [facility], [place] [address], [name], [place] [address]    Report Status PENDING  Incomplete  Culture, blood (Routine X 2) w Reflex to ID Panel     Status: None (Preliminary result)   Collection Time: 07/08/21  9:25 AM   Specimen: BLOOD LEFT HAND  Result Value Ref Range Status   Specimen Description BLOOD LEFT HAND  Final   Special  Requests   Final    BOTTLES DRAWN AEROBIC AND ANAEROBIC Blood Culture adequate  volume   Culture   Final    NO GROWTH 2 DAYS Performed at [facility], [place] [address], [place], [place] [address]    Report Status PENDING  Incomplete  MRSA Next Gen by PCR, Nasal     Status: Abnormal   Collection Time: 07/08/21  3:07 PM   Specimen: Nasal Mucosa; Nasal Swab  Result Value Ref Range Status   MRSA by PCR Next Gen DETECTED (A) NOT DETECTED Final    Comment: RESULT CALLED TO, READ BACK BY AND VERIFIED WITH: L MITCHELL,RN@2132  07/08/21 [place] (NOTE) The GeneXpert MRSA Assay (FDA approved for NASAL specimens only), is one component of a comprehensive MRSA colonization surveillance program. It is not intended to diagnose MRSA infection nor to guide or monitor treatment for MRSA infections. Test performance is not FDA approved in patients less than 63 years old. Performed at [facility], [place] [address], [place], [place] [address]      Labs: BNP (last 3 results) No results for input(s): BNP in the last 8760 hours. Basic Metabolic Panel: Recent Labs  Lab 07/07/21 0347 07/08/21 0133 07/09/21 0340 07/10/21 0244  NA 129* 130* 128* 129*  K 4.0 4.6 4.1 4.8  CL 99 102 98 100  CO2 20* 18* 20* 16*  GLUCOSE 215* 117* 125* 137*  BUN 36* 45* 59* 69*  CREATININE 2.43* 3.20* 4.56* 5.99*  CALCIUM 8.0* 8.0* 8.2* 8.1*  MG 1.5*  --  1.7  --   PHOS  --   --  4.0  --    Liver Function Tests: Recent Labs  Lab 07/07/21 0347 07/08/21 0133 07/09/21 0340  AST 92* 85* 62*  ALT 33 27 26  ALKPHOS 80 76 79  BILITOT 2.0* 1.7* 1.3*  PROT 6.1* 5.6* 5.9*  ALBUMIN 2.6* 2.2* 2.4*   No results for input(s): LIPASE, AMYLASE in the last 168 hours. No results for input(s): AMMONIA in the last 168 hours. CBC: Recent Labs  Lab 07/07/21 0347 07/08/21 0133 07/09/21 0340 07/10/21 0244  WBC 10.9* 10.2 9.1 7.2  NEUTROABS 9.4*  --   --   --   HGB 9.4* 8.8* 9.2* 9.4*  HCT 27.0* 26.3* 27.0* 28.7*   MCV 98.2 99.6 99.6 103.6*  PLT 79* 51* 85* 107*   Cardiac Enzymes: Recent Labs  Lab 07/07/21 1134 07/09/21 0340  CKTOTAL 531* 187   BNP: Invalid input(s): POCBNP CBG: Recent Labs  Lab 07/09/21 1204 07/09/21 1542 07/09/21 2208 07/10/21 0803 07/10/21 1134  GLUCAP 141* 143* 134* 133* 150*   D-Dimer No results for input(s): DDIMER in the last 72 hours. Hgb A1c No results for input(s): HGBA1C in the last 72 hours. Lipid Profile No results for input(s): CHOL, HDL, LDLCALC, TRIG, CHOLHDL, LDLDIRECT in the last 72 hours. Thyroid function studies No results for input(s): TSH, T4TOTAL, T3FREE, THYROIDAB in the last 72 hours.  Invalid input(s): FREET3 Anemia work up No results for input(s): VITAMINB12, FOLATE, FERRITIN, TIBC, IRON, RETICCTPCT in the last 72 hours. Urinalysis    Component Value Date/Time   COLORURINE YELLOW 07/07/2021 0739   APPEARANCEUR HAZY (A) 07/07/2021 0739   LABSPEC 1.040 (H) 07/07/2021 0739   PHURINE 5.0 07/07/2021 0739   GLUCOSEU NEGATIVE 07/07/2021 0739   HGBUR NEGATIVE 07/07/2021 0739   BILIRUBINUR NEGATIVE 07/07/2021 0739   KETONESUR NEGATIVE 07/07/2021 0739   PROTEINUR NEGATIVE 07/07/2021 0739   NITRITE NEGATIVE 07/07/2021 0739   LEUKOCYTESUR NEGATIVE 07/07/2021 0739   Sepsis Labs Invalid input(s): PROCALCITONIN,  WBC,  [facility] Recent Results (from the past 240 hour(s))  Blood Culture (routine x 2)     Status: Abnormal (Preliminary result)   Collection Time: 07/07/21  4:00 AM   Specimen: BLOOD RIGHT HAND  Result Value Ref Range Status   Specimen Description BLOOD RIGHT HAND  Final   Special Requests   Final    BOTTLES DRAWN AEROBIC AND ANAEROBIC Blood Culture results may not be optimal due to an inadequate volume of blood received in culture bottles   Culture  Setup Time   Final    GRAM POSITIVE COCCI IN CLUSTERS IN BOTH AEROBIC AND ANAEROBIC BOTTLES CRITICAL RESULT CALLED TO, READ BACK BY AND VERIFIED WITH: PHARMD  EDEN BREWINGTON ON 07/07/21 @ 1640 BY DRT    Culture (A)  Final    METHICILLIN RESISTANT STAPHYLOCOCCUS AUREUS Sent to [place] for further susceptibility testing. Performed at [facility], [place] [address] [place], [place] [address]    Report Status PENDING  Incomplete   Organism ID, Bacteria METHICILLIN RESISTANT STAPHYLOCOCCUS AUREUS  Final      Susceptibility   Methicillin resistant staphylococcus aureus - MIC*    CIPROFLOXACIN >=8 RESISTANT Resistant     ERYTHROMYCIN RESISTANT Resistant     GENTAMICIN <=0.5 SENSITIVE Sensitive     OXACILLIN >=4 RESISTANT Resistant     TETRACYCLINE <=1 SENSITIVE Sensitive     VANCOMYCIN <=0.5 SENSITIVE Sensitive     TRIMETH/SULFA <=10 SENSITIVE Sensitive     CLINDAMYCIN RESISTANT Resistant     RIFAMPIN <=0.5 SENSITIVE Sensitive     Inducible Clindamycin POSITIVE Resistant     * METHICILLIN RESISTANT STAPHYLOCOCCUS AUREUS  Blood Culture ID Panel (Reflexed)     Status: Abnormal   Collection Time: 07/07/21  4:00 AM  Result Value Ref Range Status   Enterococcus faecalis NOT DETECTED NOT DETECTED Final   Enterococcus Faecium NOT DETECTED NOT DETECTED Final   Listeria monocytogenes NOT DETECTED NOT DETECTED Final   Staphylococcus species DETECTED (A) NOT DETECTED Final    Comment: CRITICAL RESULT CALLED TO, READ BACK BY AND VERIFIED WITH: PHARMD EDEN BREWINGTON ON 07/07/21 @ 1640 BY DRT    Staphylococcus aureus (BCID) DETECTED (A) NOT DETECTED Final    Comment: Methicillin (oxacillin)-resistant Staphylococcus aureus (MRSA). MRSA is predictably resistant to beta-lactam antibiotics (except ceftaroline). Preferred therapy is vancomycin unless clinically contraindicated. Patient requires contact precautions if  hospitalized. CRITICAL RESULT CALLED TO, READ BACK BY AND VERIFIED WITH: PHARMD EDEN BREWINGTON ON 07/07/21 @ 1640 BY DRT    Staphylococcus epidermidis NOT DETECTED NOT DETECTED Final   Staphylococcus lugdunensis NOT DETECTED NOT DETECTED  Final   Streptococcus species NOT DETECTED NOT DETECTED Final   Streptococcus agalactiae NOT DETECTED NOT DETECTED Final   Streptococcus pneumoniae NOT DETECTED NOT DETECTED Final   Streptococcus pyogenes NOT DETECTED NOT DETECTED Final   A.calcoaceticus-baumannii NOT DETECTED NOT DETECTED Final   Bacteroides fragilis NOT DETECTED NOT DETECTED Final   Enterobacterales NOT DETECTED NOT DETECTED Final   Enterobacter cloacae complex NOT DETECTED NOT DETECTED Final   Escherichia coli NOT DETECTED NOT DETECTED Final   Klebsiella aerogenes NOT DETECTED NOT DETECTED Final   Klebsiella oxytoca NOT DETECTED NOT DETECTED Final   Klebsiella pneumoniae NOT DETECTED NOT DETECTED Final   Proteus species NOT DETECTED NOT DETECTED Final   Salmonella species NOT DETECTED NOT DETECTED Final   Serratia marcescens NOT DETECTED NOT DETECTED Final   Haemophilus influenzae NOT DETECTED NOT DETECTED Final   Neisseria meningitidis NOT DETECTED NOT DETECTED Final  Pseudomonas aeruginosa NOT DETECTED NOT DETECTED Final   Stenotrophomonas maltophilia NOT DETECTED NOT DETECTED Final   Candida albicans NOT DETECTED NOT DETECTED Final   Candida auris NOT DETECTED NOT DETECTED Final   Candida glabrata NOT DETECTED NOT DETECTED Final   Candida krusei NOT DETECTED NOT DETECTED Final   Candida parapsilosis NOT DETECTED NOT DETECTED Final   Candida tropicalis NOT DETECTED NOT DETECTED Final   Cryptococcus neoformans/gattii NOT DETECTED NOT DETECTED Final   Meth resistant mecA/C and MREJ DETECTED (A) NOT DETECTED Final    Comment: CRITICAL RESULT CALLED TO, READ BACK BY AND VERIFIED WITH: PHARMD EDEN BREWINGTON ON 07/07/21 @ 1640 BY DRT Performed at [facility], [address] [place], [place] [address]   Min Inhibitory Conc (2 Drugs)     Status: None (Preliminary result)   Collection Time: 07/07/21  4:00 AM  Result Value Ref Range Status   Min Inhibitory Conc (2 Drugs) Preliminary report  Final    Comment:  (NOTE) Performed At: [facility] [place], [place] [id number] Rush Farmer MD GQ:[id number]    Source ([place]) PENDING  Incomplete  MIC Results (2 Drugs)     Status: None   Collection Time: 07/07/21  4:00 AM  Result Value Ref Range Status   RESULT 5 MIC (RESULT 1)                Base Name: Staphylococcus aureus  Final    Comment: (NOTE) Identification performed by account, not confirmed by this laboratory. Performed At: [facility] [place], [place] [id number] Rush Farmer MD [id number]   Resp Panel by RT-PCR (Flu A&B, Covid) Nasopharyngeal Swab     Status: None   Collection Time: 07/07/21  4:06 AM   Specimen: Nasopharyngeal Swab; Nasopharyngeal(NP) swabs in vial transport medium  Result Value Ref Range Status   SARS Coronavirus 2 by RT PCR NEGATIVE NEGATIVE Final    Comment: (NOTE) SARS-CoV-2 target nucleic acids are NOT DETECTED.  The SARS-CoV-2 RNA is generally detectable in upper respiratory specimens during the acute phase of infection. The lowest concentration of SARS-CoV-2 viral copies this assay can detect is 138 copies/mL. A negative result does not preclude SARS-Cov-2 infection and should not be used as the sole basis for treatment or other patient management decisions. A negative result may occur with  improper specimen collection/handling, submission of specimen other than nasopharyngeal swab, presence of viral mutation(s) within the areas targeted by this assay, and inadequate number of viral copies(<138 copies/mL). A negative result must be combined with clinical observations, patient history, and epidemiological information. The expected result is Negative.  Fact Sheet for Patients:  EntrepreneurPulse.com.au  Fact Sheet for Healthcare Providers:  IncredibleEmployment.be  This test is no t yet approved or cleared by the [place] FDA and  has been authorized for  detection and/or diagnosis of SARS-CoV-2 by FDA under an Emergency Use Authorization (EUA). This EUA will remain  in effect (meaning this test can be used) for the duration of the COVID-19 declaration under Section 564(b)(1) of the Act, 21 U.S.C.section 360bbb-3(b)(1), unless the authorization is terminated  or revoked sooner.       Influenza A by PCR NEGATIVE NEGATIVE Final   Influenza B by PCR NEGATIVE NEGATIVE Final    Comment: (NOTE) The Xpert Xpress SARS-CoV-2/FLU/RSV plus assay is intended as an aid in the diagnosis of influenza from Nasopharyngeal swab specimens and should not be used as a sole basis for treatment. Nasal washings  and aspirates are unacceptable for Xpert Xpress SARS-CoV-2/FLU/RSV testing.  Fact Sheet for Patients: EntrepreneurPulse.com.au  Fact Sheet for Healthcare Providers: IncredibleEmployment.be  This test is not yet approved or cleared by the [place] FDA and has been authorized for detection and/or diagnosis of SARS-CoV-2 by FDA under an Emergency Use Authorization (EUA). This EUA will remain in effect (meaning this test can be used) for the duration of the COVID-19 declaration under Section 564(b)(1) of the Act, 21 U.S.C. section 360bbb-3(b)(1), unless the authorization is terminated or revoked.  Performed at [facility], [place] [address] [place], [place] [address]   Blood Culture (routine x 2)     Status: Abnormal (Preliminary result)   Collection Time: 07/07/21  4:15 AM   Specimen: BLOOD  Result Value Ref Range Status   Specimen Description BLOOD LEFT ANTECUBITAL  Final   Special Requests   Final    BOTTLES DRAWN AEROBIC AND ANAEROBIC Blood Culture adequate volume   Culture  Setup Time   Final    GRAM POSITIVE COCCI IN CLUSTERS IN BOTH AEROBIC AND ANAEROBIC BOTTLES CRITICAL VALUE NOTED.  VALUE IS CONSISTENT WITH PREVIOUSLY REPORTED AND CALLED VALUE. CRITICAL RESULT CALLED TO, READ BACK BY AND  VERIFIED WITH: PHARMD ERicki Miller [id number] @1729  FH      Culture (A)  Final    STAPHYLOCOCCUS AUREUS SUSCEPTIBILITIES PERFORMED ON PREVIOUS CULTURE WITHIN THE LAST 5 DAYS. Performed at [facility], [place] [address], [place], [place] [address]    Report Status PENDING  Incomplete  Urine Culture     Status: None   Collection Time: 07/07/21  7:42 AM   Specimen: In/Out Cath Urine  Result Value Ref Range Status   Specimen Description IN/OUT CATH URINE  Final   Special Requests NONE  Final   Culture   Final    NO GROWTH Performed at [facility], [place] [address] [place], [place] [address]    Report Status 07/08/2021 FINAL  Final  Culture, blood (Routine X 2) w Reflex to ID Panel     Status: None (Preliminary result)   Collection Time: 07/08/21  9:25 AM   Specimen: BLOOD LEFT HAND  Result Value Ref Range Status   Specimen Description BLOOD LEFT HAND  Final   Special Requests   Final    BOTTLES DRAWN AEROBIC AND ANAEROBIC Blood Culture adequate volume   Culture   Final    NO GROWTH 2 DAYS Performed at [facility], [place] [address], [place], [place] [address]    Report Status PENDING  Incomplete  Culture, blood (Routine X 2) w Reflex to ID Panel     Status: None (Preliminary result)   Collection Time: 07/08/21  9:25 AM   Specimen: BLOOD LEFT HAND  Result Value Ref Range Status   Specimen Description BLOOD LEFT HAND  Final   Special Requests   Final    BOTTLES DRAWN AEROBIC AND ANAEROBIC Blood Culture adequate volume   Culture   Final    NO GROWTH 2 DAYS Performed at [facility], [place] [address], [place], [place] [address]    Report Status PENDING  Incomplete  MRSA Next Gen by PCR, Nasal     Status: Abnormal   Collection Time: 07/08/21  3:07 PM   Specimen: Nasal Mucosa; Nasal Swab  Result Value Ref Range Status   MRSA by PCR Next Gen DETECTED (A) NOT DETECTED Final    Comment: RESULT CALLED TO, READ BACK BY AND  VERIFIED WITH: L MITCHELL,RN@2132  07/08/21  [place] (NOTE) The GeneXpert MRSA Assay (FDA approved for NASAL specimens only), is one component of a comprehensive MRSA colonization surveillance program. It is not intended to diagnose MRSA infection nor to guide or monitor treatment for MRSA infections. Test performance is not FDA approved in patients less than 63 years old. Performed at [facility], [place] [address] [place], [place] [address]      Time coordinating discharge: Over 30 minutes  SIGNED:   Shawna Clamp, MD  [facility] 07/10/2021, 5:02 PM Pager   If 7PM-7AM, please contact night-coverage

## 2021-07-10 NOTE — Progress Notes (Signed)
Per MD, pt likely not ready for dc over weekend. Updated Melissa at [facility] [phone]. Pt is a LTC resident at [facility] and can return when medically stable. SW will follow.   Wandra Feinstein, MSW, LCSW [phone] (coverage)

## 2021-07-13 LAB — CULTURE, BLOOD (ROUTINE X 2)
Culture: NO GROWTH
Culture: NO GROWTH
Special Requests: ADEQUATE
Special Requests: ADEQUATE

## 2021-07-13 LAB — MIC RESULTS (2 DRUGS)

## 2021-07-14 LAB — MIN INHIBITORY CONC (2 DRUGS)

## 2021-07-16 LAB — CULTURE, BLOOD (ROUTINE X 2): Special Requests: ADEQUATE

## 2021-07-16 DEATH — deceased
